# Patient Record
Sex: Female | Born: 1935 | Race: Black or African American | Hispanic: No | Marital: Married | Smoking: Never smoker
Health system: Southern US, Community
[De-identification: ages and names within clinical notes are randomized; demographics above are authoritative.]

## PROBLEM LIST (undated history)

## (undated) DIAGNOSIS — K219 Gastro-esophageal reflux disease without esophagitis: Secondary | ICD-10-CM

## (undated) DIAGNOSIS — N118 Other chronic tubulo-interstitial nephritis: Secondary | ICD-10-CM

## (undated) DIAGNOSIS — D801 Nonfamilial hypogammaglobulinemia: Secondary | ICD-10-CM

## (undated) DIAGNOSIS — Z7901 Long term (current) use of anticoagulants: Secondary | ICD-10-CM

## (undated) DIAGNOSIS — E7800 Pure hypercholesterolemia, unspecified: Secondary | ICD-10-CM

## (undated) DIAGNOSIS — J45909 Unspecified asthma, uncomplicated: Secondary | ICD-10-CM

## (undated) DIAGNOSIS — I10 Essential (primary) hypertension: Secondary | ICD-10-CM

## (undated) DIAGNOSIS — I4891 Unspecified atrial fibrillation: Secondary | ICD-10-CM

## (undated) DIAGNOSIS — Z86010 Personal history of colonic polyps: Secondary | ICD-10-CM

## (undated) HISTORY — PX: ABDOMINAL HYSTERECTOMY: SHX81

## (undated) HISTORY — PX: NEPHRECTOMY: SHX65

## (undated) HISTORY — DX: Other chronic tubulo-interstitial nephritis: N11.8

## (undated) HISTORY — DX: Nonfamilial hypogammaglobulinemia: D80.1

## (undated) HISTORY — DX: Pure hypercholesterolemia, unspecified: E78.00

## (undated) HISTORY — DX: Unspecified asthma, uncomplicated: J45.909

## (undated) HISTORY — DX: Essential (primary) hypertension: I10

## (undated) HISTORY — DX: Personal history of colonic polyps: Z86.010

## (undated) HISTORY — DX: Gastro-esophageal reflux disease without esophagitis: K21.9

## (undated) HISTORY — DX: Unspecified atrial fibrillation: I48.91

---

## 2004-08-29 ENCOUNTER — Ambulatory Visit: Payer: Self-pay | Admitting: Internal Medicine

## 2004-09-07 ENCOUNTER — Inpatient Hospital Stay: Payer: Self-pay | Admitting: Internal Medicine

## 2004-09-17 ENCOUNTER — Ambulatory Visit: Payer: Self-pay | Admitting: Internal Medicine

## 2004-09-19 ENCOUNTER — Ambulatory Visit: Payer: Self-pay | Admitting: Internal Medicine

## 2004-10-02 ENCOUNTER — Ambulatory Visit: Payer: Self-pay | Admitting: Urology

## 2004-10-10 ENCOUNTER — Ambulatory Visit: Payer: Self-pay | Admitting: Urology

## 2004-10-16 ENCOUNTER — Other Ambulatory Visit: Payer: Self-pay

## 2004-10-19 ENCOUNTER — Ambulatory Visit: Payer: Self-pay | Admitting: Internal Medicine

## 2004-10-20 ENCOUNTER — Inpatient Hospital Stay: Payer: Self-pay | Admitting: Urology

## 2004-11-19 ENCOUNTER — Ambulatory Visit: Payer: Self-pay | Admitting: Internal Medicine

## 2004-12-19 ENCOUNTER — Ambulatory Visit: Payer: Self-pay | Admitting: Internal Medicine

## 2005-01-19 ENCOUNTER — Ambulatory Visit: Payer: Self-pay | Admitting: Internal Medicine

## 2005-02-19 ENCOUNTER — Ambulatory Visit: Payer: Self-pay | Admitting: Internal Medicine

## 2005-04-06 ENCOUNTER — Ambulatory Visit: Payer: Self-pay | Admitting: Internal Medicine

## 2005-04-09 ENCOUNTER — Ambulatory Visit: Payer: Self-pay | Admitting: Internal Medicine

## 2005-06-05 ENCOUNTER — Ambulatory Visit: Payer: Self-pay | Admitting: Internal Medicine

## 2005-09-30 ENCOUNTER — Ambulatory Visit: Payer: Self-pay | Admitting: Internal Medicine

## 2005-10-19 ENCOUNTER — Ambulatory Visit: Payer: Self-pay | Admitting: Internal Medicine

## 2006-04-14 ENCOUNTER — Ambulatory Visit: Payer: Self-pay | Admitting: Internal Medicine

## 2007-04-21 ENCOUNTER — Ambulatory Visit: Payer: Self-pay | Admitting: Internal Medicine

## 2008-05-16 ENCOUNTER — Ambulatory Visit: Payer: Self-pay | Admitting: Internal Medicine

## 2009-06-06 ENCOUNTER — Inpatient Hospital Stay: Payer: Self-pay | Admitting: Internal Medicine

## 2009-06-19 ENCOUNTER — Ambulatory Visit: Payer: Self-pay | Admitting: Internal Medicine

## 2009-06-27 ENCOUNTER — Ambulatory Visit: Payer: Self-pay | Admitting: Internal Medicine

## 2009-07-08 ENCOUNTER — Ambulatory Visit: Payer: Self-pay | Admitting: Internal Medicine

## 2009-07-09 ENCOUNTER — Ambulatory Visit: Payer: Self-pay | Admitting: Internal Medicine

## 2009-07-19 ENCOUNTER — Ambulatory Visit: Payer: Self-pay | Admitting: Internal Medicine

## 2009-08-19 ENCOUNTER — Ambulatory Visit: Payer: Self-pay | Admitting: Internal Medicine

## 2009-09-19 ENCOUNTER — Ambulatory Visit: Payer: Self-pay | Admitting: Internal Medicine

## 2009-11-07 ENCOUNTER — Ambulatory Visit: Payer: Self-pay | Admitting: Internal Medicine

## 2009-11-19 ENCOUNTER — Ambulatory Visit: Payer: Self-pay | Admitting: Internal Medicine

## 2010-01-02 ENCOUNTER — Ambulatory Visit: Payer: Self-pay | Admitting: Internal Medicine

## 2010-01-19 ENCOUNTER — Ambulatory Visit: Payer: Self-pay | Admitting: Internal Medicine

## 2010-02-28 ENCOUNTER — Ambulatory Visit: Payer: Self-pay | Admitting: Internal Medicine

## 2010-03-20 ENCOUNTER — Ambulatory Visit: Payer: Self-pay | Admitting: Internal Medicine

## 2010-03-26 ENCOUNTER — Inpatient Hospital Stay: Payer: Self-pay | Admitting: Internal Medicine

## 2010-07-03 ENCOUNTER — Ambulatory Visit: Payer: Self-pay | Admitting: Internal Medicine

## 2010-08-19 ENCOUNTER — Ambulatory Visit: Payer: Self-pay | Admitting: Internal Medicine

## 2010-11-05 ENCOUNTER — Ambulatory Visit: Payer: Self-pay | Admitting: Internal Medicine

## 2011-08-26 ENCOUNTER — Ambulatory Visit: Payer: Self-pay | Admitting: Internal Medicine

## 2011-11-18 ENCOUNTER — Telehealth: Payer: Self-pay | Admitting: Internal Medicine

## 2011-11-18 NOTE — Telephone Encounter (Signed)
Refill request for proair hfa 90 mcg inhaler 8.5 gm eight and five tenths Sig: inhale 2 puffs every 6 hours as needed Patient has an appt. On 12/20

## 2011-11-20 NOTE — Telephone Encounter (Signed)
Dr. Lorin Picket called in to pharmacy

## 2011-11-24 ENCOUNTER — Other Ambulatory Visit: Payer: Self-pay | Admitting: *Deleted

## 2011-11-24 MED ORDER — ALBUTEROL SULFATE HFA 108 (90 BASE) MCG/ACT IN AERS: 2.0000 | INHALATION_SPRAY | Freq: Four times a day (QID) | RESPIRATORY_TRACT | Status: DC | PRN

## 2011-11-26 ENCOUNTER — Other Ambulatory Visit: Payer: Self-pay | Admitting: *Deleted

## 2011-11-26 NOTE — Telephone Encounter (Signed)
Called in Script for [employer] Inhaler/90 mcg per pharmacy

## 2011-12-02 NOTE — Telephone Encounter (Signed)
Encounter opened in error

## 2011-12-25 LAB — PROTIME-INR

## 2012-01-06 ENCOUNTER — Telehealth: Payer: Self-pay | Admitting: Internal Medicine

## 2012-01-06 NOTE — Telephone Encounter (Signed)
Montelukast SOD 10 mg tab Take 1 tablet by mouth every day #30

## 2012-01-08 ENCOUNTER — Ambulatory Visit (INDEPENDENT_AMBULATORY_CARE_PROVIDER_SITE_OTHER): Payer: PRIVATE HEALTH INSURANCE | Admitting: Internal Medicine

## 2012-01-08 ENCOUNTER — Encounter: Payer: Self-pay | Admitting: Internal Medicine

## 2012-01-08 VITALS — BP 148/78 | HR 77 | Temp 97.7°F | Ht 60.0 in | Wt 152.5 lb

## 2012-01-08 DIAGNOSIS — I4891 Unspecified atrial fibrillation: Secondary | ICD-10-CM

## 2012-01-08 DIAGNOSIS — J45909 Unspecified asthma, uncomplicated: Secondary | ICD-10-CM | POA: Insufficient documentation

## 2012-01-08 DIAGNOSIS — N289 Disorder of kidney and ureter, unspecified: Secondary | ICD-10-CM

## 2012-01-08 DIAGNOSIS — D751 Secondary polycythemia: Secondary | ICD-10-CM

## 2012-01-08 DIAGNOSIS — N184 Chronic kidney disease, stage 4 (severe): Secondary | ICD-10-CM | POA: Insufficient documentation

## 2012-01-08 DIAGNOSIS — E010 Iodine-deficiency related diffuse (endemic) goiter: Secondary | ICD-10-CM

## 2012-01-08 DIAGNOSIS — N058 Unspecified nephritic syndrome with other morphologic changes: Secondary | ICD-10-CM

## 2012-01-08 DIAGNOSIS — E1121 Type 2 diabetes mellitus with diabetic nephropathy: Secondary | ICD-10-CM

## 2012-01-08 DIAGNOSIS — E1129 Type 2 diabetes mellitus with other diabetic kidney complication: Secondary | ICD-10-CM

## 2012-01-08 DIAGNOSIS — E7800 Pure hypercholesterolemia, unspecified: Secondary | ICD-10-CM

## 2012-01-08 DIAGNOSIS — I10 Essential (primary) hypertension: Secondary | ICD-10-CM

## 2012-01-08 DIAGNOSIS — D45 Polycythemia vera: Secondary | ICD-10-CM

## 2012-01-08 DIAGNOSIS — E049 Nontoxic goiter, unspecified: Secondary | ICD-10-CM

## 2012-01-08 NOTE — Telephone Encounter (Signed)
Called in to pharmacy. 30 with 2/refills

## 2012-01-08 NOTE — Patient Instructions (Signed)
It was nice seeing you today.  I am glad you have been doing well.  Let me know if you need anything. 

## 2012-01-09 ENCOUNTER — Encounter: Payer: Self-pay | Admitting: Internal Medicine

## 2012-01-09 NOTE — Assessment & Plan Note (Signed)
Appears to be in sinus rhythm today.  Sees Dr Fath.  Currently asymptomatic.  Follow.      

## 2012-01-09 NOTE — Assessment & Plan Note (Signed)
Sugars (per her report) as outlined.  Same meds.  Follow met b and a1c.

## 2012-01-09 NOTE — Assessment & Plan Note (Signed)
Was followed by Dr Yabanez.  Had intermittent phlebotomy.  Follow.   

## 2012-01-09 NOTE — Progress Notes (Signed)
Subjective:    Patient ID: Joan Erickson, female    DOB: 1931/04/30, 78 y.o.   MRN: [id number]  HPI 78 year old female with past history of asthma, hypertension, hyperglycemia, atrial fibrillation and is s/p right nephrectomy for xanthogranulomatous pyelonephritis who comes in today for a scheduled follow up.  She states she has been walking and feels better.  Blood pressure has been doing well.  Just saw nephrology.  Renal function stable.  No recorded sugar readings.  Blood sugar in the am - 130s.  Blood pressures averaging 107-120s/60s.  No cardiac symptoms with increased activity or exertion.  Breathing stable.  Got her flu shot.  Past Medical History  Diagnosis Date  . Hypertension   . Asthma   . GERD (gastroesophageal reflux disease)   . Hypercholesterolemia   . History of colon polyps   . Xanthogranulomatous pyelonephritis     s/p right nephrectomy  . Hypogammaglobulinemia     IgA  . Atrial fibrillation     Current Outpatient Prescriptions on File Prior to Visit  Medication Sig Dispense Refill  . albuterol (PROVENTIL HFA;VENTOLIN HFA) 108 (90 BASE) MCG/ACT inhaler Inhale 2 puffs into the lungs every 6 (six) hours as needed.  8.5 g  1  . chlorthalidone (HYGROTON) 25 MG tablet Take 25 mg by mouth daily.      . digoxin (LANOXIN) 0.125 MG tablet Take 0.125 mg by mouth daily.      Marland Kitchen diltiazem (TIAZAC) 360 MG 24 hr capsule Take 360 mg by mouth daily.      . fluticasone (FLONASE) 50 MCG/ACT nasal spray Place 2 sprays into the nose daily.      Marland Kitchen glimepiride (AMARYL) 2 MG tablet Take 1/2 tablet q day      . losartan (COZAAR) 100 MG tablet Take 100 mg by mouth daily.      . montelukast (SINGULAIR) 10 MG tablet Take 10 mg by mouth at bedtime.      . pravastatin (PRAVACHOL) 20 MG tablet Take 20 mg by mouth daily.      Marland Kitchen warfarin (COUMADIN) 1 MG tablet Take 1 mg by mouth as directed.      Marland Kitchen estradiol (VIVELLE-DOT) 0.05 MG/24HR Place 1 patch onto the skin once a week.      . ferrous sulfate  325 (65 FE) MG tablet Take 325 mg by mouth 2 (two) times daily.      . Fluticasone-Salmeterol (ADVAIR) 250-50 MCG/DOSE AEPB Inhale 1 puff into the lungs every 12 (twelve) hours.      . hydrochlorothiazide (HYDRODIURIL) 25 MG tablet Take 1/2 tablet q day        Review of Systems Patient denies any headache, lightheadedness or dizziness.  No sinus or allergy symptoms.  No chest pain, tightness or palpitations.  No increased shortness of breath, cough or congestion.  No nausea or vomiting.  No abdominal pain or cramping.  No bowel change, such as diarrhea, constipation, BRBPR or melana.  No urine change.        Objective:   Physical Exam Filed Vitals:   01/08/12 0946  BP: 148/78  Pulse: 77  Temp: 97.7 F (41.8 C)   78 year old female in no acute distress.   HEENT:  Nares - clear.  OP- without lesions or erythema.  NECK:  Supple, nontender.  No audible  bruit.   HEART:  Appears to be regular. LUNGS:  Without crackles or wheezing audible.  Respirations even and unlabored.   RADIAL PULSE:  Equal  bilaterally.  ABDOMEN:  Soft, nontender.  No audible abdominal bruit.   EXTREMITIES:  No increased edema to be present.                     Assessment & Plan:  LEFT UPPER EYELID LESION.  Will make an appt with her dermatologist in [facility].    HEALTH MAINTENANCE.  Physical 03/23/11.  She is s/p hysterectomy.  Colonoscopy 07/16/06 revealed sessile polyp that was removed.  Recommended repeat in five years.  Had had mammogram this summer.  Obtain results.

## 2012-01-09 NOTE — Assessment & Plan Note (Signed)
Blood pressure has been doing better.  Same meds.  Follow.   

## 2012-01-09 NOTE — Assessment & Plan Note (Signed)
Breathing stable. Continue inhalers.  Follow.    

## 2012-01-09 NOTE — Assessment & Plan Note (Signed)
Saw Dr Renae Fickle.  Biopsy negative.

## 2012-01-09 NOTE — Assessment & Plan Note (Signed)
On pravastatin.  Low cholesterol diet and exercise.  Follow.  Check lipid panel and liver function.

## 2012-01-09 NOTE — Assessment & Plan Note (Signed)
Creatinine stable at 2.1.  Continues to follow up with nephrology.

## 2012-01-11 ENCOUNTER — Telehealth: Payer: Self-pay | Admitting: Internal Medicine

## 2012-01-11 MED ORDER — PRAVASTATIN SODIUM 20 MG PO TABS: 20.0000 mg | ORAL_TABLET | Freq: Every day | ORAL | Status: DC

## 2012-01-11 NOTE — Telephone Encounter (Signed)
Sent in to pharmacy.  

## 2012-01-11 NOTE — Telephone Encounter (Signed)
Montelukast SOD 10 mg Tablet  Take 1 tablet by mouth every day  # 30

## 2012-01-11 NOTE — Telephone Encounter (Signed)
Pravastatin Sodium 20 mg tab  Take 1 tablet by mouth at bedtime  # 30

## 2012-01-12 MED ORDER — MONTELUKAST SODIUM 10 MG PO TABS: 10.0000 mg | ORAL_TABLET | Freq: Every day | ORAL | Status: DC

## 2012-01-12 NOTE — Telephone Encounter (Signed)
Sent in to pharmacy.  

## 2012-02-16 LAB — PROTIME-INR

## 2012-02-29 ENCOUNTER — Other Ambulatory Visit: Payer: Self-pay | Admitting: *Deleted

## 2012-02-29 MED ORDER — DILTIAZEM HCL ER BEADS 360 MG PO CP24: 360.0000 mg | ORAL_CAPSULE | Freq: Every day | ORAL | Status: DC

## 2012-02-29 NOTE — Telephone Encounter (Signed)
Eprescribed.

## 2012-03-02 ENCOUNTER — Telehealth: Payer: Self-pay | Admitting: Internal Medicine

## 2012-03-02 NOTE — Telephone Encounter (Signed)
Pt would like a call back from Dr. Lorin Picket or nurse regarding going back to [employer] ??

## 2012-03-07 NOTE — Telephone Encounter (Signed)
Left message on answering machine to call back.

## 2012-03-08 NOTE — Telephone Encounter (Signed)
Yes.  I think this sounds like a good idea.

## 2012-03-08 NOTE — Telephone Encounter (Signed)
Patient would like to know if you think it will be all right for her to start going to [employer] to exercise again?

## 2012-03-08 NOTE — Telephone Encounter (Signed)
Pt is calling you back and wanted you to call her

## 2012-03-08 NOTE — Telephone Encounter (Signed)
Patient notified

## 2012-03-16 ENCOUNTER — Other Ambulatory Visit: Payer: Self-pay | Admitting: *Deleted

## 2012-03-16 MED ORDER — GLIMEPIRIDE 2 MG PO TABS: ORAL_TABLET | ORAL | Status: DC

## 2012-03-16 NOTE — Telephone Encounter (Signed)
Sent in to pharmacy.  

## 2012-03-17 ENCOUNTER — Other Ambulatory Visit: Payer: Self-pay | Admitting: *Deleted

## 2012-03-17 MED ORDER — ESTRADIOL 0.05 MG/24HR TD PTTW: 1.0000 | MEDICATED_PATCH | TRANSDERMAL | Status: DC

## 2012-03-17 NOTE — Telephone Encounter (Signed)
Sent in to pharmacy.  

## 2012-03-22 ENCOUNTER — Telehealth: Payer: Self-pay | Admitting: Internal Medicine

## 2012-03-22 MED ORDER — FLUTICASONE PROPIONATE 50 MCG/ACT NA SUSP: 2.0000 | Freq: Every day | NASAL | Status: DC

## 2012-03-22 MED ORDER — ALBUTEROL SULFATE HFA 108 (90 BASE) MCG/ACT IN AERS: 2.0000 | INHALATION_SPRAY | Freq: Four times a day (QID) | RESPIRATORY_TRACT | Status: DC | PRN

## 2012-03-22 NOTE — Telephone Encounter (Signed)
Refills sent in for flonase and proair

## 2012-03-23 ENCOUNTER — Other Ambulatory Visit: Payer: Self-pay | Admitting: *Deleted

## 2012-03-23 MED ORDER — CHLORTHALIDONE 25 MG PO TABS: 25.0000 mg | ORAL_TABLET | Freq: Every day | ORAL | Status: DC

## 2012-03-23 NOTE — Telephone Encounter (Signed)
Sent in to pharmacy.  

## 2012-03-31 ENCOUNTER — Other Ambulatory Visit: Payer: PRIVATE HEALTH INSURANCE

## 2012-04-04 ENCOUNTER — Other Ambulatory Visit (INDEPENDENT_AMBULATORY_CARE_PROVIDER_SITE_OTHER): Payer: PRIVATE HEALTH INSURANCE

## 2012-04-04 DIAGNOSIS — E1121 Type 2 diabetes mellitus with diabetic nephropathy: Secondary | ICD-10-CM

## 2012-04-04 DIAGNOSIS — I4891 Unspecified atrial fibrillation: Secondary | ICD-10-CM

## 2012-04-04 DIAGNOSIS — I10 Essential (primary) hypertension: Secondary | ICD-10-CM

## 2012-04-04 DIAGNOSIS — E010 Iodine-deficiency related diffuse (endemic) goiter: Secondary | ICD-10-CM

## 2012-04-04 DIAGNOSIS — E7800 Pure hypercholesterolemia, unspecified: Secondary | ICD-10-CM

## 2012-04-04 DIAGNOSIS — E1129 Type 2 diabetes mellitus with other diabetic kidney complication: Secondary | ICD-10-CM

## 2012-04-04 LAB — CBC WITH DIFFERENTIAL/PLATELET
Basophils Absolute: 0 10*3/uL (ref 0.0–0.1)
Eosinophils Absolute: 0.1 10*3/uL (ref 0.0–0.7)
HCT: 37 % (ref 36.0–46.0)
Hemoglobin: 12.7 g/dL (ref 12.0–15.0)
Lymphs Abs: 1 10*3/uL (ref 0.7–4.0)
MCHC: 34.2 g/dL (ref 30.0–36.0)
Neutro Abs: 5 10*3/uL (ref 1.4–7.7)
RDW: 13.8 % (ref 11.5–14.6)

## 2012-04-04 LAB — BASIC METABOLIC PANEL
BUN: 43 mg/dL — ABNORMAL HIGH (ref 6–23)
CO2: 26 mEq/L (ref 19–32)
Calcium: 9.5 mg/dL (ref 8.4–10.5)
Glucose, Bld: 238 mg/dL — ABNORMAL HIGH (ref 70–99)
Potassium: 4.4 mEq/L (ref 3.5–5.1)
Sodium: 134 mEq/L — ABNORMAL LOW (ref 135–145)

## 2012-04-04 LAB — HEPATIC FUNCTION PANEL
ALT: 24 U/L (ref 0–35)
Bilirubin, Direct: 0.1 mg/dL (ref 0.0–0.3)
Total Bilirubin: 0.5 mg/dL (ref 0.3–1.2)

## 2012-04-04 LAB — HEMOGLOBIN A1C: Hgb A1c MFr Bld: 9.4 % — ABNORMAL HIGH (ref 4.6–6.5)

## 2012-04-04 LAB — TSH: TSH: 2.68 u[IU]/mL (ref 0.35–5.50)

## 2012-04-06 ENCOUNTER — Encounter: Payer: Self-pay | Admitting: Internal Medicine

## 2012-04-07 ENCOUNTER — Ambulatory Visit (INDEPENDENT_AMBULATORY_CARE_PROVIDER_SITE_OTHER): Payer: Medicare Other | Admitting: Internal Medicine

## 2012-04-07 ENCOUNTER — Encounter: Payer: Self-pay | Admitting: Internal Medicine

## 2012-04-07 VITALS — BP 170/70 | HR 81 | Temp 98.0°F | Ht 60.0 in | Wt 155.8 lb

## 2012-04-07 DIAGNOSIS — Z1211 Encounter for screening for malignant neoplasm of colon: Secondary | ICD-10-CM

## 2012-04-07 DIAGNOSIS — E1121 Type 2 diabetes mellitus with diabetic nephropathy: Secondary | ICD-10-CM

## 2012-04-07 DIAGNOSIS — J45909 Unspecified asthma, uncomplicated: Secondary | ICD-10-CM

## 2012-04-07 DIAGNOSIS — D751 Secondary polycythemia: Secondary | ICD-10-CM

## 2012-04-07 DIAGNOSIS — E010 Iodine-deficiency related diffuse (endemic) goiter: Secondary | ICD-10-CM

## 2012-04-07 DIAGNOSIS — L989 Disorder of the skin and subcutaneous tissue, unspecified: Secondary | ICD-10-CM

## 2012-04-07 DIAGNOSIS — N289 Disorder of kidney and ureter, unspecified: Secondary | ICD-10-CM

## 2012-04-07 DIAGNOSIS — E7800 Pure hypercholesterolemia, unspecified: Secondary | ICD-10-CM

## 2012-04-07 DIAGNOSIS — I10 Essential (primary) hypertension: Secondary | ICD-10-CM

## 2012-04-07 DIAGNOSIS — I4891 Unspecified atrial fibrillation: Secondary | ICD-10-CM

## 2012-04-07 DIAGNOSIS — E871 Hypo-osmolality and hyponatremia: Secondary | ICD-10-CM

## 2012-04-07 LAB — BASIC METABOLIC PANEL
CO2: 27 mEq/L (ref 19–32)
Calcium: 9.7 mg/dL (ref 8.4–10.5)
Chloride: 103 mEq/L (ref 96–112)
Creatinine, Ser: 2.6 mg/dL — ABNORMAL HIGH (ref 0.4–1.2)
Sodium: 136 mEq/L (ref 135–145)

## 2012-04-07 MED ORDER — GLIMEPIRIDE 2 MG PO TABS: 2.0000 mg | ORAL_TABLET | Freq: Every day | ORAL | Status: DC

## 2012-04-07 NOTE — Progress Notes (Signed)
Subjective:    Patient ID: Joan Erickson, female    DOB: 1932/01/19, 79 y.o.   MRN: [id number]  HPI 79 year old female with past history of asthma, hypertension, hyperglycemia, atrial fibrillation and is s/p right nephrectomy for xanthogranulomatous pyelonephritis who comes in today to follow up on these issues as well as for a complete physical exam.  She states she is doing well and feels good.  She has not been exercising regularly and has not bee watching her diet.  Plans to start exercising more.  Plans to return to [employer].  Also plans to start watching her diet better.  Her husband is doing better.  Stress is better.  Still following with nephrology.  Has follow up planned 4/14.  Has not been checking her sugars regularly.  A1c just checked - 9.4.  On amaryl and this was previously controlling.  No problems with lows.  Good appetite.  Had follow up with Dr Lady Gary 03/28/12.  States everything checked out fine.  Due follow up in 6 months.     Past Medical History  Diagnosis Date  . Hypertension   . Asthma   . GERD (gastroesophageal reflux disease)   . Hypercholesterolemia   . History of colon polyps   . Xanthogranulomatous pyelonephritis     s/p right nephrectomy  . Hypogammaglobulinemia     IgA  . Atrial fibrillation     Current Outpatient Prescriptions on File Prior to Visit  Medication Sig Dispense Refill  . albuterol (PROVENTIL HFA;VENTOLIN HFA) 108 (90 BASE) MCG/ACT inhaler Inhale 2 puffs into the lungs every 6 (six) hours as needed for wheezing.  1 Inhaler  5  . chlorthalidone (HYGROTON) 25 MG tablet Take 1 tablet (25 mg total) by mouth daily.  30 tablet  5  . Cholecalciferol (VITAMIN D) 2000 UNITS tablet Take 2,000 Units by mouth daily.      . digoxin (LANOXIN) 0.125 MG tablet Take 0.125 mg by mouth daily.      Marland Kitchen diltiazem (TIAZAC) 360 MG 24 hr capsule Take 1 capsule (360 mg total) by mouth daily.  30 capsule  5  . estradiol (VIVELLE-DOT) 0.05 MG/24HR Place 1 patch (0.05 mg total)  onto the skin once a week.  8 patch  1  . fluticasone (FLONASE) 50 MCG/ACT nasal spray Place 2 sprays into the nose daily.  16 g  5  . Fluticasone-Salmeterol (ADVAIR) 250-50 MCG/DOSE AEPB Inhale 1 puff into the lungs every 12 (twelve) hours.      Marland Kitchen glimepiride (AMARYL) 2 MG tablet Take 1/2 tablet q day  15 tablet  5  . losartan (COZAAR) 100 MG tablet Take 100 mg by mouth daily.      . montelukast (SINGULAIR) 10 MG tablet Take 1 tablet (10 mg total) by mouth at bedtime.  30 tablet  5  . pravastatin (PRAVACHOL) 20 MG tablet Take 1 tablet (20 mg total) by mouth daily.  30 tablet  2  . warfarin (COUMADIN) 1 MG tablet Take 1 mg by mouth as directed.      . warfarin (COUMADIN) 5 MG tablet As directed      . ferrous sulfate 325 (65 FE) MG tablet Take 325 mg by mouth 2 (two) times daily.      . hydrochlorothiazide (HYDRODIURIL) 25 MG tablet Take 1/2 tablet q day       No current facility-administered medications on file prior to visit.    Review of Systems Patient denies any headache, lightheadedness or dizziness.  No  sinus or allergy symptoms.  No chest pain, tightness or palpitations.  No increased heart rate.  No increased shortness of breath, cough or congestion.  She feels her breathing is doing well.  No nausea or vomiting.  No abdominal pain or cramping.  No bowel change, such as diarrhea, constipation, BRBPR or melana.  No urine change.        Objective:   Physical Exam  Filed Vitals:   04/07/12 0857  BP: 170/70  Pulse: 81  Temp: 98 F (38.34 C)   79 year old female in no acute distress.   HEENT:  Nares- clear.  Oropharynx - without lesions. NECK:  Supple.  Nontender.  No audible bruit.  HEART:  Appears to be regular. LUNGS:  No crackles or wheezing audible.  Respirations even and unlabored.  RADIAL PULSE:  Equal bilaterally.    BREASTS:  No nipple discharge or nipple retraction present.  Could not appreciate any distinct nodules or axillary adenopathy.  ABDOMEN:  Soft, nontender.   Bowel sounds present and normal.  No audible abdominal bruit.  GU:  She declined.     EXTREMITIES:  No increased edema present.  DP pulses palpable and equal bilaterally.  SKIN:  No lesions - feet.  Persistent eye lesion and facial lesion.             Assessment & Plan:  DERM.  Persistent eye lesion and facial lesion.  Refer to dermatology for evaluation.    HEALTH MAINTENANCE.  Physical today.  She is s/p hysterectomy.  She declined pelvic. Colonoscopy 07/16/06 revealed sessile polyp that was removed.  Recommended repeat in five years.  Overdue.  Schedule an appt with GI.   Mammogram 08/26/11 - BiRADS I.   Schedule a follow up mammogram when due.

## 2012-04-10 ENCOUNTER — Encounter: Payer: Self-pay | Admitting: Internal Medicine

## 2012-04-10 NOTE — Assessment & Plan Note (Signed)
Breathing stable. Continue inhalers.  Follow.    

## 2012-04-10 NOTE — Assessment & Plan Note (Signed)
Not checking sugars.  Not watching her diet and exercising.  Plans to start exercising again and plans to get more serious about her diet.  Increase amaryl to 2mg  q day.  Check and record sugars at least bid.  Get her back in soon to reassess.  Follow.

## 2012-04-10 NOTE — Assessment & Plan Note (Signed)
Appears to be in sinus rhythm today.  Sees Dr Fath.  Currently asymptomatic.  Follow.      

## 2012-04-10 NOTE — Assessment & Plan Note (Signed)
Was followed by Dr Yabanez.  Had intermittent phlebotomy.  Follow.   

## 2012-04-10 NOTE — Assessment & Plan Note (Signed)
Creatinine had been stable at 2.1.  A little more elevated with the latest labs.  Recheck.  Continues to follow up with nephrology.

## 2012-04-10 NOTE — Assessment & Plan Note (Signed)
On pravastatin.  Low cholesterol diet and exercise.  Follow.  Follow lipid panel and liver function.    

## 2012-04-10 NOTE — Assessment & Plan Note (Signed)
Blood pressure has been doing better.  Initially elevated today.  On recheck improved, but still elevated.  (146/68 on recheck).  Same meds.  Follow.  Have her spot check her pressure and get her back in soon to reassess.

## 2012-04-10 NOTE — Assessment & Plan Note (Signed)
Saw Dr Paul.  Biopsy negative.  Was last evaluated by Dr Paul - 07/02/11.  Had follow up ultrasound.  Had recommended a one year follow up if everything stable.         

## 2012-04-15 ENCOUNTER — Other Ambulatory Visit: Payer: Self-pay | Admitting: *Deleted

## 2012-04-15 MED ORDER — PRAVASTATIN SODIUM 20 MG PO TABS: 20.0000 mg | ORAL_TABLET | Freq: Every day | ORAL | Status: DC

## 2012-04-15 NOTE — Telephone Encounter (Signed)
Rx sent in to pharmacy. 

## 2012-05-03 ENCOUNTER — Encounter: Payer: Self-pay | Admitting: Internal Medicine

## 2012-05-03 ENCOUNTER — Ambulatory Visit (INDEPENDENT_AMBULATORY_CARE_PROVIDER_SITE_OTHER): Payer: Medicare Other | Admitting: Internal Medicine

## 2012-05-03 VITALS — BP 150/90 | HR 83 | Temp 98.2°F | Wt 154.5 lb

## 2012-05-03 DIAGNOSIS — J45909 Unspecified asthma, uncomplicated: Secondary | ICD-10-CM

## 2012-05-03 DIAGNOSIS — E1129 Type 2 diabetes mellitus with other diabetic kidney complication: Secondary | ICD-10-CM

## 2012-05-03 DIAGNOSIS — E049 Nontoxic goiter, unspecified: Secondary | ICD-10-CM

## 2012-05-03 DIAGNOSIS — E7800 Pure hypercholesterolemia, unspecified: Secondary | ICD-10-CM

## 2012-05-03 DIAGNOSIS — D45 Polycythemia vera: Secondary | ICD-10-CM

## 2012-05-03 DIAGNOSIS — N289 Disorder of kidney and ureter, unspecified: Secondary | ICD-10-CM

## 2012-05-03 DIAGNOSIS — D751 Secondary polycythemia: Secondary | ICD-10-CM

## 2012-05-03 DIAGNOSIS — I4891 Unspecified atrial fibrillation: Secondary | ICD-10-CM

## 2012-05-03 DIAGNOSIS — E010 Iodine-deficiency related diffuse (endemic) goiter: Secondary | ICD-10-CM

## 2012-05-03 DIAGNOSIS — I10 Essential (primary) hypertension: Secondary | ICD-10-CM

## 2012-05-03 DIAGNOSIS — E1121 Type 2 diabetes mellitus with diabetic nephropathy: Secondary | ICD-10-CM

## 2012-05-03 DIAGNOSIS — N058 Unspecified nephritic syndrome with other morphologic changes: Secondary | ICD-10-CM

## 2012-05-08 ENCOUNTER — Encounter: Payer: Self-pay | Admitting: Internal Medicine

## 2012-05-08 NOTE — Assessment & Plan Note (Signed)
Blood pressure has been doing better.  Same meds.  Follow.  Have her spot check her pressure and get her back in soon to reassess.         

## 2012-05-08 NOTE — Assessment & Plan Note (Signed)
Appears to be in sinus rhythm today.  Sees Dr Fath.  Currently asymptomatic.  Follow.      

## 2012-05-08 NOTE — Assessment & Plan Note (Signed)
Breathing stable. Continue inhalers.  Follow.    

## 2012-05-08 NOTE — Assessment & Plan Note (Signed)
Saw Dr Paul.  Biopsy negative.  Was last evaluated by Dr Paul - 07/02/11.  Had follow up ultrasound.  Had recommended a one year follow up if everything stable.         

## 2012-05-08 NOTE — Assessment & Plan Note (Signed)
Was followed by Dr Yabanez.  Had intermittent phlebotomy.  Follow.   

## 2012-05-08 NOTE — Progress Notes (Signed)
Subjective:    Patient ID: Joan Erickson, female    DOB: 1931-11-04, 79 y.o.   MRN: [id number]  HPI 79 year old female with past history of asthma, hypertension, hyperglycemia, atrial fibrillation and is s/p right nephrectomy for xanthogranulomatous pyelonephritis who comes in today for a scheduled follow up.   She states she is doing well and feels good.  She is now back to exercising regularly.  Had not been watching what she eats.  Has started again monitoring.  Back at [employer].  Her husband is doing better.  Stress is better.  Still following with nephrology.  Has follow up planned later this month.  Has not been checking her sugars regularly.  Brought in no recorded readings.   A1c just checked - 9.4.  On amaryl and this was previously controlling.  No problems with lows.  Good appetite.  Had follow up with Dr Lady Gary 03/28/12.  States everything checked out fine.  Due follow up in 6 months.     Past Medical History  Diagnosis Date  . Hypertension   . Asthma   . GERD (gastroesophageal reflux disease)   . Hypercholesterolemia   . History of colon polyps   . Xanthogranulomatous pyelonephritis     s/p right nephrectomy  . Hypogammaglobulinemia     IgA  . Atrial fibrillation     Current Outpatient Prescriptions on File Prior to Visit  Medication Sig Dispense Refill  . albuterol (PROVENTIL HFA;VENTOLIN HFA) 108 (90 BASE) MCG/ACT inhaler Inhale 2 puffs into the lungs every 6 (six) hours as needed for wheezing.  1 Inhaler  5  . chlorthalidone (HYGROTON) 25 MG tablet Take 1 tablet (25 mg total) by mouth daily.  30 tablet  5  . Cholecalciferol (VITAMIN D) 2000 UNITS tablet Take 2,000 Units by mouth daily.      . digoxin (LANOXIN) 0.125 MG tablet Take 0.125 mg by mouth daily.      Marland Kitchen diltiazem (TIAZAC) 360 MG 24 hr capsule Take 1 capsule (360 mg total) by mouth daily.  30 capsule  5  . estradiol (VIVELLE-DOT) 0.05 MG/24HR Place 1 patch (0.05 mg total) onto the skin once a week.  8 patch  1  .  ferrous sulfate 325 (65 FE) MG tablet Take 325 mg by mouth 2 (two) times daily.      . fluticasone (FLONASE) 50 MCG/ACT nasal spray Place 2 sprays into the nose daily.  16 g  5  . Fluticasone-Salmeterol (ADVAIR) 250-50 MCG/DOSE AEPB Inhale 1 puff into the lungs every 12 (twelve) hours.      Marland Kitchen glimepiride (AMARYL) 2 MG tablet Take 1 tablet (2 mg total) by mouth daily before breakfast.  30 tablet  5  . hydrochlorothiazide (HYDRODIURIL) 25 MG tablet Take 1/2 tablet q day      . losartan (COZAAR) 100 MG tablet Take 100 mg by mouth daily.      . montelukast (SINGULAIR) 10 MG tablet Take 1 tablet (10 mg total) by mouth at bedtime.  30 tablet  5  . pravastatin (PRAVACHOL) 20 MG tablet Take 1 tablet (20 mg total) by mouth daily.  30 tablet  5  . warfarin (COUMADIN) 1 MG tablet Take 1 mg by mouth as directed.      . warfarin (COUMADIN) 5 MG tablet As directed       No current facility-administered medications on file prior to visit.    Review of Systems Patient denies any headache, lightheadedness or dizziness.  No sinus or allergy  symptoms.  No chest pain, tightness or palpitations.  No increased heart rate.  No increased shortness of breath, cough or congestion.  She feels her breathing is doing well.  No nausea or vomiting.  No abdominal pain or cramping.  No bowel change, such as diarrhea, constipation, BRBPR or melana.  No urine change.  Brought in no recorded sugar readings.  Has started back exercising.      Objective:   Physical Exam  Filed Vitals:   05/03/12 0813  BP: 150/90  Pulse: 83  Temp: 98.2 F (73.70 C)   79 year old female in no acute distress.   HEENT:  Nares- clear.  Oropharynx - without lesions. NECK:  Supple.  Nontender.  No audible bruit.  HEART:  Appears to be regular. LUNGS:  No crackles or wheezing audible.  Respirations even and unlabored.  RADIAL PULSE:  Equal bilaterally.  ABDOMEN:  Soft, nontender.  Bowel sounds present and normal.  No audible abdominal bruit.      EXTREMITIES:  No increased edema present.  DP pulses palpable and equal bilaterally.  SKIN:  No lesions - feet.  Persistent eye lesion and facial lesion.             Assessment & Plan:  HEALTH MAINTENANCE.  Physical last visit.  She is s/p hysterectomy.  Colonoscopy 07/16/06 revealed sessile polyp that was removed.  Recommended repeat in five years.  Overdue.  Scheduled an appt with GI.   Mammogram 08/26/11 - BiRADS I.   Schedule a follow up mammogram when due.

## 2012-05-08 NOTE — Assessment & Plan Note (Signed)
On pravastatin.  Low cholesterol diet and exercise.  Follow.  Follow lipid panel and liver function.    

## 2012-05-08 NOTE — Assessment & Plan Note (Signed)
Creatinine had been stable at 2.1.  A little more elevated with the latest labs.  Last Cr 2.6.  Labs forwarded to nephrology.  Has follow up 05/16/12.  Avoid nephrotoxic agents.

## 2012-05-08 NOTE — Assessment & Plan Note (Addendum)
Brought in no recorded sugar readings.   Not watching her diet.  Back to exercising now.  Increased amaryl to 2mg  q day last visit.  Check and record sugars at least bid.  Get her back in soon to reassess.  Follow.  Just saw her optometrist.  Apparently had diabetic changes.  Continue follow up with optometry.

## 2012-05-09 ENCOUNTER — Telehealth: Payer: Self-pay | Admitting: Internal Medicine

## 2012-05-09 MED ORDER — DILTIAZEM HCL ER BEADS 360 MG PO CP24: 360.0000 mg | ORAL_CAPSULE | Freq: Every day | ORAL | Status: DC

## 2012-05-09 NOTE — Telephone Encounter (Signed)
Order sent in for diltiazem refill x 6

## 2012-05-11 ENCOUNTER — Other Ambulatory Visit: Payer: Self-pay | Admitting: Internal Medicine

## 2012-05-11 NOTE — Telephone Encounter (Signed)
Rx sent to pharmacy by escript  

## 2012-05-17 ENCOUNTER — Telehealth: Payer: Self-pay | Admitting: Internal Medicine

## 2012-05-17 MED ORDER — GLUCOSE BLOOD VI STRP: ORAL_STRIP | Status: DC

## 2012-05-17 NOTE — Telephone Encounter (Signed)
Pt called she needs rx for contour test strips [facility] [phone] Pt out of test strips

## 2012-05-17 NOTE — Telephone Encounter (Signed)
Contour test strips sent electronically to pharmacy ([facility])

## 2012-05-19 ENCOUNTER — Telehealth: Payer: Self-pay | Admitting: *Deleted

## 2012-05-19 NOTE — Telephone Encounter (Signed)
Pt wanted to Dr.Scott an update. She went to [facility] about her left "wet eye"-and it was taking care of the same day with a minor procedure. Doing much better now. Her weight is down & her sugar is better. She also has a colonoscopy scheduled for 5/8, but would like to know if she could have it done at [facility] instead. That's where she usually has them done (cant remember the doctors name right now)

## 2012-05-20 NOTE — Telephone Encounter (Signed)
Reviewed message.  Can keep [place] posted about her eye.  Regarding the colonoscopy, I am ok for her to have the colonoscopy wherever she prefers.  If she decides to go to [facility], she needs to get [place] the name or number to contact.

## 2012-05-20 NOTE — Telephone Encounter (Signed)
Spoke with pt & she will call [facility] and she will contact [facility] to find out the name of the doctor & contact number and give [place] a call back.

## 2012-06-01 ENCOUNTER — Ambulatory Visit: Payer: Medicare Other | Admitting: Internal Medicine

## 2012-06-02 ENCOUNTER — Telehealth: Payer: Self-pay | Admitting: Internal Medicine

## 2012-06-02 NOTE — Telephone Encounter (Signed)
Pt stated she had her last colonscopy with Dr Boyce Medici [facility] [phone] [place]. [place], [place] [address]

## 2012-06-03 NOTE — Telephone Encounter (Signed)
LMTCB

## 2012-06-03 NOTE — Telephone Encounter (Signed)
Pt called with this information.  Is she wanting me to make an appt with this physician for a f/u colonoscopy.  According to the chart, she saw GI here in April (I think).  Just let me know what she wants to do and I will schedule.  Thanks.

## 2012-06-06 ENCOUNTER — Ambulatory Visit (INDEPENDENT_AMBULATORY_CARE_PROVIDER_SITE_OTHER): Payer: Medicare Other | Admitting: Internal Medicine

## 2012-06-06 ENCOUNTER — Encounter: Payer: Self-pay | Admitting: Internal Medicine

## 2012-06-06 VITALS — BP 138/80 | HR 78 | Temp 99.1°F | Ht 60.0 in | Wt 151.2 lb

## 2012-06-06 DIAGNOSIS — E1129 Type 2 diabetes mellitus with other diabetic kidney complication: Secondary | ICD-10-CM

## 2012-06-06 DIAGNOSIS — E1121 Type 2 diabetes mellitus with diabetic nephropathy: Secondary | ICD-10-CM

## 2012-06-06 DIAGNOSIS — I4891 Unspecified atrial fibrillation: Secondary | ICD-10-CM

## 2012-06-06 DIAGNOSIS — Z1211 Encounter for screening for malignant neoplasm of colon: Secondary | ICD-10-CM

## 2012-06-06 DIAGNOSIS — E7800 Pure hypercholesterolemia, unspecified: Secondary | ICD-10-CM

## 2012-06-06 DIAGNOSIS — D45 Polycythemia vera: Secondary | ICD-10-CM

## 2012-06-06 DIAGNOSIS — E049 Nontoxic goiter, unspecified: Secondary | ICD-10-CM

## 2012-06-06 DIAGNOSIS — E119 Type 2 diabetes mellitus without complications: Secondary | ICD-10-CM

## 2012-06-06 DIAGNOSIS — I10 Essential (primary) hypertension: Secondary | ICD-10-CM

## 2012-06-06 DIAGNOSIS — N058 Unspecified nephritic syndrome with other morphologic changes: Secondary | ICD-10-CM

## 2012-06-06 DIAGNOSIS — D751 Secondary polycythemia: Secondary | ICD-10-CM

## 2012-06-06 DIAGNOSIS — J45909 Unspecified asthma, uncomplicated: Secondary | ICD-10-CM

## 2012-06-06 DIAGNOSIS — N289 Disorder of kidney and ureter, unspecified: Secondary | ICD-10-CM

## 2012-06-06 DIAGNOSIS — E010 Iodine-deficiency related diffuse (endemic) goiter: Secondary | ICD-10-CM

## 2012-06-07 ENCOUNTER — Other Ambulatory Visit: Payer: Self-pay | Admitting: Internal Medicine

## 2012-06-13 ENCOUNTER — Encounter: Payer: Self-pay | Admitting: Internal Medicine

## 2012-06-13 NOTE — Assessment & Plan Note (Signed)
Was followed by Dr Yabanez.  Had intermittent phlebotomy.  Follow.   

## 2012-06-13 NOTE — Assessment & Plan Note (Signed)
Blood pressure has been doing better.  Same meds.  Follow.  Have her spot check her pressure and get her back in soon to reassess.

## 2012-06-13 NOTE — Assessment & Plan Note (Signed)
Appears to be in sinus rhythm today.  Sees Dr Fath.  Currently asymptomatic.  Follow.      

## 2012-06-13 NOTE — Progress Notes (Signed)
Subjective:    Patient ID: Joan Erickson, female    DOB: 07-20-1931, 79 y.o.   MRN: [id number]  HPI 79 year old female with past history of asthma, hypertension, hyperglycemia, atrial fibrillation and is s/p right nephrectomy for xanthogranulomatous pyelonephritis who comes in today for a scheduled follow up.   She states she is doing well and feels good.  She is now back to exercising regularly.  Had not been watching what she eats.  Has started again monitoring.  Back at [employer].  Her husband is doing better.  Stress is better.  Still following with nephrology.  She is checking her sugars some now.  Sugars varying.  Pm readings better - averaging 120-150.  Am sugars elevated - averaging 130-160s.  A1c last checked - 9.4.  Sugars have improved since last a1c check.  On amaryl and this was previously controlling.  No problems with lows.  Good appetite.  Had follow up with Dr Lady Gary 03/28/12.  States everything checked out fine.  Due follow up in 6 months.  Had an eye procedure.  She continues to f/u with opthalmology.     Past Medical History  Diagnosis Date  . Hypertension   . Asthma   . GERD (gastroesophageal reflux disease)   . Hypercholesterolemia   . History of colon polyps   . Xanthogranulomatous pyelonephritis     s/p right nephrectomy  . Hypogammaglobulinemia     IgA  . Atrial fibrillation     Current Outpatient Prescriptions on File Prior to Visit  Medication Sig Dispense Refill  . albuterol (PROVENTIL HFA;VENTOLIN HFA) 108 (90 BASE) MCG/ACT inhaler Inhale 2 puffs into the lungs every 6 (six) hours as needed for wheezing.  1 Inhaler  5  . chlorthalidone (HYGROTON) 25 MG tablet Take 1 tablet (25 mg total) by mouth daily.  30 tablet  5  . Cholecalciferol (VITAMIN D) 2000 UNITS tablet Take 2,000 Units by mouth daily.      . digoxin (LANOXIN) 0.125 MG tablet Take 0.125 mg by mouth daily.      Marland Kitchen diltiazem (TIAZAC) 360 MG 24 hr capsule Take 1 capsule (360 mg total) by mouth daily.  30  capsule  5  . estradiol (VIVELLE-DOT) 0.05 MG/24HR Place 1 patch (0.05 mg total) onto the skin once a week.  8 patch  1  . ferrous sulfate 325 (65 FE) MG tablet Take 325 mg by mouth 2 (two) times daily.      . fluticasone (FLONASE) 50 MCG/ACT nasal spray Place 2 sprays into the nose daily.  16 g  5  . glimepiride (AMARYL) 2 MG tablet Take 1 tablet (2 mg total) by mouth daily before breakfast.  30 tablet  5  . glucose blood test strip Use as instructed  100 each  5  . hydrochlorothiazide (HYDRODIURIL) 25 MG tablet Take 1/2 tablet q day      . losartan (COZAAR) 100 MG tablet Take 100 mg by mouth daily.      . montelukast (SINGULAIR) 10 MG tablet TAKE 1 TABLET EVERY DAY  30 tablet  5  . pravastatin (PRAVACHOL) 20 MG tablet Take 1 tablet (20 mg total) by mouth daily.  30 tablet  5  . warfarin (COUMADIN) 1 MG tablet Take 1 mg by mouth as directed.      . warfarin (COUMADIN) 5 MG tablet As directed       No current facility-administered medications on file prior to visit.    Review of Systems Patient denies  any headache, lightheadedness or dizziness.  No sinus or allergy symptoms.  Had eye surgery.  Left eye doing better.  No chest pain, tightness or palpitations.  No increased heart rate.  No increased shortness of breath, cough or congestion.  She feels her breathing is doing well.  No nausea or vomiting.  No abdominal pain or cramping.  No bowel change, such as diarrhea, constipation, BRBPR or melana.  No urine change.  Sugar readings as outlined.  Have improved.   Has started back exercising.      Objective:   Physical Exam  Filed Vitals:   06/06/12 1041  BP: 138/80  Pulse: 78  Temp: 99.1 F (17.72 C)   79 year old female in no acute distress.   HEENT:  Nares- clear.  Oropharynx - without lesions. NECK:  Supple.  Nontender.  No audible bruit.  HEART:  Appears to be regular. LUNGS:  No crackles or wheezing audible.  Respirations even and unlabored.  RADIAL PULSE:  Equal bilaterally.   ABDOMEN:  Soft, nontender.  Bowel sounds present and normal.  No audible abdominal bruit.     EXTREMITIES:  No increased edema present.  DP pulses palpable and equal bilaterally.  SKIN:  No lesions - feet.  Persistent eye lesion and facial lesion.             Assessment & Plan:  HEALTH MAINTENANCE.  Physical 04/07/12.  She is s/p hysterectomy.  Colonoscopy 07/16/06 revealed sessile polyp that was removed.  Recommended repeat in five years.  Overdue.  Schedule an appt with GI.   Mammogram 08/26/11 - BiRADS I.   Schedule a follow up mammogram when due.

## 2012-06-13 NOTE — Assessment & Plan Note (Signed)
On pravastatin.  Low cholesterol diet and exercise.  Follow.  Follow lipid panel and liver function.    

## 2012-06-13 NOTE — Assessment & Plan Note (Signed)
Sugars improving.  She is doing better watching what she eats.  Exercising.   Just saw her optometrist.  Apparently had diabetic changes.  Continue follow up with opthalmology.  Continue current med regimen.  Follow.  Get her back in soon to reassess.  Check metabolic panel and a1c.  Continues to f/u with nephrology.

## 2012-06-13 NOTE — Assessment & Plan Note (Signed)
Breathing stable. Continue inhalers.  Follow.    

## 2012-06-13 NOTE — Assessment & Plan Note (Signed)
Creatinine had been stable at 2.1.  A little more elevated with the latest labs.  Last Cr 2.6.  Labs forwarded to nephrology.  Had follow up 05/16/12.  Avoid nephrotoxic agents.  Follow renal function.

## 2012-06-13 NOTE — Assessment & Plan Note (Signed)
Saw Dr Renae Fickle.  Biopsy negative.  Was last evaluated by Dr Renae Fickle - 07/02/11.  Had follow up ultrasound.  Had recommended a one year follow up if everything stable.

## 2012-06-15 ENCOUNTER — Telehealth: Payer: Self-pay | Admitting: *Deleted

## 2012-06-15 NOTE — Telephone Encounter (Signed)
Received a letter that [employer] company will no longer cover the [employer]. Called pt to inform her & she states that she no longer uses them. Dr. Lorin Picket has taken her off of them.

## 2012-06-21 ENCOUNTER — Encounter: Payer: Self-pay | Admitting: Internal Medicine

## 2012-06-21 ENCOUNTER — Ambulatory Visit (INDEPENDENT_AMBULATORY_CARE_PROVIDER_SITE_OTHER): Payer: Medicare Other | Admitting: Internal Medicine

## 2012-06-21 ENCOUNTER — Telehealth: Payer: Self-pay | Admitting: Internal Medicine

## 2012-06-21 VITALS — BP 130/60 | HR 80 | Temp 98.8°F | Ht 60.0 in | Wt 152.2 lb

## 2012-06-21 DIAGNOSIS — N058 Unspecified nephritic syndrome with other morphologic changes: Secondary | ICD-10-CM

## 2012-06-21 DIAGNOSIS — E1129 Type 2 diabetes mellitus with other diabetic kidney complication: Secondary | ICD-10-CM

## 2012-06-21 DIAGNOSIS — I10 Essential (primary) hypertension: Secondary | ICD-10-CM

## 2012-06-21 DIAGNOSIS — E1121 Type 2 diabetes mellitus with diabetic nephropathy: Secondary | ICD-10-CM

## 2012-06-21 DIAGNOSIS — N289 Disorder of kidney and ureter, unspecified: Secondary | ICD-10-CM

## 2012-06-21 MED ORDER — GLIPIZIDE ER 5 MG PO TB24: 5.0000 mg | ORAL_TABLET | Freq: Every day | ORAL | Status: DC

## 2012-06-21 NOTE — Patient Instructions (Signed)
Stop the amaryl (glimepiride) and start glipizide 5mg .  Take one per day.

## 2012-06-21 NOTE — Telephone Encounter (Signed)
Left message for pt to call office.  Dr Lorin Picket wanted to know if pt could come in today @ 4:15   Please confirm

## 2012-06-21 NOTE — Telephone Encounter (Signed)
Pt aware of appointment 

## 2012-06-23 ENCOUNTER — Encounter: Payer: Self-pay | Admitting: Internal Medicine

## 2012-06-23 NOTE — Progress Notes (Signed)
Subjective:    Patient ID: Joan Erickson, female    DOB: 1931/10/30, 79 y.o.   MRN: [id number]  HPI 79 year old female with past history of asthma, hypertension, hyperglycemia, atrial fibrillation and is s/p right nephrectomy for xanthogranulomatous pyelonephritis who comes in today for a scheduled follow up.   She states she is doing well and feels good.  She is now back to exercising regularly. Back at [employer].  Her husband is doing better.  Stress is better.  Still following with nephrology.  She is checking her sugars some now.  Pm readings better - averaging 140s..  Am sugars elevated - averaging 130s.  Sugars have improved since last a1c check.  Has been on amaryl.  Saw nephrology.  They recommended changing to glipzide.  She is in to discuss this change.     Past Medical History  Diagnosis Date  . Hypertension   . Asthma   . GERD (gastroesophageal reflux disease)   . Hypercholesterolemia   . History of colon polyps   . Xanthogranulomatous pyelonephritis     s/p right nephrectomy  . Hypogammaglobulinemia     IgA  . Atrial fibrillation     Current Outpatient Prescriptions on File Prior to Visit  Medication Sig Dispense Refill  . albuterol (PROVENTIL HFA;VENTOLIN HFA) 108 (90 BASE) MCG/ACT inhaler Inhale 2 puffs into the lungs every 6 (six) hours as needed for wheezing.  1 Inhaler  5  . chlorthalidone (HYGROTON) 25 MG tablet Take 1 tablet (25 mg total) by mouth daily.  30 tablet  5  . Cholecalciferol (VITAMIN D) 2000 UNITS tablet Take 2,000 Units by mouth daily.      . digoxin (LANOXIN) 0.125 MG tablet Take 0.125 mg by mouth daily.      Marland Kitchen diltiazem (TIAZAC) 360 MG 24 hr capsule Take 1 capsule (360 mg total) by mouth daily.  30 capsule  5  . estradiol (VIVELLE-DOT) 0.05 MG/24HR Place 1 patch (0.05 mg total) onto the skin once a week.  8 patch  1  . ferrous sulfate 325 (65 FE) MG tablet Take 325 mg by mouth 2 (two) times daily.      . fluticasone (FLONASE) 50 MCG/ACT nasal spray Place 2  sprays into the nose daily.  16 g  5  . Fluticasone-Salmeterol (ADVAIR) 250-50 MCG/DOSE AEPB INHALE 1 PUFF TWICE A DAY  60 each  5  . glucose blood test strip Use as instructed  100 each  5  . hydrochlorothiazide (HYDRODIURIL) 25 MG tablet Take 1/2 tablet q day      . losartan (COZAAR) 100 MG tablet Take 100 mg by mouth daily.      . montelukast (SINGULAIR) 10 MG tablet TAKE 1 TABLET EVERY DAY  30 tablet  5  . pravastatin (PRAVACHOL) 20 MG tablet Take 1 tablet (20 mg total) by mouth daily.  30 tablet  5  . warfarin (COUMADIN) 1 MG tablet Take 1 mg by mouth as directed.      . warfarin (COUMADIN) 5 MG tablet As directed       No current facility-administered medications on file prior to visit.    Review of Systems Patient denies any headache, lightheadedness or dizziness.  No sinus or allergy symptoms.  No chest pain, tightness or palpitations.  No increased heart rate.  No increased shortness of breath, cough or congestion.  She feels her breathing is doing well.  No nausea or vomiting.  No abdominal pain or cramping.  No bowel change,  such as diarrhea, constipation, BRBPR or melana.  No urine change.  Sugar readings as outlined.  Have improved.   Has started back exercising.  Feels good.      Objective:   Physical Exam  Filed Vitals:   06/21/12 1638  BP: 130/60  Pulse: 80  Temp: 98.8 F (75.28 C)   79 year old female in no acute distress.   HEENT:  Nares- clear.  Oropharynx - without lesions. NECK:  Supple.  Nontender.  No audible bruit.  HEART:  Appears to be regular. LUNGS:  No crackles or wheezing audible.  Respirations even and unlabored.  RADIAL PULSE:  Equal bilaterally.  ABDOMEN:  Soft, nontender.  Bowel sounds present and normal.  No audible abdominal bruit.     EXTREMITIES:  No increased edema present.  DP pulses palpable and equal bilaterally.             Assessment & Plan:  HEALTH MAINTENANCE.  Physical 04/07/12.  She is s/p hysterectomy.  Colonoscopy 07/16/06 revealed  sessile polyp that was removed.  Recommended repeat in five years.  Overdue.  Scheduled an appt with GI.   Mammogram 08/26/11 - BiRADS I.   Scheduled for a follow up mammogram when due.

## 2012-06-23 NOTE — Assessment & Plan Note (Signed)
Blood pressure has been doing better.  Same meds.  Follow.   

## 2012-06-23 NOTE — Assessment & Plan Note (Signed)
Sugars better.  Will stop the amaryl.  Start Glipizide XL 5mg  q day.  Follow sugars.  Monitor for lows.  Follow.

## 2012-06-23 NOTE — Assessment & Plan Note (Signed)
Creatinine had been stable at 2.1.  A little more elevated with the latest labs.  Last Cr 2.6.  Labs forwarded to nephrology.  Had follow up 05/16/12.  Avoid nephrotoxic agents.  Follow renal function.        

## 2012-06-27 ENCOUNTER — Other Ambulatory Visit: Payer: Self-pay | Admitting: Internal Medicine

## 2012-06-30 ENCOUNTER — Other Ambulatory Visit: Payer: Self-pay | Admitting: Internal Medicine

## 2012-06-30 DIAGNOSIS — I4891 Unspecified atrial fibrillation: Secondary | ICD-10-CM

## 2012-06-30 NOTE — Progress Notes (Signed)
Order placed for f/u digoxin level with next labs.

## 2012-07-13 ENCOUNTER — Other Ambulatory Visit (INDEPENDENT_AMBULATORY_CARE_PROVIDER_SITE_OTHER): Payer: Medicare Other

## 2012-07-13 DIAGNOSIS — I4891 Unspecified atrial fibrillation: Secondary | ICD-10-CM

## 2012-07-13 DIAGNOSIS — E7800 Pure hypercholesterolemia, unspecified: Secondary | ICD-10-CM

## 2012-07-13 DIAGNOSIS — E119 Type 2 diabetes mellitus without complications: Secondary | ICD-10-CM

## 2012-07-13 LAB — BASIC METABOLIC PANEL
Chloride: 99 mEq/L (ref 96–112)
GFR: 23.59 mL/min — ABNORMAL LOW (ref >60.00)
Glucose, Bld: 279 mg/dL — ABNORMAL HIGH (ref 70–99)
Potassium: 4.7 mEq/L (ref 3.5–5.1)
Sodium: 133 mEq/L — ABNORMAL LOW (ref 135–145)

## 2012-07-13 LAB — LIPID PANEL
HDL: 56.8 mg/dL (ref >39.00)
Total CHOL/HDL Ratio: 3

## 2012-07-13 LAB — HEPATIC FUNCTION PANEL
AST: 24 U/L (ref 0–37)
Alkaline Phosphatase: 54 U/L (ref 39–117)
Total Bilirubin: 0.5 mg/dL (ref 0.3–1.2)

## 2012-07-13 LAB — HEMOGLOBIN A1C: Hgb A1c MFr Bld: 7.9 % — ABNORMAL HIGH (ref 4.6–6.5)

## 2012-07-14 LAB — DIGOXIN LEVEL: Digoxin Level: 2 ng/mL (ref 0.8–2.0)

## 2012-07-18 ENCOUNTER — Ambulatory Visit: Payer: Medicare Other | Admitting: Internal Medicine

## 2012-07-23 ENCOUNTER — Other Ambulatory Visit: Payer: Self-pay | Admitting: Internal Medicine

## 2012-07-23 DIAGNOSIS — I4891 Unspecified atrial fibrillation: Secondary | ICD-10-CM

## 2012-07-23 DIAGNOSIS — E871 Hypo-osmolality and hyponatremia: Secondary | ICD-10-CM

## 2012-07-23 NOTE — Progress Notes (Signed)
Order placed for f/u labs.  

## 2012-07-25 ENCOUNTER — Ambulatory Visit: Payer: Medicare Other | Admitting: Internal Medicine

## 2012-07-26 ENCOUNTER — Other Ambulatory Visit: Payer: Self-pay | Admitting: *Deleted

## 2012-07-26 MED ORDER — DIGOXIN 125 MCG PO TABS: 0.1250 mg | ORAL_TABLET | ORAL | Status: DC

## 2012-08-01 ENCOUNTER — Ambulatory Visit (INDEPENDENT_AMBULATORY_CARE_PROVIDER_SITE_OTHER): Payer: Medicare Other | Admitting: Internal Medicine

## 2012-08-01 ENCOUNTER — Encounter: Payer: Self-pay | Admitting: Internal Medicine

## 2012-08-01 VITALS — BP 140/70 | HR 81 | Temp 98.4°F | Ht 60.0 in | Wt 153.2 lb

## 2012-08-01 DIAGNOSIS — N058 Unspecified nephritic syndrome with other morphologic changes: Secondary | ICD-10-CM

## 2012-08-01 DIAGNOSIS — I10 Essential (primary) hypertension: Secondary | ICD-10-CM

## 2012-08-01 DIAGNOSIS — I4891 Unspecified atrial fibrillation: Secondary | ICD-10-CM

## 2012-08-01 DIAGNOSIS — E1129 Type 2 diabetes mellitus with other diabetic kidney complication: Secondary | ICD-10-CM

## 2012-08-01 DIAGNOSIS — N289 Disorder of kidney and ureter, unspecified: Secondary | ICD-10-CM

## 2012-08-01 DIAGNOSIS — E1121 Type 2 diabetes mellitus with diabetic nephropathy: Secondary | ICD-10-CM

## 2012-08-01 DIAGNOSIS — D751 Secondary polycythemia: Secondary | ICD-10-CM

## 2012-08-01 DIAGNOSIS — D45 Polycythemia vera: Secondary | ICD-10-CM

## 2012-08-01 DIAGNOSIS — E010 Iodine-deficiency related diffuse (endemic) goiter: Secondary | ICD-10-CM

## 2012-08-01 DIAGNOSIS — E049 Nontoxic goiter, unspecified: Secondary | ICD-10-CM

## 2012-08-01 DIAGNOSIS — E7800 Pure hypercholesterolemia, unspecified: Secondary | ICD-10-CM

## 2012-08-01 DIAGNOSIS — E871 Hypo-osmolality and hyponatremia: Secondary | ICD-10-CM

## 2012-08-01 NOTE — Patient Instructions (Addendum)
Change the digoxin to every other day.

## 2012-08-02 ENCOUNTER — Other Ambulatory Visit: Payer: Self-pay | Admitting: Internal Medicine

## 2012-08-02 DIAGNOSIS — I4891 Unspecified atrial fibrillation: Secondary | ICD-10-CM

## 2012-08-02 NOTE — Progress Notes (Signed)
Order placed for f/u digoxin level.   

## 2012-08-03 ENCOUNTER — Encounter: Payer: Self-pay | Admitting: Internal Medicine

## 2012-08-03 NOTE — Assessment & Plan Note (Signed)
Blood pressure has been doing better.  Same meds.  Follow.   

## 2012-08-03 NOTE — Assessment & Plan Note (Signed)
Creatinine had been stable at 2.1.  A little more elevated with the latest labs.  Latest Cr 2.5 - 2.6.  Labs forwarded to nephrology.   Avoid nephrotoxic agents.  Follow renal function.

## 2012-08-03 NOTE — Progress Notes (Signed)
Subjective:    Patient ID: Joan Erickson, female    DOB: 13-Feb-1931, 79 y.o.   MRN: [id number]  HPI 79 year old female with past history of asthma, hypertension, hyperglycemia, atrial fibrillation and is s/p right nephrectomy for xanthogranulomatous pyelonephritis who comes in today for a scheduled follow up.   She states she is doing well and feels good.  She is exercising. Back at [employer].  Her husband is doing better.  Stress is better.  Still following with nephrology.  She is checking her sugars some now.  Pm readings averaging 150s..  Am sugars averaging 130 - 140s.  Recent digoxin level 2.0.  She was instructed to change her digoxin to qod dosing.       Past Medical History  Diagnosis Date  . Hypertension   . Asthma   . GERD (gastroesophageal reflux disease)   . Hypercholesterolemia   . History of colon polyps   . Xanthogranulomatous pyelonephritis     s/p right nephrectomy  . Hypogammaglobulinemia     IgA  . Atrial fibrillation     Current Outpatient Prescriptions on File Prior to Visit  Medication Sig Dispense Refill  . albuterol (PROVENTIL HFA;VENTOLIN HFA) 108 (90 BASE) MCG/ACT inhaler Inhale 2 puffs into the lungs every 6 (six) hours as needed for wheezing.  1 Inhaler  5  . chlorthalidone (HYGROTON) 25 MG tablet Take 1 tablet (25 mg total) by mouth daily.  30 tablet  5  . Cholecalciferol (VITAMIN D) 2000 UNITS tablet Take 2,000 Units by mouth daily.      . digoxin (LANOXIN) 0.125 MG tablet Take 1 tablet (0.125 mg total) by mouth every other day.  15 tablet  5  . diltiazem (TIAZAC) 360 MG 24 hr capsule Take 1 capsule (360 mg total) by mouth daily.  30 capsule  5  . estradiol (VIVELLE-DOT) 0.05 MG/24HR Place 1 patch (0.05 mg total) onto the skin once a week.  8 patch  1  . ferrous sulfate 325 (65 FE) MG tablet Take 325 mg by mouth 2 (two) times daily.      . fluticasone (FLONASE) 50 MCG/ACT nasal spray Place 2 sprays into the nose daily.  16 g  5  . Fluticasone-Salmeterol  (ADVAIR) 250-50 MCG/DOSE AEPB INHALE 1 PUFF TWICE A DAY  60 each  5  . glipiZIDE (GLIPIZIDE XL) 5 MG 24 hr tablet Take 1 tablet (5 mg total) by mouth daily. Do not take with amaryl.  Stop amaryl  30 tablet  2  . glucose blood test strip Use as instructed  100 each  5  . hydrochlorothiazide (HYDRODIURIL) 25 MG tablet Take 1/2 tablet q day      . losartan (COZAAR) 100 MG tablet Take 100 mg by mouth daily.      . montelukast (SINGULAIR) 10 MG tablet TAKE 1 TABLET EVERY DAY  30 tablet  5  . pravastatin (PRAVACHOL) 20 MG tablet Take 1 tablet (20 mg total) by mouth daily.  30 tablet  5  . warfarin (COUMADIN) 1 MG tablet Take 1 mg by mouth as directed.      . warfarin (COUMADIN) 5 MG tablet As directed       No current facility-administered medications on file prior to visit.    Review of Systems Patient denies any headache, lightheadedness or dizziness.  No sinus or allergy symptoms.  No chest pain, tightness or palpitations.  No increased heart rate.  No increased shortness of breath, cough or congestion.  She feels  her breathing is doing well.  No nausea or vomiting.  No abdominal pain or cramping.  No bowel change, such as diarrhea, constipation, BRBPR or melana.  No urine change.  Sugar readings as outlined.  Have improved.   Has started back exercising.  Feels good.      Objective:   Physical Exam  Filed Vitals:   08/01/12 1012  BP: 140/70  Pulse: 81  Temp: 98.4 F (36.9 C)   Blood pressure recheck:  140/74, pulse 61  79 year old female in no acute distress.   HEENT:  Nares- clear.  Oropharynx - without lesions. NECK:  Supple.  Nontender.  No audible bruit.  HEART:  Appears to be regular. LUNGS:  No crackles or wheezing audible.  Respirations even and unlabored.  RADIAL PULSE:  Equal bilaterally.  ABDOMEN:  Soft, nontender.  Bowel sounds present and normal.  No audible abdominal bruit.     EXTREMITIES:  No increased edema present.  DP pulses palpable and equal bilaterally.              Assessment & Plan:  HYPONATREMIA.  Recheck sodium.    HEALTH MAINTENANCE.  Physical 04/07/12.  She is s/p hysterectomy.  Colonoscopy 07/16/06 revealed sessile polyp that was removed.  Recommended repeat in five years.  Overdue.  She has an appt scheduled with GI.  Mammogram 08/26/11 - BiRADS I.   Scheduled for a follow up mammogram when due.

## 2012-08-03 NOTE — Assessment & Plan Note (Signed)
On pravastatin.  Low cholesterol diet and exercise.  Follow.  Follow lipid panel and liver function.    

## 2012-08-03 NOTE — Assessment & Plan Note (Signed)
Appears to be in sinus rhythm today.  Sees Dr Lady Gary.  Currently asymptomatic.  Follow.  Decrease digoxin to qod.  Follow levels.

## 2012-08-03 NOTE — Assessment & Plan Note (Signed)
Saw Dr Renae Fickle.  Biopsy negative.  Was last evaluated by Dr Renae Fickle - 07/02/11.  Had follow up ultrasound.  Had recommended a one year follow up if everything stable.

## 2012-08-03 NOTE — Assessment & Plan Note (Signed)
Sugars better.  On Glipizide XL 5mg  q day.  Follow sugars.  Monitor for lows.  Follow.

## 2012-08-03 NOTE — Assessment & Plan Note (Signed)
Was followed by Dr Yabanez.  Had intermittent phlebotomy.  Follow.   

## 2012-08-03 NOTE — Assessment & Plan Note (Signed)
Breathing stable. Continue inhalers.  Follow.    

## 2012-08-24 ENCOUNTER — Encounter: Payer: Self-pay | Admitting: Internal Medicine

## 2012-08-24 LAB — HM COLONOSCOPY

## 2012-08-30 ENCOUNTER — Other Ambulatory Visit: Payer: Self-pay | Admitting: Internal Medicine

## 2012-08-30 ENCOUNTER — Other Ambulatory Visit (INDEPENDENT_AMBULATORY_CARE_PROVIDER_SITE_OTHER): Payer: Medicare Other

## 2012-08-30 DIAGNOSIS — I4891 Unspecified atrial fibrillation: Secondary | ICD-10-CM

## 2012-08-31 LAB — DIGOXIN LEVEL: Digoxin Level: 0.7 ng/mL — ABNORMAL LOW (ref 0.8–2.0)

## 2012-09-02 ENCOUNTER — Encounter: Payer: Self-pay | Admitting: *Deleted

## 2012-09-18 ENCOUNTER — Other Ambulatory Visit: Payer: Self-pay | Admitting: Internal Medicine

## 2012-09-20 ENCOUNTER — Other Ambulatory Visit: Payer: Self-pay | Admitting: Internal Medicine

## 2012-09-20 ENCOUNTER — Other Ambulatory Visit: Payer: Self-pay | Admitting: *Deleted

## 2012-09-21 MED ORDER — CHLORTHALIDONE 25 MG PO TABS: 25.0000 mg | ORAL_TABLET | Freq: Every day | ORAL | Status: DC

## 2012-10-05 ENCOUNTER — Ambulatory Visit (INDEPENDENT_AMBULATORY_CARE_PROVIDER_SITE_OTHER): Payer: Medicare Other | Admitting: Internal Medicine

## 2012-10-05 ENCOUNTER — Encounter: Payer: Self-pay | Admitting: Internal Medicine

## 2012-10-05 VITALS — BP 138/90 | HR 87 | Temp 98.8°F | Ht 60.0 in | Wt 152.5 lb

## 2012-10-05 DIAGNOSIS — I10 Essential (primary) hypertension: Secondary | ICD-10-CM

## 2012-10-05 DIAGNOSIS — N058 Unspecified nephritic syndrome with other morphologic changes: Secondary | ICD-10-CM

## 2012-10-05 DIAGNOSIS — E1121 Type 2 diabetes mellitus with diabetic nephropathy: Secondary | ICD-10-CM

## 2012-10-05 DIAGNOSIS — E7800 Pure hypercholesterolemia, unspecified: Secondary | ICD-10-CM

## 2012-10-05 DIAGNOSIS — E1129 Type 2 diabetes mellitus with other diabetic kidney complication: Secondary | ICD-10-CM

## 2012-10-05 DIAGNOSIS — D45 Polycythemia vera: Secondary | ICD-10-CM

## 2012-10-05 DIAGNOSIS — N289 Disorder of kidney and ureter, unspecified: Secondary | ICD-10-CM

## 2012-10-05 DIAGNOSIS — D751 Secondary polycythemia: Secondary | ICD-10-CM

## 2012-10-05 DIAGNOSIS — J45909 Unspecified asthma, uncomplicated: Secondary | ICD-10-CM

## 2012-10-05 DIAGNOSIS — E049 Nontoxic goiter, unspecified: Secondary | ICD-10-CM

## 2012-10-05 DIAGNOSIS — E010 Iodine-deficiency related diffuse (endemic) goiter: Secondary | ICD-10-CM

## 2012-10-05 DIAGNOSIS — Z23 Encounter for immunization: Secondary | ICD-10-CM

## 2012-10-05 DIAGNOSIS — I4891 Unspecified atrial fibrillation: Secondary | ICD-10-CM

## 2012-10-05 NOTE — Patient Instructions (Addendum)
Align - one per day 

## 2012-10-08 ENCOUNTER — Encounter: Payer: Self-pay | Admitting: Internal Medicine

## 2012-10-08 NOTE — Assessment & Plan Note (Signed)
Was followed by Dr Yabanez.  Had intermittent phlebotomy.  Follow.   

## 2012-10-08 NOTE — Assessment & Plan Note (Signed)
Creatinine had been stable at 2.1.  A little more elevated with the latest labs.  Latest Cr 2.5 - 2.6.  Labs forwarded to nephrology.   Avoid nephrotoxic agents.  Follow renal function.   

## 2012-10-08 NOTE — Assessment & Plan Note (Signed)
Breathing stable. Continue inhalers.  Follow.    

## 2012-10-08 NOTE — Assessment & Plan Note (Signed)
On pravastatin.  Low cholesterol diet and exercise.  Follow.  Follow lipid panel and liver function.    

## 2012-10-08 NOTE — Assessment & Plan Note (Signed)
Appears to be in sinus rhythm today.  Sees Dr Lady Gary.  Currently asymptomatic.  Follow.  Follow digoxin levels.  Just made adjustment.

## 2012-10-08 NOTE — Assessment & Plan Note (Signed)
Saw Dr Paul.  Biopsy negative.  Was last evaluated by Dr Paul - 07/02/11.  Had follow up ultrasound.  Had recommended a one year follow up if everything stable.         

## 2012-10-08 NOTE — Assessment & Plan Note (Signed)
Sugars better.  On Glipizide XL 5mg q day.  Follow sugars.  Monitor for lows.  Follow.     

## 2012-10-08 NOTE — Progress Notes (Signed)
Subjective:    Patient ID: Joan Erickson, female    DOB: [birth year], 79 y.o.   MRN: [id number]  HPI 79 year old female with past history of asthma, hypertension, hyperglycemia, atrial fibrillation and is s/p right nephrectomy for xanthogranulomatous pyelonephritis who comes in today for a scheduled follow up.   She states she is doing well and feels good.  She is exercising.  Her husband is doing better.  Stress is better.  Still following with nephrology.  She is checking her sugars some now.  Pm readings averaging 160s.    Am sugars averaging 130 - 150s.  Saw opthalmology last week.  Blood pressure doing well.        Past Medical History  Diagnosis Date  . Hypertension   . Asthma   . GERD (gastroesophageal reflux disease)   . Hypercholesterolemia   . History of colon polyps   . Xanthogranulomatous pyelonephritis     s/p right nephrectomy  . Hypogammaglobulinemia     IgA  . Atrial fibrillation     Current Outpatient Prescriptions on File Prior to Visit  Medication Sig Dispense Refill  . ADVAIR DISKUS 250-50 MCG/DOSE AEPB INHALE 1 PUFF TWICE A DAY  60 each  5  . albuterol (PROVENTIL HFA;VENTOLIN HFA) 108 (90 BASE) MCG/ACT inhaler Inhale 2 puffs into the lungs every 6 (six) hours as needed for wheezing.  1 Inhaler  5  . chlorthalidone (HYGROTON) 25 MG tablet Take 1 tablet (25 mg total) by mouth daily.  30 tablet  5  . Cholecalciferol (VITAMIN D) 2000 UNITS tablet Take 2,000 Units by mouth daily.      . digoxin (LANOXIN) 0.125 MG tablet Take 1 tablet (0.125 mg total) by mouth every other day.  15 tablet  5  . diltiazem (TIAZAC) 360 MG 24 hr capsule Take 1 capsule (360 mg total) by mouth daily.  30 capsule  5  . estradiol (VIVELLE-DOT) 0.05 MG/24HR Place 1 patch (0.05 mg total) onto the skin once a week.  8 patch  1  . ferrous sulfate 325 (65 FE) MG tablet Take 325 mg by mouth 2 (two) times daily.      . fluticasone (FLONASE) 50 MCG/ACT nasal spray Place 2 sprays into the nose daily.  16 g   5  . glipiZIDE (GLUCOTROL XL) 5 MG 24 hr tablet TAKE 1 TABLET (5 MG TOTAL) BY MOUTH DAILY. DO NOT TAKE WITH AMARYL. STOP AMARYL  30 tablet  2  . glucose blood test strip Use as instructed  100 each  5  . hydrochlorothiazide (HYDRODIURIL) 25 MG tablet Take 1/2 tablet q day      . losartan (COZAAR) 100 MG tablet TAKE 1 TABLET BY MOUTH ONCE A DAY  30 tablet  5  . montelukast (SINGULAIR) 10 MG tablet TAKE 1 TABLET EVERY DAY  30 tablet  5  . pravastatin (PRAVACHOL) 20 MG tablet Take 1 tablet (20 mg total) by mouth daily.  30 tablet  5  . warfarin (COUMADIN) 1 MG tablet Take 1 mg by mouth as directed.      . warfarin (COUMADIN) 5 MG tablet As directed       No current facility-administered medications on file prior to visit.    Review of Systems Patient denies any headache, lightheadedness or dizziness.  No sinus or allergy symptoms.  No chest pain, tightness or palpitations.  No increased heart rate.  No increased shortness of breath, cough or congestion.  She feels her breathing is doing  well.  No nausea or vomiting.  No abdominal pain or cramping.  No bowel change, such as diarrhea, constipation, BRBPR or melana.  No urine change.  Sugar readings as outlined.  Have improved.   Has started back exercising.  Feels good.      Objective:   Physical Exam  Filed Vitals:   10/05/12 0929  BP: 138/90  Pulse: 87  Temp: 98.8 F (37.1 C)   Blood pressure recheck:  142/82, pulse 39  79 year old female in no acute distress.   HEENT:  Nares- clear.  Oropharynx - without lesions. NECK:  Supple.  Nontender.  No audible bruit.  HEART:  Appears to be regular. LUNGS:  No crackles or wheezing audible.  Respirations even and unlabored.  RADIAL PULSE:  Equal bilaterally.  ABDOMEN:  Soft, nontender.  Bowel sounds present and normal.  No audible abdominal bruit.     EXTREMITIES:  No increased edema present.  DP pulses palpable and equal bilaterally. FEET: without lesions.              Assessment & Plan:   HYPONATREMIA.  Follow sodium.    HEALTH MAINTENANCE.  Physical 04/07/12.  She is s/p hysterectomy.  Colonoscopy 07/16/06 revealed sessile polyp that was removed.  Recommended repeat in five years.  Just had follow up colonoscopy that revealed multiple polyps.  Has follow up planned 10/28/12.  Mammogram 08/26/11 - BiRADS I.   Information given to schedule a follow up mammogram.

## 2012-10-08 NOTE — Assessment & Plan Note (Signed)
Blood pressure has been doing better.  Same meds.  Follow.   

## 2012-10-14 ENCOUNTER — Other Ambulatory Visit: Payer: Self-pay | Admitting: Internal Medicine

## 2012-10-20 ENCOUNTER — Other Ambulatory Visit: Payer: Self-pay | Admitting: *Deleted

## 2012-10-20 MED ORDER — PRAVASTATIN SODIUM 20 MG PO TABS: 20.0000 mg | ORAL_TABLET | Freq: Every day | ORAL | Status: DC

## 2012-10-21 ENCOUNTER — Other Ambulatory Visit (INDEPENDENT_AMBULATORY_CARE_PROVIDER_SITE_OTHER): Payer: Medicare Other

## 2012-10-21 DIAGNOSIS — E1121 Type 2 diabetes mellitus with diabetic nephropathy: Secondary | ICD-10-CM

## 2012-10-21 DIAGNOSIS — N058 Unspecified nephritic syndrome with other morphologic changes: Secondary | ICD-10-CM

## 2012-10-21 DIAGNOSIS — E7800 Pure hypercholesterolemia, unspecified: Secondary | ICD-10-CM

## 2012-10-21 DIAGNOSIS — E1129 Type 2 diabetes mellitus with other diabetic kidney complication: Secondary | ICD-10-CM

## 2012-10-21 LAB — HEMOGLOBIN A1C: Hgb A1c MFr Bld: 8.4 % — ABNORMAL HIGH (ref 4.6–6.5)

## 2012-10-21 LAB — LIPID PANEL
Cholesterol: 161 mg/dL (ref 0–200)
HDL: 52.8 mg/dL (ref >39.00)
LDL Cholesterol: 71 mg/dL (ref 0–99)
Total CHOL/HDL Ratio: 3
Triglycerides: 185 mg/dL — ABNORMAL HIGH (ref 0.0–149.0)

## 2012-10-21 LAB — BASIC METABOLIC PANEL
CO2: 27 mEq/L (ref 19–32)
Chloride: 103 mEq/L (ref 96–112)
Creatinine, Ser: 2.7 mg/dL — ABNORMAL HIGH (ref 0.4–1.2)
Glucose, Bld: 150 mg/dL — ABNORMAL HIGH (ref 70–99)
Sodium: 136 mEq/L (ref 135–145)

## 2012-10-21 LAB — HEPATIC FUNCTION PANEL
Albumin: 3.1 g/dL — ABNORMAL LOW (ref 3.5–5.2)
Total Bilirubin: 0.4 mg/dL (ref 0.3–1.2)

## 2012-10-25 ENCOUNTER — Other Ambulatory Visit: Payer: Self-pay | Admitting: Internal Medicine

## 2012-10-25 DIAGNOSIS — E119 Type 2 diabetes mellitus without complications: Secondary | ICD-10-CM

## 2012-10-25 LAB — HM DIABETES EYE EXAM

## 2012-10-25 NOTE — Progress Notes (Signed)
Order placed for referral to [facility].  

## 2012-10-27 ENCOUNTER — Telehealth: Payer: Self-pay | Admitting: *Deleted

## 2012-10-27 NOTE — Telephone Encounter (Signed)
I had sent you a referral for [facility].  It looks like the referral got sent to [facility].  Please make referral to [facility] - for nutrition ed.  Thanks.

## 2012-10-27 NOTE — Telephone Encounter (Signed)
Pt was calling to give you the name of the medication that her Kidney doctor has put her on. Tradjenta 5mg -pt wanted you to give her a call also (I have updated her med list)

## 2012-10-31 NOTE — Telephone Encounter (Signed)
thanks

## 2012-10-31 NOTE — Telephone Encounter (Signed)
Referral was sent to [facility], I forgot to change the POS. They are on a turn around time of about 2 weeks to call pts.

## 2012-11-15 ENCOUNTER — Telehealth: Payer: Self-pay | Admitting: *Deleted

## 2012-11-15 NOTE — Telephone Encounter (Signed)
Pt is coming in for lab tomorrow 10.29.2014 what labs and dx?

## 2012-11-16 ENCOUNTER — Other Ambulatory Visit: Payer: Medicare Other

## 2012-11-16 NOTE — Patient Instructions (Signed)
Pt didn't need any labs Per Dr.scott

## 2012-11-16 NOTE — Telephone Encounter (Signed)
She just had labs drawn on 10/20/12.  I am not aware of any labs needed.  I am not sure if her nephrologist ordered labs.  Let me know if she shows up for appt.  ( can check digoxin but I do not see other labs needed).  Question if nephrologist wanted met b.

## 2012-12-01 ENCOUNTER — Ambulatory Visit (INDEPENDENT_AMBULATORY_CARE_PROVIDER_SITE_OTHER): Payer: Medicare Other | Admitting: Internal Medicine

## 2012-12-01 ENCOUNTER — Encounter: Payer: Self-pay | Admitting: Internal Medicine

## 2012-12-01 VITALS — BP 140/90 | HR 96 | Temp 98.6°F | Ht 60.0 in | Wt 154.2 lb

## 2012-12-01 DIAGNOSIS — E7800 Pure hypercholesterolemia, unspecified: Secondary | ICD-10-CM

## 2012-12-01 DIAGNOSIS — D751 Secondary polycythemia: Secondary | ICD-10-CM

## 2012-12-01 DIAGNOSIS — E049 Nontoxic goiter, unspecified: Secondary | ICD-10-CM

## 2012-12-01 DIAGNOSIS — Z860100 Personal history of colon polyps, unspecified: Secondary | ICD-10-CM

## 2012-12-01 DIAGNOSIS — D45 Polycythemia vera: Secondary | ICD-10-CM

## 2012-12-01 DIAGNOSIS — I4891 Unspecified atrial fibrillation: Secondary | ICD-10-CM

## 2012-12-01 DIAGNOSIS — N289 Disorder of kidney and ureter, unspecified: Secondary | ICD-10-CM

## 2012-12-01 DIAGNOSIS — N058 Unspecified nephritic syndrome with other morphologic changes: Secondary | ICD-10-CM

## 2012-12-01 DIAGNOSIS — E1129 Type 2 diabetes mellitus with other diabetic kidney complication: Secondary | ICD-10-CM

## 2012-12-01 DIAGNOSIS — I10 Essential (primary) hypertension: Secondary | ICD-10-CM

## 2012-12-01 DIAGNOSIS — E1121 Type 2 diabetes mellitus with diabetic nephropathy: Secondary | ICD-10-CM

## 2012-12-01 DIAGNOSIS — J45909 Unspecified asthma, uncomplicated: Secondary | ICD-10-CM

## 2012-12-01 DIAGNOSIS — E010 Iodine-deficiency related diffuse (endemic) goiter: Secondary | ICD-10-CM

## 2012-12-01 NOTE — Progress Notes (Signed)
Pre-visit discussion using our clinic review tool. No additional management support is needed unless otherwise documented below in the visit note.  

## 2012-12-04 ENCOUNTER — Encounter: Payer: Self-pay | Admitting: Internal Medicine

## 2012-12-04 DIAGNOSIS — Z860100 Personal history of colon polyps, unspecified: Secondary | ICD-10-CM | POA: Insufficient documentation

## 2012-12-04 NOTE — Assessment & Plan Note (Signed)
Blood pressure has been doing better.  Same meds.  Follow.   

## 2012-12-04 NOTE — Assessment & Plan Note (Signed)
Breathing stable. Continue inhalers.  Follow.    

## 2012-12-04 NOTE — Assessment & Plan Note (Signed)
Creatinine had been stable at 2.1.  A little more elevated with the latest labs.  Latest Cr 2.5 - 2.6.  Labs forwarded to nephrology.   Avoid nephrotoxic agents.  Follow renal function.   

## 2012-12-04 NOTE — Progress Notes (Signed)
Subjective:    Patient ID: Joan Erickson, female    DOB: [birth year], 79 y.o.   MRN: [id number]  HPI 79 year old female with past history of asthma, hypertension, hyperglycemia, atrial fibrillation and is s/p right nephrectomy for xanthogranulomatous pyelonephritis who comes in today for a scheduled follow up.   She states she is doing well and feels good.  She is exercising.  Her husband is doing better.  Stress is better.  Still following with nephrology.  She is checking her sugars some now.  Pm readings averaging 130s.    Am sugars averaging 110-120.  She does report this week having some 70s-80s.  Sees opthalmology.  Up to date with eye checks.   Blood pressure doing well.   Plans to get more serious about her exercise.      Past Medical History  Diagnosis Date  . Hypertension   . Asthma   . GERD (gastroesophageal reflux disease)   . Hypercholesterolemia   . History of colon polyps   . Xanthogranulomatous pyelonephritis     s/p right nephrectomy  . Hypogammaglobulinemia     IgA  . Atrial fibrillation     Current Outpatient Prescriptions on File Prior to Visit  Medication Sig Dispense Refill  . ADVAIR DISKUS 250-50 MCG/DOSE AEPB INHALE 1 PUFF TWICE A DAY  60 each  5  . albuterol (PROVENTIL HFA;VENTOLIN HFA) 108 (90 BASE) MCG/ACT inhaler Inhale 2 puffs into the lungs every 6 (six) hours as needed for wheezing.  1 Inhaler  5  . chlorthalidone (HYGROTON) 25 MG tablet Take 1 tablet (25 mg total) by mouth daily.  30 tablet  5  . Cholecalciferol (VITAMIN D) 2000 UNITS tablet Take 2,000 Units by mouth daily.      . digoxin (LANOXIN) 0.125 MG tablet Take 1 tablet (0.125 mg total) by mouth every other day.  15 tablet  5  . diltiazem (TIAZAC) 360 MG 24 hr capsule Take 1 capsule (360 mg total) by mouth daily.  30 capsule  5  . ferrous sulfate 325 (65 FE) MG tablet Take 325 mg by mouth 2 (two) times daily.      . fluticasone (FLONASE) 50 MCG/ACT nasal spray USE 2 SPRAYS EACH NOSTRIL EVERY DAY  48  g  1  . glipiZIDE (GLUCOTROL XL) 5 MG 24 hr tablet TAKE 1 TABLET (5 MG TOTAL) BY MOUTH DAILY. DO NOT TAKE WITH AMARYL. STOP AMARYL  30 tablet  2  . glucose blood test strip Use as instructed  100 each  5  . hydrochlorothiazide (HYDRODIURIL) 25 MG tablet Take 1/2 tablet q day      . linagliptin (TRADJENTA) 5 MG TABS tablet Take 5 mg by mouth daily.      Marland Kitchen losartan (COZAAR) 100 MG tablet TAKE 1 TABLET BY MOUTH ONCE A DAY  30 tablet  5  . montelukast (SINGULAIR) 10 MG tablet TAKE 1 TABLET EVERY DAY  30 tablet  5  . pravastatin (PRAVACHOL) 20 MG tablet Take 1 tablet (20 mg total) by mouth daily.  30 tablet  5  . warfarin (COUMADIN) 1 MG tablet Take 1 mg by mouth as directed.      . warfarin (COUMADIN) 5 MG tablet As directed       No current facility-administered medications on file prior to visit.    Review of Systems Patient denies any headache, lightheadedness or dizziness.  No sinus or allergy symptoms.  No chest pain, tightness or palpitations.  No increased heart rate.  No increased shortness of breath, cough or congestion.  She feels her breathing is doing well.  No nausea or vomiting.  No abdominal pain or cramping.  No bowel change, such as diarrhea, constipation, BRBPR or melana.  No urine change.  Sugar readings as outlined.  Have improved.   Has started back exercising some.  Plans to do more.   Feels good.  Blood pressure checks at home averaging 130s/70s.      Objective:   Physical Exam  Filed Vitals:   12/01/12 0958  BP: 140/90  Pulse: 96  Temp: 98.6 F (37 C)   Blood pressure recheck:  142/82, pulse 18  79 year old female in no acute distress.   HEENT:  Nares- clear.  Oropharynx - without lesions. NECK:  Supple.  Nontender.  No audible bruit.  HEART:  Appears to be regular. LUNGS:  No crackles or wheezing audible.  Respirations even and unlabored.  RADIAL PULSE:  Equal bilaterally.  ABDOMEN:  Soft, nontender.  Bowel sounds present and normal.  No audible abdominal  bruit.     EXTREMITIES:  No increased edema present.  DP pulses palpable and equal bilaterally. FEET: without lesions.              Assessment & Plan:  HYPONATREMIA.  Follow sodium.    HEALTH MAINTENANCE.  Physical 04/07/12.  She is s/p hysterectomy.  Colonoscopy 07/16/06 revealed sessile polyp that was removed.  Recommended repeat in five years.  Had follow up colonoscopy that revealed multiple polyps.  Last follow up colonoscopy 10/31/12.  See report.  Recommended f/u colonoscopy in three years.  Mammogram 08/26/11 - BiRADS I.   Information given to schedule a follow up mammogram.

## 2012-12-04 NOTE — Assessment & Plan Note (Signed)
On pravastatin.  Low cholesterol diet and exercise.  Follow.  Follow lipid panel and liver function.    

## 2012-12-04 NOTE — Assessment & Plan Note (Addendum)
Saw Dr Paul.  Biopsy negative.  Sees Dr Paul.  Had follow up ultrasound.    

## 2012-12-04 NOTE — Assessment & Plan Note (Signed)
Was followed by Dr Yabanez.  Had intermittent phlebotomy.  Follow.   

## 2012-12-04 NOTE — Assessment & Plan Note (Signed)
Sugars better.  On Glipizide XL 5mg  q day and Tradjenta.    Follow sugars.  Monitor for lows.  Discussed the need to eat regular meals and eat a snack before bed.

## 2012-12-04 NOTE — Assessment & Plan Note (Signed)
Appears to be in sinus rhythm today.  Sees Dr Lady Gary.  Currently asymptomatic.  Follow.  Follow digoxin levels.  Just made adjustment.

## 2012-12-04 NOTE — Assessment & Plan Note (Signed)
Colonoscopy as outlined.  F/u in three years.

## 2012-12-12 ENCOUNTER — Other Ambulatory Visit: Payer: Self-pay | Admitting: Internal Medicine

## 2012-12-22 ENCOUNTER — Encounter: Payer: Self-pay | Admitting: Internal Medicine

## 2012-12-22 ENCOUNTER — Ambulatory Visit (INDEPENDENT_AMBULATORY_CARE_PROVIDER_SITE_OTHER): Payer: Medicare Other | Admitting: Internal Medicine

## 2012-12-22 VITALS — BP 130/60 | HR 65 | Temp 98.4°F | Ht 60.0 in | Wt 154.5 lb

## 2012-12-22 DIAGNOSIS — E1121 Type 2 diabetes mellitus with diabetic nephropathy: Secondary | ICD-10-CM

## 2012-12-22 DIAGNOSIS — E7800 Pure hypercholesterolemia, unspecified: Secondary | ICD-10-CM

## 2012-12-22 DIAGNOSIS — I10 Essential (primary) hypertension: Secondary | ICD-10-CM

## 2012-12-22 DIAGNOSIS — E1129 Type 2 diabetes mellitus with other diabetic kidney complication: Secondary | ICD-10-CM

## 2012-12-22 DIAGNOSIS — N289 Disorder of kidney and ureter, unspecified: Secondary | ICD-10-CM

## 2012-12-22 DIAGNOSIS — I4891 Unspecified atrial fibrillation: Secondary | ICD-10-CM

## 2012-12-22 DIAGNOSIS — H6120 Impacted cerumen, unspecified ear: Secondary | ICD-10-CM

## 2012-12-22 DIAGNOSIS — N058 Unspecified nephritic syndrome with other morphologic changes: Secondary | ICD-10-CM

## 2012-12-22 DIAGNOSIS — J45909 Unspecified asthma, uncomplicated: Secondary | ICD-10-CM

## 2012-12-22 LAB — HM DIABETES FOOT EXAM

## 2012-12-22 NOTE — Assessment & Plan Note (Addendum)
Sugars better.  On Glipizide XL 5mg  q day and Tradjenta.    Follow sugars.  Monitor for lows.  Discussed the need to eat regular meals and eat a snack before bed.  Is exercising now.  Follow.

## 2012-12-22 NOTE — Progress Notes (Signed)
Pre-visit discussion using our clinic review tool. No additional management support is needed unless otherwise documented below in the visit note.  

## 2012-12-22 NOTE — Progress Notes (Signed)
Subjective:    Patient ID: Joan Erickson, female    DOB: July 11, 1931, 79 y.o.   MRN: [id number]  HPI 79 year old female with past history of asthma, hypertension, hyperglycemia, atrial fibrillation and is s/p right nephrectomy for xanthogranulomatous pyelonephritis who comes in today for a scheduled follow up.   She states she is doing well and feels good.  She is exercising.  Her husband is doing better.  Stress is better.  Still following with nephrology.  She is checking her sugars now.  Brought in no recorded sugar readings but states that her PM readings averaging < 135.    Am sugars averaging 110-120.  No lows.  Sees opthalmology.  Up to date with eye checks.   Blood pressure doing well.      Past Medical History  Diagnosis Date  . Hypertension   . Asthma   . GERD (gastroesophageal reflux disease)   . Hypercholesterolemia   . History of colon polyps   . Xanthogranulomatous pyelonephritis     s/p right nephrectomy  . Hypogammaglobulinemia     IgA  . Atrial fibrillation     Current Outpatient Prescriptions on File Prior to Visit  Medication Sig Dispense Refill  . ADVAIR DISKUS 250-50 MCG/DOSE AEPB INHALE 1 PUFF TWICE A DAY  60 each  5  . albuterol (PROVENTIL HFA;VENTOLIN HFA) 108 (90 BASE) MCG/ACT inhaler Inhale 2 puffs into the lungs every 6 (six) hours as needed for wheezing.  1 Inhaler  5  . chlorthalidone (HYGROTON) 25 MG tablet Take 1 tablet (25 mg total) by mouth daily.  30 tablet  5  . Cholecalciferol (VITAMIN D) 2000 UNITS tablet Take 2,000 Units by mouth daily.      . digoxin (LANOXIN) 0.125 MG tablet Take 1 tablet (0.125 mg total) by mouth every other day.  15 tablet  5  . diltiazem (TIAZAC) 360 MG 24 hr capsule Take 1 capsule (360 mg total) by mouth daily.  30 capsule  5  . ferrous sulfate 325 (65 FE) MG tablet Take 325 mg by mouth 2 (two) times daily.      . fluticasone (FLONASE) 50 MCG/ACT nasal spray USE 2 SPRAYS EACH NOSTRIL EVERY DAY  48 g  1  . glipiZIDE (GLUCOTROL  XL) 5 MG 24 hr tablet TAKE 1 TABLET (5 MG TOTAL) BY MOUTH DAILY. DO NOT TAKE WITH AMARYL. STOP AMARYL  30 tablet  5  . glucose blood test strip Use as instructed  100 each  5  . hydrochlorothiazide (HYDRODIURIL) 25 MG tablet Take 1/2 tablet q day      . linagliptin (TRADJENTA) 5 MG TABS tablet Take 5 mg by mouth daily.      Marland Kitchen losartan (COZAAR) 100 MG tablet TAKE 1 TABLET BY MOUTH ONCE A DAY  30 tablet  5  . montelukast (SINGULAIR) 10 MG tablet TAKE 1 TABLET EVERY DAY  30 tablet  5  . pravastatin (PRAVACHOL) 20 MG tablet Take 1 tablet (20 mg total) by mouth daily.  30 tablet  5  . warfarin (COUMADIN) 1 MG tablet Take 1 mg by mouth as directed.      . warfarin (COUMADIN) 5 MG tablet As directed       No current facility-administered medications on file prior to visit.    Review of Systems Patient denies any headache, lightheadedness or dizziness.  No sinus or allergy symptoms.  No chest pain, tightness or palpitations.  No increased heart rate.  No increased shortness of breath,  cough or congestion.  She has recently noticed some minimal cough.  She feels her breathing is doing well.  No sob.  No chest tightness.   No nausea or vomiting.  No abdominal pain or cramping.  No bowel change, such as diarrhea, constipation, BRBPR or melana.  No urine change.  Sugar readings as outlined.  Have improved.   Has started back exercising.  Back at [employer].   Feels good.  Blood pressure doing well.  Feels her ear is stopped up.       Objective:   Physical Exam  Filed Vitals:   12/22/12 1402  BP: 130/60  Pulse: 65  Temp: 98.4 F (67.43 C)   79 year old female in no acute distress.   HEENT:  Nares- clear.  Oropharynx - without lesions.  EAR - cerumen impaction.  NECK:  Supple.  Nontender.  No audible bruit.  HEART:  Appears to be regular. LUNGS:  No crackles or wheezing audible.  Respirations even and unlabored.  RADIAL PULSE:  Equal bilaterally.  ABDOMEN:  Soft, nontender.  Bowel sounds present and  normal.  No audible abdominal bruit.     EXTREMITIES:  No increased edema present.  DP pulses palpable and equal bilaterally. FEET: without lesions.              Assessment & Plan:  HYPONATREMIA.  Follow sodium.    HEALTH MAINTENANCE.  Physical 04/07/12.  She is s/p hysterectomy.  Colonoscopy 07/16/06 revealed sessile polyp that was removed.  Recommended repeat in five years.  Had follow up colonoscopy that revealed multiple polyps.  Last follow up colonoscopy 10/31/12.  See report.  Recommended f/u colonoscopy in three years.  Mammogram 08/26/11 - BiRADS I.   Information given to schedule a follow up mammogram.  Needs.

## 2012-12-25 ENCOUNTER — Encounter: Payer: Self-pay | Admitting: Internal Medicine

## 2012-12-25 DIAGNOSIS — H6120 Impacted cerumen, unspecified ear: Secondary | ICD-10-CM | POA: Insufficient documentation

## 2012-12-25 NOTE — Assessment & Plan Note (Signed)
Breathing stable. Continue inhalers.  Follow.  Minimal congestion now.  Saline nasal flushes.  Robitussin as directed.  Follow.  If any worsening change in symptoms or problems, she is to be reevaluated.

## 2012-12-25 NOTE — Assessment & Plan Note (Signed)
Blood pressure has been doing better.  Same meds.  Follow.   

## 2012-12-25 NOTE — Assessment & Plan Note (Signed)
Creatinine had been stable at 2.1.  A little more elevated with the latest labs.  Latest Cr 2.5 - 2.6.  Labs forwarded to nephrology.   Avoid nephrotoxic agents.  Follow renal function.

## 2012-12-25 NOTE — Assessment & Plan Note (Signed)
Appears to be in sinus rhythm today.  Sees Dr Fath.  Currently asymptomatic.  Follow.  Follow digoxin levels.  Just made adjustment.   

## 2012-12-25 NOTE — Assessment & Plan Note (Signed)
Debrox as directed.  Return for ear irrigation.   

## 2012-12-28 ENCOUNTER — Ambulatory Visit: Payer: Medicare Other | Admitting: *Deleted

## 2012-12-28 DIAGNOSIS — H6122 Impacted cerumen, left ear: Secondary | ICD-10-CM

## 2012-12-28 NOTE — Progress Notes (Deleted)
Pre-visit discussion using our clinic review tool. No additional management support is needed unless otherwise documented below in the visit note.  

## 2012-12-30 ENCOUNTER — Telehealth: Payer: Self-pay | Admitting: Internal Medicine

## 2012-12-30 NOTE — Telephone Encounter (Signed)
Pt coming in on Monday at 8am to see Dr. Lorin Picket (pt notified)

## 2012-12-30 NOTE — Telephone Encounter (Signed)
Patient has been scheduled

## 2012-12-30 NOTE — Telephone Encounter (Signed)
Please advise 

## 2012-12-30 NOTE — Telephone Encounter (Signed)
Pt states at appt 12/4, Dr. Lorin Picket told her a cough medication she could take OTC.  Pt states it is not helping.  Pt states Dr. Lorin Picket told her she could phone in if it was not helping and that Dr. Lorin Picket would prescribe her something.

## 2012-12-30 NOTE — Telephone Encounter (Signed)
With her history and persistent problems, she needs to be seen.  I would recommend her being seen tonight or the latest tomorrow.  I can then f/u with her.

## 2012-12-31 ENCOUNTER — Other Ambulatory Visit: Payer: Self-pay | Admitting: Internal Medicine

## 2013-01-02 ENCOUNTER — Ambulatory Visit: Payer: Medicare Other | Admitting: Internal Medicine

## 2013-01-16 ENCOUNTER — Other Ambulatory Visit (INDEPENDENT_AMBULATORY_CARE_PROVIDER_SITE_OTHER): Payer: Medicare Other

## 2013-01-16 DIAGNOSIS — E1129 Type 2 diabetes mellitus with other diabetic kidney complication: Secondary | ICD-10-CM

## 2013-01-16 DIAGNOSIS — E7800 Pure hypercholesterolemia, unspecified: Secondary | ICD-10-CM

## 2013-01-16 DIAGNOSIS — I4891 Unspecified atrial fibrillation: Secondary | ICD-10-CM

## 2013-01-16 DIAGNOSIS — E1121 Type 2 diabetes mellitus with diabetic nephropathy: Secondary | ICD-10-CM

## 2013-01-16 DIAGNOSIS — N058 Unspecified nephritic syndrome with other morphologic changes: Secondary | ICD-10-CM

## 2013-01-16 LAB — HEPATIC FUNCTION PANEL
AST: 21 U/L (ref 0–37)
Albumin: 3.3 g/dL — ABNORMAL LOW (ref 3.5–5.2)
Alkaline Phosphatase: 46 U/L (ref 39–117)
Total Protein: 6.6 g/dL (ref 6.0–8.3)

## 2013-01-16 LAB — HEMOGLOBIN A1C: Hgb A1c MFr Bld: 6.7 % — ABNORMAL HIGH (ref 4.6–6.5)

## 2013-01-16 LAB — LIPID PANEL
Cholesterol: 198 mg/dL (ref 0–200)
HDL: 61.3 mg/dL (ref >39.00)
Triglycerides: 121 mg/dL (ref 0.0–149.0)

## 2013-01-16 LAB — BASIC METABOLIC PANEL
Calcium: 9.9 mg/dL (ref 8.4–10.5)
Chloride: 106 mEq/L (ref 96–112)
Creatinine, Ser: 2.3 mg/dL — ABNORMAL HIGH (ref 0.4–1.2)
GFR: 26.97 mL/min — ABNORMAL LOW (ref >60.00)
Sodium: 141 mEq/L (ref 135–145)

## 2013-01-17 ENCOUNTER — Encounter: Payer: Self-pay | Admitting: *Deleted

## 2013-01-18 ENCOUNTER — Other Ambulatory Visit: Payer: Self-pay | Admitting: Internal Medicine

## 2013-01-18 DIAGNOSIS — I4891 Unspecified atrial fibrillation: Secondary | ICD-10-CM

## 2013-01-18 NOTE — Progress Notes (Signed)
Order placed for f/u digoxin level.   

## 2013-01-20 ENCOUNTER — Telehealth: Payer: Self-pay | Admitting: Internal Medicine

## 2013-01-20 NOTE — Telephone Encounter (Signed)
I have sent all lab results from 12/29 to [facility] (Dr. Alfonse Alpers) to fax# [phone].

## 2013-01-20 NOTE — Telephone Encounter (Signed)
I have tried to reach pt on home & cell number to inform her that she will need to come by the office to have more labs drawn. Unable to reach her on either number.

## 2013-01-20 NOTE — Telephone Encounter (Signed)
States she received a fax for the same results that they had already received.  States they are missing results that they specifically sent orders for:  Hemoglobin, PTH, phosphorus.  Asking for a call [phone], Joan Erickson

## 2013-01-20 NOTE — Telephone Encounter (Signed)
It looks like the requested labs were not drawn. Pt has an appointment with [facility] on 01/24/13. Please advise

## 2013-01-20 NOTE — Telephone Encounter (Signed)
States left vm 12/30 regarding pt lab work.  States they ordered labs for pt to have here on 12/16, received results 12/29.  States they did not receive results for all blood work that was to be drawn.    Missing results for:  Hemoglobin PTH Phosphorus  Pt has appt on 1/6 with [facility] and she needs to speak with you regarding lab work.

## 2013-02-08 ENCOUNTER — Other Ambulatory Visit (INDEPENDENT_AMBULATORY_CARE_PROVIDER_SITE_OTHER): Payer: Medicare Other

## 2013-02-08 ENCOUNTER — Other Ambulatory Visit: Payer: Self-pay | Admitting: Internal Medicine

## 2013-02-08 DIAGNOSIS — N184 Chronic kidney disease, stage 4 (severe): Secondary | ICD-10-CM

## 2013-02-08 DIAGNOSIS — N058 Unspecified nephritic syndrome with other morphologic changes: Secondary | ICD-10-CM

## 2013-02-08 DIAGNOSIS — N289 Disorder of kidney and ureter, unspecified: Secondary | ICD-10-CM

## 2013-02-08 DIAGNOSIS — E1129 Type 2 diabetes mellitus with other diabetic kidney complication: Secondary | ICD-10-CM

## 2013-02-08 LAB — PHOSPHORUS: PHOSPHORUS: 3.2 mg/dL (ref 2.3–4.6)

## 2013-02-08 LAB — HEMOGLOBIN A1C: HEMOGLOBIN A1C: 6.6 % — AB (ref 4.6–6.5)

## 2013-02-08 NOTE — Progress Notes (Signed)
Order placed for hgb.

## 2013-02-09 ENCOUNTER — Ambulatory Visit: Payer: Medicare Other

## 2013-02-09 DIAGNOSIS — N289 Disorder of kidney and ureter, unspecified: Secondary | ICD-10-CM

## 2013-02-09 LAB — PARATHYROID HORMONE, INTACT (NO CA): PTH: 134.4 pg/mL — ABNORMAL HIGH (ref 14.0–72.0)

## 2013-02-09 LAB — HEMOGLOBIN: HEMOGLOBIN: 12.8 g/dL (ref 12.0–15.0)

## 2013-03-20 ENCOUNTER — Telehealth: Payer: Self-pay | Admitting: Internal Medicine

## 2013-03-20 ENCOUNTER — Telehealth: Payer: Self-pay | Admitting: *Deleted

## 2013-03-20 NOTE — Telephone Encounter (Signed)
Contour is not covered please send new Rx for machine, test strip and lancets

## 2013-03-20 NOTE — Telephone Encounter (Signed)
Pharmacy Note:  Contour is not covered please send new Rx for machine, test strip and lancets

## 2013-03-20 NOTE — Telephone Encounter (Signed)
I will need to know what machine and test strips her insurance will cover.  I do not mind writing her a prescription.  If she does not know, I can write a generic rx for a glucometer and test strips and see if they will cover.  Thanks.

## 2013-03-20 NOTE — Telephone Encounter (Signed)
Duplicate, request sent to Dr. Nicki Reaper. See other phone note

## 2013-03-20 NOTE — Telephone Encounter (Signed)
Pt states there are some new meters out for taking blood sugar and she wants to know if Dr. Nicki Reaper can phone in a prescription to [facility].  The last one she had to pay 127$ for strips.  They told her she has to have the new meter so that her insurance will cover.  States she does not know the name of the machine, she thinks it speaks back to you when you take your blood sugar.  She is hoping that we know what the machine is and can phone it in with strips

## 2013-03-21 ENCOUNTER — Other Ambulatory Visit: Payer: Self-pay | Admitting: Internal Medicine

## 2013-03-21 MED ORDER — GLUCOSE BLOOD VI STRP: ORAL_STRIP | Status: DC

## 2013-03-21 MED ORDER — ONETOUCH ULTRA SYSTEM W/DEVICE KIT: 1.0000 | PACK | Freq: Once | Status: AC

## 2013-03-21 NOTE — Telephone Encounter (Signed)
Spoke with pt, advised to call insurance and find out name of glucometer they will cover. Pt verbalized understanding and will call back later with name.

## 2013-03-21 NOTE — Telephone Encounter (Signed)
Rx sent to pharmacy by escript. Pt notified  

## 2013-03-21 NOTE — Telephone Encounter (Signed)
The patient called back to inform the physician to call into the pharmacy a prescription for a One Touch Ultra glucose meter and test strips.

## 2013-03-27 ENCOUNTER — Telehealth: Payer: Self-pay | Admitting: Internal Medicine

## 2013-03-27 NOTE — Telephone Encounter (Signed)
Pt called wanting dr Nicki Reaper to  Call her  Pt would not tell me what she wanted to talk to be. She stated she has a pull muscle  She wanted to check with dr Nicki Reaper  About this And has several other things.   Results on last labs

## 2013-03-27 NOTE — Telephone Encounter (Signed)
Tried calling pts cell number.  Not in service.  Tried calling her home phone multiple times.  No answer initially, then stated no mailbox set up.   If able to reach pt, need more info.   Thanks.

## 2013-03-28 NOTE — Telephone Encounter (Signed)
LMTCB on voicemail-needing more information

## 2013-04-11 ENCOUNTER — Other Ambulatory Visit (INDEPENDENT_AMBULATORY_CARE_PROVIDER_SITE_OTHER): Payer: Medicare Other

## 2013-04-11 DIAGNOSIS — N289 Disorder of kidney and ureter, unspecified: Secondary | ICD-10-CM

## 2013-04-11 DIAGNOSIS — I4891 Unspecified atrial fibrillation: Secondary | ICD-10-CM

## 2013-04-11 LAB — HEMOGLOBIN: HEMOGLOBIN: 11.6 g/dL — AB (ref 12.0–15.0)

## 2013-04-12 LAB — DIGOXIN LEVEL: Digoxin Level: <0.1 ng/mL — ABNORMAL LOW (ref 0.8–2.0)

## 2013-04-13 ENCOUNTER — Other Ambulatory Visit: Payer: Self-pay | Admitting: Internal Medicine

## 2013-04-17 ENCOUNTER — Encounter: Payer: Self-pay | Admitting: Internal Medicine

## 2013-04-17 ENCOUNTER — Ambulatory Visit (INDEPENDENT_AMBULATORY_CARE_PROVIDER_SITE_OTHER): Payer: Medicare Other | Admitting: Internal Medicine

## 2013-04-17 VITALS — BP 142/70 | HR 83 | Temp 98.1°F | Resp 16 | Ht 58.5 in | Wt 142.8 lb

## 2013-04-17 DIAGNOSIS — D649 Anemia, unspecified: Secondary | ICD-10-CM | POA: Insufficient documentation

## 2013-04-17 DIAGNOSIS — E7800 Pure hypercholesterolemia, unspecified: Secondary | ICD-10-CM

## 2013-04-17 DIAGNOSIS — Z1239 Encounter for other screening for malignant neoplasm of breast: Secondary | ICD-10-CM

## 2013-04-17 DIAGNOSIS — D751 Secondary polycythemia: Secondary | ICD-10-CM

## 2013-04-17 DIAGNOSIS — I10 Essential (primary) hypertension: Secondary | ICD-10-CM

## 2013-04-17 DIAGNOSIS — E010 Iodine-deficiency related diffuse (endemic) goiter: Secondary | ICD-10-CM

## 2013-04-17 DIAGNOSIS — Z139 Encounter for screening, unspecified: Secondary | ICD-10-CM

## 2013-04-17 DIAGNOSIS — E1121 Type 2 diabetes mellitus with diabetic nephropathy: Secondary | ICD-10-CM

## 2013-04-17 DIAGNOSIS — I4891 Unspecified atrial fibrillation: Secondary | ICD-10-CM

## 2013-04-17 DIAGNOSIS — Z23 Encounter for immunization: Secondary | ICD-10-CM

## 2013-04-17 DIAGNOSIS — D45 Polycythemia vera: Secondary | ICD-10-CM

## 2013-04-17 DIAGNOSIS — N058 Unspecified nephritic syndrome with other morphologic changes: Secondary | ICD-10-CM

## 2013-04-17 DIAGNOSIS — J45909 Unspecified asthma, uncomplicated: Secondary | ICD-10-CM

## 2013-04-17 DIAGNOSIS — E1129 Type 2 diabetes mellitus with other diabetic kidney complication: Secondary | ICD-10-CM

## 2013-04-17 DIAGNOSIS — N289 Disorder of kidney and ureter, unspecified: Secondary | ICD-10-CM

## 2013-04-17 DIAGNOSIS — E049 Nontoxic goiter, unspecified: Secondary | ICD-10-CM

## 2013-04-17 DIAGNOSIS — Z86010 Personal history of colonic polyps: Secondary | ICD-10-CM

## 2013-04-17 LAB — BASIC METABOLIC PANEL
BUN: 38 mg/dL — AB (ref 6–23)
CO2: 25 meq/L (ref 19–32)
Calcium: 9.9 mg/dL (ref 8.4–10.5)
Chloride: 102 mEq/L (ref 96–112)
Creatinine, Ser: 2.8 mg/dL — ABNORMAL HIGH (ref 0.4–1.2)
GFR: 21.2 mL/min — ABNORMAL LOW (ref >60.00)
Glucose, Bld: 104 mg/dL — ABNORMAL HIGH (ref 70–99)
Potassium: 4.3 mEq/L (ref 3.5–5.1)
Sodium: 135 mEq/L (ref 135–145)

## 2013-04-17 LAB — IBC PANEL
IRON: 60 ug/dL (ref 42–145)
Saturation Ratios: 17.3 % — ABNORMAL LOW (ref 20.0–50.0)
Transferrin: 247.9 mg/dL (ref 212.0–360.0)

## 2013-04-17 LAB — FERRITIN: FERRITIN: 25.1 ng/mL (ref 10.0–291.0)

## 2013-04-17 LAB — VITAMIN B12: Vitamin B-12: 385 pg/mL (ref 211–911)

## 2013-04-17 NOTE — Patient Instructions (Signed)
Stop glipizide.  Monitor sugars.

## 2013-04-17 NOTE — Progress Notes (Signed)
Pre visit review using our clinic review tool, if applicable. No additional management support is needed unless otherwise documented below in the visit note. 

## 2013-04-18 LAB — CBC WITH DIFFERENTIAL/PLATELET
BASOS ABS: 0 10*3/uL (ref 0.0–0.1)
Basophils Relative: 0.1 % (ref 0.0–3.0)
Eosinophils Absolute: 0 10*3/uL (ref 0.0–0.7)
Eosinophils Relative: 0.8 % (ref 0.0–5.0)
HCT: 38.3 % (ref 36.0–46.0)
Hemoglobin: 12.8 g/dL (ref 12.0–15.0)
LYMPHS ABS: 0.8 10*3/uL (ref 0.7–4.0)
Lymphocytes Relative: 13.7 % (ref 12.0–46.0)
MCHC: 33.4 g/dL (ref 30.0–36.0)
MCV: 88.3 fl (ref 78.0–100.0)
MONO ABS: 0.7 10*3/uL (ref 0.1–1.0)
Monocytes Relative: 11.6 % (ref 3.0–12.0)
Neutro Abs: 4.5 10*3/uL (ref 1.4–7.7)
Neutrophils Relative %: 73.8 % (ref 43.0–77.0)
PLATELETS: 248 10*3/uL (ref 150.0–400.0)
RBC: 4.34 Mil/uL (ref 3.87–5.11)
RDW: 14.5 % (ref 11.5–14.6)
WBC: 6 10*3/uL (ref 4.5–10.5)

## 2013-04-18 LAB — VARICELLA ZOSTER ANTIBODY, IGG: Varicella IgG: 128.3 Index (ref <135.00)

## 2013-04-19 ENCOUNTER — Encounter: Payer: Self-pay | Admitting: *Deleted

## 2013-04-20 ENCOUNTER — Other Ambulatory Visit: Payer: Self-pay | Admitting: Internal Medicine

## 2013-04-21 ENCOUNTER — Encounter: Payer: Self-pay | Admitting: Internal Medicine

## 2013-04-21 NOTE — Assessment & Plan Note (Signed)
Was followed by Dr Vladimir Creeks.  Had intermittent phlebotomy.  Follow.

## 2013-04-21 NOTE — Progress Notes (Signed)
Subjective:    Patient ID: Joan Erickson, female    DOB: 09-25-1931, 80 y.o.   MRN: [id number]  HPI 80 year old female with past history of asthma, hypertension, hyperglycemia, atrial fibrillation and is s/p right nephrectomy for xanthogranulomatous pyelonephritis who comes in today for a scheduled follow up.   She states she is doing well and feels good.  She is exercising. She is going to [employer].   Her husband is doing better.  Stress is better.  Still following with nephrology.  She is checking her sugars now.  Recorded sugars averaging 90-130.  Sees opthalmology.  Up to date with eye checks.   Blood pressure doing well.  Has noticed some pain in her right leg - to her groin.  Started two weeks ago.  Flares occasionally.  No back pain.  No pain today.  Able to exercise and walk without any problems.      Past Medical History  Diagnosis Date  . Hypertension   . Asthma   . GERD (gastroesophageal reflux disease)   . Hypercholesterolemia   . History of colon polyps   . Xanthogranulomatous pyelonephritis     s/p right nephrectomy  . Hypogammaglobulinemia     IgA  . Atrial fibrillation     Current Outpatient Prescriptions on File Prior to Visit  Medication Sig Dispense Refill  . ADVAIR DISKUS 250-50 MCG/DOSE AEPB INHALE 1 PUFF TWICE A DAY  60 each  5  . Blood Glucose Monitoring Suppl (ONE TOUCH ULTRA SYSTEM KIT) W/DEVICE KIT 1 kit by Does not apply route once.  1 each  0  . chlorthalidone (HYGROTON) 25 MG tablet Take 1 tablet (25 mg total) by mouth daily.  30 tablet  5  . Cholecalciferol (VITAMIN D) 2000 UNITS tablet Take 2,000 Units by mouth daily.      Marland Kitchen diltiazem (TIAZAC) 360 MG 24 hr capsule TAKE 1 CAPSULE (360 MG TOTAL) BY MOUTH DAILY.  30 capsule  5  . fluticasone (FLONASE) 50 MCG/ACT nasal spray USE 2 SPRAYS EACH NOSTRIL EVERY DAY  48 g  1  . glipiZIDE (GLUCOTROL XL) 5 MG 24 hr tablet TAKE 1 TABLET (5 MG TOTAL) BY MOUTH DAILY. DO NOT TAKE WITH AMARYL. STOP AMARYL  30 tablet  5  .  glucose blood test strip One Touch Ultra test strip. Check sugar bid. Dx 250.40  100 each  5  . linagliptin (TRADJENTA) 5 MG TABS tablet Take 5 mg by mouth daily.      Marland Kitchen losartan (COZAAR) 100 MG tablet TAKE 1 TABLET BY MOUTH ONCE A DAY  30 tablet  5  . montelukast (SINGULAIR) 10 MG tablet TAKE 1 TABLET EVERY DAY  30 tablet  5  . PROAIR HFA 108 (90 BASE) MCG/ACT inhaler INHALE 2 PUFF EVERY SIX HOURS AS NEEDED  8.5 each  2  . warfarin (COUMADIN) 1 MG tablet Take 1 mg by mouth as directed.      . warfarin (COUMADIN) 5 MG tablet As directed       No current facility-administered medications on file prior to visit.    Review of Systems Patient denies any headache, lightheadedness or dizziness.  No sinus or allergy symptoms.  No chest pain, tightness or palpitations.  No increased heart rate.  No increased shortness of breath, cough or congestion.   No sob.  No chest tightness.   No nausea or vomiting.  No abdominal pain or cramping.  No bowel change, such as diarrhea, constipation, BRBPR or melana.  No urine change.  Sugars have improved.  See above.  Has started back exercising.  Back at [employer].   Feels good.  Blood pressure doing well.  Right groin pain as outlined.        Objective:   Physical Exam  Filed Vitals:   04/17/13 1322  BP: 142/70  Pulse: 83  Temp: 98.1 F (36.7 C)  Resp: 16   Blood pressure recheck;  140/62, pulse 54  80 year old female in no acute distress.   HEENT:  Nares- clear.  Oropharynx - without lesions. NECK:  Supple.  Nontender.  No audible bruit.  HEART:  Appears to be regular. LUNGS:  No crackles or wheezing audible.  Respirations even and unlabored.  RADIAL PULSE:  Equal bilaterally.    BREASTS:  No nipple discharge or nipple retraction present.  Could not appreciate any distinct nodules or axillary adenopathy.  ABDOMEN:  Soft, nontender.  Bowel sounds present and normal.  No audible abdominal bruit.  GU:  Not performed.     EXTREMITIES:  No increased edema  present.  DP pulses palpable and equal bilaterally.      FEET:  No lesions.        Assessment & Plan:  HYPONATREMIA.  Follow sodium.    HEALTH MAINTENANCE.  Physical today.  She is s/p hysterectomy.  Colonoscopy 07/16/06 revealed sessile polyp that was removed.  Recommended repeat in five years.  Had follow up colonoscopy that revealed multiple polyps.  Last follow up colonoscopy 10/31/12.  See report.  Recommended f/u colonoscopy in three years.  Mammogram 08/26/11 - BiRADS I.   Schedule f/u mammogram.

## 2013-04-21 NOTE — Assessment & Plan Note (Signed)
Breathing stable. Continue inhalers.  Follow.    

## 2013-04-21 NOTE — Assessment & Plan Note (Signed)
Blood pressure has been doing better.  Same meds.  Follow.   

## 2013-04-21 NOTE — Assessment & Plan Note (Signed)
Saw Dr Paul.  Biopsy negative.  Sees Dr Paul.  Had follow up ultrasound.    

## 2013-04-21 NOTE — Assessment & Plan Note (Signed)
Follow cbc.  

## 2013-04-21 NOTE — Assessment & Plan Note (Signed)
Appears to be in sinus rhythm today.  Sees Dr Fath.  Currently asymptomatic.  Follow.  Off digoxin.    

## 2013-04-21 NOTE — Assessment & Plan Note (Signed)
Colonoscopy as outlined.  F/u in three years. (due 10/17).  

## 2013-04-21 NOTE — Assessment & Plan Note (Signed)
On pravastatin.  Low cholesterol diet and exercise.  Follow.  Follow lipid panel and liver function.    

## 2013-04-21 NOTE — Assessment & Plan Note (Signed)
Sugars better.  On Glipizide XL 5mg  q day and Tradjenta.    Since significant improvement, will stop glipizide and follow sugars.  Monitor for lows.  Discussed the need to eat regular meals and eat a snack before bed.  Is exercising now.  Follow.

## 2013-04-21 NOTE — Assessment & Plan Note (Signed)
Creatinine has been stable.  Latest Cr 2.5 - 2.6.  Avoid nephrotoxic agents.  Follow renal function.

## 2013-04-25 ENCOUNTER — Encounter: Payer: Self-pay | Admitting: Emergency Medicine

## 2013-05-08 ENCOUNTER — Encounter: Payer: Self-pay | Admitting: Internal Medicine

## 2013-05-08 ENCOUNTER — Ambulatory Visit: Payer: Self-pay | Admitting: Internal Medicine

## 2013-05-08 LAB — HM MAMMOGRAPHY

## 2013-05-10 ENCOUNTER — Other Ambulatory Visit: Payer: Self-pay | Admitting: Internal Medicine

## 2013-05-17 ENCOUNTER — Ambulatory Visit: Payer: Self-pay | Admitting: Internal Medicine

## 2013-05-17 LAB — HM MAMMOGRAPHY: HM Mammogram: NEGATIVE

## 2013-05-18 ENCOUNTER — Encounter: Payer: Self-pay | Admitting: Internal Medicine

## 2013-05-22 ENCOUNTER — Ambulatory Visit (INDEPENDENT_AMBULATORY_CARE_PROVIDER_SITE_OTHER): Payer: Medicare Other | Admitting: Internal Medicine

## 2013-05-22 ENCOUNTER — Encounter: Payer: Self-pay | Admitting: Internal Medicine

## 2013-05-22 VITALS — BP 130/70 | HR 83 | Temp 98.3°F | Ht 58.5 in | Wt 140.2 lb

## 2013-05-22 DIAGNOSIS — D751 Secondary polycythemia: Secondary | ICD-10-CM

## 2013-05-22 DIAGNOSIS — D45 Polycythemia vera: Secondary | ICD-10-CM

## 2013-05-22 DIAGNOSIS — E1129 Type 2 diabetes mellitus with other diabetic kidney complication: Secondary | ICD-10-CM

## 2013-05-22 DIAGNOSIS — E049 Nontoxic goiter, unspecified: Secondary | ICD-10-CM

## 2013-05-22 DIAGNOSIS — I4891 Unspecified atrial fibrillation: Secondary | ICD-10-CM

## 2013-05-22 DIAGNOSIS — J45909 Unspecified asthma, uncomplicated: Secondary | ICD-10-CM

## 2013-05-22 DIAGNOSIS — E1121 Type 2 diabetes mellitus with diabetic nephropathy: Secondary | ICD-10-CM

## 2013-05-22 DIAGNOSIS — N289 Disorder of kidney and ureter, unspecified: Secondary | ICD-10-CM

## 2013-05-22 DIAGNOSIS — D649 Anemia, unspecified: Secondary | ICD-10-CM

## 2013-05-22 DIAGNOSIS — Z86010 Personal history of colonic polyps: Secondary | ICD-10-CM

## 2013-05-22 DIAGNOSIS — N058 Unspecified nephritic syndrome with other morphologic changes: Secondary | ICD-10-CM

## 2013-05-22 DIAGNOSIS — E010 Iodine-deficiency related diffuse (endemic) goiter: Secondary | ICD-10-CM

## 2013-05-22 DIAGNOSIS — I10 Essential (primary) hypertension: Secondary | ICD-10-CM

## 2013-05-22 DIAGNOSIS — E7800 Pure hypercholesterolemia, unspecified: Secondary | ICD-10-CM

## 2013-05-22 NOTE — Progress Notes (Signed)
Pre visit review using our clinic review tool, if applicable. No additional management support is needed unless otherwise documented below in the visit note. 

## 2013-05-23 ENCOUNTER — Encounter: Payer: Self-pay | Admitting: Internal Medicine

## 2013-05-23 NOTE — Progress Notes (Signed)
Subjective:    Patient ID: Joan Erickson, female    DOB: 02-Sep-1931, 80 y.o.   MRN: [id number]  HPI 80 year old female with past history of asthma, hypertension, hyperglycemia, atrial fibrillation and is s/p right nephrectomy for xanthogranulomatous pyelonephritis who comes in today for a scheduled follow up.   She states she is doing well and feels good.  She is exercising.  She is going to [employer].   Still following with nephrology.  She is checking her sugars now.  Sugars averaging 120-140s.[facility].  Up to date with eye checks.   Blood pressure doing well.  The previous pain in her right leg has improved.  Is continuing to get better.  Desires to continue to monitor.   Able to exercise and walk without any problems.      Past Medical History  Diagnosis Date  . Hypertension   . Asthma   . GERD (gastroesophageal reflux disease)   . Hypercholesterolemia   . History of colon polyps   . Xanthogranulomatous pyelonephritis     s/p right nephrectomy  . Hypogammaglobulinemia     IgA  . Atrial fibrillation     Current Outpatient Prescriptions on File Prior to Visit  Medication Sig Dispense Refill  . ADVAIR DISKUS 250-50 MCG/DOSE AEPB INHALE 1 PUFF TWICE A DAY  60 each  5  . Blood Glucose Monitoring Suppl (ONE TOUCH ULTRA SYSTEM KIT) W/DEVICE KIT 1 kit by Does not apply route once.  1 each  0  . chlorthalidone (HYGROTON) 25 MG tablet Take 1 tablet (25 mg total) by mouth daily.  30 tablet  5  . Cholecalciferol (VITAMIN D) 2000 UNITS tablet Take 2,000 Units by mouth daily.      Marland Kitchen diltiazem (TIAZAC) 360 MG 24 hr capsule TAKE 1 CAPSULE (360 MG TOTAL) BY MOUTH DAILY.  30 capsule  5  . fluticasone (FLONASE) 50 MCG/ACT nasal spray USE 2 SPRAYS EACH NOSTRIL EVERY DAY  48 g  1  . glucose blood test strip One Touch Ultra test strip. Check sugar bid. Dx 250.40  100 each  5  . linagliptin (TRADJENTA) 5 MG TABS tablet Take 5 mg by mouth daily.      Marland Kitchen losartan (COZAAR) 100 MG tablet TAKE 1 TABLET  BY MOUTH ONCE A DAY  30 tablet  5  . montelukast (SINGULAIR) 10 MG tablet TAKE 1 TABLET EVERY DAY  30 tablet  5  . pravastatin (PRAVACHOL) 20 MG tablet TAKE 1 TABLET (20 MG TOTAL) BY MOUTH DAILY.  30 tablet  5  . PROAIR HFA 108 (90 BASE) MCG/ACT inhaler INHALE 2 PUFF EVERY SIX HOURS AS NEEDED  8.5 each  2  . warfarin (COUMADIN) 1 MG tablet Take 0.5 mg by mouth as directed.       . warfarin (COUMADIN) 5 MG tablet As directed       No current facility-administered medications on file prior to visit.    Review of Systems Patient denies any headache, lightheadedness or dizziness.  No sinus or allergy symptoms.  No chest pain, tightness or palpitations.  No increased heart rate.  No increased shortness of breath, cough or congestion.   No sob.  No chest tightness.   No nausea or vomiting.  No abdominal pain or cramping.  No bowel change, such as diarrhea, constipation, BRBPR or melana.  No urine change.  Sugars as outlined.  Is exercising.   Feels good.  Blood pressure doing well.  Objective:   Physical Exam  Filed Vitals:   05/22/13 1138  BP: 130/70  Pulse: 83  Temp: 98.3 F (36.8 C)   Blood pressure recheck;  101-60/48  80 year old female in no acute distress.   HEENT:  Nares- clear.  Oropharynx - without lesions. NECK:  Supple.  Nontender.  No audible bruit.  HEART:  Appears to be regular. LUNGS:  No crackles or wheezing audible.  Respirations even and unlabored.  RADIAL PULSE:  Equal bilaterally.  ABDOMEN:  Soft, nontender.  Bowel sounds present and normal.  No audible abdominal bruit.     EXTREMITIES:  No increased edema present.  DP pulses palpable and equal bilaterally.      FEET:  No lesions.        Assessment & Plan:  HYPONATREMIA.  Follow sodium.    HEALTH MAINTENANCE.  Physical 04/17/13.  She is s/p hysterectomy.  Colonoscopy 07/16/06 revealed sessile polyp that was removed.  Recommended repeat in five years.  Had follow up colonoscopy that revealed multiple polyps.   Last follow up colonoscopy 10/31/12.  See report.  Recommended f/u colonoscopy in three years.  Mammogram recently recommended f/u views.  F/u right breast mammogram - BiRADS I.

## 2013-05-23 NOTE — Assessment & Plan Note (Signed)
Follow cbc.  

## 2013-05-23 NOTE — Assessment & Plan Note (Signed)
Creatinine has been stable.  Latest Cr 2.5 - 2.8.  Avoid nephrotoxic agents.  Follow renal function.  Continue to f/u with nephrology.   

## 2013-05-23 NOTE — Assessment & Plan Note (Signed)
Was followed by hematology.  Had intermittent phlebotomy.  Follow.

## 2013-05-23 NOTE — Assessment & Plan Note (Signed)
Appears to be in sinus rhythm today.  Sees Dr Ubaldo Glassing.  Currently asymptomatic.  Follow.  Off digoxin.

## 2013-05-23 NOTE — Assessment & Plan Note (Signed)
On pravastatin.  Low cholesterol diet and exercise.  Follow.  Follow lipid panel and liver function.    

## 2013-05-23 NOTE — Assessment & Plan Note (Signed)
Breathing stable. Continue inhalers.  Follow.    

## 2013-05-23 NOTE — Assessment & Plan Note (Signed)
Colonoscopy as outlined.  F/u in three years. (due 10/17).  

## 2013-05-23 NOTE — Assessment & Plan Note (Signed)
Saw Dr Paul.  Biopsy negative.  Sees Dr Paul.  Had follow up ultrasound.    

## 2013-05-23 NOTE — Assessment & Plan Note (Signed)
Sugars better.  On Tradjenta.   Discussed the need to eat regular meals and eat a snack before bed.  Is exercising now.  Follow.  Check met b and a1c.       

## 2013-05-23 NOTE — Assessment & Plan Note (Signed)
Blood pressure has been doing better.  Same meds.  Follow.   

## 2013-05-30 ENCOUNTER — Other Ambulatory Visit (INDEPENDENT_AMBULATORY_CARE_PROVIDER_SITE_OTHER): Payer: Medicare Other

## 2013-05-30 DIAGNOSIS — D751 Secondary polycythemia: Secondary | ICD-10-CM

## 2013-05-30 DIAGNOSIS — D649 Anemia, unspecified: Secondary | ICD-10-CM

## 2013-05-30 DIAGNOSIS — I4891 Unspecified atrial fibrillation: Secondary | ICD-10-CM

## 2013-05-30 DIAGNOSIS — D45 Polycythemia vera: Secondary | ICD-10-CM

## 2013-05-30 DIAGNOSIS — E1121 Type 2 diabetes mellitus with diabetic nephropathy: Secondary | ICD-10-CM

## 2013-05-30 DIAGNOSIS — E1129 Type 2 diabetes mellitus with other diabetic kidney complication: Secondary | ICD-10-CM

## 2013-05-30 DIAGNOSIS — N058 Unspecified nephritic syndrome with other morphologic changes: Secondary | ICD-10-CM

## 2013-05-30 LAB — CBC WITH DIFFERENTIAL/PLATELET
Basophils Absolute: 0 10*3/uL (ref 0.0–0.1)
Basophils Relative: 0.3 % (ref 0.0–3.0)
EOS ABS: 0.1 10*3/uL (ref 0.0–0.7)
Eosinophils Relative: 1.3 % (ref 0.0–5.0)
HCT: 35.9 % — ABNORMAL LOW (ref 36.0–46.0)
Hemoglobin: 12.1 g/dL (ref 12.0–15.0)
LYMPHS PCT: 14.1 % (ref 12.0–46.0)
Lymphs Abs: 0.9 10*3/uL (ref 0.7–4.0)
MCHC: 33.8 g/dL (ref 30.0–36.0)
MCV: 88.2 fl (ref 78.0–100.0)
Monocytes Absolute: 0.8 10*3/uL (ref 0.1–1.0)
Monocytes Relative: 11.9 % (ref 3.0–12.0)
NEUTROS PCT: 72.4 % (ref 43.0–77.0)
Neutro Abs: 4.8 10*3/uL (ref 1.4–7.7)
Platelets: 223 10*3/uL (ref 150.0–400.0)
RBC: 4.07 Mil/uL (ref 3.87–5.11)
RDW: 14.5 % (ref 11.5–15.5)
WBC: 6.6 10*3/uL (ref 4.0–10.5)

## 2013-05-30 LAB — TSH: TSH: 2.59 u[IU]/mL (ref 0.35–4.50)

## 2013-05-30 LAB — BASIC METABOLIC PANEL
BUN: 46 mg/dL — AB (ref 6–23)
CALCIUM: 10.2 mg/dL (ref 8.4–10.5)
CHLORIDE: 103 meq/L (ref 96–112)
CO2: 25 meq/L (ref 19–32)
CREATININE: 2.4 mg/dL — AB (ref 0.4–1.2)
GFR: 25.13 mL/min — ABNORMAL LOW (ref >60.00)
GLUCOSE: 107 mg/dL — AB (ref 70–99)
Potassium: 4.4 mEq/L (ref 3.5–5.1)
Sodium: 136 mEq/L (ref 135–145)

## 2013-05-30 LAB — HEMOGLOBIN A1C: Hgb A1c MFr Bld: 6.6 % — ABNORMAL HIGH (ref 4.6–6.5)

## 2013-05-30 LAB — LIPID PANEL
Cholesterol: 153 mg/dL (ref 0–200)
HDL: 61 mg/dL (ref >39.00)
LDL Cholesterol: 80 mg/dL (ref 0–99)
TRIGLYCERIDES: 59 mg/dL (ref 0.0–149.0)
Total CHOL/HDL Ratio: 3
VLDL: 11.8 mg/dL (ref 0.0–40.0)

## 2013-05-30 LAB — HEPATIC FUNCTION PANEL
ALK PHOS: 44 U/L (ref 39–117)
ALT: 19 U/L (ref 0–35)
AST: 22 U/L (ref 0–37)
Albumin: 3.5 g/dL (ref 3.5–5.2)
BILIRUBIN DIRECT: 0 mg/dL (ref 0.0–0.3)
Total Bilirubin: 0.6 mg/dL (ref 0.2–1.2)
Total Protein: 6.8 g/dL (ref 6.0–8.3)

## 2013-06-01 ENCOUNTER — Other Ambulatory Visit: Payer: Self-pay | Admitting: Internal Medicine

## 2013-06-05 ENCOUNTER — Other Ambulatory Visit: Payer: Self-pay | Admitting: Internal Medicine

## 2013-07-01 ENCOUNTER — Other Ambulatory Visit: Payer: Self-pay | Admitting: Internal Medicine

## 2013-07-05 ENCOUNTER — Telehealth: Payer: Self-pay | Admitting: *Deleted

## 2013-07-05 DIAGNOSIS — N184 Chronic kidney disease, stage 4 (severe): Secondary | ICD-10-CM

## 2013-07-05 NOTE — Telephone Encounter (Signed)
Received a fax requesting that Mrs. Bedwell have the following labs drawn at our office & results faxed to them: BMP,Hgb, Phosphorus, & PTH (Dx-585.4 for all labs). Will schedule pt an appt once labs are ordered.

## 2013-07-06 NOTE — Telephone Encounter (Signed)
I have placed orders for the labs.   Please schedule a lab appt.  Thanks.

## 2013-07-06 NOTE — Telephone Encounter (Signed)
Left message for pt to return my call.

## 2013-07-07 ENCOUNTER — Other Ambulatory Visit: Payer: Self-pay | Admitting: Internal Medicine

## 2013-07-10 NOTE — Telephone Encounter (Signed)
LMTCB & schedule a lab appt

## 2013-07-11 NOTE — Telephone Encounter (Signed)
Lab appt scheduled for 07/12/13

## 2013-07-12 ENCOUNTER — Other Ambulatory Visit (INDEPENDENT_AMBULATORY_CARE_PROVIDER_SITE_OTHER): Payer: Medicare Other

## 2013-07-12 DIAGNOSIS — N184 Chronic kidney disease, stage 4 (severe): Secondary | ICD-10-CM

## 2013-07-12 LAB — BASIC METABOLIC PANEL
BUN: 55 mg/dL — AB (ref 6–23)
CO2: 29 mEq/L (ref 19–32)
CREATININE: 2.3 mg/dL — AB (ref 0.4–1.2)
Calcium: 10.5 mg/dL (ref 8.4–10.5)
Chloride: 102 mEq/L (ref 96–112)
GFR: 26.8 mL/min — AB (ref >60.00)
GLUCOSE: 112 mg/dL — AB (ref 70–99)
Potassium: 4.5 mEq/L (ref 3.5–5.1)
Sodium: 137 mEq/L (ref 135–145)

## 2013-07-12 LAB — HEMOGLOBIN: Hemoglobin: 12.3 g/dL (ref 12.0–15.0)

## 2013-07-12 LAB — PHOSPHORUS: PHOSPHORUS: 3.2 mg/dL (ref 2.3–4.6)

## 2013-07-13 LAB — PARATHYROID HORMONE, INTACT (NO CA): PTH: 112.6 pg/mL — ABNORMAL HIGH (ref 14.0–72.0)

## 2013-07-22 ENCOUNTER — Other Ambulatory Visit: Payer: Self-pay | Admitting: Internal Medicine

## 2013-07-23 ENCOUNTER — Other Ambulatory Visit: Payer: Self-pay | Admitting: Internal Medicine

## 2013-08-14 ENCOUNTER — Other Ambulatory Visit: Payer: Self-pay | Admitting: Internal Medicine

## 2013-08-18 ENCOUNTER — Other Ambulatory Visit: Payer: Self-pay | Admitting: Internal Medicine

## 2013-08-25 ENCOUNTER — Telehealth: Payer: Self-pay | Admitting: *Deleted

## 2013-08-25 NOTE — Telephone Encounter (Signed)
Pt called requesting whether it is ok to take Infinity Vitamins, states the vitamins are for hair growth and strength.  Pt wanted to make sure vitamins are safe due to her having one kidney.  Please advise

## 2013-08-25 NOTE — Telephone Encounter (Signed)
I would have her avoid these supplements.  Thanks.

## 2013-08-25 NOTE — Telephone Encounter (Signed)
Spoke with pt advised of MDs message 

## 2013-08-26 ENCOUNTER — Other Ambulatory Visit: Payer: Self-pay | Admitting: Internal Medicine

## 2013-09-22 ENCOUNTER — Ambulatory Visit (INDEPENDENT_AMBULATORY_CARE_PROVIDER_SITE_OTHER): Payer: Medicare Other | Admitting: Internal Medicine

## 2013-09-22 ENCOUNTER — Encounter: Payer: Self-pay | Admitting: Internal Medicine

## 2013-09-22 VITALS — BP 130/60 | HR 78 | Temp 98.1°F | Ht 58.5 in | Wt 141.5 lb

## 2013-09-22 DIAGNOSIS — E010 Iodine-deficiency related diffuse (endemic) goiter: Secondary | ICD-10-CM

## 2013-09-22 DIAGNOSIS — Z860100 Personal history of colon polyps, unspecified: Secondary | ICD-10-CM

## 2013-09-22 DIAGNOSIS — D45 Polycythemia vera: Secondary | ICD-10-CM

## 2013-09-22 DIAGNOSIS — N289 Disorder of kidney and ureter, unspecified: Secondary | ICD-10-CM

## 2013-09-22 DIAGNOSIS — J4520 Mild intermittent asthma, uncomplicated: Secondary | ICD-10-CM

## 2013-09-22 DIAGNOSIS — N058 Unspecified nephritic syndrome with other morphologic changes: Secondary | ICD-10-CM

## 2013-09-22 DIAGNOSIS — I4891 Unspecified atrial fibrillation: Secondary | ICD-10-CM

## 2013-09-22 DIAGNOSIS — E1121 Type 2 diabetes mellitus with diabetic nephropathy: Secondary | ICD-10-CM

## 2013-09-22 DIAGNOSIS — E7800 Pure hypercholesterolemia, unspecified: Secondary | ICD-10-CM

## 2013-09-22 DIAGNOSIS — I10 Essential (primary) hypertension: Secondary | ICD-10-CM

## 2013-09-22 DIAGNOSIS — Z23 Encounter for immunization: Secondary | ICD-10-CM

## 2013-09-22 DIAGNOSIS — J45909 Unspecified asthma, uncomplicated: Secondary | ICD-10-CM

## 2013-09-22 DIAGNOSIS — D751 Secondary polycythemia: Secondary | ICD-10-CM

## 2013-09-22 DIAGNOSIS — E1129 Type 2 diabetes mellitus with other diabetic kidney complication: Secondary | ICD-10-CM

## 2013-09-22 DIAGNOSIS — E049 Nontoxic goiter, unspecified: Secondary | ICD-10-CM

## 2013-09-22 DIAGNOSIS — D649 Anemia, unspecified: Secondary | ICD-10-CM

## 2013-09-22 NOTE — Progress Notes (Signed)
Pre visit review using our clinic review tool, if applicable. No additional management support is needed unless otherwise documented below in the visit note. 

## 2013-09-25 ENCOUNTER — Encounter: Payer: Self-pay | Admitting: Internal Medicine

## 2013-09-25 NOTE — Assessment & Plan Note (Signed)
Saw Dr Eddie Dibbles.  Biopsy negative.  Sees Dr Eddie Dibbles.  Had follow up ultrasound.

## 2013-09-25 NOTE — Assessment & Plan Note (Signed)
Blood pressure has been doing better.  Same meds.  Follow.

## 2013-09-25 NOTE — Assessment & Plan Note (Signed)
Follow cbc.  

## 2013-09-25 NOTE — Assessment & Plan Note (Signed)
Appears to be in sinus rhythm today.  Sees Dr Ubaldo Glassing.  Currently asymptomatic.  Follow.

## 2013-09-25 NOTE — Assessment & Plan Note (Signed)
Breathing stable. Continue inhalers.  Follow.

## 2013-09-25 NOTE — Assessment & Plan Note (Signed)
On pravastatin.  Low cholesterol diet and exercise.  Follow.  Follow lipid panel and liver function.

## 2013-09-25 NOTE — Assessment & Plan Note (Signed)
Was followed by hematology.  Had intermittent phlebotomy.  Follow cbc.

## 2013-09-25 NOTE — Assessment & Plan Note (Signed)
Creatinine has been stable.  Latest Cr 2.5 - 2.8.  Avoid nephrotoxic agents.  Follow renal function.  Continue to f/u with nephrology.

## 2013-09-25 NOTE — Assessment & Plan Note (Signed)
Colonoscopy as outlined.  F/u in three years. (due 10/17).

## 2013-09-25 NOTE — Assessment & Plan Note (Signed)
Sugars better.  On Tradjenta.  Is exercising now.  Has adjusted her diet.  Follow.  Check met b and a1c.

## 2013-09-25 NOTE — Progress Notes (Signed)
Subjective:    Patient ID: Joan Erickson, female    DOB: [birth year], 80 y.o.   MRN: [id number]  HPI 80 year old female with past history of asthma, hypertension, hyperglycemia, atrial fibrillation and is s/p right nephrectomy for xanthogranulomatous pyelonephritis who comes in today for a scheduled follow up.   She states she is doing well and feels good.  She is exercising.  [employer] closed.  Going to [place] [employer] now.  Doing aerobics.   Still following with nephrology.  She is checking her sugars now.  Sugars averaging 117-130s.[facility].  Up to date with eye checks.   Blood pressure doing well.  Her husband is doing better.  Handling stress well.      Past Medical History  Diagnosis Date  . Hypertension   . Asthma   . GERD (gastroesophageal reflux disease)   . Hypercholesterolemia   . History of colon polyps   . Xanthogranulomatous pyelonephritis     s/p right nephrectomy  . Hypogammaglobulinemia     IgA  . Atrial fibrillation     Current Outpatient Prescriptions on File Prior to Visit  Medication Sig Dispense Refill  . ADVAIR DISKUS 250-50 MCG/DOSE AEPB INHALE 1 PUFF TWICE A DAY  60 each  5  . Blood Glucose Monitoring Suppl (ONE TOUCH ULTRA SYSTEM KIT) W/DEVICE KIT 1 kit by Does not apply route once.  1 each  0  . chlorthalidone (HYGROTON) 25 MG tablet TAKE 1 TABLET (25 MG TOTAL) BY MOUTH DAILY.  30 tablet  5  . Cholecalciferol (VITAMIN D) 2000 UNITS tablet Take 2,000 Units by mouth daily.      Marland Kitchen diltiazem (TIAZAC) 360 MG 24 hr capsule TAKE 1 CAPSULE (360 MG TOTAL) BY MOUTH DAILY.  30 capsule  5  . diltiazem (TIAZAC) 360 MG 24 hr capsule TAKE 1 CAPSULE (360 MG TOTAL) BY MOUTH DAILY.  30 capsule  5  . fluticasone (FLONASE) 50 MCG/ACT nasal spray USE 2 SPRAYS IN EACH NOSTRIL ONCE A DAY  16 g  3  . glipiZIDE (GLUCOTROL XL) 5 MG 24 hr tablet TAKE 1 TABLET (5 MG TOTAL) BY MOUTH DAILY. DO NOT TAKE WITH AMARYL. STOP AMARYL  30 tablet  2  . glucose blood test strip One Touch Ultra  test strip. Check sugar bid. Dx 250.40  100 each  5  . linagliptin (TRADJENTA) 5 MG TABS tablet Take 5 mg by mouth daily.      Marland Kitchen losartan (COZAAR) 100 MG tablet TAKE 1 TABLET BY MOUTH ONCE A DAY  30 tablet  5  . losartan (COZAAR) 100 MG tablet TAKE 1 TABLET BY MOUTH ONCE A DAY  30 tablet  5  . montelukast (SINGULAIR) 10 MG tablet TAKE 1 TABLET EVERY DAY  30 tablet  5  . pravastatin (PRAVACHOL) 20 MG tablet TAKE 1 TABLET (20 MG TOTAL) BY MOUTH DAILY.  30 tablet  5  . PROAIR HFA 108 (90 BASE) MCG/ACT inhaler INHALE 2 PUFF EVERY SIX HOURS AS NEEDED  8.5 each  2  . warfarin (COUMADIN) 1 MG tablet Take 0.5 mg by mouth as directed.       . warfarin (COUMADIN) 5 MG tablet As directed       No current facility-administered medications on file prior to visit.    Review of Systems Patient denies any headache, lightheadedness or dizziness.  No sinus or allergy symptoms.  No chest pain, tightness or palpitations.  No increased heart rate.  No increased shortness of  breath, cough or congestion.   No sob.  No chest tightness.   No nausea or vomiting.  No abdominal pain or cramping.  No bowel change, such as diarrhea, constipation, BRBPR or melana.  No urine change.  Sugars as outlined.  Is exercising.   Feels good.  Blood pressure doing well.  Sugars look good.  Handling stress well.         Objective:   Physical Exam  Filed Vitals:   09/22/13 0919  BP: 130/60  Pulse: 78  Temp: 98.1 F (36.7 C)   Blood pressure recheck;  138/70, pulse 48  80 year old female in no acute distress.   HEENT:  Nares- clear.  Oropharynx - without lesions. NECK:  Supple.  Nontender.  No audible bruit.  HEART:  Appears to be regular. LUNGS:  No crackles or wheezing audible.  Respirations even and unlabored.  RADIAL PULSE:  Equal bilaterally.  ABDOMEN:  Soft, nontender.  Bowel sounds present and normal.  No audible abdominal bruit.     EXTREMITIES:  No increased edema present.  DP pulses palpable and equal bilaterally.       FEET:  No lesions.        Assessment & Plan:  HYPONATREMIA.  Follow sodium.    HEALTH MAINTENANCE.  Physical 04/17/13.  She is s/p hysterectomy.  Colonoscopy 07/16/06 revealed sessile polyp that was removed.  Recommended repeat in five years.  Had follow up colonoscopy that revealed multiple polyps.  Last follow up colonoscopy 10/31/12.  See report.  Recommended f/u colonoscopy in three years.  Mammogram 05/08/13 recently recommended f/u views.  F/u right breast mammogram (05/17/13) - BiRADS I.

## 2013-10-04 ENCOUNTER — Other Ambulatory Visit (INDEPENDENT_AMBULATORY_CARE_PROVIDER_SITE_OTHER): Payer: Medicare Other

## 2013-10-04 DIAGNOSIS — E1121 Type 2 diabetes mellitus with diabetic nephropathy: Secondary | ICD-10-CM

## 2013-10-04 DIAGNOSIS — I10 Essential (primary) hypertension: Secondary | ICD-10-CM

## 2013-10-04 DIAGNOSIS — E1129 Type 2 diabetes mellitus with other diabetic kidney complication: Secondary | ICD-10-CM

## 2013-10-04 DIAGNOSIS — N058 Unspecified nephritic syndrome with other morphologic changes: Secondary | ICD-10-CM

## 2013-10-04 DIAGNOSIS — E7800 Pure hypercholesterolemia, unspecified: Secondary | ICD-10-CM

## 2013-10-04 LAB — HEMOGLOBIN A1C: Hgb A1c MFr Bld: 6.5 % (ref 4.6–6.5)

## 2013-10-05 LAB — BASIC METABOLIC PANEL
BUN: 60 mg/dL — ABNORMAL HIGH (ref 6–23)
CHLORIDE: 103 meq/L (ref 96–112)
CO2: 27 meq/L (ref 19–32)
CREATININE: 2.8 mg/dL — AB (ref 0.4–1.2)
Calcium: 10.1 mg/dL (ref 8.4–10.5)
GFR: 21.09 mL/min — ABNORMAL LOW (ref >60.00)
Glucose, Bld: 146 mg/dL — ABNORMAL HIGH (ref 70–99)
Potassium: 4.8 mEq/L (ref 3.5–5.1)
Sodium: 134 mEq/L — ABNORMAL LOW (ref 135–145)

## 2013-10-05 LAB — LIPID PANEL
CHOL/HDL RATIO: 3
Cholesterol: 166 mg/dL (ref 0–200)
HDL: 58.6 mg/dL (ref >39.00)
LDL Cholesterol: 89 mg/dL (ref 0–99)
NONHDL: 107.4
Triglycerides: 91 mg/dL (ref 0.0–149.0)
VLDL: 18.2 mg/dL (ref 0.0–40.0)

## 2013-10-05 LAB — HEPATIC FUNCTION PANEL
ALK PHOS: 55 U/L (ref 39–117)
ALT: 18 U/L (ref 0–35)
AST: 27 U/L (ref 0–37)
Albumin: 3.7 g/dL (ref 3.5–5.2)
BILIRUBIN TOTAL: 0.5 mg/dL (ref 0.2–1.2)
Bilirubin, Direct: 0 mg/dL (ref 0.0–0.3)
Total Protein: 8 g/dL (ref 6.0–8.3)

## 2013-10-06 ENCOUNTER — Encounter: Payer: Self-pay | Admitting: *Deleted

## 2013-10-06 ENCOUNTER — Other Ambulatory Visit: Payer: Self-pay | Admitting: Internal Medicine

## 2013-10-06 DIAGNOSIS — E871 Hypo-osmolality and hyponatremia: Secondary | ICD-10-CM

## 2013-10-06 NOTE — Progress Notes (Signed)
Order placed for f/u sodium.  ?

## 2013-10-16 ENCOUNTER — Other Ambulatory Visit (INDEPENDENT_AMBULATORY_CARE_PROVIDER_SITE_OTHER): Payer: Medicare Other

## 2013-10-16 DIAGNOSIS — E871 Hypo-osmolality and hyponatremia: Secondary | ICD-10-CM

## 2013-10-16 LAB — SODIUM: Sodium: 134 meq/L — ABNORMAL LOW (ref 135–145)

## 2013-10-17 ENCOUNTER — Other Ambulatory Visit: Payer: Self-pay | Admitting: Internal Medicine

## 2013-10-17 ENCOUNTER — Encounter: Payer: Self-pay | Admitting: *Deleted

## 2013-10-17 DIAGNOSIS — E871 Hypo-osmolality and hyponatremia: Secondary | ICD-10-CM

## 2013-10-17 NOTE — Progress Notes (Signed)
Order placed for f/u sodium.  ?

## 2013-10-29 ENCOUNTER — Other Ambulatory Visit: Payer: Self-pay | Admitting: Internal Medicine

## 2013-11-02 ENCOUNTER — Other Ambulatory Visit: Payer: Self-pay | Admitting: Internal Medicine

## 2013-11-07 ENCOUNTER — Encounter (INDEPENDENT_AMBULATORY_CARE_PROVIDER_SITE_OTHER): Payer: Self-pay

## 2013-11-07 ENCOUNTER — Other Ambulatory Visit (INDEPENDENT_AMBULATORY_CARE_PROVIDER_SITE_OTHER): Payer: Medicare Other

## 2013-11-07 DIAGNOSIS — E871 Hypo-osmolality and hyponatremia: Secondary | ICD-10-CM

## 2013-11-07 LAB — SODIUM: Sodium: 136 mEq/L (ref 135–145)

## 2013-12-17 ENCOUNTER — Other Ambulatory Visit: Payer: Self-pay | Admitting: Internal Medicine

## 2013-12-17 DIAGNOSIS — N184 Chronic kidney disease, stage 4 (severe): Secondary | ICD-10-CM

## 2013-12-17 NOTE — Progress Notes (Signed)
Received notice from [facility] nephrology that pt needed labs drawn.  Labs ordered.

## 2013-12-19 ENCOUNTER — Other Ambulatory Visit (INDEPENDENT_AMBULATORY_CARE_PROVIDER_SITE_OTHER): Payer: Medicare Other

## 2013-12-19 DIAGNOSIS — N184 Chronic kidney disease, stage 4 (severe): Secondary | ICD-10-CM

## 2013-12-19 LAB — PHOSPHORUS: PHOSPHORUS: 3.3 mg/dL (ref 2.3–4.6)

## 2013-12-19 LAB — VITAMIN D 25 HYDROXY (VIT D DEFICIENCY, FRACTURES): VITD: 30.37 ng/mL (ref 30.00–100.00)

## 2013-12-19 LAB — BASIC METABOLIC PANEL
BUN: 56 mg/dL — ABNORMAL HIGH (ref 6–23)
CALCIUM: 10.4 mg/dL (ref 8.4–10.5)
CO2: 21 meq/L (ref 19–32)
Chloride: 107 mEq/L (ref 96–112)
Creatinine, Ser: 2.4 mg/dL — ABNORMAL HIGH (ref 0.4–1.2)
GFR: 24.62 mL/min — ABNORMAL LOW (ref >60.00)
Glucose, Bld: 128 mg/dL — ABNORMAL HIGH (ref 70–99)
Potassium: 5.2 mEq/L — ABNORMAL HIGH (ref 3.5–5.1)
SODIUM: 138 meq/L (ref 135–145)

## 2013-12-19 LAB — MAGNESIUM: Magnesium: 2.3 mg/dL (ref 1.5–2.5)

## 2013-12-19 LAB — HEMOGLOBIN: Hemoglobin: 12.5 g/dL (ref 12.0–15.0)

## 2013-12-19 LAB — ALBUMIN: Albumin: 3.6 g/dL (ref 3.5–5.2)

## 2013-12-19 NOTE — Addendum Note (Signed)
Addended by: Johnsie Cancel on: 12/19/2013 10:44 AM   Modules accepted: Orders

## 2013-12-20 LAB — PROTEIN / CREATININE RATIO, URINE
Creatinine, Urine: 73.6 mg/dL
PROTEIN CREATININE RATIO: 2.8 — AB (ref <0.15)
Total Protein, Urine: 206 mg/dL — ABNORMAL HIGH (ref 5–24)

## 2013-12-20 LAB — PARATHYROID HORMONE, INTACT (NO CA): PTH: 121 pg/mL — ABNORMAL HIGH (ref 14–64)

## 2013-12-27 ENCOUNTER — Other Ambulatory Visit: Payer: Self-pay | Admitting: Internal Medicine

## 2014-01-23 ENCOUNTER — Other Ambulatory Visit: Payer: Self-pay | Admitting: Internal Medicine

## 2014-01-24 ENCOUNTER — Encounter: Payer: Self-pay | Admitting: Internal Medicine

## 2014-01-24 ENCOUNTER — Ambulatory Visit (INDEPENDENT_AMBULATORY_CARE_PROVIDER_SITE_OTHER): Payer: Medicare Other | Admitting: Internal Medicine

## 2014-01-24 VITALS — BP 177/70 | HR 93 | Temp 98.3°F | Ht 58.5 in | Wt 140.2 lb

## 2014-01-24 DIAGNOSIS — D649 Anemia, unspecified: Secondary | ICD-10-CM

## 2014-01-24 DIAGNOSIS — E1121 Type 2 diabetes mellitus with diabetic nephropathy: Secondary | ICD-10-CM

## 2014-01-24 DIAGNOSIS — I10 Essential (primary) hypertension: Secondary | ICD-10-CM | POA: Diagnosis not present

## 2014-01-24 DIAGNOSIS — E7800 Pure hypercholesterolemia, unspecified: Secondary | ICD-10-CM

## 2014-01-24 DIAGNOSIS — Z86010 Personal history of colonic polyps: Secondary | ICD-10-CM

## 2014-01-24 DIAGNOSIS — N184 Chronic kidney disease, stage 4 (severe): Secondary | ICD-10-CM

## 2014-01-24 DIAGNOSIS — I4891 Unspecified atrial fibrillation: Secondary | ICD-10-CM | POA: Diagnosis not present

## 2014-01-24 NOTE — Progress Notes (Signed)
Subjective:    Patient ID: Joan Erickson, female    DOB: Jun 23, 1931, 81 y.o.   MRN: [id number]  HPI 81 year old female with past history of asthma, hypertension, hyperglycemia, atrial fibrillation and is s/p right nephrectomy for xanthogranulomatous pyelonephritis who comes in today for a scheduled follow up.   She states she is doing well and feels good.  She is exercising.  [place] closed.  Going to [employer].  Doing aerobics.   Still following with nephrology.  She is checking her sugars now.   Brought in no recorded sugar readings.  States doing well.  Sees opthalmology.  Up to date with eye checks.   Blood pressure doing well.   Handling stress well.      Past Medical History  Diagnosis Date  . Hypertension   . Asthma   . GERD (gastroesophageal reflux disease)   . Hypercholesterolemia   . History of colon polyps   . Xanthogranulomatous pyelonephritis     s/p right nephrectomy  . Hypogammaglobulinemia     IgA  . Atrial fibrillation     Current Outpatient Prescriptions on File Prior to Visit  Medication Sig Dispense Refill  . ADVAIR DISKUS 250-50 MCG/DOSE AEPB INHALE 1 PUFF TWICE A DAY 60 each 5  . Blood Glucose Monitoring Suppl (ONE TOUCH ULTRA SYSTEM KIT) W/DEVICE KIT 1 kit by Does not apply route once. 1 each 0  . chlorthalidone (HYGROTON) 25 MG tablet TAKE 1 TABLET (25 MG TOTAL) BY MOUTH DAILY. 30 tablet 5  . Cholecalciferol (VITAMIN D) 2000 UNITS tablet Take 2,000 Units by mouth daily.    Marland Kitchen diltiazem (TIAZAC) 360 MG 24 hr capsule TAKE 1 CAPSULE (360 MG TOTAL) BY MOUTH DAILY. 30 capsule 5  . diltiazem (TIAZAC) 360 MG 24 hr capsule TAKE 1 CAPSULE (360 MG TOTAL) BY MOUTH DAILY. 30 capsule 5  . fluticasone (FLONASE) 50 MCG/ACT nasal spray USE 2 SPRAYS IN EACH NOSTRIL ONCE A DAY 16 g 3  . glucose blood test strip One Touch Ultra test strip. Check sugar bid. Dx 250.40 100 each 5  . losartan (COZAAR) 100 MG tablet TAKE 1 TABLET BY MOUTH ONCE A DAY 30 tablet 5  . losartan (COZAAR)  100 MG tablet TAKE 1 TABLET BY MOUTH ONCE A DAY 30 tablet 5  . montelukast (SINGULAIR) 10 MG tablet TAKE 1 TABLET EVERY DAY 30 tablet 5  . pravastatin (PRAVACHOL) 20 MG tablet TAKE 1 TABLET (20 MG TOTAL) BY MOUTH DAILY. 30 tablet 5  . PROAIR HFA 108 (90 BASE) MCG/ACT inhaler INHALE 2 PUFF EVERY SIX HOURS AS NEEDED 8.5 each 2  . TRADJENTA 5 MG TABS tablet TAKE 1 TABLET (5 MG TOTAL) BY MOUTH DAILY. 30 tablet 11  . warfarin (COUMADIN) 1 MG tablet Take 0.5 mg by mouth as directed.     . warfarin (COUMADIN) 5 MG tablet As directed     No current facility-administered medications on file prior to visit.    Review of Systems Patient denies any headache, lightheadedness or dizziness.  No sinus or allergy symptoms.  No chest pain, tightness or palpitations. No increased heart rate.  No increased shortness of breath, cough or congestion.   No chest tightness.   No nausea or vomiting.  No abdominal pain or cramping.  No bowel change, such as diarrhea, constipation, BRBPR or melana.  No urine change.  Sugars have been doing better.  Is exercising.   Feels good.  Blood pressure doing well.  Handling stress well.  Objective:   Physical Exam  Filed Vitals:   01/24/14 0830  BP: 177/70  Pulse: 93  Temp: 98.3 F (36.8 C)   pulse 33  81 year old female in no acute distress.   HEENT:  Nares- clear.  Oropharynx - without lesions. NECK:  Supple.  Nontender.  No audible bruit.  HEART:  Appears to be regular. LUNGS:  No crackles or wheezing audible.  Respirations even and unlabored.  RADIAL PULSE:  Equal bilaterally.  ABDOMEN:  Soft, nontender.  Bowel sounds present and normal.  No audible abdominal bruit.     EXTREMITIES:  No increased edema present.  DP pulses palpable and equal bilaterally.      FEET:  No lesions.        Assessment & Plan:  1. Atrial fibrillation, unspecified Doing well.  Appears to be in SR.  Sees Dr Ubaldo Glassing.    2. Essential hypertension Blood pressure doing well.  Same  medication regimen.   - Basic metabolic panel; Future  3. Type II diabetes mellitus with nephropathy Sugars have been doing better.  She is monitoring her diet and exercising.  Up to date with eye checks.   - Hemoglobin A1c; Future Lab Results  Component Value Date   HGBA1C 6.5 10/04/2013   4. CKD (chronic kidney disease) stage 4, GFR 15-29 ml/min Cr has been stable around 2.4-2.5.  Seeing nephrology.   - Basic metabolic panel; Future  5. Hypercholesterolemia Low cholesterol diet and exercise.  On pravastatin.  - Lipid panel; Future - Hepatic function panel; Future Lab Results  Component Value Date   CHOL 166 10/04/2013   HDL 58.60 10/04/2013   LDLCALC 89 10/04/2013   TRIG 91.0 10/04/2013   CHOLHDL 3 10/04/2013   6. History of colonic polyps Colonoscopy as outlined - last 10/31/12.    7. Anemia, unspecified anemia type Last hgb wnl.  Follow.   - CBC with Differential; Future  8. HYPONATREMIA.  Follow sodium.    HEALTH MAINTENANCE.  Physical 04/17/13.  She is s/p hysterectomy.  Colonoscopy 07/16/06 revealed sessile polyp that was removed.  Recommended repeat in five years.  Had follow up colonoscopy that revealed multiple polyps.  Last follow up colonoscopy 10/31/12.  See report.  Recommended f/u colonoscopy in three years.  Mammogram 05/08/13 recently recommended f/u views.  F/u right breast mammogram (05/17/13) - BiRADS I.

## 2014-01-24 NOTE — Progress Notes (Signed)
Pre visit review using our clinic review tool, if applicable. No additional management support is needed unless otherwise documented below in the visit note. 

## 2014-01-28 ENCOUNTER — Encounter: Payer: Self-pay | Admitting: Internal Medicine

## 2014-02-01 ENCOUNTER — Other Ambulatory Visit: Payer: Self-pay | Admitting: Internal Medicine

## 2014-02-28 ENCOUNTER — Other Ambulatory Visit: Payer: Self-pay | Admitting: *Deleted

## 2014-02-28 DIAGNOSIS — I10 Essential (primary) hypertension: Secondary | ICD-10-CM | POA: Diagnosis not present

## 2014-02-28 DIAGNOSIS — E119 Type 2 diabetes mellitus without complications: Secondary | ICD-10-CM | POA: Diagnosis not present

## 2014-02-28 DIAGNOSIS — I4891 Unspecified atrial fibrillation: Secondary | ICD-10-CM | POA: Diagnosis not present

## 2014-02-28 DIAGNOSIS — I480 Paroxysmal atrial fibrillation: Secondary | ICD-10-CM | POA: Diagnosis not present

## 2014-02-28 MED ORDER — DILTIAZEM HCL ER BEADS 360 MG PO CP24: ORAL_CAPSULE | ORAL | Status: DC

## 2014-03-07 DIAGNOSIS — H3531 Nonexudative age-related macular degeneration: Secondary | ICD-10-CM | POA: Diagnosis not present

## 2014-03-07 DIAGNOSIS — H3532 Exudative age-related macular degeneration: Secondary | ICD-10-CM | POA: Diagnosis not present

## 2014-03-09 ENCOUNTER — Other Ambulatory Visit: Payer: Self-pay | Admitting: Internal Medicine

## 2014-03-24 ENCOUNTER — Other Ambulatory Visit: Payer: Self-pay | Admitting: Internal Medicine

## 2014-03-28 DIAGNOSIS — I4891 Unspecified atrial fibrillation: Secondary | ICD-10-CM | POA: Diagnosis not present

## 2014-04-02 ENCOUNTER — Other Ambulatory Visit (INDEPENDENT_AMBULATORY_CARE_PROVIDER_SITE_OTHER): Payer: Medicare Other

## 2014-04-02 DIAGNOSIS — I10 Essential (primary) hypertension: Secondary | ICD-10-CM

## 2014-04-02 DIAGNOSIS — E7800 Pure hypercholesterolemia, unspecified: Secondary | ICD-10-CM

## 2014-04-02 DIAGNOSIS — E1121 Type 2 diabetes mellitus with diabetic nephropathy: Secondary | ICD-10-CM | POA: Diagnosis not present

## 2014-04-02 DIAGNOSIS — D649 Anemia, unspecified: Secondary | ICD-10-CM

## 2014-04-02 DIAGNOSIS — N184 Chronic kidney disease, stage 4 (severe): Secondary | ICD-10-CM | POA: Diagnosis not present

## 2014-04-02 LAB — CBC WITH DIFFERENTIAL/PLATELET
BASOS ABS: 0 10*3/uL (ref 0.0–0.1)
Basophils Relative: 0.3 % (ref 0.0–3.0)
Eosinophils Absolute: 0.1 10*3/uL (ref 0.0–0.7)
Eosinophils Relative: 1.1 % (ref 0.0–5.0)
HCT: 35.5 % — ABNORMAL LOW (ref 36.0–46.0)
HEMOGLOBIN: 12.1 g/dL (ref 12.0–15.0)
LYMPHS PCT: 14.1 % (ref 12.0–46.0)
Lymphs Abs: 0.9 10*3/uL (ref 0.7–4.0)
MCHC: 34 g/dL (ref 30.0–36.0)
MCV: 86.4 fl (ref 78.0–100.0)
MONOS PCT: 12.6 % — AB (ref 3.0–12.0)
Monocytes Absolute: 0.8 10*3/uL (ref 0.1–1.0)
NEUTROS ABS: 4.4 10*3/uL (ref 1.4–7.7)
NEUTROS PCT: 71.9 % (ref 43.0–77.0)
Platelets: 229 10*3/uL (ref 150.0–400.0)
RBC: 4.11 Mil/uL (ref 3.87–5.11)
RDW: 14.5 % (ref 11.5–15.5)
WBC: 6.2 10*3/uL (ref 4.0–10.5)

## 2014-04-02 LAB — LIPID PANEL
Cholesterol: 149 mg/dL (ref 0–200)
HDL: 61.7 mg/dL (ref >39.00)
LDL Cholesterol: 75 mg/dL (ref 0–99)
NonHDL: 87.3
TRIGLYCERIDES: 60 mg/dL (ref 0.0–149.0)
Total CHOL/HDL Ratio: 2
VLDL: 12 mg/dL (ref 0.0–40.0)

## 2014-04-02 LAB — HEPATIC FUNCTION PANEL
ALT: 19 U/L (ref 0–35)
AST: 26 U/L (ref 0–37)
Albumin: 3.7 g/dL (ref 3.5–5.2)
Alkaline Phosphatase: 55 U/L (ref 39–117)
BILIRUBIN TOTAL: 0.4 mg/dL (ref 0.2–1.2)
Bilirubin, Direct: 0.1 mg/dL (ref 0.0–0.3)
Total Protein: 7.5 g/dL (ref 6.0–8.3)

## 2014-04-02 LAB — BASIC METABOLIC PANEL
BUN: 60 mg/dL — ABNORMAL HIGH (ref 6–23)
CALCIUM: 10.2 mg/dL (ref 8.4–10.5)
CO2: 28 mEq/L (ref 19–32)
Chloride: 105 mEq/L (ref 96–112)
Creatinine, Ser: 2.9 mg/dL — ABNORMAL HIGH (ref 0.40–1.20)
GFR: 20.06 mL/min — ABNORMAL LOW (ref >60.00)
Glucose, Bld: 126 mg/dL — ABNORMAL HIGH (ref 70–99)
Potassium: 4.5 mEq/L (ref 3.5–5.1)
SODIUM: 136 meq/L (ref 135–145)

## 2014-04-02 LAB — HEMOGLOBIN A1C: HEMOGLOBIN A1C: 6.6 % — AB (ref 4.6–6.5)

## 2014-04-04 ENCOUNTER — Ambulatory Visit (INDEPENDENT_AMBULATORY_CARE_PROVIDER_SITE_OTHER): Payer: Medicare Other | Admitting: Internal Medicine

## 2014-04-04 ENCOUNTER — Encounter: Payer: Self-pay | Admitting: Internal Medicine

## 2014-04-04 VITALS — BP 160/80 | HR 91 | Temp 98.4°F | Ht 60.0 in | Wt 142.2 lb

## 2014-04-04 DIAGNOSIS — J4520 Mild intermittent asthma, uncomplicated: Secondary | ICD-10-CM

## 2014-04-04 DIAGNOSIS — E1121 Type 2 diabetes mellitus with diabetic nephropathy: Secondary | ICD-10-CM

## 2014-04-04 DIAGNOSIS — D751 Secondary polycythemia: Secondary | ICD-10-CM

## 2014-04-04 DIAGNOSIS — E010 Iodine-deficiency related diffuse (endemic) goiter: Secondary | ICD-10-CM

## 2014-04-04 DIAGNOSIS — E7800 Pure hypercholesterolemia, unspecified: Secondary | ICD-10-CM

## 2014-04-04 DIAGNOSIS — N184 Chronic kidney disease, stage 4 (severe): Secondary | ICD-10-CM | POA: Diagnosis not present

## 2014-04-04 DIAGNOSIS — Z86010 Personal history of colonic polyps: Secondary | ICD-10-CM

## 2014-04-04 DIAGNOSIS — E049 Nontoxic goiter, unspecified: Secondary | ICD-10-CM

## 2014-04-04 DIAGNOSIS — Z0000 Encounter for general adult medical examination without abnormal findings: Secondary | ICD-10-CM

## 2014-04-04 DIAGNOSIS — I10 Essential (primary) hypertension: Secondary | ICD-10-CM | POA: Diagnosis not present

## 2014-04-04 DIAGNOSIS — I4891 Unspecified atrial fibrillation: Secondary | ICD-10-CM

## 2014-04-04 DIAGNOSIS — D649 Anemia, unspecified: Secondary | ICD-10-CM

## 2014-04-04 NOTE — Progress Notes (Signed)
Patient ID: Joan Erickson, female   DOB: 1932-01-16, 81 y.o.   MRN: [id number]   Subjective:    Patient ID: Joan Erickson, female    DOB: Mar 25, 1931, 81 y.o.   MRN: [id number]  HPI  Patient here for her physical exam.  States she is doing well.  Feels good.  Exercising.  No cardiac symptoms with increased activity or exertion.  Breathing stable.  Blood sugar averaging 130-150 in the am.  Is followed regularly at [facility] for [facility] exams.  Bowels stable.  Feels good.     Past Medical History  Diagnosis Date  . Hypertension   . Asthma   . GERD (gastroesophageal reflux disease)   . Hypercholesterolemia   . History of colon polyps   . Xanthogranulomatous pyelonephritis     s/p right nephrectomy  . Hypogammaglobulinemia     IgA  . Atrial fibrillation     Current Outpatient Prescriptions on File Prior to Visit  Medication Sig Dispense Refill  . ADVAIR DISKUS 250-50 MCG/DOSE AEPB INHALE 1 PUFF TWICE A DAY 60 each 5  . Blood Glucose Monitoring Suppl (ONE TOUCH ULTRA SYSTEM KIT) W/DEVICE KIT 1 kit by Does not apply route once. 1 each 0  . chlorthalidone (HYGROTON) 25 MG tablet TAKE 1 TABLET (25 MG TOTAL) BY MOUTH DAILY. 30 tablet 5  . Cholecalciferol (VITAMIN D) 2000 UNITS tablet Take 2,000 Units by mouth daily.    Marland Kitchen diltiazem (TIAZAC) 360 MG 24 hr capsule TAKE 1 CAPSULE (360 MG TOTAL) BY MOUTH DAILY. 30 capsule 5  . fluticasone (FLONASE) 50 MCG/ACT nasal spray USE 2 SPRAYS IN EACH NOSTRIL ONCE A DAY 16 g 5  . glucose blood test strip One Touch Ultra test strip. Check sugar bid. Dx 250.40 100 each 5  . losartan (COZAAR) 100 MG tablet TAKE 1 TABLET BY MOUTH ONCE A DAY 30 tablet 5  . montelukast (SINGULAIR) 10 MG tablet TAKE 1 TABLET EVERY DAY 30 tablet 5  . pravastatin (PRAVACHOL) 20 MG tablet TAKE 1 TABLET (20 MG TOTAL) BY MOUTH DAILY. 30 tablet 5  . PROAIR HFA 108 (90 BASE) MCG/ACT inhaler INHALE 2 PUFF EVERY SIX HOURS AS NEEDED 8.5 Inhaler 2  . TRADJENTA 5 MG TABS tablet TAKE 1 TABLET (5 MG  TOTAL) BY MOUTH DAILY. 30 tablet 11  . warfarin (COUMADIN) 5 MG tablet As directed     No current facility-administered medications on file prior to visit.    Review of Systems  Constitutional: Negative for appetite change and unexpected weight change.  HENT: Negative for congestion and sinus pressure.   Eyes: Negative for pain and visual disturbance.  Respiratory: Negative for cough, chest tightness and shortness of breath.   Cardiovascular: Negative for chest pain, palpitations and leg swelling.  Gastrointestinal: Negative for nausea, vomiting, abdominal pain and diarrhea.  Genitourinary: Negative for dysuria and difficulty urinating.  Musculoskeletal: Negative for back pain and joint swelling.  Skin: Negative for color change and rash.  Neurological: Negative for dizziness and headaches.  Hematological: Negative for adenopathy. Does not bruise/bleed easily.  Psychiatric/Behavioral: Negative for dysphoric mood and agitation.       Objective:    Physical Exam  Constitutional: She is oriented to person, place, and time. She appears well-developed and well-nourished.  HENT:  Nose: Nose normal.  Mouth/Throat: Oropharynx is clear and moist.  Eyes: Right eye exhibits no discharge. Left eye exhibits no discharge. No scleral icterus.  Neck: Neck supple. No thyromegaly present.  Cardiovascular: Normal rate and  regular rhythm.   Pulmonary/Chest: Breath sounds normal. No accessory muscle usage. No tachypnea. No respiratory distress. She has no decreased breath sounds. She has no wheezes. She has no rhonchi. Right breast exhibits no inverted nipple, no mass, no nipple discharge and no tenderness (no axillary adenopathy). Left breast exhibits no inverted nipple, no mass, no nipple discharge and no tenderness (no axilarry adenopathy).  Abdominal: Soft. Bowel sounds are normal. There is no tenderness.  Musculoskeletal: She exhibits no edema or tenderness.  Lymphadenopathy:    She has no  cervical adenopathy.  Neurological: She is alert and oriented to person, place, and time.  Skin: Skin is warm. No rash noted.  Psychiatric: She has a normal mood and affect. Her behavior is normal.    BP 160/80 mmHg  Pulse 91  Temp(Src) 98.4 F (36.9 C) (Oral)  Ht 5' (1.524 m)  Wt 142 lb 4 oz (64.524 kg)  BMI 27.78 kg/m2  SpO2 94% Wt Readings from Last 3 Encounters:  04/04/14 142 lb 4 oz (64.524 kg)  01/24/14 140 lb 4 oz (63.617 kg)  09/22/13 141 lb 8 oz (64.184 kg)     Lab Results  Component Value Date   WBC 6.2 04/02/2014   HGB 12.1 04/02/2014   HCT 35.5* 04/02/2014   PLT 229.0 04/02/2014   GLUCOSE 126* 04/02/2014   CHOL 149 04/02/2014   TRIG 60.0 04/02/2014   HDL 61.70 04/02/2014   LDLCALC 75 04/02/2014   ALT 19 04/02/2014   AST 26 04/02/2014   NA 136 04/02/2014   K 4.5 04/02/2014   CL 105 04/02/2014   CREATININE 2.90* 04/02/2014   BUN 60* 04/02/2014   CO2 28 04/02/2014   TSH 2.59 05/30/2013   HGBA1C 6.6* 04/02/2014       Assessment & Plan:   Problem List Items Addressed This Visit    Anemia    Follow cbc.  hgb just checked 12.1.        Asthma    Breathing doing well.  Follow.        Atrial fibrillation    Appear to be in SR.  Sees Dr Ubaldo Glassing.  Exercising.  Asymptomatic.        CKD (chronic kidney disease) stage 4, GFR 15-29 ml/min - Primary    Cr on last check 2.9.  Labs forwarded to Dr Suezanne Cheshire.  Recheck met b in a few weeks to confirm stable.       Relevant Orders   Basic metabolic panel   Health care maintenance    Physical 04/04/14.  Colonoscopy 10/31/2012 - recommended f/u in three years.  Mammogram 05/08/13 recommended f/u views.  F/u views 05/17/13  - Birads I.        History of colonic polyps    Last colonoscopy 10/31/12.  Recommended f/u colonoscopy in three years.        Hypercholesterolemia    Low cholesterol diet and exercise.  On pravastatin.  Follow lipid panel and liver function tests.       Hypertension    Blood pressure on  recheck improved - 148/78.  Same medication regimen.  Follow pressures.  Follow metabolic panel.       Polycythemia    Was followed by hematology.  Follow cbc.       Thyromegaly    Sees Dr Eddie Dibbles.  Biopsy negative.  Follow.       Type II diabetes mellitus with nephropathy    Sugars as outlined.  Follow met b and a1c.  a1c just checked - 6.6.  Up to date with eye checks.          I spent 25 minutes with the patient and more than 50% of the time was spent in consultation regarding the above.     Einar Pheasant, MD

## 2014-04-04 NOTE — Progress Notes (Signed)
Pre visit review using our clinic review tool, if applicable. No additional management support is needed unless otherwise documented below in the visit note. 

## 2014-04-08 ENCOUNTER — Encounter: Payer: Self-pay | Admitting: Internal Medicine

## 2014-04-08 DIAGNOSIS — Z0000 Encounter for general adult medical examination without abnormal findings: Secondary | ICD-10-CM | POA: Insufficient documentation

## 2014-04-08 NOTE — Assessment & Plan Note (Signed)
Sees Dr Eddie Dibbles.  Biopsy negative.  Follow.

## 2014-04-08 NOTE — Assessment & Plan Note (Signed)
Was followed by hematology.  Follow cbc.

## 2014-04-08 NOTE — Assessment & Plan Note (Signed)
Cr on last check 2.9.  Labs forwarded to Dr Motl.  Recheck met b in a few weeks to confirm stable.  

## 2014-04-08 NOTE — Assessment & Plan Note (Signed)
Physical 04/04/14.  Colonoscopy 10/31/2012 - recommended f/u in three years.  Mammogram 05/08/13 recommended f/u views.  F/u views 05/17/13  - [facility] I.

## 2014-04-08 NOTE — Assessment & Plan Note (Signed)
Blood pressure on recheck improved - 148/78.  Same medication regimen.  Follow pressures.  Follow metabolic panel.

## 2014-04-08 NOTE — Assessment & Plan Note (Signed)
Last colonoscopy 10/31/12.  Recommended f/u colonoscopy in three years.

## 2014-04-08 NOTE — Assessment & Plan Note (Signed)
Sugars as outlined.  Follow met b and a1c.  a1c just checked - 6.6.  Up to date with eye checks.

## 2014-04-08 NOTE — Assessment & Plan Note (Signed)
Low cholesterol diet and exercise.  On pravastatin.  Follow lipid panel and liver function tests.   

## 2014-04-08 NOTE — Assessment & Plan Note (Signed)
Breathing doing well.  Follow.

## 2014-04-08 NOTE — Assessment & Plan Note (Signed)
Appear to be in [place].  Sees Dr Ubaldo Glassing.  Exercising.  Asymptomatic.

## 2014-04-08 NOTE — Assessment & Plan Note (Signed)
Follow cbc.  hgb just checked 12.1.

## 2014-04-11 DIAGNOSIS — I4891 Unspecified atrial fibrillation: Secondary | ICD-10-CM | POA: Diagnosis not present

## 2014-04-18 ENCOUNTER — Other Ambulatory Visit (INDEPENDENT_AMBULATORY_CARE_PROVIDER_SITE_OTHER): Payer: Medicare Other

## 2014-04-18 DIAGNOSIS — N184 Chronic kidney disease, stage 4 (severe): Secondary | ICD-10-CM | POA: Diagnosis not present

## 2014-04-18 LAB — BASIC METABOLIC PANEL
BUN: 60 mg/dL — ABNORMAL HIGH (ref 6–23)
CHLORIDE: 104 meq/L (ref 96–112)
CO2: 27 mEq/L (ref 19–32)
CREATININE: 2.55 mg/dL — AB (ref 0.40–1.20)
Calcium: 10.4 mg/dL (ref 8.4–10.5)
GFR: 23.27 mL/min — ABNORMAL LOW (ref >60.00)
Glucose, Bld: 106 mg/dL — ABNORMAL HIGH (ref 70–99)
Potassium: 4.8 mEq/L (ref 3.5–5.1)
Sodium: 134 mEq/L — ABNORMAL LOW (ref 135–145)

## 2014-04-19 ENCOUNTER — Encounter: Payer: Self-pay | Admitting: *Deleted

## 2014-04-27 ENCOUNTER — Other Ambulatory Visit: Payer: Self-pay | Admitting: Internal Medicine

## 2014-04-29 ENCOUNTER — Other Ambulatory Visit: Payer: Self-pay | Admitting: Internal Medicine

## 2014-05-02 DIAGNOSIS — H2513 Age-related nuclear cataract, bilateral: Secondary | ICD-10-CM | POA: Diagnosis not present

## 2014-05-02 DIAGNOSIS — H547 Unspecified visual loss: Secondary | ICD-10-CM | POA: Diagnosis not present

## 2014-05-02 DIAGNOSIS — H43813 Vitreous degeneration, bilateral: Secondary | ICD-10-CM | POA: Diagnosis not present

## 2014-05-02 DIAGNOSIS — H3532 Exudative age-related macular degeneration: Secondary | ICD-10-CM | POA: Diagnosis not present

## 2014-05-02 DIAGNOSIS — H3531 Nonexudative age-related macular degeneration: Secondary | ICD-10-CM | POA: Diagnosis not present

## 2014-05-09 DIAGNOSIS — I4891 Unspecified atrial fibrillation: Secondary | ICD-10-CM | POA: Diagnosis not present

## 2014-05-16 ENCOUNTER — Other Ambulatory Visit: Payer: Self-pay | Admitting: Internal Medicine

## 2014-06-07 DIAGNOSIS — I480 Paroxysmal atrial fibrillation: Secondary | ICD-10-CM | POA: Diagnosis not present

## 2014-06-20 DIAGNOSIS — H3532 Exudative age-related macular degeneration: Secondary | ICD-10-CM | POA: Diagnosis not present

## 2014-06-20 DIAGNOSIS — H547 Unspecified visual loss: Secondary | ICD-10-CM | POA: Diagnosis not present

## 2014-06-22 ENCOUNTER — Telehealth: Payer: Self-pay

## 2014-06-22 NOTE — Telephone Encounter (Signed)
Ok if only one month delay if she is doing ok.  If needs to be seen earlier, I can work her in.

## 2014-06-22 NOTE — Telephone Encounter (Signed)
Called pt and relayed msg

## 2014-06-22 NOTE — Telephone Encounter (Signed)
The patient called and had to move her apt to the end of August.  She stated she did not need any medications refilled, but wanted to double check and make sure it was okay to move her 4 month follow up to this date.  This would move her 4 month follow up to a 5 month follow up.

## 2014-06-25 DIAGNOSIS — I10 Essential (primary) hypertension: Secondary | ICD-10-CM | POA: Diagnosis not present

## 2014-06-25 DIAGNOSIS — N184 Chronic kidney disease, stage 4 (severe): Secondary | ICD-10-CM | POA: Diagnosis not present

## 2014-06-25 DIAGNOSIS — I480 Paroxysmal atrial fibrillation: Secondary | ICD-10-CM | POA: Diagnosis not present

## 2014-06-25 DIAGNOSIS — E1122 Type 2 diabetes mellitus with diabetic chronic kidney disease: Secondary | ICD-10-CM | POA: Diagnosis not present

## 2014-06-25 DIAGNOSIS — N186 End stage renal disease: Secondary | ICD-10-CM | POA: Diagnosis not present

## 2014-06-25 DIAGNOSIS — R809 Proteinuria, unspecified: Secondary | ICD-10-CM | POA: Diagnosis not present

## 2014-06-27 DIAGNOSIS — E559 Vitamin D deficiency, unspecified: Secondary | ICD-10-CM | POA: Diagnosis not present

## 2014-06-27 DIAGNOSIS — N184 Chronic kidney disease, stage 4 (severe): Secondary | ICD-10-CM | POA: Diagnosis not present

## 2014-07-04 ENCOUNTER — Other Ambulatory Visit: Payer: Self-pay | Admitting: Internal Medicine

## 2014-07-06 ENCOUNTER — Other Ambulatory Visit: Payer: Self-pay | Admitting: Internal Medicine

## 2014-07-06 ENCOUNTER — Telehealth: Payer: Self-pay

## 2014-07-06 DIAGNOSIS — Z1231 Encounter for screening mammogram for malignant neoplasm of breast: Secondary | ICD-10-CM

## 2014-07-06 DIAGNOSIS — Z1239 Encounter for other screening for malignant neoplasm of breast: Secondary | ICD-10-CM

## 2014-07-06 NOTE — Telephone Encounter (Signed)
The pt called and has her mammogram scheduled for Monday.  She stated waited, and did not get a call from the referral coordinator, so she made her own appointment.  She wanted to make sure this was okay with Dr.Scott and there is nothing more she needs to do to have this appointment on Monday.

## 2014-07-06 NOTE — Telephone Encounter (Signed)
Orders are in. Called and left VM, notifying nothing further needed at this time.

## 2014-07-09 ENCOUNTER — Other Ambulatory Visit: Payer: Self-pay | Admitting: Internal Medicine

## 2014-07-09 ENCOUNTER — Ambulatory Visit
Admission: RE | Admit: 2014-07-09 | Discharge: 2014-07-09 | Disposition: A | Discharge status: Home / Self Care | Payer: Medicare Other | Source: Ambulatory Visit | Attending: Internal Medicine | Admitting: Internal Medicine

## 2014-07-09 DIAGNOSIS — Z1231 Encounter for screening mammogram for malignant neoplasm of breast: Secondary | ICD-10-CM | POA: Insufficient documentation

## 2014-07-15 ENCOUNTER — Other Ambulatory Visit: Payer: Self-pay | Admitting: Internal Medicine

## 2014-07-24 DIAGNOSIS — I480 Paroxysmal atrial fibrillation: Secondary | ICD-10-CM | POA: Diagnosis not present

## 2014-08-03 ENCOUNTER — Other Ambulatory Visit: Payer: Self-pay | Admitting: Internal Medicine

## 2014-08-08 ENCOUNTER — Ambulatory Visit: Payer: Medicare Other | Admitting: Internal Medicine

## 2014-08-14 ENCOUNTER — Other Ambulatory Visit: Payer: Self-pay | Admitting: Internal Medicine

## 2014-08-16 ENCOUNTER — Other Ambulatory Visit: Payer: Self-pay | Admitting: Internal Medicine

## 2014-08-21 DIAGNOSIS — I480 Paroxysmal atrial fibrillation: Secondary | ICD-10-CM | POA: Diagnosis not present

## 2014-08-22 DIAGNOSIS — H3532 Exudative age-related macular degeneration: Secondary | ICD-10-CM | POA: Diagnosis not present

## 2014-08-22 DIAGNOSIS — H3531 Nonexudative age-related macular degeneration: Secondary | ICD-10-CM | POA: Diagnosis not present

## 2014-09-09 ENCOUNTER — Other Ambulatory Visit: Payer: Self-pay | Admitting: Internal Medicine

## 2014-09-18 DIAGNOSIS — I480 Paroxysmal atrial fibrillation: Secondary | ICD-10-CM | POA: Diagnosis not present

## 2014-09-19 ENCOUNTER — Encounter: Payer: Self-pay | Admitting: Internal Medicine

## 2014-09-19 ENCOUNTER — Ambulatory Visit (INDEPENDENT_AMBULATORY_CARE_PROVIDER_SITE_OTHER): Payer: Medicare Other | Admitting: Internal Medicine

## 2014-09-19 ENCOUNTER — Encounter (INDEPENDENT_AMBULATORY_CARE_PROVIDER_SITE_OTHER): Payer: Self-pay

## 2014-09-19 VITALS — BP 130/80 | HR 103 | Temp 98.8°F | Ht 60.0 in | Wt 142.4 lb

## 2014-09-19 DIAGNOSIS — D649 Anemia, unspecified: Secondary | ICD-10-CM

## 2014-09-19 DIAGNOSIS — E1121 Type 2 diabetes mellitus with diabetic nephropathy: Secondary | ICD-10-CM

## 2014-09-19 DIAGNOSIS — J4520 Mild intermittent asthma, uncomplicated: Secondary | ICD-10-CM | POA: Diagnosis not present

## 2014-09-19 DIAGNOSIS — I10 Essential (primary) hypertension: Secondary | ICD-10-CM

## 2014-09-19 DIAGNOSIS — E7800 Pure hypercholesterolemia, unspecified: Secondary | ICD-10-CM

## 2014-09-19 DIAGNOSIS — Z23 Encounter for immunization: Secondary | ICD-10-CM | POA: Diagnosis not present

## 2014-09-19 DIAGNOSIS — N184 Chronic kidney disease, stage 4 (severe): Secondary | ICD-10-CM

## 2014-09-19 DIAGNOSIS — I4891 Unspecified atrial fibrillation: Secondary | ICD-10-CM | POA: Diagnosis not present

## 2014-09-19 DIAGNOSIS — I480 Paroxysmal atrial fibrillation: Secondary | ICD-10-CM | POA: Diagnosis not present

## 2014-09-19 NOTE — Progress Notes (Signed)
Pre-visit discussion using our clinic review tool. No additional management support is needed unless otherwise documented below in the visit note.  

## 2014-09-19 NOTE — Progress Notes (Signed)
Patient ID: AVABELLA WAILES, female   DOB: December 04, 1931, 81 y.o.   MRN: [id number]   Subjective:    Patient ID: [name], female    DOB: [birth year], 81 y.o.   MRN: [id number]  HPI  Patient here for a scheduled follow up.  She is exercising.  No chest pain or tightness.  No increased heart rate or palpitations.  No sob.  Breathing stable.  No acid reflux.  No abdominal pain or cramping.  Sugars doing well.  See attached list.  Watching her diet.  Bowels stable.  Handling stress well.     Past Medical History  Diagnosis Date  . Hypertension   . Asthma   . GERD (gastroesophageal reflux disease)   . Hypercholesterolemia   . History of colon polyps   . Xanthogranulomatous pyelonephritis     s/p right nephrectomy  . Hypogammaglobulinemia     IgA  . Atrial fibrillation    Past Surgical History  Procedure Laterality Date  . Abdominal hysterectomy      ovaries not removed  . Cesarean section    . Nephrectomy      right - for xanthogranulomatous pyelonephritis   Family History  Problem Relation Age of Onset  . Brain cancer Mother   . Liver cancer Father     questionabl colon cancer  . Breast cancer Neg Hx    Social History   Social History  . Marital Status: Married    Spouse Name: N/A  . Number of Children: 1  . Years of Education: N/A   Social History Main Topics  . Smoking status: Never Smoker   . Smokeless tobacco: Never Used  . Alcohol Use: No  . Drug Use: No  . Sexual Activity: Not Asked   Other Topics Concern  . None   Social History Narrative    Outpatient Encounter Prescriptions as of 09/19/2014  Medication Sig  . ADVAIR DISKUS 250-50 MCG/DOSE AEPB INHALE 1 PUFF BY MOUTH TWICE A DAY  . Blood Glucose Monitoring Suppl (ONE TOUCH ULTRA SYSTEM KIT) W/DEVICE KIT 1 kit by Does not apply route once.  . chlorthalidone (HYGROTON) 25 MG tablet TAKE 1 TABLET (25 MG TOTAL) BY MOUTH DAILY.  Marland Kitchen Cholecalciferol (VITAMIN D) 2000 UNITS tablet Take 2,000 Units by mouth  daily.  Marland Kitchen diltiazem (TIAZAC) 360 MG 24 hr capsule TAKE ONE CAPSULE BY MOUTH DAILY  . fluticasone (FLONASE) 50 MCG/ACT nasal spray USE 2 SPRAYS IN EACH NOSTRIL ONCE A DAY  . glucose blood (ONE TOUCH ULTRA TEST) test strip Check Blood sugar 2 times a day DX:  E11.9  . losartan (COZAAR) 100 MG tablet TAKE 1 TABLET BY MOUTH ONCE A DAY  . montelukast (SINGULAIR) 10 MG tablet TAKE 1 TABLET EVERY DAY  . pravastatin (PRAVACHOL) 20 MG tablet TAKE 1 TABLET (20 MG TOTAL) BY MOUTH DAILY.  Marland Kitchen PROAIR HFA 108 (90 BASE) MCG/ACT inhaler INHALE 2 PUFF EVERY SIX HOURS AS NEEDED  . TRADJENTA 5 MG TABS tablet TAKE 1 TABLET (5 MG TOTAL) BY MOUTH DAILY.  Marland Kitchen warfarin (COUMADIN) 5 MG tablet As directed  . [DISCONTINUED] chlorthalidone (HYGROTON) 25 MG tablet TAKE 1 TABLET (25 MG TOTAL) BY MOUTH DAILY.   No facility-administered encounter medications on file as of 09/19/2014.    Review of Systems  Constitutional: Negative for appetite change.       Has adjusted her diet.  Is exercising.    HENT: Negative for congestion and sinus pressure.   Respiratory: Negative for cough,  chest tightness and shortness of breath.   Cardiovascular: Negative for chest pain, palpitations and leg swelling.  Gastrointestinal: Negative for nausea, vomiting, abdominal pain and diarrhea.  Genitourinary: Negative for dysuria and difficulty urinating.  Musculoskeletal: Negative for back pain and joint swelling.  Skin: Negative for color change and rash.  Neurological: Negative for dizziness, light-headedness and headaches.  Psychiatric/Behavioral: Negative for dysphoric mood and agitation.       Objective:    Physical Exam  Constitutional: She appears well-developed and well-nourished. No distress.  HENT:  Nose: Nose normal.  Mouth/Throat: Oropharynx is clear and moist.  Eyes: Conjunctivae are normal. Right eye exhibits no discharge. Left eye exhibits no discharge.  Neck: Neck supple. No thyromegaly present.  Cardiovascular: Normal  rate and regular rhythm.   Pulmonary/Chest: Breath sounds normal. No respiratory distress. She has no wheezes.  Abdominal: Soft. Bowel sounds are normal. There is no tenderness.  Musculoskeletal: She exhibits no edema or tenderness.  Lymphadenopathy:    She has no cervical adenopathy.  Skin: No rash noted. No erythema.  Psychiatric: She has a normal mood and affect. Her behavior is normal.    BP 130/80 mmHg  Pulse 103  Temp(Src) 98.8 F (37.1 C) (Oral)  Ht 5' (1.524 m)  Wt 142 lb 6 oz (64.581 kg)  BMI 27.81 kg/m2  SpO2 94% Wt Readings from Last 3 Encounters:  09/19/14 142 lb 6 oz (64.581 kg)  04/04/14 142 lb 4 oz (64.524 kg)  01/24/14 140 lb 4 oz (63.617 kg)     Lab Results  Component Value Date   WBC 6.2 04/02/2014   HGB 12.1 04/02/2014   HCT 35.5* 04/02/2014   PLT 229.0 04/02/2014   GLUCOSE 106* 04/18/2014   CHOL 149 04/02/2014   TRIG 60.0 04/02/2014   HDL 61.70 04/02/2014   LDLCALC 75 04/02/2014   ALT 19 04/02/2014   AST 26 04/02/2014   NA 134* 04/18/2014   K 4.8 04/18/2014   CL 104 04/18/2014   CREATININE 2.55* 04/18/2014   BUN 60* 04/18/2014   CO2 27 04/18/2014   TSH 2.59 05/30/2013   HGBA1C 6.6* 04/02/2014    Mm Screening Breast Tomo Bilateral  07/09/2014   CLINICAL DATA:  Screening.  EXAM: DIGITAL SCREENING BILATERAL MAMMOGRAM WITH 3D TOMO WITH CAD  COMPARISON:  Previous exam(s).  ACR Breast Density Category c: The breast tissue is heterogeneously dense, which may obscure small masses.  FINDINGS: There are no findings suspicious for malignancy. Images were processed with CAD.  IMPRESSION: No mammographic evidence of malignancy. A result letter of this screening mammogram will be mailed directly to the patient.  RECOMMENDATION: Screening mammogram in one year. (Code:SM-B-01Y)  BI-RADS CATEGORY  1: Negative.   Electronically Signed   By: Margarette Canada M.D.   On: 07/09/2014 13:23       Assessment & Plan:   Problem List Items Addressed This Visit    Anemia     hgb 04/02/14 - 12.1.  Follow.       Relevant Orders   CBC with Differential/Platelet   Asthma    Breathing doing well.        Atrial fibrillation    Followed by Dr Ubaldo Glassing.  They follow coumadin.        CKD (chronic kidney disease) stage 4, GFR 15-29 ml/min    Followed by nephrology.        Hypercholesterolemia    On pravastatin.  Low cholesterol diet and exercise.  Follow lipid panel and liver function  tests.        Relevant Orders   Lipid panel   Hepatic function panel   Hypertension    Blood pressure under good control.  Continue same medication regimen.  Follow pressures.  Follow metabolic panel.        Relevant Orders   Basic metabolic panel   Type II diabetes mellitus with nephropathy    Sugars have been doing well.  Same medication regimen.  Follow sugars.  Follow metabolic panel.   Lab Results  Component Value Date   HGBA1C 6.6* 04/02/2014        Relevant Orders   Hemoglobin A1c    Other Visit Diagnoses    Encounter for immunization    -  Primary        Einar Pheasant, MD

## 2014-09-25 ENCOUNTER — Encounter: Payer: Self-pay | Admitting: Internal Medicine

## 2014-09-25 NOTE — Assessment & Plan Note (Signed)
On pravastatin.  Low cholesterol diet and exercise.  Follow lipid panel and liver function tests.   

## 2014-09-25 NOTE — Assessment & Plan Note (Signed)
Followed by nephrology. 

## 2014-09-25 NOTE — Assessment & Plan Note (Signed)
Blood pressure under good control.  Continue same medication regimen.  Follow pressures.  Follow metabolic panel.   

## 2014-09-25 NOTE — Assessment & Plan Note (Signed)
hgb 04/02/14 - 12.1.  Follow.

## 2014-09-25 NOTE — Assessment & Plan Note (Signed)
Followed by Dr Ubaldo Glassing.  They follow coumadin.

## 2014-09-25 NOTE — Assessment & Plan Note (Signed)
Breathing doing well.   

## 2014-09-25 NOTE — Assessment & Plan Note (Signed)
Sugars have been doing well.  Same medication regimen.  Follow sugars.  Follow metabolic panel.   Lab Results  Component Value Date   HGBA1C 6.6* 04/02/2014

## 2014-10-02 ENCOUNTER — Other Ambulatory Visit: Payer: Medicare Other

## 2014-10-16 DIAGNOSIS — I480 Paroxysmal atrial fibrillation: Secondary | ICD-10-CM | POA: Diagnosis not present

## 2014-10-23 ENCOUNTER — Other Ambulatory Visit: Payer: Self-pay | Admitting: Internal Medicine

## 2014-10-30 ENCOUNTER — Other Ambulatory Visit: Payer: Self-pay | Admitting: Internal Medicine

## 2014-10-31 ENCOUNTER — Other Ambulatory Visit: Payer: Self-pay | Admitting: Internal Medicine

## 2014-11-02 DIAGNOSIS — E559 Vitamin D deficiency, unspecified: Secondary | ICD-10-CM | POA: Diagnosis not present

## 2014-11-02 DIAGNOSIS — N184 Chronic kidney disease, stage 4 (severe): Secondary | ICD-10-CM | POA: Diagnosis not present

## 2014-11-05 DIAGNOSIS — E871 Hypo-osmolality and hyponatremia: Secondary | ICD-10-CM | POA: Diagnosis not present

## 2014-11-06 DIAGNOSIS — E103393 Type 1 diabetes mellitus with moderate nonproliferative diabetic retinopathy without macular edema, bilateral: Secondary | ICD-10-CM | POA: Diagnosis not present

## 2014-11-13 ENCOUNTER — Telehealth: Payer: Self-pay | Admitting: Internal Medicine

## 2014-11-13 DIAGNOSIS — E871 Hypo-osmolality and hyponatremia: Secondary | ICD-10-CM | POA: Diagnosis not present

## 2014-11-13 NOTE — Telephone Encounter (Signed)
See if she can come in tomorrow at 10:00 am - block 30 minutes.  Thanks

## 2014-11-13 NOTE — Telephone Encounter (Signed)
Pt daughter Thom Chimes called about her mom regarding Extreme weight loss/no appetite for 1-2 months. Pt also has fallen. No appt avail to sch. Please let me know where to sch pt once spoken to Dr. Nicki Reaper. Darlene cell [phone]. Thank You!

## 2014-11-13 NOTE — Telephone Encounter (Signed)
Spoke with Daughter, scheduled for 10am tomorrow

## 2014-11-13 NOTE — Telephone Encounter (Signed)
Please advise 

## 2014-11-14 ENCOUNTER — Encounter: Payer: Self-pay | Admitting: Internal Medicine

## 2014-11-14 ENCOUNTER — Ambulatory Visit (INDEPENDENT_AMBULATORY_CARE_PROVIDER_SITE_OTHER): Payer: Medicare Other | Admitting: Internal Medicine

## 2014-11-14 VITALS — BP 142/70 | HR 85 | Temp 97.8°F | Resp 17 | Ht 60.0 in | Wt 116.0 lb

## 2014-11-14 DIAGNOSIS — D751 Secondary polycythemia: Secondary | ICD-10-CM

## 2014-11-14 DIAGNOSIS — I4891 Unspecified atrial fibrillation: Secondary | ICD-10-CM

## 2014-11-14 DIAGNOSIS — I10 Essential (primary) hypertension: Secondary | ICD-10-CM

## 2014-11-14 DIAGNOSIS — D649 Anemia, unspecified: Secondary | ICD-10-CM

## 2014-11-14 DIAGNOSIS — Z860100 Personal history of colon polyps, unspecified: Secondary | ICD-10-CM

## 2014-11-14 DIAGNOSIS — R634 Abnormal weight loss: Secondary | ICD-10-CM

## 2014-11-14 DIAGNOSIS — Z79899 Other long term (current) drug therapy: Secondary | ICD-10-CM | POA: Diagnosis not present

## 2014-11-14 DIAGNOSIS — E1121 Type 2 diabetes mellitus with diabetic nephropathy: Secondary | ICD-10-CM

## 2014-11-14 DIAGNOSIS — N184 Chronic kidney disease, stage 4 (severe): Secondary | ICD-10-CM

## 2014-11-14 LAB — HEPATIC FUNCTION PANEL
ALK PHOS: 50 U/L (ref 39–117)
ALT: 21 U/L (ref 0–35)
AST: 25 U/L (ref 0–37)
Albumin: 3.5 g/dL (ref 3.5–5.2)
Bilirubin, Direct: 0.1 mg/dL (ref 0.0–0.3)
Total Bilirubin: 0.4 mg/dL (ref 0.2–1.2)
Total Protein: 6.9 g/dL (ref 6.0–8.3)

## 2014-11-14 LAB — CBC WITH DIFFERENTIAL/PLATELET
BASOS ABS: 0 10*3/uL (ref 0.0–0.1)
Basophils Relative: 0.1 % (ref 0.0–3.0)
EOS PCT: 0.4 % (ref 0.0–5.0)
Eosinophils Absolute: 0 10*3/uL (ref 0.0–0.7)
HCT: 34.9 % — ABNORMAL LOW (ref 36.0–46.0)
HEMOGLOBIN: 11.5 g/dL — AB (ref 12.0–15.0)
LYMPHS ABS: 0.5 10*3/uL — AB (ref 0.7–4.0)
Lymphocytes Relative: 10.3 % — ABNORMAL LOW (ref 12.0–46.0)
MCHC: 32.9 g/dL (ref 30.0–36.0)
MCV: 85.9 fl (ref 78.0–100.0)
MONOS PCT: 12.6 % — AB (ref 3.0–12.0)
Monocytes Absolute: 0.7 10*3/uL (ref 0.1–1.0)
NEUTROS PCT: 76.6 % (ref 43.0–77.0)
Neutro Abs: 4 10*3/uL (ref 1.4–7.7)
Platelets: 266 10*3/uL (ref 150.0–400.0)
RBC: 4.06 Mil/uL (ref 3.87–5.11)
RDW: 13.7 % (ref 11.5–15.5)
WBC: 5.3 10*3/uL (ref 4.0–10.5)

## 2014-11-14 LAB — TSH: TSH: 2.03 u[IU]/mL (ref 0.35–4.50)

## 2014-11-14 LAB — VITAMIN B12: Vitamin B-12: 307 pg/mL (ref 211–911)

## 2014-11-14 NOTE — Progress Notes (Signed)
Pre-visit discussion using our clinic review tool. No additional management support is needed unless otherwise documented below in the visit note.  

## 2014-11-14 NOTE — Progress Notes (Signed)
Patient ID: Joan Erickson, female   DOB: 1931-12-04, 81 y.o.   MRN: [id number]   Subjective:    Patient ID: Joan Erickson, female    DOB: Aug 19, 1931, 81 y.o.   MRN: [id number]  HPI  Patient with past history of CKD followed by nephrology, hypertension, GERD, atrial fib and hypercholesterolemia.  She comes in today as a work in with concerns regarding weight loss and decreased appetite.  She is accompanied by her daughter.  History obtained from both of them.  She reports some decreased appetite.  Still eating.  No vomiting.  She was drinking an excessive amount of water daily.  Recently evaluated by nephrology.  Found to have low sodium.  Was informed to restrict her fluid intake to 1-1.5 liters per day and to decrease chlorthalidone.  Had f/u sodium yesterday that improved.  Has lost a lot of weight.  Unintentional weight loss.  No abdominal pain or cramping.  No bowel change.  Some persistent coughing - intermittent. Occasionally will get choked.   Daughter reports the possibility of some depressiion.  Increased stress.     Past Medical History  Diagnosis Date  . Hypertension   . Asthma   . GERD (gastroesophageal reflux disease)   . Hypercholesterolemia   . History of colon polyps   . Xanthogranulomatous pyelonephritis     s/p right nephrectomy  . Hypogammaglobulinemia (HCC)     IgA  . Atrial fibrillation [facility])    Past Surgical History  Procedure Laterality Date  . Abdominal hysterectomy      ovaries not removed  . Cesarean section    . Nephrectomy      right - for xanthogranulomatous pyelonephritis   Family History  Problem Relation Age of Onset  . Brain cancer Mother   . Liver cancer Father     questionabl colon cancer  . Breast cancer Neg Hx    Social History   Social History  . Marital Status: Married    Spouse Name: N/A  . Number of Children: 1  . Years of Education: N/A   Social History Main Topics  . Smoking status: Never Smoker   . Smokeless tobacco: Never Used   . Alcohol Use: No  . Drug Use: No  . Sexual Activity: Not Asked   Other Topics Concern  . None   Social History Narrative    Outpatient Encounter Prescriptions as of 11/14/2014  Medication Sig  . ADVAIR DISKUS 250-50 MCG/DOSE AEPB INHALE 1 PUFF BY MOUTH TWICE A DAY  . Blood Glucose Monitoring Suppl (ONE TOUCH ULTRA SYSTEM KIT) W/DEVICE KIT 1 kit by Does not apply route once.  . chlorthalidone (HYGROTON) 25 MG tablet TAKE 1 TABLET (25 MG TOTAL) BY MOUTH DAILY. (Patient taking differently: TAKE 1/2  TABLET (12.5 MG TOTAL) BY MOUTH DAILY.)  . Cholecalciferol (VITAMIN D) 2000 UNITS tablet Take 2,000 Units by mouth daily.  Marland Kitchen diltiazem (TIAZAC) 360 MG 24 hr capsule TAKE ONE CAPSULE BY MOUTH DAILY  . fluticasone (FLONASE) 50 MCG/ACT nasal spray USE 2 SPRAYS IN EACH NOSTRIL ONCE A DAY  . glucose blood (ONE TOUCH ULTRA TEST) test strip Check Blood sugar 2 times a day DX:  E11.9  . losartan (COZAAR) 100 MG tablet TAKE 1 TABLET BY MOUTH ONCE A DAY  . montelukast (SINGULAIR) 10 MG tablet TAKE 1 TABLET EVERY DAY  . pravastatin (PRAVACHOL) 20 MG tablet TAKE 1 TABLET (20 MG TOTAL) BY MOUTH DAILY.  Marland Kitchen PROAIR HFA 108 (90 BASE) MCG/ACT inhaler  INHALE 2 PUFF EVERY SIX HOURS AS NEEDED  . TRADJENTA 5 MG TABS tablet TAKE 1 TABLET (5 MG TOTAL) BY MOUTH DAILY.  Marland Kitchen warfarin (COUMADIN) 5 MG tablet As directed   No facility-administered encounter medications on file as of 11/14/2014.    Review of Systems  Constitutional:       Weight loss as outlined.  Decreased appetite.  Some fatigue.   HENT: Negative for congestion and sinus pressure.   Eyes: Negative for discharge and visual disturbance.  Respiratory: Positive for cough. Negative for chest tightness and shortness of breath.   Cardiovascular: Negative for chest pain, palpitations and leg swelling.  Gastrointestinal: Negative for nausea, vomiting, abdominal pain and diarrhea.  Genitourinary: Negative for dysuria and difficulty urinating.    Musculoskeletal: Negative for back pain and joint swelling.  Skin: Negative for color change and rash.  Neurological: Negative for dizziness, light-headedness and headaches.  Psychiatric/Behavioral:       Increased stress.  Some depression.         Objective:    Physical Exam  Constitutional: She appears well-developed and well-nourished. No distress.  HENT:  Nose: Nose normal.  Mouth/Throat: Oropharynx is clear and moist.  Eyes: Conjunctivae are normal. Right eye exhibits no discharge. Left eye exhibits no discharge.  Neck: Neck supple. No thyromegaly present.  Cardiovascular: Normal rate and regular rhythm.   Pulmonary/Chest: Breath sounds normal. No respiratory distress. She has no wheezes.  Abdominal: Soft. Bowel sounds are normal. There is no tenderness.  Musculoskeletal: She exhibits no edema or tenderness.  Lymphadenopathy:    She has no cervical adenopathy.  Skin: No rash noted. No erythema.  Psychiatric: She has a normal mood and affect. Her behavior is normal.    BP 142/70 mmHg  Pulse 85  Temp(Src) 97.8 F (36.6 C) (Oral)  Resp 17  Ht 5' (1.524 m)  Wt 116 lb (52.617 kg)  BMI 22.65 kg/m2  SpO2 97% Wt Readings from Last 3 Encounters:  11/14/14 116 lb (52.617 kg)  09/19/14 142 lb 6 oz (64.581 kg)  04/04/14 142 lb 4 oz (64.524 kg)     Lab Results  Component Value Date   WBC 5.3 11/14/2014   HGB 11.5* 11/14/2014   HCT 34.9* 11/14/2014   PLT 266.0 11/14/2014   GLUCOSE 106* 04/18/2014   CHOL 149 04/02/2014   TRIG 60.0 04/02/2014   HDL 61.70 04/02/2014   LDLCALC 75 04/02/2014   ALT 21 11/14/2014   AST 25 11/14/2014   NA 134* 04/18/2014   K 4.8 04/18/2014   CL 104 04/18/2014   CREATININE 2.55* 04/18/2014   BUN 60* 04/18/2014   CO2 27 04/18/2014   TSH 2.03 11/14/2014   HGBA1C 6.1 11/15/2014    Mm Screening Breast Tomo Bilateral  07/09/2014  CLINICAL DATA:  Screening. EXAM: DIGITAL SCREENING BILATERAL MAMMOGRAM WITH 3D TOMO WITH CAD COMPARISON:   Previous exam(s). ACR Breast Density Category c: The breast tissue is heterogeneously dense, which may obscure small masses. FINDINGS: There are no findings suspicious for malignancy. Images were processed with CAD. IMPRESSION: No mammographic evidence of malignancy. A result letter of this screening mammogram will be mailed directly to the patient. RECOMMENDATION: Screening mammogram in one year. (Code:SM-B-01Y) BI-RADS CATEGORY  1: Negative. Electronically Signed   By: Margarette Canada M.D.   On: 07/09/2014 13:23       Assessment & Plan:   Problem List Items Addressed This Visit    Anemia    Recheck cbc - given weight loss.  Atrial fibrillation ([place])    Followed by Dr Ubaldo Glassing.  On coumadin.  Stable.        CKD (chronic kidney disease) stage 4, GFR 15-29 ml/min (HCC)    Followed by nephrology.  Cr has been stable 2.3-2.8 per nephrology.        History of colonic polyps    Colonoscopy 10/31/12 - as outlined in overview.  Recommended f/u colonoscopy in three years.        Hypertension    Blood pressure has been under good control.  Continue same medication regimen.  Now on 1/2 chlorthalidone.  Follow pressures.  Follow metabolic panel.        Loss of weight - Primary    Significant amount of weight loss since her 08/2014 visit.  Unclear etiology.  Some decreased appetite.  She is eating.  Will check cbc, metabolic panel and thyroid function.  If unrevealing, check CT chest, abdomen and pelvis - given the weight loss and cough.  Further w/up pending.  May need referral to GI.  Follow depression symptoms.  Consider restarting SSRI.        Relevant Orders   CBC with Differential/Platelet (Completed)   Hepatic function panel (Completed)   TSH (Completed)   Vitamin B12 (Completed)   Polycythemia    Was followed by hematology.  Check cbc.        Type II diabetes mellitus with nephropathy ([place])    Sugars have been doing well.  She has lost weight.  No low blood sugars.  Follow.              Einar Pheasant, MD

## 2014-11-15 ENCOUNTER — Other Ambulatory Visit (INDEPENDENT_AMBULATORY_CARE_PROVIDER_SITE_OTHER): Payer: Medicare Other

## 2014-11-15 ENCOUNTER — Other Ambulatory Visit: Payer: Self-pay | Admitting: Internal Medicine

## 2014-11-15 DIAGNOSIS — D649 Anemia, unspecified: Secondary | ICD-10-CM | POA: Diagnosis not present

## 2014-11-15 DIAGNOSIS — E1121 Type 2 diabetes mellitus with diabetic nephropathy: Secondary | ICD-10-CM | POA: Diagnosis not present

## 2014-11-15 DIAGNOSIS — R05 Cough: Secondary | ICD-10-CM

## 2014-11-15 DIAGNOSIS — R630 Anorexia: Secondary | ICD-10-CM

## 2014-11-15 DIAGNOSIS — R634 Abnormal weight loss: Secondary | ICD-10-CM

## 2014-11-15 DIAGNOSIS — R059 Cough, unspecified: Secondary | ICD-10-CM

## 2014-11-15 LAB — IBC PANEL
Iron: 91 ug/dL (ref 42–145)
Saturation Ratios: 30.7 % (ref 20.0–50.0)
Transferrin: 212 mg/dL (ref 212.0–360.0)

## 2014-11-15 LAB — HEMOGLOBIN A1C: Hgb A1c MFr Bld: 6.1 % (ref 4.6–6.5)

## 2014-11-15 LAB — FERRITIN: FERRITIN: 101.1 ng/mL (ref 10.0–291.0)

## 2014-11-15 NOTE — Progress Notes (Signed)
Order placed for add on iron studies.  

## 2014-11-15 NOTE — Progress Notes (Signed)
Order placed for CT scans (chest, abdomen and pelvis)

## 2014-11-19 ENCOUNTER — Encounter: Payer: Self-pay | Admitting: Internal Medicine

## 2014-11-19 NOTE — Assessment & Plan Note (Signed)
Recheck cbc - given weight loss.

## 2014-11-19 NOTE — Assessment & Plan Note (Signed)
Followed by Dr Ubaldo Glassing.  On coumadin.  Stable.

## 2014-11-19 NOTE — Assessment & Plan Note (Signed)
Colonoscopy 10/31/12 - as outlined in overview.  Recommended f/u colonoscopy in three years.

## 2014-11-19 NOTE — Assessment & Plan Note (Signed)
Followed by nephrology.  Cr has been stable 2.3-2.8 per nephrology.

## 2014-11-19 NOTE — Assessment & Plan Note (Signed)
Significant amount of weight loss since her 08/2014 visit.  Unclear etiology.  Some decreased appetite.  She is eating.  Will check cbc, metabolic panel and thyroid function.  If unrevealing, check CT chest, abdomen and pelvis - given the weight loss and cough.  Further w/up pending.  May need referral to GI.  Follow depression symptoms.  Consider restarting SSRI.

## 2014-11-19 NOTE — Assessment & Plan Note (Signed)
Blood pressure has been under good control.  Continue same medication regimen.  Now on 1/2 chlorthalidone.  Follow pressures.  Follow metabolic panel.

## 2014-11-19 NOTE — Assessment & Plan Note (Signed)
Sugars have been doing well.  She has lost weight.  No low blood sugars.  Follow.

## 2014-11-19 NOTE — Assessment & Plan Note (Signed)
Was followed by hematology.  Check cbc.

## 2014-11-21 ENCOUNTER — Ambulatory Visit
Admission: RE | Admit: 2014-11-21 | Discharge: 2014-11-21 | Disposition: A | Discharge status: Home / Self Care | Payer: Medicare Other | Source: Ambulatory Visit | Attending: Internal Medicine | Admitting: Internal Medicine

## 2014-11-21 ENCOUNTER — Ambulatory Visit: Payer: Medicare Other

## 2014-11-21 DIAGNOSIS — R059 Cough, unspecified: Secondary | ICD-10-CM

## 2014-11-21 DIAGNOSIS — R634 Abnormal weight loss: Secondary | ICD-10-CM | POA: Insufficient documentation

## 2014-11-21 DIAGNOSIS — E041 Nontoxic single thyroid nodule: Secondary | ICD-10-CM | POA: Diagnosis not present

## 2014-11-21 DIAGNOSIS — I2510 Atherosclerotic heart disease of native coronary artery without angina pectoris: Secondary | ICD-10-CM | POA: Diagnosis not present

## 2014-11-21 DIAGNOSIS — R05 Cough: Secondary | ICD-10-CM

## 2014-11-21 DIAGNOSIS — N189 Chronic kidney disease, unspecified: Secondary | ICD-10-CM | POA: Insufficient documentation

## 2014-11-21 DIAGNOSIS — Z905 Acquired absence of kidney: Secondary | ICD-10-CM | POA: Insufficient documentation

## 2014-11-21 DIAGNOSIS — R630 Anorexia: Secondary | ICD-10-CM | POA: Diagnosis present

## 2014-11-21 DIAGNOSIS — I700 Atherosclerosis of aorta: Secondary | ICD-10-CM | POA: Insufficient documentation

## 2014-11-21 DIAGNOSIS — R911 Solitary pulmonary nodule: Secondary | ICD-10-CM | POA: Diagnosis not present

## 2014-11-21 DIAGNOSIS — I517 Cardiomegaly: Secondary | ICD-10-CM | POA: Diagnosis not present

## 2014-11-22 ENCOUNTER — Telehealth: Payer: Self-pay | Admitting: Internal Medicine

## 2014-11-22 NOTE — Telephone Encounter (Signed)
I did explain that Dr Nicki Reaper went home sick today, pt does have an appt on 11/29/2014. She just wants Dr Nicki Reaper to know ahead of time to see if she can get some medication before the appt. Daughter called about her mom (pt) being very difficult and out of it. She's using bathroom on herself at the Cat scan appt with no knowledge of doing that. Daughter wants some kind of medication to stablize her mind. Sleeping a lot not eating. Thank You!

## 2014-11-22 NOTE — Telephone Encounter (Signed)
If she is out of it and using the bathroom on herself and not eating, she needs to go to ER for evaluation.  Need to confirm nothing more acute going on.  This is a big change for her.  Even if  the daughter feels this is psychological, she needs evaluation there for this as well - this is a big change for her.  May need head scan.  Need to make sure no infection and may need psych evaluation given this significant change.  Thanks

## 2014-11-23 DIAGNOSIS — R5383 Other fatigue: Secondary | ICD-10-CM | POA: Diagnosis not present

## 2014-11-23 DIAGNOSIS — E119 Type 2 diabetes mellitus without complications: Secondary | ICD-10-CM | POA: Diagnosis not present

## 2014-11-23 DIAGNOSIS — R9431 Abnormal electrocardiogram [ECG] [EKG]: Secondary | ICD-10-CM | POA: Diagnosis not present

## 2014-11-23 DIAGNOSIS — N390 Urinary tract infection, site not specified: Secondary | ICD-10-CM | POA: Diagnosis not present

## 2014-11-23 DIAGNOSIS — J9811 Atelectasis: Secondary | ICD-10-CM | POA: Diagnosis not present

## 2014-11-23 DIAGNOSIS — Z79899 Other long term (current) drug therapy: Secondary | ICD-10-CM | POA: Diagnosis not present

## 2014-11-23 DIAGNOSIS — Z5181 Encounter for therapeutic drug level monitoring: Secondary | ICD-10-CM | POA: Diagnosis not present

## 2014-11-23 DIAGNOSIS — Z7901 Long term (current) use of anticoagulants: Secondary | ICD-10-CM | POA: Diagnosis not present

## 2014-11-23 DIAGNOSIS — I10 Essential (primary) hypertension: Secondary | ICD-10-CM | POA: Diagnosis not present

## 2014-11-23 DIAGNOSIS — R1084 Generalized abdominal pain: Secondary | ICD-10-CM | POA: Diagnosis not present

## 2014-11-23 DIAGNOSIS — R410 Disorientation, unspecified: Secondary | ICD-10-CM | POA: Diagnosis not present

## 2014-11-23 DIAGNOSIS — R531 Weakness: Secondary | ICD-10-CM | POA: Diagnosis not present

## 2014-11-23 DIAGNOSIS — Z9181 History of falling: Secondary | ICD-10-CM | POA: Diagnosis not present

## 2014-11-23 NOTE — Telephone Encounter (Signed)
I left another voicemail on daughters contact number urging her to take Joan Erickson to ER & to call me back. I have been unable to reach anyone all day on any phone numbers listed.

## 2014-11-23 NOTE — Telephone Encounter (Signed)
Left message on home number & daughters contact number.

## 2014-11-23 NOTE — Telephone Encounter (Signed)
Called pt's number and spoke to daughter.  Pt with increased confusion - asking her husband "who are you".  Also using the bathroom on herself.  She is not eating well.  Described as confused and more out of it.  Not bathing herself.  Has gone from independent to becoming dependent on her daughter.  D/w her at length.  Needs ER evaluation.  Needs head scan given this acute change.  Also, concern over underlying infection.  With recent hyponatremia, need to confirm electrolytes are wnl - especially with not eating, etc.  Needs w/up now.  pts daughter to take her to ER now.

## 2014-11-24 ENCOUNTER — Encounter: Payer: Self-pay | Admitting: Internal Medicine

## 2014-11-24 DIAGNOSIS — R410 Disorientation, unspecified: Secondary | ICD-10-CM | POA: Diagnosis not present

## 2014-11-24 DIAGNOSIS — R531 Weakness: Secondary | ICD-10-CM | POA: Diagnosis not present

## 2014-11-28 DIAGNOSIS — H353220 Exudative age-related macular degeneration, left eye, stage unspecified: Secondary | ICD-10-CM | POA: Diagnosis not present

## 2014-11-28 DIAGNOSIS — H353110 Nonexudative age-related macular degeneration, right eye, stage unspecified: Secondary | ICD-10-CM | POA: Diagnosis not present

## 2014-11-28 NOTE — Telephone Encounter (Signed)
Spoke with husband & he states that she is home & doing better since starting on the Abx. I also notified him to let us know is they have any concerns or if any changes.

## 2014-11-29 ENCOUNTER — Ambulatory Visit (INDEPENDENT_AMBULATORY_CARE_PROVIDER_SITE_OTHER): Payer: Medicare Other | Admitting: Internal Medicine

## 2014-11-29 ENCOUNTER — Encounter: Payer: Self-pay | Admitting: Internal Medicine

## 2014-11-29 VITALS — BP 118/60 | HR 82 | Temp 98.4°F | Resp 18 | Ht 60.0 in | Wt 118.0 lb

## 2014-11-29 DIAGNOSIS — F320 Major depressive disorder, single episode, mild: Secondary | ICD-10-CM

## 2014-11-29 DIAGNOSIS — R634 Abnormal weight loss: Secondary | ICD-10-CM

## 2014-11-29 DIAGNOSIS — I10 Essential (primary) hypertension: Secondary | ICD-10-CM | POA: Diagnosis not present

## 2014-11-29 DIAGNOSIS — E7800 Pure hypercholesterolemia, unspecified: Secondary | ICD-10-CM

## 2014-11-29 DIAGNOSIS — E049 Nontoxic goiter, unspecified: Secondary | ICD-10-CM | POA: Diagnosis not present

## 2014-11-29 DIAGNOSIS — F329 Major depressive disorder, single episode, unspecified: Secondary | ICD-10-CM

## 2014-11-29 DIAGNOSIS — F32A Depression, unspecified: Secondary | ICD-10-CM

## 2014-11-29 DIAGNOSIS — D751 Secondary polycythemia: Secondary | ICD-10-CM

## 2014-11-29 DIAGNOSIS — N184 Chronic kidney disease, stage 4 (severe): Secondary | ICD-10-CM

## 2014-11-29 DIAGNOSIS — E1121 Type 2 diabetes mellitus with diabetic nephropathy: Secondary | ICD-10-CM

## 2014-11-29 DIAGNOSIS — I4891 Unspecified atrial fibrillation: Secondary | ICD-10-CM

## 2014-11-29 DIAGNOSIS — Z860100 Personal history of colon polyps, unspecified: Secondary | ICD-10-CM

## 2014-11-29 DIAGNOSIS — E010 Iodine-deficiency related diffuse (endemic) goiter: Secondary | ICD-10-CM

## 2014-11-29 MED ORDER — CITALOPRAM HYDROBROMIDE 10 MG PO TABS: 10.0000 mg | ORAL_TABLET | Freq: Every day | ORAL | Status: DC

## 2014-11-29 NOTE — Telephone Encounter (Signed)
Has appt with me 11/29/14.  Keep appt.

## 2014-11-29 NOTE — Progress Notes (Signed)
Patient ID: Joan Erickson, female   DOB: 1931/11/28, 81 y.o.   MRN: [id number]   Subjective:    Patient ID: Joan Erickson, female    DOB: [birth year], 81 y.o.   MRN: [id number]  HPI  Patient with past history of hypercholesterolemia, GERD, hypertension, atrial fib and CKD.  She comes in today for ER follow up.  She was recently seen for weight loss.  See last note for details.  CT chest, abdomen and pelvis - unrevealing for an abnormality to account for the weight loss.  She did have thyroid nodule.  See report.  She was also recently felt to be more confused.  Diagnosed with uti.  Treated with abx.  Still taking the abx.  Feels better.  Is eating some better.  She is accompanied by her daughter.  History obtained from both of them.  Daughter feels she is having issues with anxiety and some depression.  Pt agrees.  Feels need something to help level things out.  Her bowels are doing well.  No abdominal pain.  Breathing stable.  No chest pain.  No increased heart rate or palpitations.     Past Medical History  Diagnosis Date  . Hypertension   . Asthma   . GERD (gastroesophageal reflux disease)   . Hypercholesterolemia   . History of colon polyps   . Xanthogranulomatous pyelonephritis     s/p right nephrectomy  . Hypogammaglobulinemia (HCC)     IgA  . Atrial fibrillation [facility])    Past Surgical History  Procedure Laterality Date  . Abdominal hysterectomy      ovaries not removed  . Cesarean section    . Nephrectomy      right - for xanthogranulomatous pyelonephritis   Family History  Problem Relation Age of Onset  . Brain cancer Mother   . Liver cancer Father     questionabl colon cancer  . Breast cancer Neg Hx    Social History   Social History  . Marital Status: Married    Spouse Name: N/A  . Number of Children: 1  . Years of Education: N/A   Social History Main Topics  . Smoking status: Never Smoker   . Smokeless tobacco: Never Used  . Alcohol Use: No  . Drug Use: No    . Sexual Activity: Not Asked   Other Topics Concern  . None   Social History Narrative    Outpatient Encounter Prescriptions as of 11/29/2014  Medication Sig  . ADVAIR DISKUS 250-50 MCG/DOSE AEPB INHALE 1 PUFF BY MOUTH TWICE A DAY  . Blood Glucose Monitoring Suppl (ONE TOUCH ULTRA SYSTEM KIT) W/DEVICE KIT 1 kit by Does not apply route once.  . [EXPIRED] cefUROXime (CEFTIN) 250 MG tablet Take by mouth.  . chlorthalidone (HYGROTON) 25 MG tablet TAKE 1 TABLET (25 MG TOTAL) BY MOUTH DAILY. (Patient taking differently: TAKE 1/2  TABLET (12.5 MG TOTAL) BY MOUTH DAILY.)  . Cholecalciferol (VITAMIN D) 2000 UNITS tablet Take 2,000 Units by mouth daily.  Marland Kitchen diltiazem (TIAZAC) 360 MG 24 hr capsule TAKE ONE CAPSULE BY MOUTH DAILY  . fluticasone (FLONASE) 50 MCG/ACT nasal spray USE 2 SPRAYS IN EACH NOSTRIL ONCE A DAY  . glucose blood (ONE TOUCH ULTRA TEST) test strip Check Blood sugar 2 times a day DX:  E11.9  . losartan (COZAAR) 100 MG tablet TAKE 1 TABLET BY MOUTH ONCE A DAY  . montelukast (SINGULAIR) 10 MG tablet TAKE 1 TABLET EVERY DAY  . pravastatin (PRAVACHOL) 20  MG tablet TAKE 1 TABLET (20 MG TOTAL) BY MOUTH DAILY.  Marland Kitchen PROAIR HFA 108 (90 BASE) MCG/ACT inhaler INHALE 2 PUFF EVERY SIX HOURS AS NEEDED  . TRADJENTA 5 MG TABS tablet TAKE 1 TABLET (5 MG TOTAL) BY MOUTH DAILY.  Marland Kitchen warfarin (COUMADIN) 5 MG tablet As directed  . citalopram (CELEXA) 10 MG tablet Take 1 tablet (10 mg total) by mouth daily.   No facility-administered encounter medications on file as of 11/29/2014.    Review of Systems  Constitutional:       Significant weight loss.  Appetite has improved some since her last visit.    HENT: Negative for congestion and sinus pressure.   Eyes: Negative for pain and discharge.  Respiratory: Negative for cough, chest tightness and shortness of breath.   Cardiovascular: Negative for chest pain, palpitations and leg swelling.  Gastrointestinal: Negative for nausea, vomiting, abdominal pain  and diarrhea.  Genitourinary: Negative for dysuria and difficulty urinating.  Musculoskeletal: Negative for back pain and joint swelling.  Skin: Negative for color change and rash.  Neurological: Negative for dizziness, light-headedness and headaches.  Psychiatric/Behavioral: Negative for dysphoric mood and agitation.       Does report some anxiety and some mild depression.         Objective:    Physical Exam  Constitutional: She appears well-developed and well-nourished. No distress.  HENT:  Nose: Nose normal.  Mouth/Throat: Oropharynx is clear and moist.  Eyes: Conjunctivae are normal. Right eye exhibits no discharge. Left eye exhibits no discharge.  Neck: Neck supple. No thyromegaly present.  Cardiovascular: Normal rate and regular rhythm.   Pulmonary/Chest: Breath sounds normal. No respiratory distress. She has no wheezes.  Abdominal: Soft. Bowel sounds are normal. There is no tenderness.  Musculoskeletal: She exhibits no edema or tenderness.  Lymphadenopathy:    She has no cervical adenopathy.  Skin: No rash noted. No erythema.  Psychiatric: She has a normal mood and affect. Her behavior is normal.    BP 118/60 mmHg  Pulse 82  Temp(Src) 98.4 F (36.9 C) (Oral)  Resp 18  Ht 5' (1.524 m)  Wt 118 lb (53.524 kg)  BMI 23.05 kg/m2  SpO2 96% Wt Readings from Last 3 Encounters:  11/29/14 118 lb (53.524 kg)  11/14/14 116 lb (52.617 kg)  09/19/14 142 lb 6 oz (64.581 kg)     Lab Results  Component Value Date   WBC 5.3 11/14/2014   HGB 11.5* 11/14/2014   HCT 34.9* 11/14/2014   PLT 266.0 11/14/2014   GLUCOSE 106* 04/18/2014   CHOL 149 04/02/2014   TRIG 60.0 04/02/2014   HDL 61.70 04/02/2014   LDLCALC 75 04/02/2014   ALT 21 11/14/2014   AST 25 11/14/2014   NA 134* 04/18/2014   K 4.8 04/18/2014   CL 104 04/18/2014   CREATININE 2.55* 04/18/2014   BUN 60* 04/18/2014   CO2 27 04/18/2014   TSH 2.03 11/14/2014   HGBA1C 6.1 11/15/2014    Ct Abdomen Pelvis Wo  Contrast  11/21/2014  CLINICAL DATA:  Unintentional weight loss. Cough. Prior right nephrectomy. EXAM: CT CHEST, ABDOMEN AND PELVIS WITHOUT CONTRAST TECHNIQUE: Multidetector CT imaging of the chest, abdomen and pelvis was performed following the standard protocol without IV contrast. COMPARISON:  Chest CT 03/26/2010 FINDINGS: CT CHEST FINDINGS Mediastinum/Lymph Nodes: There is cardiomegaly. Aorta is tortuous with scattered calcifications, non aneurysmal. Scattered coronary artery calcifications. No mediastinal, hilar, or axillary adenopathy. 3.4 cm mixed density lesion noted within the inferior left thyroid lobe.  Lungs/Pleura: No pulmonary mass, infiltrate, or effusion. Musculoskeletal: No chest wall mass or suspicious bone lesions identified. CT ABDOMEN PELVIS FINDINGS Hepatobiliary: No mass visualized on this un-enhanced exam. Pancreas: No mass or inflammatory process identified on this un-enhanced exam. Spleen: Within normal limits in size. Adrenals/Urinary Tract: Prior right nephrectomy. Compensatory hypertrophy of the left kidney without hydronephrosis or visible mass on this unenhanced study. Adrenals are unremarkable. Gas noted within the urinary bladder, presumably from recent catheterization. There are diverticula filled with gas along the anterior wall of the urinary bladder. No significant or focal bladder wall thickening. Stomach/Bowel: No evidence of obstruction, inflammatory process, or abnormal fluid collections. Vascular/Lymphatic: No pathologically enlarged lymph nodes. No evidence of abdominal aortic aneurysm. Calcifications throughout the aorta. Reproductive: Prior hysterectomy.  No adnexal masses. Other: No free fluid or free air. Musculoskeletal: Severe rightward scoliosis in the upper lumbar spine and associated degenerative changes. No acute or focal bony abnormality. IMPRESSION: No acute or significant abnormality noted in the chest, abdomen or pelvis. No explanation for the patient's weight  loss. Left thyroid nodule measures 3.3 cm, likely related to nodular goiter. Cardiomegaly, coronary artery and aortic atherosclerosis. Prior right nephrectomy. Gas within the urinary bladder, presumably related to recent catheterization. There are gas-filled diverticula along the anterior bladder wall. Electronically Signed   By: Rolm Baptise M.D.   On: 11/21/2014 09:10   Ct Chest Wo Contrast  11/21/2014  CLINICAL DATA:  Unintentional weight loss. Cough. Prior right nephrectomy. EXAM: CT CHEST, ABDOMEN AND PELVIS WITHOUT CONTRAST TECHNIQUE: Multidetector CT imaging of the chest, abdomen and pelvis was performed following the standard protocol without IV contrast. COMPARISON:  Chest CT 03/26/2010 FINDINGS: CT CHEST FINDINGS Mediastinum/Lymph Nodes: There is cardiomegaly. Aorta is tortuous with scattered calcifications, non aneurysmal. Scattered coronary artery calcifications. No mediastinal, hilar, or axillary adenopathy. 3.4 cm mixed density lesion noted within the inferior left thyroid lobe. Lungs/Pleura: No pulmonary mass, infiltrate, or effusion. Musculoskeletal: No chest wall mass or suspicious bone lesions identified. CT ABDOMEN PELVIS FINDINGS Hepatobiliary: No mass visualized on this un-enhanced exam. Pancreas: No mass or inflammatory process identified on this un-enhanced exam. Spleen: Within normal limits in size. Adrenals/Urinary Tract: Prior right nephrectomy. Compensatory hypertrophy of the left kidney without hydronephrosis or visible mass on this unenhanced study. Adrenals are unremarkable. Gas noted within the urinary bladder, presumably from recent catheterization. There are diverticula filled with gas along the anterior wall of the urinary bladder. No significant or focal bladder wall thickening. Stomach/Bowel: No evidence of obstruction, inflammatory process, or abnormal fluid collections. Vascular/Lymphatic: No pathologically enlarged lymph nodes. No evidence of abdominal aortic aneurysm.  Calcifications throughout the aorta. Reproductive: Prior hysterectomy.  No adnexal masses. Other: No free fluid or free air. Musculoskeletal: Severe rightward scoliosis in the upper lumbar spine and associated degenerative changes. No acute or focal bony abnormality. IMPRESSION: No acute or significant abnormality noted in the chest, abdomen or pelvis. No explanation for the patient's weight loss. Left thyroid nodule measures 3.3 cm, likely related to nodular goiter. Cardiomegaly, coronary artery and aortic atherosclerosis. Prior right nephrectomy. Gas within the urinary bladder, presumably related to recent catheterization. There are gas-filled diverticula along the anterior bladder wall. Electronically Signed   By: Rolm Baptise M.D.   On: 11/21/2014 09:10       Assessment & Plan:   Problem List Items Addressed This Visit    Atrial fibrillation ([place])    Appears to be in SR.  On coumadin.  Followed by cardiology.  CKD (chronic kidney disease) stage 4, GFR 15-29 ml/min (HCC)    Followed by nephrology.        History of colonic polyps    Colonoscopy 10/31/12 - two 6-72m polyps in the proximal ascending colon, otherwise normal.  Recommended f/u colonoscopy in three years.       Hypercholesterolemia    Low cholesterol diet and exercise.  Follow lipid panel and liver function tests.  On pravastatin.        Hypertension    Blood pressure under good control.  Continue same medication regimen.  Follow pressures.  Follow metabolic panel.        Loss of weight - Primary    Has had a significant amount of weight loss.  See last note.  CT chest, abdomen and pelvis as outlined.  No cause found.  Encourage increased po intake.  Treat anxiety and depression.  Refer to GI for evaluation.        Relevant Orders   Ambulatory referral to Gastroenterology   Mild depression    With mild depression and some anxiety.  Start citalopram 182mq day.  Follow.  Get her back in soon to reassess.         Relevant Medications   citalopram (CELEXA) 10 MG tablet   Polycythemia    Was followed by hematology.  Follow cbc.        Thyromegaly    Has seen Dr PaEddie Dibbles Biopsy negative.  Follow.        [place]   Sugars have been doing better.  Reports no low blood sugars.  Follow met b and a1c.           SCEinar PheasantMD

## 2014-11-29 NOTE — Progress Notes (Signed)
Pre-visit discussion using our clinic review tool. No additional management support is needed unless otherwise documented below in the visit note.  

## 2014-12-02 ENCOUNTER — Encounter: Payer: Self-pay | Admitting: Internal Medicine

## 2014-12-02 DIAGNOSIS — F32A Depression, unspecified: Secondary | ICD-10-CM | POA: Insufficient documentation

## 2014-12-02 DIAGNOSIS — F320 Major depressive disorder, single episode, mild: Secondary | ICD-10-CM | POA: Insufficient documentation

## 2014-12-02 NOTE — Assessment & Plan Note (Signed)
Blood pressure under good control.  Continue same medication regimen.  Follow pressures.  Follow metabolic panel.   

## 2014-12-02 NOTE — Assessment & Plan Note (Signed)
Sugars have been doing better.  Reports no low blood sugars.  Follow met b and a1c.

## 2014-12-02 NOTE — Assessment & Plan Note (Signed)
Low cholesterol diet and exercise.  Follow lipid panel and liver function tests.  On pravastatin.   

## 2014-12-02 NOTE — Assessment & Plan Note (Signed)
With mild depression and some anxiety.  Start citalopram 10mg  q day.  Follow.  Get her back in soon to reassess.

## 2014-12-02 NOTE — Assessment & Plan Note (Signed)
Followed by nephrology. 

## 2014-12-02 NOTE — Assessment & Plan Note (Signed)
Colonoscopy 10/31/12 - two 6-50mm polyps in the proximal ascending colon, otherwise normal.  Recommended f/u colonoscopy in three years.

## 2014-12-02 NOTE — Assessment & Plan Note (Signed)
Has had a significant amount of weight loss.  See last note.  CT chest, abdomen and pelvis as outlined.  No cause found.  Encourage increased po intake.  Treat anxiety and depression.  Refer to GI for evaluation.

## 2014-12-02 NOTE — Assessment & Plan Note (Signed)
Appears to be in SR.  On coumadin.  Followed by cardiology.  ?

## 2014-12-02 NOTE — Assessment & Plan Note (Signed)
Has seen Dr Paul.  Biopsy negative.  Follow.  

## 2014-12-02 NOTE — Assessment & Plan Note (Signed)
Was followed by hematology.  Follow cbc.  

## 2014-12-04 DIAGNOSIS — N184 Chronic kidney disease, stage 4 (severe): Secondary | ICD-10-CM | POA: Diagnosis not present

## 2014-12-04 DIAGNOSIS — E871 Hypo-osmolality and hyponatremia: Secondary | ICD-10-CM | POA: Diagnosis not present

## 2014-12-20 ENCOUNTER — Other Ambulatory Visit: Payer: Self-pay | Admitting: Internal Medicine

## 2015-01-01 LAB — HM DIABETES EYE EXAM

## 2015-01-07 ENCOUNTER — Ambulatory Visit: Payer: Medicare Other | Admitting: Internal Medicine

## 2015-01-21 ENCOUNTER — Encounter: Payer: Self-pay | Admitting: Internal Medicine

## 2015-01-22 ENCOUNTER — Ambulatory Visit: Payer: Medicare Other | Admitting: Internal Medicine

## 2015-01-22 NOTE — Telephone Encounter (Signed)
Reviewed her message and your message.  I do not believe the coumadin is causing her leg swelling.  She needs to have it evaluated before her appt with me.  I will then f/u with her at my appt, but this needs to be ruled out before her appt with me.  Thanks   Make sure she does bring all of her medication with her to her appt.

## 2015-01-22 NOTE — Telephone Encounter (Signed)
Given that she is having unilateral leg swelling and pain, she needs evaluation today to confirm no blood clot.  I recommend evaluation today.  ([facility], [facility] acute care or ER if other acute issues).  I do not want her waiting until 01/24/15 appt.

## 2015-01-24 ENCOUNTER — Ambulatory Visit (INDEPENDENT_AMBULATORY_CARE_PROVIDER_SITE_OTHER): Payer: Medicare Other | Admitting: Internal Medicine

## 2015-01-24 ENCOUNTER — Ambulatory Visit
Admission: RE | Admit: 2015-01-24 | Discharge: 2015-01-24 | Disposition: A | Discharge status: Home / Self Care | Payer: Medicare Other | Source: Ambulatory Visit | Attending: Internal Medicine | Admitting: Internal Medicine

## 2015-01-24 ENCOUNTER — Other Ambulatory Visit: Payer: Self-pay | Admitting: Internal Medicine

## 2015-01-24 ENCOUNTER — Encounter: Payer: Self-pay | Admitting: Internal Medicine

## 2015-01-24 VITALS — BP 130/70 | HR 91 | Temp 98.1°F | Resp 18 | Ht 60.0 in | Wt 127.0 lb

## 2015-01-24 DIAGNOSIS — M7989 Other specified soft tissue disorders: Secondary | ICD-10-CM | POA: Insufficient documentation

## 2015-01-24 DIAGNOSIS — R600 Localized edema: Secondary | ICD-10-CM | POA: Insufficient documentation

## 2015-01-24 DIAGNOSIS — N184 Chronic kidney disease, stage 4 (severe): Secondary | ICD-10-CM

## 2015-01-24 DIAGNOSIS — Z5181 Encounter for therapeutic drug level monitoring: Secondary | ICD-10-CM | POA: Diagnosis not present

## 2015-01-24 DIAGNOSIS — I10 Essential (primary) hypertension: Secondary | ICD-10-CM | POA: Diagnosis not present

## 2015-01-24 DIAGNOSIS — J4520 Mild intermittent asthma, uncomplicated: Secondary | ICD-10-CM

## 2015-01-24 DIAGNOSIS — I4891 Unspecified atrial fibrillation: Secondary | ICD-10-CM | POA: Diagnosis not present

## 2015-01-24 DIAGNOSIS — D649 Anemia, unspecified: Secondary | ICD-10-CM

## 2015-01-24 DIAGNOSIS — E7800 Pure hypercholesterolemia, unspecified: Secondary | ICD-10-CM

## 2015-01-24 DIAGNOSIS — F32A Depression, unspecified: Secondary | ICD-10-CM

## 2015-01-24 DIAGNOSIS — Z7901 Long term (current) use of anticoagulants: Secondary | ICD-10-CM

## 2015-01-24 DIAGNOSIS — E010 Iodine-deficiency related diffuse (endemic) goiter: Secondary | ICD-10-CM

## 2015-01-24 DIAGNOSIS — F320 Major depressive disorder, single episode, mild: Secondary | ICD-10-CM

## 2015-01-24 DIAGNOSIS — F329 Major depressive disorder, single episode, unspecified: Secondary | ICD-10-CM

## 2015-01-24 DIAGNOSIS — E049 Nontoxic goiter, unspecified: Secondary | ICD-10-CM

## 2015-01-24 DIAGNOSIS — R634 Abnormal weight loss: Secondary | ICD-10-CM

## 2015-01-24 DIAGNOSIS — E1121 Type 2 diabetes mellitus with diabetic nephropathy: Secondary | ICD-10-CM

## 2015-01-24 DIAGNOSIS — D751 Secondary polycythemia: Secondary | ICD-10-CM

## 2015-01-24 LAB — CBC WITH DIFFERENTIAL/PLATELET
BASOS ABS: 0 10*3/uL (ref 0.0–0.1)
Basophils Relative: 0.1 % (ref 0.0–3.0)
EOS ABS: 0 10*3/uL (ref 0.0–0.7)
Eosinophils Relative: 1 % (ref 0.0–5.0)
HEMATOCRIT: 30.9 % — AB (ref 36.0–46.0)
Hemoglobin: 10.3 g/dL — ABNORMAL LOW (ref 12.0–15.0)
LYMPHS PCT: 8.1 % — AB (ref 12.0–46.0)
Lymphs Abs: 0.4 10*3/uL — ABNORMAL LOW (ref 0.7–4.0)
MCHC: 33.2 g/dL (ref 30.0–36.0)
MCV: 87.8 fl (ref 78.0–100.0)
Monocytes Absolute: 0.9 10*3/uL (ref 0.1–1.0)
Monocytes Relative: 17.9 % — ABNORMAL HIGH (ref 3.0–12.0)
NEUTROS ABS: 3.6 10*3/uL (ref 1.4–7.7)
Neutrophils Relative %: 72.9 % (ref 43.0–77.0)
PLATELETS: 216 10*3/uL (ref 150.0–400.0)
RBC: 3.52 Mil/uL — ABNORMAL LOW (ref 3.87–5.11)
RDW: 16.7 % — ABNORMAL HIGH (ref 11.5–15.5)
WBC: 5 10*3/uL (ref 4.0–10.5)

## 2015-01-24 LAB — HEPATIC FUNCTION PANEL
ALK PHOS: 62 U/L (ref 39–117)
ALT: 11 U/L (ref 0–35)
AST: 19 U/L (ref 0–37)
Albumin: 3.2 g/dL — ABNORMAL LOW (ref 3.5–5.2)
BILIRUBIN DIRECT: 0.1 mg/dL (ref 0.0–0.3)
TOTAL PROTEIN: 6.6 g/dL (ref 6.0–8.3)
Total Bilirubin: 0.4 mg/dL (ref 0.2–1.2)

## 2015-01-24 LAB — PROTIME-INR
INR: 2.1 ratio — ABNORMAL HIGH (ref 0.8–1.0)
Prothrombin Time: 22.7 s — ABNORMAL HIGH (ref 9.6–13.1)

## 2015-01-24 LAB — BASIC METABOLIC PANEL
BUN: 58 mg/dL — AB (ref 6–23)
CHLORIDE: 104 meq/L (ref 96–112)
CO2: 25 meq/L (ref 19–32)
CREATININE: 2.88 mg/dL — AB (ref 0.40–1.20)
Calcium: 9.1 mg/dL (ref 8.4–10.5)
GFR: 20.18 mL/min — ABNORMAL LOW (ref >60.00)
Glucose, Bld: 98 mg/dL (ref 70–99)
Potassium: 4.9 mEq/L (ref 3.5–5.1)
Sodium: 135 mEq/L (ref 135–145)

## 2015-01-24 NOTE — Progress Notes (Signed)
Patient ID: [name], female   DOB: 1931-07-01, 82 y.o.   MRN: [id number]   Subjective:    Patient ID: Joan Erickson, female    DOB: 05/26/1931, 82 y.o.   MRN: [id number]  HPI  Patient with past history of hypertension, CKD, atrial fib, diabetes and hypercholesterolemia.  She comes in today to follow up on these issues.  She had been having problems with decreased po intake and weight loss.  See last note for details.  Concern over depression and anxiety.  She was started on citalopram.  Feels better.  Eating better.   Weight is up.  No chest pain or tightness.  Some minimal cough, but feels overall her breathing is stable.  No sob.  No acid reflux.  No abdominal pain or cramping.  Overall feels better.  Has had leg swelling.  Right leg.  Worsened over this past week.  Is better today per her report.  No increased erythema.     Past Medical History  Diagnosis Date  . Hypertension   . Asthma   . GERD (gastroesophageal reflux disease)   . Hypercholesterolemia   . History of colon polyps   . Xanthogranulomatous pyelonephritis     s/p right nephrectomy  . Hypogammaglobulinemia (HCC)     IgA  . Atrial fibrillation [facility])    Past Surgical History  Procedure Laterality Date  . Abdominal hysterectomy      ovaries not removed  . Cesarean section    . Nephrectomy      right - for xanthogranulomatous pyelonephritis   Family History  Problem Relation Age of Onset  . Brain cancer Mother   . Liver cancer Father     questionabl colon cancer  . Breast cancer Neg Hx    Social History   Social History  . Marital Status: Married    Spouse Name: N/A  . Number of Children: 1  . Years of Education: N/A   Social History Main Topics  . Smoking status: Never Smoker   . Smokeless tobacco: Never Used  . Alcohol Use: No  . Drug Use: No  . Sexual Activity: Not Asked   Other Topics Concern  . None   Social History Narrative    Outpatient Encounter Prescriptions as of 01/24/2015    Medication Sig  . ADVAIR DISKUS 250-50 MCG/DOSE AEPB INHALE 1 PUFF BY MOUTH TWICE A DAY  . Blood Glucose Monitoring Suppl (ONE TOUCH ULTRA SYSTEM KIT) W/DEVICE KIT 1 kit by Does not apply route once.  . chlorthalidone (HYGROTON) 25 MG tablet TAKE 1 TABLET (25 MG TOTAL) BY MOUTH DAILY. (Patient taking differently: TAKE 1/2  TABLET (12.5 MG TOTAL) BY MOUTH DAILY.)  . Cholecalciferol (VITAMIN D) 2000 UNITS tablet Take 2,000 Units by mouth daily.  . citalopram (CELEXA) 10 MG tablet Take 1 tablet (10 mg total) by mouth daily.  Marland Kitchen diltiazem (TIAZAC) 360 MG 24 hr capsule TAKE ONE CAPSULE BY MOUTH DAILY  . fluticasone (FLONASE) 50 MCG/ACT nasal spray USE 2 SPRAYS IN EACH NOSTRIL ONCE A DAY  . glucose blood (ONE TOUCH ULTRA TEST) test strip Check Blood sugar 2 times a day DX:  E11.9  . losartan (COZAAR) 100 MG tablet TAKE 1 TABLET BY MOUTH ONCE A DAY  . montelukast (SINGULAIR) 10 MG tablet TAKE 1 TABLET EVERY DAY  . pravastatin (PRAVACHOL) 20 MG tablet TAKE 1 TABLET (20 MG TOTAL) BY MOUTH DAILY.  Marland Kitchen PROAIR HFA 108 (90 BASE) MCG/ACT inhaler INHALE 2 PUFF EVERY SIX  HOURS AS NEEDED  . TRADJENTA 5 MG TABS tablet TAKE 1 TABLET (5 MG TOTAL) BY MOUTH DAILY.  Marland Kitchen warfarin (COUMADIN) 1 MG tablet Take 1 mg by mouth daily. Take 1/2 tablet as directed  . warfarin (COUMADIN) 5 MG tablet As directed   No facility-administered encounter medications on file as of 01/24/2015.    Review of Systems  Constitutional:       Appetite has improved.  Weight is coming up.    HENT: Negative for congestion and sinus pressure.   Eyes: Negative for discharge and redness.  Respiratory: Positive for cough (minimal cough.  ). Negative for chest tightness and shortness of breath.   Cardiovascular: Positive for leg swelling. Negative for chest pain and palpitations.  Gastrointestinal: Negative for nausea, vomiting, abdominal pain and diarrhea.  Genitourinary: Negative for dysuria and difficulty urinating.  Musculoskeletal: Negative  for back pain and joint swelling.  Skin: Negative for color change and rash.  Neurological: Negative for dizziness, light-headedness and headaches.  Psychiatric/Behavioral: Negative for dysphoric mood and agitation.       Objective:    Physical Exam  Constitutional: She appears well-developed and well-nourished. No distress.  HENT:  Nose: Nose normal.  Mouth/Throat: Oropharynx is clear and moist.  Eyes: Conjunctivae are normal. Right eye exhibits no discharge. Left eye exhibits no discharge.  Neck: Neck supple. No thyromegaly present.  Cardiovascular: Normal rate and regular rhythm.   Pulmonary/Chest: Breath sounds normal. No respiratory distress. She has no wheezes.  Abdominal: Soft. Bowel sounds are normal. There is no tenderness.  Musculoskeletal: She exhibits no tenderness.  Right lower extremity edema - pedal and lower extremity swelling.  No increased erythema.  Pitting edema.    Lymphadenopathy:    She has no cervical adenopathy.  Skin: No rash noted. No erythema.  Psychiatric: She has a normal mood and affect. Her behavior is normal.    BP 130/70 mmHg  Pulse 91  Temp(Src) 98.1 F (36.7 C) (Oral)  Resp 18  Ht 5' (1.524 m)  Wt 127 lb (57.607 kg)  BMI 24.80 kg/m2  SpO2 97% Wt Readings from Last 3 Encounters:  01/24/15 127 lb (57.607 kg)  11/29/14 118 lb (53.524 kg)  11/14/14 116 lb (52.617 kg)     Lab Results  Component Value Date   WBC 5.0 01/24/2015   HGB 10.3* 01/24/2015   HCT 30.9* 01/24/2015   PLT 216.0 01/24/2015   GLUCOSE 98 01/24/2015   CHOL 149 04/02/2014   TRIG 60.0 04/02/2014   HDL 61.70 04/02/2014   LDLCALC 75 04/02/2014   ALT 11 01/24/2015   AST 19 01/24/2015   NA 135 01/24/2015   K 4.9 01/24/2015   CL 104 01/24/2015   CREATININE 2.88* 01/24/2015   BUN 58* 01/24/2015   CO2 25 01/24/2015   TSH 2.03 11/14/2014   INR 2.1* 01/24/2015   HGBA1C 6.1 11/15/2014    Ct Abdomen Pelvis Wo Contrast  11/21/2014  CLINICAL DATA:  Unintentional  weight loss. Cough. Prior right nephrectomy. EXAM: CT CHEST, ABDOMEN AND PELVIS WITHOUT CONTRAST TECHNIQUE: Multidetector CT imaging of the chest, abdomen and pelvis was performed following the standard protocol without IV contrast. COMPARISON:  Chest CT 03/26/2010 FINDINGS: CT CHEST FINDINGS Mediastinum/Lymph Nodes: There is cardiomegaly. Aorta is tortuous with scattered calcifications, non aneurysmal. Scattered coronary artery calcifications. No mediastinal, hilar, or axillary adenopathy. 3.4 cm mixed density lesion noted within the inferior left thyroid lobe. Lungs/Pleura: No pulmonary mass, infiltrate, or effusion. Musculoskeletal: No chest wall mass or suspicious  bone lesions identified. CT ABDOMEN PELVIS FINDINGS Hepatobiliary: No mass visualized on this un-enhanced exam. Pancreas: No mass or inflammatory process identified on this un-enhanced exam. Spleen: Within normal limits in size. Adrenals/Urinary Tract: Prior right nephrectomy. Compensatory hypertrophy of the left kidney without hydronephrosis or visible mass on this unenhanced study. Adrenals are unremarkable. Gas noted within the urinary bladder, presumably from recent catheterization. There are diverticula filled with gas along the anterior wall of the urinary bladder. No significant or focal bladder wall thickening. Stomach/Bowel: No evidence of obstruction, inflammatory process, or abnormal fluid collections. Vascular/Lymphatic: No pathologically enlarged lymph nodes. No evidence of abdominal aortic aneurysm. Calcifications throughout the aorta. Reproductive: Prior hysterectomy.  No adnexal masses. Other: No free fluid or free air. Musculoskeletal: Severe rightward scoliosis in the upper lumbar spine and associated degenerative changes. No acute or focal bony abnormality. IMPRESSION: No acute or significant abnormality noted in the chest, abdomen or pelvis. No explanation for the patient's weight loss. Left thyroid nodule measures 3.3 cm, likely  related to nodular goiter. Cardiomegaly, coronary artery and aortic atherosclerosis. Prior right nephrectomy. Gas within the urinary bladder, presumably related to recent catheterization. There are gas-filled diverticula along the anterior bladder wall. Electronically Signed   By: Rolm Baptise M.D.   On: 11/21/2014 09:10   Ct Chest Wo Contrast  11/21/2014  CLINICAL DATA:  Unintentional weight loss. Cough. Prior right nephrectomy. EXAM: CT CHEST, ABDOMEN AND PELVIS WITHOUT CONTRAST TECHNIQUE: Multidetector CT imaging of the chest, abdomen and pelvis was performed following the standard protocol without IV contrast. COMPARISON:  Chest CT 03/26/2010 FINDINGS: CT CHEST FINDINGS Mediastinum/Lymph Nodes: There is cardiomegaly. Aorta is tortuous with scattered calcifications, non aneurysmal. Scattered coronary artery calcifications. No mediastinal, hilar, or axillary adenopathy. 3.4 cm mixed density lesion noted within the inferior left thyroid lobe. Lungs/Pleura: No pulmonary mass, infiltrate, or effusion. Musculoskeletal: No chest wall mass or suspicious bone lesions identified. CT ABDOMEN PELVIS FINDINGS Hepatobiliary: No mass visualized on this un-enhanced exam. Pancreas: No mass or inflammatory process identified on this un-enhanced exam. Spleen: Within normal limits in size. Adrenals/Urinary Tract: Prior right nephrectomy. Compensatory hypertrophy of the left kidney without hydronephrosis or visible mass on this unenhanced study. Adrenals are unremarkable. Gas noted within the urinary bladder, presumably from recent catheterization. There are diverticula filled with gas along the anterior wall of the urinary bladder. No significant or focal bladder wall thickening. Stomach/Bowel: No evidence of obstruction, inflammatory process, or abnormal fluid collections. Vascular/Lymphatic: No pathologically enlarged lymph nodes. No evidence of abdominal aortic aneurysm. Calcifications throughout the aorta. Reproductive:  Prior hysterectomy.  No adnexal masses. Other: No free fluid or free air. Musculoskeletal: Severe rightward scoliosis in the upper lumbar spine and associated degenerative changes. No acute or focal bony abnormality. IMPRESSION: No acute or significant abnormality noted in the chest, abdomen or pelvis. No explanation for the patient's weight loss. Left thyroid nodule measures 3.3 cm, likely related to nodular goiter. Cardiomegaly, coronary artery and aortic atherosclerosis. Prior right nephrectomy. Gas within the urinary bladder, presumably related to recent catheterization. There are gas-filled diverticula along the anterior bladder wall. Electronically Signed   By: Rolm Baptise M.D.   On: 11/21/2014 09:10       Assessment & Plan:   Problem List Items Addressed This Visit    Anemia    Follow cbc.        Asthma    Breathing stable.        Atrial fibrillation ([employer])    On coumadin.  PT/INR  followed by cardiology.  Rate controlled.  No increased heart rate.  Follow.        Relevant Medications   warfarin (COUMADIN) 1 MG tablet   CKD (chronic kidney disease) stage 4, GFR 15-29 ml/min (HCC)    Followed by nephrology.  Avoid nephrotoxins.        Hypercholesterolemia    Low cholesterol diet and exercise.  On pravastatin.  Follow lipid panel and liver function tests.        Relevant Medications   warfarin (COUMADIN) 1 MG tablet   Hypertension    Blood pressure doing well on current regimen.  Follow pressures.  Follow metabolic panel.        Relevant Medications   warfarin (COUMADIN) 1 MG tablet   Loss of weight    Appetite is better.  Weight is better.  Follow.       Mild depression    On citalopram.  Doing better.  Follow.        Polycythemia    Follow cbc.  Was evaluated by hematology.        Swelling of right lower extremity - Primary    Increased swelling - unilateral.  Unclear etiology.  Breathing stable.  Check lower extremity ultrasound.  May need vascular surgery  evaluation.        Relevant Orders   US Venous Img Lower Unilateral Right (Completed)   CBC with Differential/Platelet (Completed)   Hepatic function panel (Completed)   Basic metabolic panel (Completed)   Thyromegaly    Has seen Dr Eddie Dibbles.  Biopsy negative.  CT reviewed.  Follow.  Desired to hold on reevaluation.        Type II diabetes mellitus with nephropathy (HCC)    Low carb diet and exercise.  Follow met b and a1c.         Other Visit Diagnoses    Anticoagulated on Coumadin        Relevant Orders    Protime-INR (Completed)        Einar Pheasant, MD

## 2015-01-24 NOTE — Progress Notes (Signed)
Pre-visit discussion using our clinic review tool. No additional management support is needed unless otherwise documented below in the visit note.  

## 2015-01-25 ENCOUNTER — Other Ambulatory Visit: Payer: Self-pay | Admitting: Internal Medicine

## 2015-01-25 DIAGNOSIS — D649 Anemia, unspecified: Secondary | ICD-10-CM

## 2015-01-25 NOTE — Progress Notes (Signed)
Order placed for f/u labs.  

## 2015-01-26 ENCOUNTER — Other Ambulatory Visit: Payer: Self-pay | Admitting: Internal Medicine

## 2015-01-27 ENCOUNTER — Encounter: Payer: Self-pay | Admitting: Internal Medicine

## 2015-01-27 NOTE — Assessment & Plan Note (Signed)
Follow cbc.  

## 2015-01-27 NOTE — Assessment & Plan Note (Signed)
Follow cbc.  Was evaluated by hematology.

## 2015-01-27 NOTE — Assessment & Plan Note (Signed)
On citalopram.  Doing better.  Follow.   

## 2015-01-27 NOTE — Assessment & Plan Note (Signed)
Breathing stable.

## 2015-01-27 NOTE — Assessment & Plan Note (Signed)
Low carb diet and exercise.  Follow met b and a1c.   

## 2015-01-27 NOTE — Assessment & Plan Note (Signed)
Appetite is better.  Weight is better.  Follow.

## 2015-01-27 NOTE — Assessment & Plan Note (Signed)
Low cholesterol diet and exercise.  On pravastatin.  Follow lipid panel and liver function tests.   

## 2015-01-27 NOTE — Assessment & Plan Note (Signed)
Has seen Dr Eddie Dibbles.  Biopsy negative.  CT reviewed.  Follow.  Desired to hold on reevaluation.

## 2015-01-27 NOTE — Assessment & Plan Note (Signed)
On coumadin.  PT/INR followed by cardiology.  Rate controlled.  No increased heart rate.  Follow.

## 2015-01-27 NOTE — Assessment & Plan Note (Signed)
Blood pressure doing well on current regimen.  Follow pressures.  Follow metabolic panel.   

## 2015-01-27 NOTE — Assessment & Plan Note (Signed)
Followed by nephrology.  Avoid nephrotoxins.

## 2015-01-27 NOTE — Assessment & Plan Note (Signed)
Increased swelling - unilateral.  Unclear etiology.  Breathing stable.  Check lower extremity ultrasound.  May need vascular surgery evaluation.

## 2015-01-30 ENCOUNTER — Other Ambulatory Visit: Payer: Self-pay | Admitting: Internal Medicine

## 2015-02-01 ENCOUNTER — Other Ambulatory Visit (INDEPENDENT_AMBULATORY_CARE_PROVIDER_SITE_OTHER): Payer: Medicare Other

## 2015-02-01 DIAGNOSIS — E1121 Type 2 diabetes mellitus with diabetic nephropathy: Secondary | ICD-10-CM | POA: Diagnosis not present

## 2015-02-01 DIAGNOSIS — D649 Anemia, unspecified: Secondary | ICD-10-CM

## 2015-02-01 DIAGNOSIS — I10 Essential (primary) hypertension: Secondary | ICD-10-CM | POA: Diagnosis not present

## 2015-02-01 DIAGNOSIS — E7800 Pure hypercholesterolemia, unspecified: Secondary | ICD-10-CM | POA: Diagnosis not present

## 2015-02-01 LAB — HEPATIC FUNCTION PANEL
ALT: 15 U/L (ref 0–35)
AST: 23 U/L (ref 0–37)
Albumin: 3.1 g/dL — ABNORMAL LOW (ref 3.5–5.2)
Alkaline Phosphatase: 59 U/L (ref 39–117)
BILIRUBIN DIRECT: 0.1 mg/dL (ref 0.0–0.3)
TOTAL PROTEIN: 6.7 g/dL (ref 6.0–8.3)
Total Bilirubin: 0.3 mg/dL (ref 0.2–1.2)

## 2015-02-01 LAB — CBC WITH DIFFERENTIAL/PLATELET
BASOS ABS: 0 10*3/uL (ref 0.0–0.1)
Basophils Relative: 0.1 % (ref 0.0–3.0)
Eosinophils Absolute: 0.1 10*3/uL (ref 0.0–0.7)
Eosinophils Relative: 0.8 % (ref 0.0–5.0)
HCT: 31.1 % — ABNORMAL LOW (ref 36.0–46.0)
Hemoglobin: 10.1 g/dL — ABNORMAL LOW (ref 12.0–15.0)
LYMPHS ABS: 0.8 10*3/uL (ref 0.7–4.0)
Lymphocytes Relative: 11 % — ABNORMAL LOW (ref 12.0–46.0)
MCHC: 32.6 g/dL (ref 30.0–36.0)
MCV: 88.8 fl (ref 78.0–100.0)
MONO ABS: 0.8 10*3/uL (ref 0.1–1.0)
MONOS PCT: 11.1 % (ref 3.0–12.0)
NEUTROS PCT: 77 % (ref 43.0–77.0)
Neutro Abs: 5.6 10*3/uL (ref 1.4–7.7)
Platelets: 297 10*3/uL (ref 150.0–400.0)
RBC: 3.5 Mil/uL — AB (ref 3.87–5.11)
RDW: 16.3 % — ABNORMAL HIGH (ref 11.5–15.5)
WBC: 7.3 10*3/uL (ref 4.0–10.5)

## 2015-02-01 LAB — LIPID PANEL
CHOLESTEROL: 168 mg/dL (ref 0–200)
HDL: 65 mg/dL (ref >39.00)
LDL CALC: 92 mg/dL (ref 0–99)
NonHDL: 102.85
TRIGLYCERIDES: 56 mg/dL (ref 0.0–149.0)
Total CHOL/HDL Ratio: 3
VLDL: 11.2 mg/dL (ref 0.0–40.0)

## 2015-02-01 LAB — FERRITIN: Ferritin: 71.7 ng/mL (ref 10.0–291.0)

## 2015-02-01 LAB — VITAMIN B12: VITAMIN B 12: 485 pg/mL (ref 211–911)

## 2015-02-01 LAB — IBC PANEL
IRON: 55 ug/dL (ref 42–145)
SATURATION RATIOS: 18.7 % — AB (ref 20.0–50.0)
TRANSFERRIN: 210 mg/dL — AB (ref 212.0–360.0)

## 2015-02-01 LAB — BASIC METABOLIC PANEL
BUN: 59 mg/dL — ABNORMAL HIGH (ref 6–23)
CALCIUM: 9 mg/dL (ref 8.4–10.5)
CO2: 25 mEq/L (ref 19–32)
Chloride: 103 mEq/L (ref 96–112)
Creatinine, Ser: 2.75 mg/dL — ABNORMAL HIGH (ref 0.40–1.20)
GFR: 21.29 mL/min — AB (ref >60.00)
GLUCOSE: 107 mg/dL — AB (ref 70–99)
Potassium: 4.8 mEq/L (ref 3.5–5.1)
SODIUM: 133 meq/L — AB (ref 135–145)

## 2015-02-01 LAB — HEMOGLOBIN A1C: Hgb A1c MFr Bld: 5.5 % (ref 4.6–6.5)

## 2015-02-04 ENCOUNTER — Other Ambulatory Visit: Payer: Self-pay | Admitting: Internal Medicine

## 2015-02-04 DIAGNOSIS — D649 Anemia, unspecified: Secondary | ICD-10-CM

## 2015-02-04 NOTE — Progress Notes (Signed)
Order placed for GI referral.   

## 2015-02-14 DIAGNOSIS — D649 Anemia, unspecified: Secondary | ICD-10-CM | POA: Diagnosis not present

## 2015-02-19 DIAGNOSIS — I480 Paroxysmal atrial fibrillation: Secondary | ICD-10-CM | POA: Diagnosis not present

## 2015-02-20 ENCOUNTER — Telehealth: Payer: Self-pay | Admitting: *Deleted

## 2015-02-20 DIAGNOSIS — H43813 Vitreous degeneration, bilateral: Secondary | ICD-10-CM | POA: Diagnosis not present

## 2015-02-20 DIAGNOSIS — H353221 Exudative age-related macular degeneration, left eye, with active choroidal neovascularization: Secondary | ICD-10-CM | POA: Diagnosis not present

## 2015-02-20 DIAGNOSIS — H353110 Nonexudative age-related macular degeneration, right eye, stage unspecified: Secondary | ICD-10-CM | POA: Diagnosis not present

## 2015-02-20 NOTE — Telephone Encounter (Signed)
FYI: A letter was mailed to patient from [facility]  Nephrology. She missed her appt with Tawni Levy today. So, letter was mailed to patient to try & reschedule.

## 2015-03-01 DIAGNOSIS — N184 Chronic kidney disease, stage 4 (severe): Secondary | ICD-10-CM | POA: Diagnosis not present

## 2015-03-01 DIAGNOSIS — E871 Hypo-osmolality and hyponatremia: Secondary | ICD-10-CM | POA: Diagnosis not present

## 2015-03-01 DIAGNOSIS — D45 Polycythemia vera: Secondary | ICD-10-CM | POA: Diagnosis not present

## 2015-03-04 ENCOUNTER — Telehealth: Payer: Self-pay | Admitting: *Deleted

## 2015-03-04 ENCOUNTER — Other Ambulatory Visit: Payer: Self-pay | Admitting: Internal Medicine

## 2015-03-04 ENCOUNTER — Other Ambulatory Visit
Admission: RE | Admit: 2015-03-04 | Discharge: 2015-03-04 | Disposition: A | Discharge status: Home / Self Care | Payer: Medicare Other | Source: Ambulatory Visit | Attending: Internal Medicine | Admitting: Internal Medicine

## 2015-03-04 DIAGNOSIS — Z7901 Long term (current) use of anticoagulants: Secondary | ICD-10-CM

## 2015-03-04 DIAGNOSIS — Z5181 Encounter for therapeutic drug level monitoring: Secondary | ICD-10-CM | POA: Diagnosis not present

## 2015-03-04 LAB — PROTIME-INR
INR: 2.44
Prothrombin Time: 26.2 seconds — ABNORMAL HIGH (ref 11.4–15.0)

## 2015-03-04 NOTE — Telephone Encounter (Signed)
FYI Patient was seen at [facility] on Friday 03/01/15,at her requested she had a PT/INR drawn in which, was greater than 10.  Nurse practitioner [phone]

## 2015-03-04 NOTE — Progress Notes (Signed)
Notified Joan Erickson of pts inr result.  He will go get Joan Erickson now and take her to the hospital now for lab draw.  Stat pt/inr ordered.  Notified nephrology.

## 2015-03-04 NOTE — Telephone Encounter (Signed)
Spoke with the NP at [facility].  Patient was there getting labs drawn and requested a INR be drawn also.  NP stated that INR was Greater then 10.  Please advise.  They have attempted to reach the patient she is not answering her phone and they are not able to leave a message.  Thanks.

## 2015-03-04 NOTE — Telephone Encounter (Signed)
I am unable to get in touch with Joan Erickson.  I did find another number for the daughter - [phone].  Left message.  I also spoke to the NP at the kidney center and informed unable to reach.  I gave her the number above and also explained the cardiology follows her PT/INR.  Gave her cardiology's number as well to see if they had another number if unable to reach at above.  Also informed her to call if unable to reach or needed anything more.

## 2015-03-06 DIAGNOSIS — N184 Chronic kidney disease, stage 4 (severe): Secondary | ICD-10-CM | POA: Diagnosis not present

## 2015-03-06 DIAGNOSIS — E559 Vitamin D deficiency, unspecified: Secondary | ICD-10-CM | POA: Diagnosis not present

## 2015-03-06 NOTE — Telephone Encounter (Signed)
See lab note here.  Please make sure she does have scheduled f/u pt/inr at [facility].  Thanks

## 2015-03-06 NOTE — Telephone Encounter (Signed)
I was able to get  A hold of Mrs. Joan Erickson this am.  She did get a call at some point on Tuesday from the doctor and had her labs redrawn at the [facility].  She follows up with the Kidney center today at 1130.  She will follow up with [place] as needed.  thanks

## 2015-03-06 NOTE — Telephone Encounter (Signed)
I spoke with her again.  She has a followup lab appt at [facility] on February 28th per cardiology.  Thanks, see result note.

## 2015-03-08 ENCOUNTER — Encounter: Payer: Self-pay | Admitting: *Deleted

## 2015-03-08 ENCOUNTER — Telehealth: Payer: Self-pay | Admitting: Internal Medicine

## 2015-03-08 NOTE — Telephone Encounter (Signed)
Pt wanted to know if she was able to start going back to her exercise class and the gym.Marland Kitchen Please advise pt

## 2015-03-08 NOTE — Telephone Encounter (Signed)
I am ok with her starting back exercise - start slowly and increase gradually.  She needs to reschedule her appt she recently missed if not already scheduled.

## 2015-03-08 NOTE — Telephone Encounter (Signed)
Sent mychart message

## 2015-03-18 DIAGNOSIS — I480 Paroxysmal atrial fibrillation: Secondary | ICD-10-CM | POA: Diagnosis not present

## 2015-03-23 ENCOUNTER — Other Ambulatory Visit: Payer: Self-pay | Admitting: Internal Medicine

## 2015-03-25 NOTE — Telephone Encounter (Signed)
Unread mychart message mailed to patient 

## 2015-03-29 DIAGNOSIS — D649 Anemia, unspecified: Secondary | ICD-10-CM | POA: Diagnosis not present

## 2015-04-02 ENCOUNTER — Other Ambulatory Visit: Payer: Self-pay | Admitting: Internal Medicine

## 2015-04-10 DIAGNOSIS — H353221 Exudative age-related macular degeneration, left eye, with active choroidal neovascularization: Secondary | ICD-10-CM | POA: Diagnosis not present

## 2015-04-10 DIAGNOSIS — H43813 Vitreous degeneration, bilateral: Secondary | ICD-10-CM | POA: Diagnosis not present

## 2015-04-10 DIAGNOSIS — H2513 Age-related nuclear cataract, bilateral: Secondary | ICD-10-CM | POA: Diagnosis not present

## 2015-04-10 DIAGNOSIS — H353110 Nonexudative age-related macular degeneration, right eye, stage unspecified: Secondary | ICD-10-CM | POA: Diagnosis not present

## 2015-04-18 DIAGNOSIS — I480 Paroxysmal atrial fibrillation: Secondary | ICD-10-CM | POA: Diagnosis not present

## 2015-04-29 DIAGNOSIS — I480 Paroxysmal atrial fibrillation: Secondary | ICD-10-CM | POA: Diagnosis not present

## 2015-05-07 ENCOUNTER — Other Ambulatory Visit: Payer: Self-pay | Admitting: Internal Medicine

## 2015-05-17 ENCOUNTER — Other Ambulatory Visit: Payer: Self-pay | Admitting: Internal Medicine

## 2015-05-17 NOTE — Telephone Encounter (Signed)
Rx sent to the pharmacy by e-script.//AB/CMA 

## 2015-05-27 ENCOUNTER — Other Ambulatory Visit: Payer: Self-pay | Admitting: Internal Medicine

## 2015-05-27 NOTE — Telephone Encounter (Signed)
Refilled 07/2014. Patient seen 01/2015. Please advise?

## 2015-05-27 NOTE — Telephone Encounter (Signed)
Ok to refill until her next appt, but notify pt that she needs to keep her next appt.

## 2015-05-28 ENCOUNTER — Other Ambulatory Visit: Payer: Self-pay | Admitting: Internal Medicine

## 2015-06-05 DIAGNOSIS — H43813 Vitreous degeneration, bilateral: Secondary | ICD-10-CM | POA: Diagnosis not present

## 2015-06-05 DIAGNOSIS — H353110 Nonexudative age-related macular degeneration, right eye, stage unspecified: Secondary | ICD-10-CM | POA: Diagnosis not present

## 2015-06-05 DIAGNOSIS — Z79899 Other long term (current) drug therapy: Secondary | ICD-10-CM | POA: Diagnosis not present

## 2015-06-05 DIAGNOSIS — H353221 Exudative age-related macular degeneration, left eye, with active choroidal neovascularization: Secondary | ICD-10-CM | POA: Diagnosis not present

## 2015-06-05 DIAGNOSIS — Z7901 Long term (current) use of anticoagulants: Secondary | ICD-10-CM | POA: Diagnosis not present

## 2015-06-05 DIAGNOSIS — H2513 Age-related nuclear cataract, bilateral: Secondary | ICD-10-CM | POA: Diagnosis not present

## 2015-06-10 DIAGNOSIS — N184 Chronic kidney disease, stage 4 (severe): Secondary | ICD-10-CM | POA: Diagnosis not present

## 2015-06-10 DIAGNOSIS — Z7689 Persons encountering health services in other specified circumstances: Secondary | ICD-10-CM | POA: Diagnosis not present

## 2015-06-12 DIAGNOSIS — I129 Hypertensive chronic kidney disease with stage 1 through stage 4 chronic kidney disease, or unspecified chronic kidney disease: Secondary | ICD-10-CM | POA: Diagnosis not present

## 2015-06-12 DIAGNOSIS — E1122 Type 2 diabetes mellitus with diabetic chronic kidney disease: Secondary | ICD-10-CM | POA: Diagnosis not present

## 2015-06-12 DIAGNOSIS — R809 Proteinuria, unspecified: Secondary | ICD-10-CM | POA: Diagnosis not present

## 2015-06-12 DIAGNOSIS — E559 Vitamin D deficiency, unspecified: Secondary | ICD-10-CM | POA: Diagnosis not present

## 2015-06-12 DIAGNOSIS — Z905 Acquired absence of kidney: Secondary | ICD-10-CM | POA: Diagnosis not present

## 2015-06-14 DIAGNOSIS — I480 Paroxysmal atrial fibrillation: Secondary | ICD-10-CM | POA: Diagnosis not present

## 2015-06-18 ENCOUNTER — Encounter: Payer: Self-pay | Admitting: Internal Medicine

## 2015-06-20 NOTE — Telephone Encounter (Signed)
Patient called and stated that she is doing good. She has been excerising at the [facility] and wanted to take the yoga class with a friend. Patient is looking forward to seeing you at her visit on the 13th.

## 2015-06-26 DIAGNOSIS — Z905 Acquired absence of kidney: Secondary | ICD-10-CM | POA: Diagnosis not present

## 2015-06-26 DIAGNOSIS — E1122 Type 2 diabetes mellitus with diabetic chronic kidney disease: Secondary | ICD-10-CM | POA: Diagnosis not present

## 2015-06-26 DIAGNOSIS — E559 Vitamin D deficiency, unspecified: Secondary | ICD-10-CM | POA: Diagnosis not present

## 2015-06-26 DIAGNOSIS — I10 Essential (primary) hypertension: Secondary | ICD-10-CM | POA: Diagnosis not present

## 2015-06-26 DIAGNOSIS — N184 Chronic kidney disease, stage 4 (severe): Secondary | ICD-10-CM | POA: Diagnosis not present

## 2015-06-27 ENCOUNTER — Other Ambulatory Visit: Payer: Self-pay | Admitting: Internal Medicine

## 2015-07-02 ENCOUNTER — Encounter: Payer: Self-pay | Admitting: Internal Medicine

## 2015-07-02 ENCOUNTER — Ambulatory Visit (INDEPENDENT_AMBULATORY_CARE_PROVIDER_SITE_OTHER): Payer: Medicare Other | Admitting: Internal Medicine

## 2015-07-02 VITALS — BP 136/78 | HR 83 | Temp 98.7°F | Resp 18 | Ht 60.0 in | Wt 128.2 lb

## 2015-07-02 DIAGNOSIS — J4520 Mild intermittent asthma, uncomplicated: Secondary | ICD-10-CM | POA: Diagnosis not present

## 2015-07-02 DIAGNOSIS — Z1239 Encounter for other screening for malignant neoplasm of breast: Secondary | ICD-10-CM | POA: Diagnosis not present

## 2015-07-02 DIAGNOSIS — I10 Essential (primary) hypertension: Secondary | ICD-10-CM

## 2015-07-02 DIAGNOSIS — N184 Chronic kidney disease, stage 4 (severe): Secondary | ICD-10-CM

## 2015-07-02 DIAGNOSIS — I4891 Unspecified atrial fibrillation: Secondary | ICD-10-CM

## 2015-07-02 DIAGNOSIS — D751 Secondary polycythemia: Secondary | ICD-10-CM

## 2015-07-02 DIAGNOSIS — E1121 Type 2 diabetes mellitus with diabetic nephropathy: Secondary | ICD-10-CM

## 2015-07-02 DIAGNOSIS — D649 Anemia, unspecified: Secondary | ICD-10-CM

## 2015-07-02 DIAGNOSIS — E7800 Pure hypercholesterolemia, unspecified: Secondary | ICD-10-CM

## 2015-07-02 NOTE — Progress Notes (Signed)
Patient ID: Joan Erickson, female   DOB: December 30, 1931, 82 y.o.   MRN: [id number]   Subjective:    Patient ID: Joan Erickson, female    DOB: 1931-09-21, 82 y.o.   MRN: [id number]  HPI  Patient here for a scheduled follow up.  She has a history of CKD stage IV with solitary kidney.  Followed by nephrology. Due to f/u tomorrow.  She feels good.  Eating and drinking well.  Good appetite.  Not skipping meals.  Stress is better.  Feels better.  Blood sugars averaging 130s in the am.  blood pressure averaging 130s/60-70.  No chest pain.  No increased heart rate or palpitations.  No acid reflux.  No abdominal pain or cramping.  Bowels stable.  Is exercising.      Past Medical History  Diagnosis Date  . Hypertension   . Asthma   . GERD (gastroesophageal reflux disease)   . Hypercholesterolemia   . History of colon polyps   . Xanthogranulomatous pyelonephritis     s/p right nephrectomy  . Hypogammaglobulinemia (HCC)     IgA  . Atrial fibrillation [facility])    Past Surgical History  Procedure Laterality Date  . Abdominal hysterectomy      ovaries not removed  . Cesarean section    . Nephrectomy      right - for xanthogranulomatous pyelonephritis   Family History  Problem Relation Age of Onset  . Brain cancer Mother   . Liver cancer Father     questionabl colon cancer  . Breast cancer Neg Hx    Social History   Social History  . Marital Status: Married    Spouse Name: N/A  . Number of Children: 1  . Years of Education: N/A   Social History Main Topics  . Smoking status: Never Smoker   . Smokeless tobacco: Never Used  . Alcohol Use: No  . Drug Use: No  . Sexual Activity: Not Asked   Other Topics Concern  . None   Social History Narrative    Outpatient Encounter Prescriptions as of 07/02/2015  Medication Sig  . ADVAIR DISKUS 250-50 MCG/DOSE AEPB INHALE 1 PUFF BY MOUTH TWICE A DAY  . Blood Glucose Monitoring Suppl (ONE TOUCH ULTRA SYSTEM KIT) W/DEVICE KIT 1 kit by Does not apply  route once.  . chlorthalidone (HYGROTON) 25 MG tablet TAKE 1 TABLET (25 MG TOTAL) BY MOUTH DAILY. (Patient taking differently: TAKE 1/2  TABLET (12.5 MG TOTAL) BY MOUTH DAILY.)  . Cholecalciferol (VITAMIN D) 2000 UNITS tablet Take 2,000 Units by mouth daily.  . citalopram (CELEXA) 10 MG tablet TAKE 1 TABLET (10 MG TOTAL) BY MOUTH DAILY.  Marland Kitchen diltiazem (TIAZAC) 360 MG 24 hr capsule TAKE ONE CAPSULE BY MOUTH DAILY  . fluticasone (FLONASE) 50 MCG/ACT nasal spray USE 2 SPRAYS IN EACH NOSTRIL ONCE A DAY  . fluticasone (FLONASE) 50 MCG/ACT nasal spray USE 2 SPRAYS IN EACH NOSTRIL ONCE A DAY  . losartan (COZAAR) 100 MG tablet TAKE 1 TABLET BY MOUTH ONCE A DAY  . montelukast (SINGULAIR) 10 MG tablet TAKE 1 TABLET EVERY DAY  . ONE TOUCH ULTRA TEST test strip CHECK BLOOD SUGAR 2 TIMES A DAY DX: E11.9  . pravastatin (PRAVACHOL) 20 MG tablet TAKE 1 TABLET (20 MG TOTAL) BY MOUTH DAILY.  Marland Kitchen PROAIR HFA 108 (90 Base) MCG/ACT inhaler INHALE 2 PUFF EVERY SIX HOURS AS NEEDED  . TRADJENTA 5 MG TABS tablet TAKE 1 TABLET (5 MG TOTAL) BY MOUTH DAILY.  Marland Kitchen  warfarin (COUMADIN) 1 MG tablet Take 1 mg by mouth daily. Take 1/2 tablet as directed  . warfarin (COUMADIN) 5 MG tablet As directed  . [DISCONTINUED] chlorthalidone (HYGROTON) 25 MG tablet TAKE 1 TABLET (25 MG TOTAL) BY MOUTH DAILY.   No facility-administered encounter medications on file as of 07/02/2015.    Review of Systems  Constitutional: Negative for appetite change and unexpected weight change.  HENT: Negative for congestion and sinus pressure.   Respiratory: Negative for cough, chest tightness and shortness of breath.   Cardiovascular: Negative for chest pain, palpitations and leg swelling.  Gastrointestinal: Negative for nausea, vomiting, abdominal pain and diarrhea.  Genitourinary: Negative for dysuria and difficulty urinating.  Musculoskeletal: Negative for back pain and joint swelling.  Skin: Negative for color change and rash.  Neurological: Negative  for dizziness, light-headedness and headaches.  Psychiatric/Behavioral: Negative for dysphoric mood and agitation.       Objective:     Blood pressure rechecked by me:  138-142/72  Physical Exam  Constitutional: She appears well-developed and well-nourished. No distress.  HENT:  Nose: Nose normal.  Mouth/Throat: Oropharynx is clear and moist.  Neck: Neck supple. No thyromegaly present.  Cardiovascular: Normal rate and regular rhythm.   DP pulses palpable and equal bilaterally.   Pulmonary/Chest: Breath sounds normal. No respiratory distress. She has no wheezes.  Abdominal: Soft. Bowel sounds are normal. There is no tenderness.  Musculoskeletal: She exhibits no edema or tenderness.  Lymphadenopathy:    She has no cervical adenopathy.  Skin: No rash noted. No erythema.  Psychiatric: She has a normal mood and affect. Her behavior is normal.    BP 136/78 mmHg  Pulse 83  Temp(Src) 98.7 F (37.1 C) (Oral)  Resp 18  Ht 5' (1.524 m)  Wt 128 lb 4 oz (58.174 kg)  BMI 25.05 kg/m2  SpO2 96% Wt Readings from Last 3 Encounters:  07/02/15 128 lb 4 oz (58.174 kg)  01/24/15 127 lb (57.607 kg)  11/29/14 118 lb (53.524 kg)     Lab Results  Component Value Date   WBC 7.3 02/01/2015   HGB 10.1* 02/01/2015   HCT 31.1* 02/01/2015   PLT 297.0 02/01/2015   GLUCOSE 107* 02/01/2015   CHOL 168 02/01/2015   TRIG 56.0 02/01/2015   HDL 65.00 02/01/2015   LDLCALC 92 02/01/2015   ALT 15 02/01/2015   AST 23 02/01/2015   NA 133* 02/01/2015   K 4.8 02/01/2015   CL 103 02/01/2015   CREATININE 2.75* 02/01/2015   BUN 59* 02/01/2015   CO2 25 02/01/2015   TSH 2.03 11/14/2014   INR 2.44 03/04/2015   HGBA1C 5.5 02/01/2015       Assessment & Plan:   Problem List Items Addressed This Visit    Anemia    Follow cbc.       Relevant Orders   CBC with Differential/Platelet   Ferritin   Asthma    Breathing stable on current medication regimen.  Follow.        Atrial fibrillation ([place])      On coumadin.  PT/INR followed by cardiology.  Appears to be in SR.  Continue f/u with cardiology.       CKD (chronic kidney disease) stage 4, GFR 15-29 ml/min (HCC)    Followed by nephrology.  Has f/u tomorrow.        Hypercholesterolemia    On pravastatin.  Low cholesterol diet and exercise.  Follow lipid panel and liver function tests.  Relevant Orders   Lipid panel   Hepatic function panel   Hypertension    Blood pressure as outlined.  Same medication regimen.  Follow pressures.  Follow metabolic panel.       Relevant Orders   TSH   Polycythemia    Follow cbc.       Type II diabetes mellitus with nephropathy (HCC)    Continue low carb diet and exercise.  Follow met b and a1c.       Relevant Orders   Hemoglobin A1c    Other Visit Diagnoses    Screening breast examination    -  Primary    Relevant Orders    MM DIGITAL SCREENING BILATERAL        Einar Pheasant, MD

## 2015-07-02 NOTE — Progress Notes (Signed)
Pre-visit discussion using our clinic review tool. No additional management support is needed unless otherwise documented below in the visit note.  

## 2015-07-03 ENCOUNTER — Encounter: Payer: Self-pay | Admitting: Internal Medicine

## 2015-07-03 DIAGNOSIS — E559 Vitamin D deficiency, unspecified: Secondary | ICD-10-CM | POA: Diagnosis not present

## 2015-07-03 DIAGNOSIS — D45 Polycythemia vera: Secondary | ICD-10-CM | POA: Diagnosis not present

## 2015-07-03 DIAGNOSIS — E1122 Type 2 diabetes mellitus with diabetic chronic kidney disease: Secondary | ICD-10-CM | POA: Diagnosis not present

## 2015-07-03 DIAGNOSIS — E782 Mixed hyperlipidemia: Secondary | ICD-10-CM | POA: Diagnosis not present

## 2015-07-03 DIAGNOSIS — Z905 Acquired absence of kidney: Secondary | ICD-10-CM | POA: Diagnosis not present

## 2015-07-03 NOTE — Assessment & Plan Note (Signed)
Follow cbc.  

## 2015-07-03 NOTE — Assessment & Plan Note (Signed)
Followed by nephrology.  Has f/u tomorrow.

## 2015-07-03 NOTE — Assessment & Plan Note (Signed)
On pravastatin.  Low cholesterol diet and exercise.  Follow lipid panel and liver function tests.   

## 2015-07-03 NOTE — Assessment & Plan Note (Signed)
Blood pressure as outlined.  Same medication regimen.  Follow pressures.  Follow metabolic panel.  

## 2015-07-03 NOTE — Assessment & Plan Note (Signed)
Breathing stable on current medication regimen.  Follow.

## 2015-07-03 NOTE — Assessment & Plan Note (Signed)
Continue low carb diet and exercise.  Follow met b and a1c.  

## 2015-07-03 NOTE — Assessment & Plan Note (Signed)
On coumadin.  PT/INR followed by cardiology.  Appears to be in SR.  Continue f/u with cardiology.

## 2015-07-04 ENCOUNTER — Telehealth: Payer: Self-pay | Admitting: Internal Medicine

## 2015-07-04 NOTE — Telephone Encounter (Signed)
Spoke with the patient.  She went and saw her kidney doctor and was told to call you to discuss results with you.  She said you can call anytime that works for you.  Thanks

## 2015-07-04 NOTE — Telephone Encounter (Signed)
Pt lvm stating she wants Dr. Nicki Reaper to please call her. She did not state what it was regarding.

## 2015-07-06 NOTE — Telephone Encounter (Signed)
Called and spoke to pt.  She just wanted to update me with her appt.  Kidney function has decreased more.  They are following.

## 2015-07-10 DIAGNOSIS — I480 Paroxysmal atrial fibrillation: Secondary | ICD-10-CM | POA: Diagnosis not present

## 2015-07-17 ENCOUNTER — Ambulatory Visit: Payer: Medicare Other

## 2015-07-17 ENCOUNTER — Other Ambulatory Visit: Payer: Self-pay | Admitting: Internal Medicine

## 2015-07-18 ENCOUNTER — Other Ambulatory Visit: Payer: Self-pay | Admitting: Internal Medicine

## 2015-07-18 ENCOUNTER — Ambulatory Visit
Admission: RE | Admit: 2015-07-18 | Discharge: 2015-07-18 | Disposition: A | Discharge status: Home / Self Care | Payer: Medicare Other | Source: Ambulatory Visit | Attending: Internal Medicine | Admitting: Internal Medicine

## 2015-07-18 DIAGNOSIS — Z1239 Encounter for other screening for malignant neoplasm of breast: Secondary | ICD-10-CM

## 2015-07-18 DIAGNOSIS — Z1231 Encounter for screening mammogram for malignant neoplasm of breast: Secondary | ICD-10-CM | POA: Insufficient documentation

## 2015-07-19 ENCOUNTER — Other Ambulatory Visit: Payer: Self-pay | Admitting: Internal Medicine

## 2015-07-19 ENCOUNTER — Encounter: Payer: Self-pay | Admitting: Internal Medicine

## 2015-07-19 DIAGNOSIS — R928 Other abnormal and inconclusive findings on diagnostic imaging of breast: Secondary | ICD-10-CM

## 2015-07-19 NOTE — Progress Notes (Signed)
Order placed for f/u right breast mammogram and ultrasound  

## 2015-07-24 ENCOUNTER — Other Ambulatory Visit: Payer: Self-pay | Admitting: Internal Medicine

## 2015-07-24 NOTE — Telephone Encounter (Signed)
Unread mychart message mailed to patient 

## 2015-08-01 ENCOUNTER — Other Ambulatory Visit: Payer: Self-pay

## 2015-08-01 MED ORDER — CITALOPRAM HYDROBROMIDE 10 MG PO TABS: ORAL_TABLET | ORAL | Status: DC

## 2015-08-06 ENCOUNTER — Other Ambulatory Visit: Payer: Medicare Other

## 2015-08-06 ENCOUNTER — Ambulatory Visit: Payer: Medicare Other | Attending: Internal Medicine

## 2015-08-12 ENCOUNTER — Other Ambulatory Visit: Payer: Self-pay | Admitting: Internal Medicine

## 2015-08-19 ENCOUNTER — Ambulatory Visit: Payer: Medicare Other

## 2015-08-19 ENCOUNTER — Other Ambulatory Visit: Payer: Medicare Other

## 2015-08-28 ENCOUNTER — Other Ambulatory Visit: Payer: Self-pay | Admitting: Internal Medicine

## 2015-08-29 DIAGNOSIS — I480 Paroxysmal atrial fibrillation: Secondary | ICD-10-CM | POA: Diagnosis not present

## 2015-08-29 DIAGNOSIS — N184 Chronic kidney disease, stage 4 (severe): Secondary | ICD-10-CM | POA: Diagnosis not present

## 2015-08-29 NOTE — Telephone Encounter (Signed)
This was sent in July 2016 with #30 and 1 refill.

## 2015-08-30 ENCOUNTER — Ambulatory Visit
Admission: RE | Admit: 2015-08-30 | Discharge: 2015-08-30 | Disposition: A | Discharge status: Home / Self Care | Payer: Medicare Other | Source: Ambulatory Visit | Attending: Internal Medicine | Admitting: Internal Medicine

## 2015-08-30 ENCOUNTER — Other Ambulatory Visit: Payer: Self-pay | Admitting: Internal Medicine

## 2015-08-30 DIAGNOSIS — R928 Other abnormal and inconclusive findings on diagnostic imaging of breast: Secondary | ICD-10-CM | POA: Diagnosis not present

## 2015-08-30 NOTE — Telephone Encounter (Signed)
Per record review, what I could find was that nephrology had previously decreased this to 1/2 tablet q day.  Need to clarify with her if she is still taking.  Also, need to clarify with pharmacy if nephrology has been refilling.    Dr Nicki Reaper

## 2015-09-03 NOTE — Telephone Encounter (Signed)
Per patient & pharmacy, you have been the one to refill this medication. Pt states that nephrology did decrease to 1/2 tablet daily, but you were to continue to refill the medication for her.

## 2015-09-04 NOTE — Telephone Encounter (Signed)
Pt states that she has been taking 1/2 daily as directed.

## 2015-09-04 NOTE — Telephone Encounter (Signed)
If I have been the one refilling, then it appears she has not been taking.  In my review of chart the last time we refilled the medication was 07/2014 for #30 with one refill.  This is over one year ago.  Just needed to clarify if pt has been taking regularly.  If so, then ok to refill with directions of 1/2 tablet q day.  Pt needs to make sure she has been taking this regularly

## 2015-09-05 DIAGNOSIS — N185 Chronic kidney disease, stage 5: Secondary | ICD-10-CM | POA: Diagnosis not present

## 2015-09-11 DIAGNOSIS — H353110 Nonexudative age-related macular degeneration, right eye, stage unspecified: Secondary | ICD-10-CM | POA: Diagnosis not present

## 2015-09-11 DIAGNOSIS — H2513 Age-related nuclear cataract, bilateral: Secondary | ICD-10-CM | POA: Diagnosis not present

## 2015-09-11 DIAGNOSIS — H353221 Exudative age-related macular degeneration, left eye, with active choroidal neovascularization: Secondary | ICD-10-CM | POA: Diagnosis not present

## 2015-09-11 DIAGNOSIS — H43813 Vitreous degeneration, bilateral: Secondary | ICD-10-CM | POA: Diagnosis not present

## 2015-09-19 DIAGNOSIS — E7800 Pure hypercholesterolemia, unspecified: Secondary | ICD-10-CM | POA: Diagnosis not present

## 2015-09-19 DIAGNOSIS — I10 Essential (primary) hypertension: Secondary | ICD-10-CM | POA: Diagnosis not present

## 2015-09-19 DIAGNOSIS — I480 Paroxysmal atrial fibrillation: Secondary | ICD-10-CM | POA: Diagnosis not present

## 2015-09-25 ENCOUNTER — Other Ambulatory Visit: Payer: Self-pay | Admitting: Internal Medicine

## 2015-09-27 ENCOUNTER — Other Ambulatory Visit: Payer: Self-pay | Admitting: Internal Medicine

## 2015-09-28 ENCOUNTER — Other Ambulatory Visit: Payer: Self-pay | Admitting: Internal Medicine

## 2015-10-08 ENCOUNTER — Encounter: Payer: Self-pay | Admitting: Internal Medicine

## 2015-10-08 ENCOUNTER — Ambulatory Visit (INDEPENDENT_AMBULATORY_CARE_PROVIDER_SITE_OTHER): Payer: Medicare Other | Admitting: Internal Medicine

## 2015-10-08 VITALS — BP 148/60 | HR 91 | Temp 98.3°F | Ht 59.0 in | Wt 127.8 lb

## 2015-10-08 DIAGNOSIS — I4891 Unspecified atrial fibrillation: Secondary | ICD-10-CM

## 2015-10-08 DIAGNOSIS — Z0000 Encounter for general adult medical examination without abnormal findings: Secondary | ICD-10-CM

## 2015-10-08 DIAGNOSIS — Z860100 Personal history of colon polyps, unspecified: Secondary | ICD-10-CM

## 2015-10-08 DIAGNOSIS — J4520 Mild intermittent asthma, uncomplicated: Secondary | ICD-10-CM

## 2015-10-08 DIAGNOSIS — Z23 Encounter for immunization: Secondary | ICD-10-CM | POA: Diagnosis not present

## 2015-10-08 DIAGNOSIS — E010 Iodine-deficiency related diffuse (endemic) goiter: Secondary | ICD-10-CM

## 2015-10-08 DIAGNOSIS — N184 Chronic kidney disease, stage 4 (severe): Secondary | ICD-10-CM

## 2015-10-08 DIAGNOSIS — D751 Secondary polycythemia: Secondary | ICD-10-CM | POA: Diagnosis not present

## 2015-10-08 DIAGNOSIS — E7800 Pure hypercholesterolemia, unspecified: Secondary | ICD-10-CM

## 2015-10-08 DIAGNOSIS — E049 Nontoxic goiter, unspecified: Secondary | ICD-10-CM | POA: Diagnosis not present

## 2015-10-08 DIAGNOSIS — I10 Essential (primary) hypertension: Secondary | ICD-10-CM

## 2015-10-08 DIAGNOSIS — D649 Anemia, unspecified: Secondary | ICD-10-CM

## 2015-10-08 DIAGNOSIS — E1121 Type 2 diabetes mellitus with diabetic nephropathy: Secondary | ICD-10-CM

## 2015-10-08 NOTE — Assessment & Plan Note (Signed)
Physical today 10/08/15.  Mammogram 07/18/15 as outlined.  F/u right breast mammogram 08/30/15 - Birads I.  Recommended yearly follow up.  Colonoscopy 10/31/12 - recommended f/u colonoscopy in 3 years.

## 2015-10-08 NOTE — Progress Notes (Signed)
Patient ID: Joan Erickson, female   DOB: 01-Aug-1931, 82 y.o.   MRN: [id number]   Subjective:    Patient ID: Joan Erickson, female    DOB: 30-Aug-1931, 82 y.o.   MRN: [id number]  HPI  Patient here for her physical exam.  Just evaluated by cardiology 09/19/15.  Has a history of paroxysmal afib.  Rate controlled with diltiazem and digoxin.  Currently in SR.  She also has CKD stage IV/V.  Seeing nephrology.  They have discussed dialysis.  Also have discussed transplant.  She is doing well.  Feels good.  No chest pain.  Is exercising at the [facility] daily.  No acid reflux.  No abdominal pain or cramping.  Bowels stable.  Sugars averaging 134-136 in the am.  Blood pressures averaging <160 systolic readings.     Past Medical History:  Diagnosis Date  . Asthma   . Atrial fibrillation ([place])   . GERD (gastroesophageal reflux disease)   . History of colon polyps   . Hypercholesterolemia   . Hypertension   . Hypogammaglobulinemia (HCC)    IgA  . Xanthogranulomatous pyelonephritis    s/p right nephrectomy   Past Surgical History:  Procedure Laterality Date  . ABDOMINAL HYSTERECTOMY     ovaries not removed  . CESAREAN SECTION    . NEPHRECTOMY     right - for xanthogranulomatous pyelonephritis   Family History  Problem Relation Age of Onset  . Brain cancer Mother   . Liver cancer Father     questionabl colon cancer  . Breast cancer Neg Hx    Social History   Social History  . Marital status: Married    Spouse name: N/A  . Number of children: 1  . Years of education: N/A   Social History Main Topics  . Smoking status: Never Smoker  . Smokeless tobacco: Never Used  . Alcohol use No  . Drug use: No  . Sexual activity: Not Asked   Other Topics Concern  . None   Social History Narrative  . None    Outpatient Encounter Prescriptions as of 10/08/2015  Medication Sig  . ADVAIR DISKUS 250-50 MCG/DOSE AEPB INHALE 1 PUFF BY MOUTH TWICE A DAY  . Blood Glucose Monitoring Suppl (ONE TOUCH  ULTRA SYSTEM KIT) W/DEVICE KIT 1 kit by Does not apply route once.  . chlorthalidone (HYGROTON) 25 MG tablet Take 0.5 tablets (12.5 mg total) by mouth daily.  . Cholecalciferol (VITAMIN D) 2000 UNITS tablet Take 2,000 Units by mouth daily.  . citalopram (CELEXA) 10 MG tablet TAKE 1 TABLET (10 MG TOTAL) BY MOUTH DAILY.  Marland Kitchen diltiazem (TIAZAC) 360 MG 24 hr capsule TAKE ONE CAPSULE BY MOUTH DAILY  . fluticasone (FLONASE) 50 MCG/ACT nasal spray USE 2 SPRAYS IN EACH NOSTRIL ONCE A DAY  . fluticasone (FLONASE) 50 MCG/ACT nasal spray USE 2 SPRAYS IN EACH NOSTRIL ONCE A DAY  . fluticasone (FLONASE) 50 MCG/ACT nasal spray USE 2 SPRAYS IN EACH NOSTRIL ONCE A DAY  . losartan (COZAAR) 100 MG tablet TAKE 1 TABLET BY MOUTH ONCE A DAY  . montelukast (SINGULAIR) 10 MG tablet TAKE 1 TABLET EVERY DAY  . ONE TOUCH ULTRA TEST test strip CHECK BLOOD SUGAR 2 TIMES A DAY DX: E11.9  . pravastatin (PRAVACHOL) 20 MG tablet TAKE 1 TABLET (20 MG TOTAL) BY MOUTH DAILY.  Marland Kitchen PROAIR HFA 108 (90 Base) MCG/ACT inhaler INHALE 2 PUFF EVERY SIX HOURS AS NEEDED  . TRADJENTA 5 MG TABS tablet TAKE 1 TABLET (  5 MG TOTAL) BY MOUTH DAILY.  Marland Kitchen warfarin (COUMADIN) 1 MG tablet Take 1 mg by mouth daily. Take 1/2 tablet as directed  . warfarin (COUMADIN) 5 MG tablet As directed  . eplerenone (INSPRA) 25 MG tablet TAKE 1 TABLET (25 MG TOTAL) BY MOUTH ONCE DAILY FOR 14 DAYS.   No facility-administered encounter medications on file as of 10/08/2015.     Review of Systems  Constitutional: Negative for appetite change and unexpected weight change.  HENT: Negative for congestion, sinus pressure and sore throat.   Eyes: Negative for pain and visual disturbance.  Respiratory: Negative for cough, chest tightness and shortness of breath.   Cardiovascular: Negative for chest pain, palpitations and leg swelling.  Gastrointestinal: Negative for abdominal pain, diarrhea, nausea and vomiting.  Genitourinary: Negative for difficulty urinating and frequency.   Musculoskeletal: Negative for back pain and joint swelling.  Skin: Negative for color change and rash.  Neurological: Negative for dizziness and headaches.  Hematological: Negative for adenopathy. Does not bruise/bleed easily.  Psychiatric/Behavioral: Negative for agitation and dysphoric mood.       Objective:    Physical Exam  Constitutional: She appears well-developed and well-nourished. No distress.  HENT:  Nose: Nose normal.  Mouth/Throat: Oropharynx is clear and moist.  Eyes: Conjunctivae are normal. Right eye exhibits no discharge. Left eye exhibits no discharge.  Neck: Neck supple. No thyromegaly present.  Cardiovascular: Normal rate and regular rhythm.   Pulmonary/Chest: Breath sounds normal. No respiratory distress. She has no wheezes.  Breast exam - no nipple discharge.  Nipple inverted - left.  Unchanged.  Could not appreciate any distinct nodules or axillary adenopathy.    Abdominal: Soft. Bowel sounds are normal. There is no tenderness.  Musculoskeletal: She exhibits no edema or tenderness.  Lymphadenopathy:    She has no cervical adenopathy.  Skin: No rash noted. No erythema.  Psychiatric: She has a normal mood and affect. Her behavior is normal.    BP (!) 148/60   Pulse 91   Temp 98.3 F (36.8 C) (Oral)   Ht _0  (1.499 m)   Wt 127 lb 12.8 oz (58 kg)   SpO2 96%   BMI 25.81 kg/m  Wt Readings from Last 3 Encounters:  10/08/15 127 lb 12.8 oz (58 kg)  07/02/15 128 lb 4 oz (58.2 kg)  01/24/15 127 lb (57.6 kg)     Lab Results  Component Value Date   WBC 7.3 02/01/2015   HGB 10.1 (L) 02/01/2015   HCT 31.1 (L) 02/01/2015   PLT 297.0 02/01/2015   GLUCOSE 107 (H) 02/01/2015   CHOL 168 02/01/2015   TRIG 56.0 02/01/2015   HDL 65.00 02/01/2015   LDLCALC 92 02/01/2015   ALT 15 02/01/2015   AST 23 02/01/2015   NA 133 (L) 02/01/2015   K 4.8 02/01/2015   CL 103 02/01/2015   CREATININE 2.75 (H) 02/01/2015   BUN 59 (H) 02/01/2015   CO2 25 02/01/2015    TSH 2.03 11/14/2014   INR 2.44 03/04/2015   HGBA1C 5.5 02/01/2015    Mm Diag Breast Tomo Uni Right  Result Date: 08/30/2015 CLINICAL DATA:  Screening recall for possible right breast mass. EXAM: 2D DIGITAL DIAGNOSTIC UNILATERAL RIGHT MAMMOGRAM WITH CAD AND ADJUNCT TOMO COMPARISON:  Previous exam(s). ACR Breast Density Category c: The breast tissue is heterogeneously dense, which may obscure small masses. FINDINGS: Spot compression CC and MLO tomograms were performed of the right breast. The initially questioned possible right breast mass resolves on the  additional imaging with findings compatible with overlapping fibroglandular tissue. There is no mammographic evidence of malignancy in the right breast. Mammographic images were processed with CAD. IMPRESSION: No mammographic evidence of malignancy in the right breast. RECOMMENDATION: Screening mammogram in one year.(Code:[id number]-01Y) I have discussed the findings and recommendations with the patient. Results were also provided in writing at the conclusion of the visit. If applicable, a reminder letter will be sent to the patient regarding the next appointment. BI-RADS CATEGORY  1: Negative. Electronically Signed   By: Everlean Alstrom M.D.   On: 08/30/2015 15:17       Assessment & Plan:   Problem List Items Addressed This Visit    Anemia (Chronic)    Follow cbc.       Asthma    Breathing stable.        Atrial fibrillation ([employer])    On coumadin.  Appears to be in SR.        Relevant Medications   eplerenone (INSPRA) 25 MG tablet   CKD (chronic kidney disease) stage 4, GFR 15-29 ml/min (HCC)    Followed by nephrology.        Health care maintenance    Physical today 10/08/15.  Mammogram 07/18/15 as outlined.  F/u right breast mammogram 08/30/15 - Birads I.  Recommended yearly follow up.  Colonoscopy 10/31/12 - recommended f/u colonoscopy in 3 years.        History of colonic polyps    Colonoscopy 10/31/12 - as outlined.  Recommended f/u  colonoscopy in three years.        Hypercholesterolemia    On pravastatin.  Low cholesterol diet and exercise. Follow lipid panel and liver function tests.        Relevant Medications   eplerenone (INSPRA) 25 MG tablet   Hypertension    Blood pressure overall has been doing better.  Same medication regimen.  Follow pressures.  Follow metabolic panel.       Relevant Medications   eplerenone (INSPRA) 25 MG tablet   Polycythemia    Follow cbc.       Thyromegaly    Has seen Dr Eddie Dibbles.  Biopsy negative.  Had CT previously.  Has desired to continue to monitor.        Type II diabetes mellitus with nephropathy (HCC)    Low carb diet and exercise.  Sugars have been doing better.  Follow met b and a1c.  Up to date with eye exams.         Other Visit Diagnoses    Encounter for immunization       Relevant Orders   Flu vaccine HIGH DOSE PF (Completed)       Einar Pheasant, MD

## 2015-10-08 NOTE — Progress Notes (Signed)
Pre visit review using our clinic review tool, if applicable. No additional management support is needed unless otherwise documented below in the visit note. 

## 2015-10-13 ENCOUNTER — Encounter: Payer: Self-pay | Admitting: Internal Medicine

## 2015-10-13 NOTE — Assessment & Plan Note (Signed)
On coumadin.  Appears to be in SR.   

## 2015-10-13 NOTE — Assessment & Plan Note (Signed)
Breathing stable.

## 2015-10-13 NOTE — Assessment & Plan Note (Signed)
On pravastatin.  Low cholesterol diet and exercise.  Follow lipid panel and liver function tests.   

## 2015-10-13 NOTE — Assessment & Plan Note (Signed)
Low carb diet and exercise.  Sugars have been doing better.  Follow met b and a1c.  Up to date with eye exams.

## 2015-10-13 NOTE — Assessment & Plan Note (Signed)
Blood pressure overall has been doing better.  Same medication regimen.  Follow pressures.  Follow metabolic panel.

## 2015-10-13 NOTE — Assessment & Plan Note (Signed)
Follow cbc.  

## 2015-10-13 NOTE — Assessment & Plan Note (Signed)
Has seen Dr Eddie Dibbles.  Biopsy negative.  Had CT previously.  Has desired to continue to monitor.

## 2015-10-13 NOTE — Assessment & Plan Note (Signed)
Colonoscopy 10/31/12 - as outlined.  Recommended f/u colonoscopy in three years.

## 2015-10-13 NOTE — Assessment & Plan Note (Signed)
Followed by nephrology. 

## 2015-10-14 DIAGNOSIS — N184 Chronic kidney disease, stage 4 (severe): Secondary | ICD-10-CM | POA: Diagnosis not present

## 2015-10-16 DIAGNOSIS — H43813 Vitreous degeneration, bilateral: Secondary | ICD-10-CM | POA: Diagnosis not present

## 2015-10-16 DIAGNOSIS — H353221 Exudative age-related macular degeneration, left eye, with active choroidal neovascularization: Secondary | ICD-10-CM | POA: Diagnosis not present

## 2015-10-16 DIAGNOSIS — H2513 Age-related nuclear cataract, bilateral: Secondary | ICD-10-CM | POA: Diagnosis not present

## 2015-10-16 DIAGNOSIS — H353220 Exudative age-related macular degeneration, left eye, stage unspecified: Secondary | ICD-10-CM | POA: Diagnosis not present

## 2015-10-16 DIAGNOSIS — H353110 Nonexudative age-related macular degeneration, right eye, stage unspecified: Secondary | ICD-10-CM | POA: Diagnosis not present

## 2015-10-23 ENCOUNTER — Other Ambulatory Visit: Payer: Self-pay | Admitting: Internal Medicine

## 2015-10-31 DIAGNOSIS — I129 Hypertensive chronic kidney disease with stage 1 through stage 4 chronic kidney disease, or unspecified chronic kidney disease: Secondary | ICD-10-CM | POA: Diagnosis not present

## 2015-10-31 DIAGNOSIS — E782 Mixed hyperlipidemia: Secondary | ICD-10-CM | POA: Diagnosis not present

## 2015-10-31 DIAGNOSIS — E1122 Type 2 diabetes mellitus with diabetic chronic kidney disease: Secondary | ICD-10-CM | POA: Diagnosis not present

## 2015-10-31 DIAGNOSIS — N184 Chronic kidney disease, stage 4 (severe): Secondary | ICD-10-CM | POA: Diagnosis not present

## 2015-10-31 DIAGNOSIS — E559 Vitamin D deficiency, unspecified: Secondary | ICD-10-CM | POA: Diagnosis not present

## 2015-10-31 DIAGNOSIS — D45 Polycythemia vera: Secondary | ICD-10-CM | POA: Diagnosis not present

## 2015-11-05 DIAGNOSIS — I480 Paroxysmal atrial fibrillation: Secondary | ICD-10-CM | POA: Diagnosis not present

## 2015-11-06 DIAGNOSIS — H353232 Exudative age-related macular degeneration, bilateral, with inactive choroidal neovascularization: Secondary | ICD-10-CM | POA: Diagnosis not present

## 2015-11-13 DIAGNOSIS — H353110 Nonexudative age-related macular degeneration, right eye, stage unspecified: Secondary | ICD-10-CM | POA: Diagnosis not present

## 2015-11-13 DIAGNOSIS — H2513 Age-related nuclear cataract, bilateral: Secondary | ICD-10-CM | POA: Diagnosis not present

## 2015-11-13 DIAGNOSIS — H43813 Vitreous degeneration, bilateral: Secondary | ICD-10-CM | POA: Diagnosis not present

## 2015-11-13 DIAGNOSIS — H353221 Exudative age-related macular degeneration, left eye, with active choroidal neovascularization: Secondary | ICD-10-CM | POA: Diagnosis not present

## 2015-11-15 ENCOUNTER — Telehealth: Payer: Self-pay | Admitting: Internal Medicine

## 2015-11-15 NOTE — Telephone Encounter (Signed)
Please advise 

## 2015-11-15 NOTE — Telephone Encounter (Signed)
Please call pt and get more information.  Make sure not emergent.

## 2015-11-15 NOTE — Telephone Encounter (Signed)
Patient states she received a letter from GI doctor saying that since she is over 4 you do not need anymore colonoscopys is this ok?  Kidney doctor turned name in for kidney transplant and she is going to [facility] for a class on this next Tuesday.Patient has not had labs done.

## 2015-11-15 NOTE — Telephone Encounter (Signed)
Pt lvm stating that Dr. Nicki Reaper wanted her to call. She did not elaborate on why. This is all she said on the VM.

## 2015-11-17 NOTE — Telephone Encounter (Signed)
Called pt and discussed issues.  She is planning to go to [facility] on Tuesday.  Will hold on colonoscopy until see plan about kidney.

## 2015-11-19 DIAGNOSIS — N186 End stage renal disease: Secondary | ICD-10-CM | POA: Diagnosis not present

## 2015-11-19 DIAGNOSIS — Z87448 Personal history of other diseases of urinary system: Secondary | ICD-10-CM | POA: Diagnosis not present

## 2015-11-19 DIAGNOSIS — J45909 Unspecified asthma, uncomplicated: Secondary | ICD-10-CM | POA: Diagnosis not present

## 2015-11-19 DIAGNOSIS — E1122 Type 2 diabetes mellitus with diabetic chronic kidney disease: Secondary | ICD-10-CM | POA: Diagnosis not present

## 2015-11-19 DIAGNOSIS — Z905 Acquired absence of kidney: Secondary | ICD-10-CM | POA: Diagnosis not present

## 2015-11-19 DIAGNOSIS — E785 Hyperlipidemia, unspecified: Secondary | ICD-10-CM | POA: Diagnosis not present

## 2015-11-19 DIAGNOSIS — Z01818 Encounter for other preprocedural examination: Secondary | ICD-10-CM | POA: Diagnosis not present

## 2015-11-19 DIAGNOSIS — I4891 Unspecified atrial fibrillation: Secondary | ICD-10-CM | POA: Diagnosis not present

## 2015-11-19 DIAGNOSIS — I120 Hypertensive chronic kidney disease with stage 5 chronic kidney disease or end stage renal disease: Secondary | ICD-10-CM | POA: Diagnosis not present

## 2015-11-22 ENCOUNTER — Other Ambulatory Visit: Payer: Self-pay

## 2015-11-22 MED ORDER — MONTELUKAST SODIUM 10 MG PO TABS: 10.0000 mg | ORAL_TABLET | Freq: Every day | ORAL | 1 refills | Status: AC

## 2015-11-22 MED ORDER — DILTIAZEM HCL ER BEADS 360 MG PO CP24: 360.0000 mg | ORAL_CAPSULE | Freq: Every day | ORAL | 1 refills | Status: DC

## 2015-11-22 MED ORDER — CITALOPRAM HYDROBROMIDE 10 MG PO TABS: 10.0000 mg | ORAL_TABLET | Freq: Every day | ORAL | 1 refills | Status: DC

## 2015-12-03 DIAGNOSIS — I480 Paroxysmal atrial fibrillation: Secondary | ICD-10-CM | POA: Diagnosis not present

## 2015-12-17 DIAGNOSIS — N184 Chronic kidney disease, stage 4 (severe): Secondary | ICD-10-CM | POA: Diagnosis not present

## 2015-12-18 DIAGNOSIS — Z905 Acquired absence of kidney: Secondary | ICD-10-CM | POA: Diagnosis not present

## 2015-12-18 DIAGNOSIS — E559 Vitamin D deficiency, unspecified: Secondary | ICD-10-CM | POA: Diagnosis not present

## 2015-12-18 DIAGNOSIS — E1122 Type 2 diabetes mellitus with diabetic chronic kidney disease: Secondary | ICD-10-CM | POA: Diagnosis not present

## 2015-12-18 DIAGNOSIS — N184 Chronic kidney disease, stage 4 (severe): Secondary | ICD-10-CM | POA: Diagnosis not present

## 2015-12-18 DIAGNOSIS — I10 Essential (primary) hypertension: Secondary | ICD-10-CM | POA: Diagnosis not present

## 2015-12-23 ENCOUNTER — Other Ambulatory Visit: Payer: Self-pay | Admitting: Internal Medicine

## 2015-12-25 ENCOUNTER — Telehealth: Payer: Self-pay | Admitting: *Deleted

## 2015-12-25 ENCOUNTER — Encounter: Payer: Self-pay | Admitting: Internal Medicine

## 2015-12-25 NOTE — Telephone Encounter (Signed)
Pt requested a call from Dr. Nicki Reaper in reference to medication, she stated that she sent a my chart message  Pt contact [phone]

## 2015-12-25 NOTE — Telephone Encounter (Signed)
Spoke to patient states she has been having cough for two weeks on going. Not been using OTC meds.   She stated to me she has history atrial fibrillation and asthma Doesn't feel like her heart is fluttering.  She also stated that she was out of albuterol for a couple of days and just recently got it refilled , now shortness of breath has improved some.  Appointment scheduled for tomorrow with Joycelyn Schmid , FNP to further evaluate symptoms.  Advised patient against exercising until symptoms are evaluated since having shortness of breathe.  Patient will precede to ER if symptoms worsen.

## 2015-12-25 NOTE — Telephone Encounter (Signed)
Patients call returned see documentation via patient email.

## 2015-12-25 NOTE — Telephone Encounter (Signed)
Returning your call. °

## 2015-12-25 NOTE — Telephone Encounter (Signed)
Called patient back and advised to be evaluated at urgent care.  Patient verbalized understanding.

## 2015-12-25 NOTE — Telephone Encounter (Signed)
Please call and inform Joan Erickson that I am not in the office today, but given these symptoms and her history - she needs to be seen today.  Since no appts here - acute care or [facility].

## 2015-12-26 ENCOUNTER — Telehealth: Payer: Self-pay | Admitting: Family

## 2015-12-26 ENCOUNTER — Ambulatory Visit (INDEPENDENT_AMBULATORY_CARE_PROVIDER_SITE_OTHER): Payer: Medicare Other

## 2015-12-26 ENCOUNTER — Encounter: Payer: Self-pay | Admitting: Family

## 2015-12-26 ENCOUNTER — Ambulatory Visit (INDEPENDENT_AMBULATORY_CARE_PROVIDER_SITE_OTHER): Payer: Medicare Other | Admitting: Family

## 2015-12-26 VITALS — BP 150/64 | HR 94 | Temp 98.2°F | Ht 59.0 in | Wt 128.4 lb

## 2015-12-26 DIAGNOSIS — J209 Acute bronchitis, unspecified: Secondary | ICD-10-CM | POA: Diagnosis not present

## 2015-12-26 DIAGNOSIS — R062 Wheezing: Secondary | ICD-10-CM | POA: Diagnosis not present

## 2015-12-26 DIAGNOSIS — I10 Essential (primary) hypertension: Secondary | ICD-10-CM

## 2015-12-26 DIAGNOSIS — J849 Interstitial pulmonary disease, unspecified: Secondary | ICD-10-CM

## 2015-12-26 MED ORDER — PREDNISONE 10 MG PO TABS: ORAL_TABLET | ORAL | 0 refills | Status: DC

## 2015-12-26 MED ORDER — DOXYCYCLINE HYCLATE 100 MG PO TABS: 100.0000 mg | ORAL_TABLET | Freq: Two times a day (BID) | ORAL | 0 refills | Status: DC

## 2015-12-26 MED ORDER — ALBUTEROL SULFATE (2.5 MG/3ML) 0.083% IN NEBU
2.5000 mg | INHALATION_SOLUTION | Freq: Once | RESPIRATORY_TRACT | Status: AC
  Administered 2015-12-26: 2.5 mg via RESPIRATORY_TRACT

## 2015-12-26 NOTE — Telephone Encounter (Signed)
Joan Erickson, please call pt Friday and see if she is better on doxycycline.  Let me know if not.  Pt has interstitial PNA; abx sent. She needs close f/u.   Advised husband to let her know and call us if she is not feeling better.   Unable to leave message on cell phone.   appt to repeat cxr- 4 weeks.   Lab appt for INR Monday. Husband understood all questions.

## 2015-12-26 NOTE — Patient Instructions (Addendum)
Chest Xray  Increase intake of clear fluids. Congestion is best treated by hydration, when mucus is wetter, it is thinner, less sticky, and easier to expel from the body, either through coughing up drainage, or by blowing your nose.   Get plenty of rest.   Use saline nasal drops and blow your nose frequently. Run a humidifier at night and elevate the head of the bed. Vicks Vapor rub will help with congestion and cough. Steam showers and sinus massage for congestion.   Use Acetaminophen or Ibuprofen as needed for fever or pain. Avoid second hand smoke. Even the smallest exposure will worsen symptoms.   You can also try a teaspoon of honey to see if this will help reduce cough. Throat lozenges can sometimes be beneficial as well.    This illness will typically last 7 - 10 days.   Please follow up with our clinic if you develop a fever greater than 101 F, symptoms worsen, or do not resolve in the next week.

## 2015-12-26 NOTE — Assessment & Plan Note (Addendum)
Afebrile. No acute respiratory distress. SaO2 95%. Walking SaO2 stayed above 93%.  Patient speaking comfortably in exam room. Will trial honey for cough. Declined cough medication rx. Pending CXR to evaluate for PNA.

## 2015-12-26 NOTE — Progress Notes (Signed)
Pre visit review using our clinic review tool, if applicable. No additional management support is needed unless otherwise documented below in the visit note. 

## 2015-12-26 NOTE — Progress Notes (Signed)
Subjective:    Patient ID: Joan Erickson, female    DOB: 1931/01/28, 82 y.o.   MRN: [id number]  CC: Joan Erickson is a 82 y.o. female who presents today for an acute visit.    HPI: Chief complaint productive cough x 2 weeks, waxing and waning. No wheezing, SOB, sore throat fever. Using albuterol inhaler as she normally does in the  morning and at night for asthma, hasn't needed it more for this cough.   History of asthma, chronic kidney disease, has single kidney.  HTN- 'always elevated when come to doctor.' Denies exertional chest pain or pressure, numbness or tingling radiating to left arm or jaw, palpitations, dizziness, frequent headaches, changes in vision, or shortness of breath.  Takes half tablet of chlorthiadone and half of losartan; otherwise take full tablet of medications.     HISTORY:  Past Medical History:  Diagnosis Date  . Asthma   . Atrial fibrillation ([place])   . GERD (gastroesophageal reflux disease)   . History of colon polyps   . Hypercholesterolemia   . Hypertension   . Hypogammaglobulinemia ([facility])    IgA  . Xanthogranulomatous pyelonephritis    s/p right nephrectomy   Past Surgical History:  Procedure Laterality Date  . ABDOMINAL HYSTERECTOMY     ovaries not removed  . CESAREAN SECTION    . NEPHRECTOMY     right - for xanthogranulomatous pyelonephritis   Family History  Problem Relation Age of Onset  . Brain cancer Mother   . Liver cancer Father     questionabl colon cancer  . Breast cancer Neg Hx     Allergies: Patient has no known allergies. Current Outpatient Prescriptions on File Prior to Visit  Medication Sig Dispense Refill  . ADVAIR DISKUS 250-50 MCG/DOSE AEPB INHALE 1 PUFF BY MOUTH TWICE A DAY 60 each 5  . Blood Glucose Monitoring Suppl (ONE TOUCH ULTRA SYSTEM KIT) W/DEVICE KIT 1 kit by Does not apply route once. 1 each 0  . chlorthalidone (HYGROTON) 25 MG tablet Take 0.5 tablets (12.5 mg total) by mouth daily. 30 tablet 2  .  Cholecalciferol (VITAMIN D) 2000 UNITS tablet Take 2,000 Units by mouth daily.    . citalopram (CELEXA) 10 MG tablet Take 1 tablet (10 mg total) by mouth daily. 90 tablet 1  . diltiazem (TIAZAC) 360 MG 24 hr capsule Take 1 capsule (360 mg total) by mouth daily. 90 capsule 1  . eplerenone (INSPRA) 25 MG tablet TAKE 1 TABLET (25 MG TOTAL) BY MOUTH ONCE DAILY FOR 14 DAYS.  0  . fluticasone (FLONASE) 50 MCG/ACT nasal spray USE 2 SPRAYS IN EACH NOSTRIL ONCE A DAY 16 g 5  . fluticasone (FLONASE) 50 MCG/ACT nasal spray USE 2 SPRAYS IN EACH NOSTRIL ONCE A DAY 16 g 1  . fluticasone (FLONASE) 50 MCG/ACT nasal spray USE 2 SPRAYS IN EACH NOSTRIL ONCE A DAY 16 g 1  . losartan (COZAAR) 100 MG tablet TAKE 1 TABLET BY MOUTH ONCE A DAY 30 tablet 5  . montelukast (SINGULAIR) 10 MG tablet Take 1 tablet (10 mg total) by mouth daily. 90 tablet 1  . ONE TOUCH ULTRA TEST test strip CHECK BLOOD SUGAR 2 TIMES A DAY DX: E11.9 100 each 3  . pravastatin (PRAVACHOL) 20 MG tablet TAKE 1 TABLET (20 MG TOTAL) BY MOUTH DAILY. 30 tablet 8  . PROAIR HFA 108 (90 Base) MCG/ACT inhaler INHALE 2 PUFF EVERY SIX HOURS AS NEEDED 8.5 Inhaler 2  . TRADJENTA 5  MG TABS tablet TAKE 1 TABLET (5 MG TOTAL) BY MOUTH DAILY. 30 tablet 11  . warfarin (COUMADIN) 1 MG tablet Take 1 mg by mouth daily. Take 1/2 tablet as directed  4  . warfarin (COUMADIN) 5 MG tablet As directed     No current facility-administered medications on file prior to visit.     Social History  Substance Use Topics  . Smoking status: Never Smoker  . Smokeless tobacco: Never Used  . Alcohol use No    Review of Systems  Constitutional: Negative for chills and fever.  Respiratory: Negative for cough.   Cardiovascular: Negative for chest pain and palpitations.  Gastrointestinal: Negative for nausea and vomiting.      Objective:    BP (!) 150/64   Pulse 94   Temp 98.2 F (36.8 C) (Oral)   Ht _0  (1.499 m)   Wt 128 lb 6.4 oz (58.2 kg)   SpO2 95%   BMI 25.93  kg/m    Physical Exam  Constitutional: She appears well-developed and well-nourished.  HENT:  Head: Normocephalic and atraumatic.  Right Ear: Hearing, tympanic membrane, external ear and ear canal normal. No drainage, swelling or tenderness. No foreign bodies. Tympanic membrane is not erythematous and not bulging. No middle ear effusion. No decreased hearing is noted.  Left Ear: Hearing, tympanic membrane, external ear and ear canal normal. No drainage, swelling or tenderness. No foreign bodies. Tympanic membrane is not erythematous and not bulging.  No middle ear effusion. No decreased hearing is noted.  Nose: Nose normal. No rhinorrhea. Right sinus exhibits no maxillary sinus tenderness and no frontal sinus tenderness. Left sinus exhibits no maxillary sinus tenderness and no frontal sinus tenderness.  Mouth/Throat: Uvula is midline, oropharynx is clear and moist and mucous membranes are normal. No oropharyngeal exudate, posterior oropharyngeal edema, posterior oropharyngeal erythema or tonsillar abscesses.  Eyes: Conjunctivae and EOM are normal. Pupils are equal, round, and reactive to light.  Fundus normal bilaterally.   Cardiovascular: Normal rate, regular rhythm, normal heart sounds and normal pulses.   Pulmonary/Chest: Effort normal. She has wheezes in the right lower field and the left lower field. She has no rhonchi. She has no rales.  Lymphadenopathy:       Head (right side): No submental, no submandibular, no tonsillar, no preauricular, no posterior auricular and no occipital adenopathy present.       Head (left side): No submental, no submandibular, no tonsillar, no preauricular, no posterior auricular and no occipital adenopathy present.    She has no cervical adenopathy.  Neurological: She is alert. She has normal strength. No cranial nerve deficit or sensory deficit. She displays a negative Romberg sign.  Reflex Scores:      Bicep reflexes are 2+ on the right side and 2+ on the  left side.      Patellar reflexes are 2+ on the right side and 2+ on the left side. Grip equal and strong bilateral upper extremities. Gait strong and steady. Able to perform rapid alternating movement without difficulty.   Skin: Skin is warm and dry.  Psychiatric: She has a normal mood and affect. Her speech is normal and behavior is normal. Thought content normal.  Vitals reviewed.  Patient felt significantly better after albuterol treatment. Lung sounds increased     Assessment & Plan:   Problem List Items Addressed This Visit      Cardiovascular and Mediastinum   Hypertension    Elevated. Had been 147/55 one week ago at nephrology  and patient report similar values at home prior to cough. BP after nebulizer came down nicely, closer to baseline for patient. No signs or symptoms of hypertensive urgency or emergency at this time. Patient and I both suspect treating her infection will correct blood pressure. She agrees to watch blood pressure today and call us if  Greater than 150/90 this afternoon.        Respiratory   Bronchospasm with bronchitis, acute - Primary    Afebrile. No acute respiratory distress. SaO2 95%. Walking SaO2 stayed above 93%.  Patient speaking comfortably in exam room. Will trial honey for cough. Declined cough medication rx. Pending CXR to evaluate for PNA.       Relevant Medications   albuterol (PROVENTIL) (2.5 MG/3ML) 0.083% nebulizer solution 2.5 mg (Completed)   predniSONE (DELTASONE) 10 MG tablet   Other Relevant Orders   DG Chest 2 View        I am having Ms. Buren start on predniSONE. I am also having her maintain her Vitamin D, warfarin, ONE TOUCH ULTRA SYSTEM KIT, fluticasone, warfarin, ONE TOUCH ULTRA TEST, PROAIR HFA, pravastatin, losartan, chlorthalidone, fluticasone, fluticasone, eplerenone, TRADJENTA, citalopram, diltiazem, montelukast, ADVAIR DISKUS, and finasteride. We administered albuterol.   Meds ordered this encounter  Medications  .  finasteride (PROSCAR) 5 MG tablet  . albuterol (PROVENTIL) (2.5 MG/3ML) 0.083% nebulizer solution 2.5 mg  . predniSONE (DELTASONE) 10 MG tablet    Sig: Take 40 mg by mouth on day 1, then taper 10 mg daily until gone    Dispense:  10 tablet    Refill:  0    Order Specific Question:   Supervising Provider    Answer:   Crecencio Mc [[id number]]    Return precautions given.   Risks, benefits, and alternatives of the medications and treatment plan prescribed today were discussed, and patient expressed understanding.   Education regarding symptom management and diagnosis given to patient on AVS.  Continue to follow with Einar Pheasant, MD for routine health maintenance.   [name] and I agreed with plan.   Mable Paris, FNP

## 2015-12-26 NOTE — Assessment & Plan Note (Addendum)
Elevated. Had been 147/55 one week ago at nephrology and patient report similar values at home prior to cough. BP after nebulizer came down nicely, closer to baseline for patient. No signs or symptoms of hypertensive urgency or emergency at this time. Patient and I both suspect treating her infection will correct blood pressure. She agrees to watch blood pressure today and call us if  Greater than 150/90 this afternoon.

## 2015-12-27 NOTE — Telephone Encounter (Signed)
Tried to call patient but was unable to leave a voicemail.

## 2015-12-29 ENCOUNTER — Other Ambulatory Visit: Payer: Self-pay | Admitting: Internal Medicine

## 2015-12-30 ENCOUNTER — Other Ambulatory Visit (INDEPENDENT_AMBULATORY_CARE_PROVIDER_SITE_OTHER): Payer: Medicare Other

## 2015-12-30 ENCOUNTER — Other Ambulatory Visit: Payer: Self-pay | Admitting: Family

## 2015-12-30 ENCOUNTER — Telehealth: Payer: Self-pay | Admitting: Family

## 2015-12-30 DIAGNOSIS — Z7901 Long term (current) use of anticoagulants: Secondary | ICD-10-CM | POA: Diagnosis not present

## 2015-12-30 DIAGNOSIS — J189 Pneumonia, unspecified organism: Secondary | ICD-10-CM

## 2015-12-30 DIAGNOSIS — J849 Interstitial pulmonary disease, unspecified: Secondary | ICD-10-CM

## 2015-12-30 LAB — PROTIME-INR
INR: 4 ratio — ABNORMAL HIGH (ref 0.8–1.0)
Prothrombin Time: 43.9 s — ABNORMAL HIGH (ref 9.6–13.1)

## 2015-12-30 NOTE — Addendum Note (Signed)
Addended by: Leeanne Rio on: 12/30/2015 02:02 PM   Modules accepted: Orders

## 2015-12-30 NOTE — Telephone Encounter (Signed)
Takes 5.5 mg coumadin at night. Will hold. Recheck INR wednesday afternoon.   Takes one more doxycycline. Feeling much better.

## 2015-12-31 NOTE — Telephone Encounter (Signed)
Patient was informed.   Patient had no questions at this time.  Patient stated she feels much better.

## 2016-01-01 ENCOUNTER — Telehealth: Payer: Self-pay | Admitting: Family

## 2016-01-01 ENCOUNTER — Other Ambulatory Visit: Payer: Self-pay | Admitting: Family

## 2016-01-01 ENCOUNTER — Other Ambulatory Visit (INDEPENDENT_AMBULATORY_CARE_PROVIDER_SITE_OTHER): Payer: Medicare Other

## 2016-01-01 DIAGNOSIS — H353112 Nonexudative age-related macular degeneration, right eye, intermediate dry stage: Secondary | ICD-10-CM | POA: Diagnosis not present

## 2016-01-01 DIAGNOSIS — H2513 Age-related nuclear cataract, bilateral: Secondary | ICD-10-CM | POA: Diagnosis not present

## 2016-01-01 DIAGNOSIS — Z7901 Long term (current) use of anticoagulants: Secondary | ICD-10-CM | POA: Diagnosis not present

## 2016-01-01 DIAGNOSIS — H43813 Vitreous degeneration, bilateral: Secondary | ICD-10-CM | POA: Diagnosis not present

## 2016-01-01 DIAGNOSIS — H353221 Exudative age-related macular degeneration, left eye, with active choroidal neovascularization: Secondary | ICD-10-CM | POA: Diagnosis not present

## 2016-01-01 LAB — PROTIME-INR
INR: 3.1 ratio — ABNORMAL HIGH (ref 0.8–1.0)
PROTHROMBIN TIME: 33.3 s — AB (ref 9.6–13.1)

## 2016-01-03 NOTE — Telephone Encounter (Signed)
Please see result note 

## 2016-01-03 NOTE — Telephone Encounter (Signed)
Pt lvm stating that she is returning someone's call that she missed. This is the whole message. Pt cb [phone]

## 2016-01-06 ENCOUNTER — Telehealth: Payer: Self-pay | Admitting: Family

## 2016-01-06 ENCOUNTER — Other Ambulatory Visit (INDEPENDENT_AMBULATORY_CARE_PROVIDER_SITE_OTHER): Payer: Medicare Other

## 2016-01-06 DIAGNOSIS — Z7901 Long term (current) use of anticoagulants: Secondary | ICD-10-CM

## 2016-01-06 LAB — PROTIME-INR
INR: 4.1 ratio — AB (ref 0.8–1.0)
PROTHROMBIN TIME: 44.4 s — AB (ref 9.6–13.1)

## 2016-01-06 NOTE — Telephone Encounter (Signed)
Spoke with pt- Will skip dose today Recheck INR in 2 days

## 2016-01-07 ENCOUNTER — Other Ambulatory Visit: Payer: Self-pay

## 2016-01-07 ENCOUNTER — Other Ambulatory Visit: Payer: Self-pay | Admitting: Internal Medicine

## 2016-01-07 DIAGNOSIS — Z5181 Encounter for therapeutic drug level monitoring: Secondary | ICD-10-CM

## 2016-01-07 DIAGNOSIS — Z7901 Long term (current) use of anticoagulants: Principal | ICD-10-CM

## 2016-01-07 NOTE — Telephone Encounter (Signed)
Spoke with pharmacy and does not need to refill

## 2016-01-07 NOTE — Addendum Note (Signed)
Addended by: Frutoso Chase A on: 01/07/2016 12:33 PM   Modules accepted: Orders

## 2016-01-08 ENCOUNTER — Other Ambulatory Visit (INDEPENDENT_AMBULATORY_CARE_PROVIDER_SITE_OTHER): Payer: Medicare Other

## 2016-01-08 DIAGNOSIS — Z5181 Encounter for therapeutic drug level monitoring: Secondary | ICD-10-CM | POA: Diagnosis not present

## 2016-01-08 DIAGNOSIS — Z7901 Long term (current) use of anticoagulants: Secondary | ICD-10-CM | POA: Diagnosis not present

## 2016-01-08 LAB — PROTIME-INR
INR: 2.7 ratio — ABNORMAL HIGH (ref 0.8–1.0)
Prothrombin Time: 29.1 s — ABNORMAL HIGH (ref 9.6–13.1)

## 2016-01-11 ENCOUNTER — Other Ambulatory Visit: Payer: Self-pay | Admitting: Internal Medicine

## 2016-01-14 DIAGNOSIS — N184 Chronic kidney disease, stage 4 (severe): Secondary | ICD-10-CM | POA: Diagnosis not present

## 2016-02-01 ENCOUNTER — Other Ambulatory Visit: Payer: Self-pay | Admitting: Internal Medicine

## 2016-02-08 ENCOUNTER — Other Ambulatory Visit: Payer: Self-pay | Admitting: Internal Medicine

## 2016-02-11 ENCOUNTER — Encounter: Payer: Self-pay | Admitting: Emergency Medicine

## 2016-02-11 ENCOUNTER — Emergency Department: Payer: Medicare Other

## 2016-02-11 ENCOUNTER — Ambulatory Visit (INDEPENDENT_AMBULATORY_CARE_PROVIDER_SITE_OTHER): Payer: Medicare Other | Admitting: Internal Medicine

## 2016-02-11 ENCOUNTER — Encounter: Payer: Self-pay | Admitting: Internal Medicine

## 2016-02-11 ENCOUNTER — Inpatient Hospital Stay
Admission: EM | Admit: 2016-02-11 | Discharge: 2016-03-19 | DRG: 682 | Disposition: E | Payer: Medicare Other | Attending: Internal Medicine | Admitting: Internal Medicine

## 2016-02-11 VITALS — BP 146/88 | HR 141 | Temp 98.3°F | Resp 16 | Ht 59.0 in | Wt 129.5 lb

## 2016-02-11 DIAGNOSIS — E871 Hypo-osmolality and hyponatremia: Secondary | ICD-10-CM | POA: Diagnosis present

## 2016-02-11 DIAGNOSIS — E872 Acidosis: Secondary | ICD-10-CM | POA: Diagnosis not present

## 2016-02-11 DIAGNOSIS — J45909 Unspecified asthma, uncomplicated: Secondary | ICD-10-CM | POA: Diagnosis present

## 2016-02-11 DIAGNOSIS — M6281 Muscle weakness (generalized): Secondary | ICD-10-CM

## 2016-02-11 DIAGNOSIS — D631 Anemia in chronic kidney disease: Secondary | ICD-10-CM | POA: Diagnosis present

## 2016-02-11 DIAGNOSIS — R0602 Shortness of breath: Secondary | ICD-10-CM

## 2016-02-11 DIAGNOSIS — Z515 Encounter for palliative care: Secondary | ICD-10-CM | POA: Diagnosis not present

## 2016-02-11 DIAGNOSIS — R4020 Unspecified coma: Secondary | ICD-10-CM | POA: Diagnosis not present

## 2016-02-11 DIAGNOSIS — J9690 Respiratory failure, unspecified, unspecified whether with hypoxia or hypercapnia: Secondary | ICD-10-CM

## 2016-02-11 DIAGNOSIS — I10 Essential (primary) hypertension: Secondary | ICD-10-CM | POA: Diagnosis not present

## 2016-02-11 DIAGNOSIS — Z800 Family history of malignant neoplasm of digestive organs: Secondary | ICD-10-CM | POA: Diagnosis not present

## 2016-02-11 DIAGNOSIS — J9601 Acute respiratory failure with hypoxia: Secondary | ICD-10-CM | POA: Diagnosis not present

## 2016-02-11 DIAGNOSIS — J9383 Other pneumothorax: Secondary | ICD-10-CM

## 2016-02-11 DIAGNOSIS — I120 Hypertensive chronic kidney disease with stage 5 chronic kidney disease or end stage renal disease: Secondary | ICD-10-CM | POA: Diagnosis not present

## 2016-02-11 DIAGNOSIS — E8770 Fluid overload, unspecified: Secondary | ICD-10-CM | POA: Diagnosis not present

## 2016-02-11 DIAGNOSIS — N179 Acute kidney failure, unspecified: Principal | ICD-10-CM | POA: Diagnosis present

## 2016-02-11 DIAGNOSIS — I959 Hypotension, unspecified: Secondary | ICD-10-CM | POA: Diagnosis not present

## 2016-02-11 DIAGNOSIS — Z8701 Personal history of pneumonia (recurrent): Secondary | ICD-10-CM

## 2016-02-11 DIAGNOSIS — J9602 Acute respiratory failure with hypercapnia: Secondary | ICD-10-CM | POA: Diagnosis not present

## 2016-02-11 DIAGNOSIS — R0600 Dyspnea, unspecified: Secondary | ICD-10-CM

## 2016-02-11 DIAGNOSIS — J939 Pneumothorax, unspecified: Secondary | ICD-10-CM

## 2016-02-11 DIAGNOSIS — N189 Chronic kidney disease, unspecified: Secondary | ICD-10-CM

## 2016-02-11 DIAGNOSIS — Z7901 Long term (current) use of anticoagulants: Secondary | ICD-10-CM

## 2016-02-11 DIAGNOSIS — R000 Tachycardia, unspecified: Secondary | ICD-10-CM

## 2016-02-11 DIAGNOSIS — K219 Gastro-esophageal reflux disease without esophagitis: Secondary | ICD-10-CM | POA: Diagnosis present

## 2016-02-11 DIAGNOSIS — Z905 Acquired absence of kidney: Secondary | ICD-10-CM

## 2016-02-11 DIAGNOSIS — J209 Acute bronchitis, unspecified: Secondary | ICD-10-CM

## 2016-02-11 DIAGNOSIS — I129 Hypertensive chronic kidney disease with stage 1 through stage 4 chronic kidney disease, or unspecified chronic kidney disease: Secondary | ICD-10-CM | POA: Diagnosis not present

## 2016-02-11 DIAGNOSIS — E1121 Type 2 diabetes mellitus with diabetic nephropathy: Secondary | ICD-10-CM | POA: Diagnosis present

## 2016-02-11 DIAGNOSIS — A499 Bacterial infection, unspecified: Secondary | ICD-10-CM | POA: Diagnosis not present

## 2016-02-11 DIAGNOSIS — J9600 Acute respiratory failure, unspecified whether with hypoxia or hypercapnia: Secondary | ICD-10-CM

## 2016-02-11 DIAGNOSIS — Z0189 Encounter for other specified special examinations: Secondary | ICD-10-CM

## 2016-02-11 DIAGNOSIS — I4891 Unspecified atrial fibrillation: Secondary | ICD-10-CM

## 2016-02-11 DIAGNOSIS — Z79899 Other long term (current) drug therapy: Secondary | ICD-10-CM

## 2016-02-11 DIAGNOSIS — N186 End stage renal disease: Secondary | ICD-10-CM | POA: Diagnosis not present

## 2016-02-11 DIAGNOSIS — Z452 Encounter for adjustment and management of vascular access device: Secondary | ICD-10-CM

## 2016-02-11 DIAGNOSIS — I4892 Unspecified atrial flutter: Secondary | ICD-10-CM | POA: Diagnosis present

## 2016-02-11 DIAGNOSIS — R001 Bradycardia, unspecified: Secondary | ICD-10-CM | POA: Diagnosis not present

## 2016-02-11 DIAGNOSIS — E1122 Type 2 diabetes mellitus with diabetic chronic kidney disease: Secondary | ICD-10-CM | POA: Diagnosis present

## 2016-02-11 DIAGNOSIS — Z4659 Encounter for fitting and adjustment of other gastrointestinal appliance and device: Secondary | ICD-10-CM

## 2016-02-11 DIAGNOSIS — E1165 Type 2 diabetes mellitus with hyperglycemia: Secondary | ICD-10-CM | POA: Diagnosis present

## 2016-02-11 DIAGNOSIS — E8779 Other fluid overload: Secondary | ICD-10-CM | POA: Diagnosis not present

## 2016-02-11 DIAGNOSIS — N184 Chronic kidney disease, stage 4 (severe): Secondary | ICD-10-CM | POA: Diagnosis not present

## 2016-02-11 DIAGNOSIS — Z66 Do not resuscitate: Secondary | ICD-10-CM | POA: Diagnosis not present

## 2016-02-11 DIAGNOSIS — E875 Hyperkalemia: Secondary | ICD-10-CM | POA: Diagnosis present

## 2016-02-11 DIAGNOSIS — E873 Alkalosis: Secondary | ICD-10-CM | POA: Diagnosis present

## 2016-02-11 DIAGNOSIS — J189 Pneumonia, unspecified organism: Secondary | ICD-10-CM | POA: Diagnosis not present

## 2016-02-11 DIAGNOSIS — I471 Supraventricular tachycardia: Secondary | ICD-10-CM | POA: Diagnosis not present

## 2016-02-11 DIAGNOSIS — J45902 Unspecified asthma with status asthmaticus: Secondary | ICD-10-CM | POA: Diagnosis not present

## 2016-02-11 DIAGNOSIS — R7989 Other specified abnormal findings of blood chemistry: Secondary | ICD-10-CM | POA: Diagnosis not present

## 2016-02-11 DIAGNOSIS — Z7951 Long term (current) use of inhaled steroids: Secondary | ICD-10-CM

## 2016-02-11 DIAGNOSIS — T413X5A Adverse effect of local anesthetics, initial encounter: Secondary | ICD-10-CM | POA: Diagnosis not present

## 2016-02-11 DIAGNOSIS — G4700 Insomnia, unspecified: Secondary | ICD-10-CM | POA: Diagnosis not present

## 2016-02-11 DIAGNOSIS — J90 Pleural effusion, not elsewhere classified: Secondary | ICD-10-CM | POA: Diagnosis not present

## 2016-02-11 DIAGNOSIS — J810 Acute pulmonary edema: Secondary | ICD-10-CM | POA: Diagnosis not present

## 2016-02-11 DIAGNOSIS — E782 Mixed hyperlipidemia: Secondary | ICD-10-CM | POA: Diagnosis present

## 2016-02-11 DIAGNOSIS — Z90710 Acquired absence of both cervix and uterus: Secondary | ICD-10-CM

## 2016-02-11 DIAGNOSIS — Z978 Presence of other specified devices: Secondary | ICD-10-CM

## 2016-02-11 DIAGNOSIS — Z4682 Encounter for fitting and adjustment of non-vascular catheter: Secondary | ICD-10-CM | POA: Diagnosis not present

## 2016-02-11 DIAGNOSIS — R791 Abnormal coagulation profile: Secondary | ICD-10-CM | POA: Diagnosis present

## 2016-02-11 DIAGNOSIS — N2581 Secondary hyperparathyroidism of renal origin: Secondary | ICD-10-CM | POA: Diagnosis present

## 2016-02-11 DIAGNOSIS — R4189 Other symptoms and signs involving cognitive functions and awareness: Secondary | ICD-10-CM | POA: Diagnosis not present

## 2016-02-11 DIAGNOSIS — Z808 Family history of malignant neoplasm of other organs or systems: Secondary | ICD-10-CM | POA: Diagnosis not present

## 2016-02-11 DIAGNOSIS — L8990 Pressure ulcer of unspecified site, unspecified stage: Secondary | ICD-10-CM | POA: Insufficient documentation

## 2016-02-11 DIAGNOSIS — D801 Nonfamilial hypogammaglobulinemia: Secondary | ICD-10-CM | POA: Diagnosis present

## 2016-02-11 DIAGNOSIS — R748 Abnormal levels of other serum enzymes: Secondary | ICD-10-CM | POA: Diagnosis not present

## 2016-02-11 DIAGNOSIS — E010 Iodine-deficiency related diffuse (endemic) goiter: Secondary | ICD-10-CM

## 2016-02-11 DIAGNOSIS — Z9689 Presence of other specified functional implants: Secondary | ICD-10-CM

## 2016-02-11 DIAGNOSIS — R601 Generalized edema: Secondary | ICD-10-CM | POA: Diagnosis not present

## 2016-02-11 DIAGNOSIS — M7989 Other specified soft tissue disorders: Secondary | ICD-10-CM | POA: Diagnosis not present

## 2016-02-11 DIAGNOSIS — E7800 Pure hypercholesterolemia, unspecified: Secondary | ICD-10-CM

## 2016-02-11 DIAGNOSIS — T82838A Hemorrhage of vascular prosthetic devices, implants and grafts, initial encounter: Secondary | ICD-10-CM | POA: Diagnosis not present

## 2016-02-11 DIAGNOSIS — J811 Chronic pulmonary edema: Secondary | ICD-10-CM | POA: Diagnosis not present

## 2016-02-11 DIAGNOSIS — I483 Typical atrial flutter: Secondary | ICD-10-CM | POA: Diagnosis not present

## 2016-02-11 DIAGNOSIS — F32A Depression, unspecified: Secondary | ICD-10-CM

## 2016-02-11 DIAGNOSIS — R778 Other specified abnormalities of plasma proteins: Secondary | ICD-10-CM

## 2016-02-11 DIAGNOSIS — I482 Chronic atrial fibrillation: Secondary | ICD-10-CM | POA: Diagnosis present

## 2016-02-11 DIAGNOSIS — R0902 Hypoxemia: Secondary | ICD-10-CM

## 2016-02-11 DIAGNOSIS — Z86010 Personal history of colonic polyps: Secondary | ICD-10-CM

## 2016-02-11 DIAGNOSIS — F320 Major depressive disorder, single episode, mild: Secondary | ICD-10-CM

## 2016-02-11 DIAGNOSIS — R600 Localized edema: Secondary | ICD-10-CM | POA: Diagnosis not present

## 2016-02-11 HISTORY — DX: Long term (current) use of anticoagulants: Z79.01

## 2016-02-11 LAB — CBC WITH DIFFERENTIAL/PLATELET
Basophils Absolute: 0 10*3/uL (ref 0–0.1)
Basophils Relative: 0 %
EOS ABS: 0 10*3/uL (ref 0–0.7)
EOS PCT: 0 %
HCT: 28.2 % — ABNORMAL LOW (ref 35.0–47.0)
Hemoglobin: 9.7 g/dL — ABNORMAL LOW (ref 12.0–16.0)
LYMPHS PCT: 6 %
Lymphs Abs: 0.4 10*3/uL — ABNORMAL LOW (ref 1.0–3.6)
MCH: 28.5 pg (ref 26.0–34.0)
MCHC: 34.5 g/dL (ref 32.0–36.0)
MCV: 82.5 fL (ref 80.0–100.0)
MONO ABS: 0.8 10*3/uL (ref 0.2–0.9)
MONOS PCT: 12 %
Neutro Abs: 5.3 10*3/uL (ref 1.4–6.5)
Neutrophils Relative %: 82 %
PLATELETS: 313 10*3/uL (ref 150–440)
RBC: 3.42 MIL/uL — AB (ref 3.80–5.20)
RDW: 16.8 % — ABNORMAL HIGH (ref 11.5–14.5)
WBC: 6.5 10*3/uL (ref 3.6–11.0)

## 2016-02-11 LAB — URINALYSIS, ROUTINE W REFLEX MICROSCOPIC
Bilirubin Urine: NEGATIVE
GLUCOSE, UA: NEGATIVE mg/dL
Ketones, ur: NEGATIVE mg/dL
Nitrite: NEGATIVE
PH: 5 (ref 5.0–8.0)
Protein, ur: >=300 mg/dL — AB
SPECIFIC GRAVITY, URINE: 1.01 (ref 1.005–1.030)
Squamous Epithelial / LPF: NONE SEEN

## 2016-02-11 LAB — BASIC METABOLIC PANEL
Anion gap: 7 (ref 5–15)
BUN: 78 mg/dL — AB (ref 6–20)
CHLORIDE: 105 mmol/L (ref 101–111)
CO2: 20 mmol/L — ABNORMAL LOW (ref 22–32)
CREATININE: 5.1 mg/dL — AB (ref 0.44–1.00)
Calcium: 7.9 mg/dL — ABNORMAL LOW (ref 8.9–10.3)
GFR calc Af Amer: 8 mL/min — ABNORMAL LOW (ref >60)
GFR calc non Af Amer: 7 mL/min — ABNORMAL LOW (ref >60)
Glucose, Bld: 126 mg/dL — ABNORMAL HIGH (ref 65–99)
POTASSIUM: 4.8 mmol/L (ref 3.5–5.1)
SODIUM: 132 mmol/L — AB (ref 135–145)

## 2016-02-11 LAB — COMPREHENSIVE METABOLIC PANEL
ALT: 29 U/L (ref 14–54)
ANION GAP: 7 (ref 5–15)
AST: 36 U/L (ref 15–41)
Albumin: 2.7 g/dL — ABNORMAL LOW (ref 3.5–5.0)
Alkaline Phosphatase: 61 U/L (ref 38–126)
BUN: 78 mg/dL — ABNORMAL HIGH (ref 6–20)
CALCIUM: 8.1 mg/dL — AB (ref 8.9–10.3)
CHLORIDE: 106 mmol/L (ref 101–111)
CO2: 20 mmol/L — ABNORMAL LOW (ref 22–32)
Creatinine, Ser: 4.95 mg/dL — ABNORMAL HIGH (ref 0.44–1.00)
GFR, EST AFRICAN AMERICAN: 9 mL/min — AB (ref >60)
GFR, EST NON AFRICAN AMERICAN: 7 mL/min — AB (ref >60)
Glucose, Bld: 128 mg/dL — ABNORMAL HIGH (ref 65–99)
Potassium: 5.4 mmol/L — ABNORMAL HIGH (ref 3.5–5.1)
SODIUM: 133 mmol/L — AB (ref 135–145)
Total Bilirubin: 0.7 mg/dL (ref 0.3–1.2)
Total Protein: 7.2 g/dL (ref 6.5–8.1)

## 2016-02-11 LAB — LACTIC ACID, PLASMA: Lactic Acid, Venous: 0.8 mmol/L (ref 0.5–1.9)

## 2016-02-11 LAB — PROCALCITONIN: Procalcitonin: 0.38 ng/mL

## 2016-02-11 LAB — PROTIME-INR
INR: 5.94 — AB
PROTHROMBIN TIME: 54.9 s — AB (ref 11.4–15.2)

## 2016-02-11 LAB — MAGNESIUM: MAGNESIUM: 2.6 mg/dL — AB (ref 1.7–2.4)

## 2016-02-11 LAB — TROPONIN I
TROPONIN I: 0.06 ng/mL — AB (ref <0.03)
TROPONIN I: 0.06 ng/mL — AB (ref <0.03)

## 2016-02-11 LAB — GLUCOSE, CAPILLARY: GLUCOSE-CAPILLARY: 100 mg/dL — AB (ref 65–99)

## 2016-02-11 MED ORDER — HYDROCODONE-ACETAMINOPHEN 5-325 MG PO TABS: 1.0000 | ORAL_TABLET | ORAL | Status: DC | PRN

## 2016-02-11 MED ORDER — DEXTROSE 5 % IV SOLN: 1.0000 g | INTRAVENOUS | Status: DC

## 2016-02-11 MED ORDER — CEFEPIME-DEXTROSE 1 GM/50ML IV SOLR
1.0000 g | INTRAVENOUS | Status: DC
  Administered 2016-02-11: 1 g via INTRAVENOUS
  Filled 2016-02-11 (×2): qty 50

## 2016-02-11 MED ORDER — SODIUM CHLORIDE 0.9 % IV SOLN
INTRAVENOUS | Status: DC
  Administered 2016-02-11 – 2016-02-12 (×2): via INTRAVENOUS

## 2016-02-11 MED ORDER — ONDANSETRON HCL 4 MG PO TABS: 4.0000 mg | ORAL_TABLET | Freq: Four times a day (QID) | ORAL | Status: DC | PRN

## 2016-02-11 MED ORDER — FINASTERIDE 5 MG PO TABS
5.0000 mg | ORAL_TABLET | Freq: Every day | ORAL | Status: DC
  Administered 2016-02-12 – 2016-02-21 (×9): 5 mg via ORAL
  Filled 2016-02-11 (×9): qty 1

## 2016-02-11 MED ORDER — ACETAMINOPHEN 650 MG RE SUPP: 650.0000 mg | Freq: Four times a day (QID) | RECTAL | Status: DC | PRN

## 2016-02-11 MED ORDER — INSULIN ASPART 100 UNIT/ML ~~LOC~~ SOLN
0.0000 [IU] | Freq: Three times a day (TID) | SUBCUTANEOUS | Status: DC
  Administered 2016-02-12 (×2): 1 [IU] via SUBCUTANEOUS
  Administered 2016-02-13: 2 [IU] via SUBCUTANEOUS
  Administered 2016-02-13 (×2): 1 [IU] via SUBCUTANEOUS
  Administered 2016-02-14: 2 [IU] via SUBCUTANEOUS
  Administered 2016-02-14 – 2016-02-16 (×5): 1 [IU] via SUBCUTANEOUS
  Administered 2016-02-16 – 2016-02-17 (×2): 2 [IU] via SUBCUTANEOUS
  Administered 2016-02-17: 1 [IU] via SUBCUTANEOUS
  Administered 2016-02-18 (×2): 2 [IU] via SUBCUTANEOUS
  Administered 2016-02-19: 3 [IU] via SUBCUTANEOUS
  Administered 2016-02-21: 2 [IU] via SUBCUTANEOUS
  Administered 2016-02-21: 1 [IU] via SUBCUTANEOUS
  Administered 2016-02-21: 3 [IU] via SUBCUTANEOUS
  Administered 2016-02-22: 1 [IU] via SUBCUTANEOUS
  Administered 2016-02-22 – 2016-02-23 (×3): 2 [IU] via SUBCUTANEOUS
  Administered 2016-02-23: 3 [IU] via SUBCUTANEOUS
  Administered 2016-02-24 (×2): 1 [IU] via SUBCUTANEOUS
  Administered 2016-02-24: 2 [IU] via SUBCUTANEOUS
  Administered 2016-02-26: 3 [IU] via SUBCUTANEOUS
  Administered 2016-02-26: 1 [IU] via SUBCUTANEOUS
  Administered 2016-02-27: 2 [IU] via SUBCUTANEOUS
  Administered 2016-02-27: 5 [IU] via SUBCUTANEOUS
  Administered 2016-02-27: 1 [IU] via SUBCUTANEOUS
  Filled 2016-02-11: qty 2
  Filled 2016-02-11: qty 1
  Filled 2016-02-11: qty 2
  Filled 2016-02-11 (×3): qty 1
  Filled 2016-02-11: qty 2
  Filled 2016-02-11 (×2): qty 1
  Filled 2016-02-11: qty 3
  Filled 2016-02-11 (×2): qty 1
  Filled 2016-02-11 (×5): qty 2
  Filled 2016-02-11: qty 1
  Filled 2016-02-11: qty 3
  Filled 2016-02-11: qty 1
  Filled 2016-02-11 (×2): qty 2
  Filled 2016-02-11 (×4): qty 1
  Filled 2016-02-11: qty 2
  Filled 2016-02-11 (×2): qty 1
  Filled 2016-02-11: qty 3
  Filled 2016-02-11: qty 1
  Filled 2016-02-11 (×2): qty 2
  Filled 2016-02-11: qty 3

## 2016-02-11 MED ORDER — INSULIN ASPART 100 UNIT/ML ~~LOC~~ SOLN
0.0000 [IU] | Freq: Every day | SUBCUTANEOUS | Status: DC
  Administered 2016-02-13: 2 [IU] via SUBCUTANEOUS
  Filled 2016-02-11: qty 2

## 2016-02-11 MED ORDER — VANCOMYCIN HCL IN DEXTROSE 750-5 MG/150ML-% IV SOLN
750.0000 mg | Freq: Once | INTRAVENOUS | Status: AC
  Administered 2016-02-11: 750 mg via INTRAVENOUS
  Filled 2016-02-11: qty 150

## 2016-02-11 MED ORDER — DILTIAZEM HCL 60 MG PO TABS
120.0000 mg | ORAL_TABLET | ORAL | Status: AC
  Administered 2016-02-11: 120 mg via ORAL
  Filled 2016-02-11: qty 2

## 2016-02-11 MED ORDER — DILTIAZEM HCL ER BEADS 240 MG PO CP24
360.0000 mg | ORAL_CAPSULE | Freq: Every day | ORAL | Status: DC
  Filled 2016-02-11: qty 1

## 2016-02-11 MED ORDER — SODIUM CHLORIDE 0.9 % IV BOLUS (SEPSIS)
500.0000 mL | INTRAVENOUS | Status: AC
  Administered 2016-02-11: 500 mL via INTRAVENOUS

## 2016-02-11 MED ORDER — SODIUM POLYSTYRENE SULFONATE 15 GM/60ML PO SUSP
30.0000 g | Freq: Once | ORAL | Status: AC
  Administered 2016-02-11: 30 g via ORAL
  Filled 2016-02-11: qty 120

## 2016-02-11 MED ORDER — DILTIAZEM HCL 25 MG/5ML IV SOLN
15.0000 mg | INTRAVENOUS | Status: AC
  Administered 2016-02-11: 15 mg via INTRAVENOUS
  Filled 2016-02-11: qty 5

## 2016-02-11 MED ORDER — MONTELUKAST SODIUM 10 MG PO TABS
10.0000 mg | ORAL_TABLET | Freq: Every day | ORAL | Status: DC
  Administered 2016-02-12 – 2016-02-21 (×9): 10 mg via ORAL
  Filled 2016-02-11 (×9): qty 1

## 2016-02-11 MED ORDER — ONDANSETRON HCL 4 MG/2ML IJ SOLN: 4.0000 mg | Freq: Four times a day (QID) | INTRAMUSCULAR | Status: DC | PRN

## 2016-02-11 MED ORDER — CITALOPRAM HYDROBROMIDE 20 MG PO TABS
10.0000 mg | ORAL_TABLET | Freq: Every day | ORAL | Status: DC
  Administered 2016-02-11 – 2016-02-21 (×10): 10 mg via ORAL
  Filled 2016-02-11 (×10): qty 1

## 2016-02-11 MED ORDER — CEFTRIAXONE SODIUM-DEXTROSE 1-3.74 GM-% IV SOLR
1.0000 g | Freq: Once | INTRAVENOUS | Status: AC
  Administered 2016-02-11: 1 g via INTRAVENOUS
  Filled 2016-02-11: qty 50

## 2016-02-11 MED ORDER — PRAVASTATIN SODIUM 40 MG PO TABS
20.0000 mg | ORAL_TABLET | Freq: Every day | ORAL | Status: DC
  Administered 2016-02-11 – 2016-02-27 (×16): 20 mg via ORAL
  Filled 2016-02-11 (×16): qty 1

## 2016-02-11 MED ORDER — SODIUM CHLORIDE 0.9 % IV BOLUS (SEPSIS)
500.0000 mL | Freq: Once | INTRAVENOUS | Status: AC
  Administered 2016-02-11: 500 mL via INTRAVENOUS

## 2016-02-11 MED ORDER — ALBUTEROL SULFATE (2.5 MG/3ML) 0.083% IN NEBU
2.5000 mg | INHALATION_SOLUTION | RESPIRATORY_TRACT | Status: DC | PRN
  Administered 2016-02-13 – 2016-02-19 (×4): 2.5 mg via RESPIRATORY_TRACT
  Filled 2016-02-11 (×5): qty 3

## 2016-02-11 MED ORDER — SODIUM CHLORIDE 0.9% FLUSH
3.0000 mL | Freq: Two times a day (BID) | INTRAVENOUS | Status: DC
  Administered 2016-02-11 – 2016-02-29 (×30): 3 mL via INTRAVENOUS
  Administered 2016-03-01: 10 mL via INTRAVENOUS
  Administered 2016-03-01 – 2016-03-02 (×2): 3 mL via INTRAVENOUS

## 2016-02-11 MED ORDER — DEXTROSE 5 % IV SOLN
500.0000 mg | Freq: Once | INTRAVENOUS | Status: AC
  Administered 2016-02-11: 500 mg via INTRAVENOUS
  Filled 2016-02-11: qty 500

## 2016-02-11 MED ORDER — BISACODYL 5 MG PO TBEC: 5.0000 mg | DELAYED_RELEASE_TABLET | Freq: Every day | ORAL | Status: DC | PRN

## 2016-02-11 MED ORDER — ACETAMINOPHEN 325 MG PO TABS: 650.0000 mg | ORAL_TABLET | Freq: Four times a day (QID) | ORAL | Status: DC | PRN

## 2016-02-11 MED ORDER — MOMETASONE FURO-FORMOTEROL FUM 200-5 MCG/ACT IN AERO
2.0000 | INHALATION_SPRAY | Freq: Two times a day (BID) | RESPIRATORY_TRACT | Status: DC
  Administered 2016-02-11 – 2016-02-21 (×20): 2 via RESPIRATORY_TRACT
  Filled 2016-02-11: qty 8.8

## 2016-02-11 MED ORDER — FLUTICASONE PROPIONATE 50 MCG/ACT NA SUSP
2.0000 | Freq: Every day | NASAL | Status: DC
  Administered 2016-02-12 – 2016-02-21 (×9): 2 via NASAL
  Filled 2016-02-11: qty 16

## 2016-02-11 MED ORDER — SENNOSIDES-DOCUSATE SODIUM 8.6-50 MG PO TABS
1.0000 | ORAL_TABLET | Freq: Every evening | ORAL | Status: DC | PRN
  Filled 2016-02-11 (×2): qty 1

## 2016-02-11 NOTE — ED Notes (Signed)
PAtient placed on 2L for comfort and due to increased labored breathing

## 2016-02-11 NOTE — ED Triage Notes (Signed)
Pt presents to ED via [facility] for c/o tachycardia. Pt states was seen at [facility] today for pneumonia follow up, when she got there noted her HR to be 140. Per EMS pt was dive Atrovent while she was there due to wheezing. EMS reports hx of A-Fib at this time 20G L hand.

## 2016-02-11 NOTE — ED Provider Notes (Signed)
[facility] Emergency Department Provider Note  ____________________________________________   First MD Initiated Contact with Patient 02/12/2016 1116     (approximate)  I have reviewed the triage vital signs and the nursing notes.   HISTORY  Chief Complaint Shortness of Breath and Atrial Flutter    HPI Joan Erickson is a 83 y.o. female with a history of atrial fibrillation on warfarin as well as a history of asthma who presents for evaluation of rapid heartbeat.  She was treated for pneumonia about a month ago and had a follow-up visit today with her primary care doctor, Dr. Nicki Reaper.  At that follow-up visit she was noted to have a heart rate in the 140s so was sent to the emergency department for further evaluation.  The patient denies chest pain and any sensation of heart palpitations or rapid heartbeat.  She does note that since getting pneumonia about a month ago, she has felt increasingly tired and short of breath, especially with exertion.  However she feels that this is not worse recently than it has been over the course of the last month.  She describes her symptoms as mild to moderate.  She has felt cold at times recently but denies any fever or rigors/chills.  She has had some shortness of breath with exertion as described but no resting shortness of breath although she does have some occasional wheezing which she attributes to her asthma.  She denies productive cough, abdominal pain, nausea, vomiting, dysuria, chest pain.  In general she feels fine today and was surprised when her PCP told her that her heart rate was too rapid and that she needed to go to the emergency department.  She reports that she has been compliant with all of her medications recently including her diltiazem ER 360 mg dailyand her daily warfarin.   Past Medical History:  Diagnosis Date  . Asthma   . Atrial fibrillation ([place])   . GERD (gastroesophageal reflux disease)   . History of  colon polyps   . Hypercholesterolemia   . Hypertension   . Hypogammaglobulinemia (HCC)    IgA  . Warfarin anticoagulation   . Xanthogranulomatous pyelonephritis    s/p right nephrectomy    Patient Active Problem List   Diagnosis Date Noted  . ARF (acute renal failure) ([place]) 02/03/2016  . Bronchospasm with bronchitis, acute 12/26/2015  . Swelling of right lower extremity 01/24/2015  . Mild depression ([place]) 12/02/2014  . Loss of weight 11/14/2014  . Health care maintenance 04/08/2014  . Anemia 04/17/2013  . Cerumen impaction 12/25/2012  . History of colonic polyps 12/04/2012  . Atrial fibrillation ([place]) 01/08/2012  . Asthma 01/08/2012  . Hypertension 01/08/2012  . CKD (chronic kidney disease) stage 4, GFR 15-29 ml/min (HCC) 01/08/2012  . Type II diabetes mellitus with nephropathy ([place]) 01/08/2012  . Hypercholesterolemia 01/08/2012  . Polycythemia 01/08/2012  . Thyromegaly 01/08/2012    Past Surgical History:  Procedure Laterality Date  . ABDOMINAL HYSTERECTOMY     ovaries not removed  . CESAREAN SECTION    . NEPHRECTOMY     right - for xanthogranulomatous pyelonephritis    Prior to Admission medications   Medication Sig Start Date End Date Taking? Authorizing Provider  acetaminophen (TYLENOL) 500 MG tablet Take 500 mg by mouth every 6 (six) hours as needed.   Yes Historical Provider, MD  ADVAIR DISKUS 250-50 MCG/DOSE AEPB INHALE 1 PUFF BY MOUTH TWICE A DAY 12/23/15  Yes Einar Pheasant, MD  chlorthalidone (HYGROTON)  25 MG tablet Take 0.5 tablets (12.5 mg total) by mouth daily. 09/04/15  Yes Einar Pheasant, MD  Cholecalciferol (VITAMIN D) 2000 UNITS tablet Take 2,000 Units by mouth daily.   Yes Historical Provider, MD  citalopram (CELEXA) 10 MG tablet Take 1 tablet (10 mg total) by mouth daily. 11/22/15  Yes Einar Pheasant, MD  diltiazem (TIAZAC) 360 MG 24 hr capsule Take 1 capsule (360 mg total) by mouth daily. 11/22/15  Yes Einar Pheasant, MD  finasteride (PROSCAR) 5 MG  tablet  12/11/15  Yes Historical Provider, MD  fluticasone (FLONASE) 50 MCG/ACT nasal spray USE 2 SPRAYS IN EACH NOSTRIL ONCE A DAY 01/14/16  Yes Einar Pheasant, MD  losartan (COZAAR) 100 MG tablet TAKE 1 TABLET BY MOUTH ONCE A DAY Patient taking differently: tk 0.5 t qd 07/24/15  Yes Einar Pheasant, MD  montelukast (SINGULAIR) 10 MG tablet Take 1 tablet (10 mg total) by mouth daily. 11/22/15  Yes Einar Pheasant, MD  pravastatin (PRAVACHOL) 20 MG tablet TAKE 1 TABLET (20 MG TOTAL) BY MOUTH DAILY. 07/24/15  Yes Einar Pheasant, MD  PROAIR HFA 108 [phone] Base) MCG/ACT inhaler INHALE 2 PUFF EVERY SIX HOURS AS NEEDED 12/31/15  Yes Einar Pheasant, MD  TRADJENTA 5 MG TABS tablet TAKE 1 TABLET (5 MG TOTAL) BY MOUTH DAILY. 10/23/15  Yes Einar Pheasant, MD  warfarin (COUMADIN) 1 MG tablet Take 1 mg by mouth at bedtime. Pt takes 0.5 t in addition to the 5 mg dose 01/15/15  Yes Historical Provider, MD  warfarin (COUMADIN) 5 MG tablet Take 5 mg by mouth at bedtime. Pt takes 5 mg dose along with 0.5 of a 1 mg tab qhs   Yes Historical Provider, MD  Blood Glucose Monitoring Suppl (ONE TOUCH ULTRA SYSTEM KIT) W/DEVICE KIT 1 kit by Does not apply route once. 03/21/13   Einar Pheasant, MD  eplerenone (INSPRA) 25 MG tablet TAKE 1 TABLET (25 MG TOTAL) BY MOUTH ONCE DAILY FOR 14 DAYS. 09/11/15   Historical Provider, MD  fluticasone (FLONASE) 50 MCG/ACT nasal spray USE 2 SPRAYS IN EACH NOSTRIL ONCE A DAY 02/03/16   Einar Pheasant, MD  ONE TOUCH ULTRA TEST test strip CHECK BLOOD SUGAR 2 TIMES A DAY DX: E11.9 05/17/15   Einar Pheasant, MD    Allergies Patient has no known allergies.  Family History  Problem Relation Age of Onset  . Brain cancer Mother   . Liver cancer Father     questionabl colon cancer  . Breast cancer Neg Hx     Social History Social History  Substance Use Topics  . Smoking status: Never Smoker  . Smokeless tobacco: Never Used  . Alcohol use No    Review of Systems Constitutional: No fever/chills.   Occasionally feels cold. Eyes: No visual changes. ENT: No sore throat. Cardiovascular: Denies chest pain.  Denies incision of rapid heartbeat or palpitations. Respiratory: Short of breath with exertion, consistent over the last month.  Occasional wheezing Gastrointestinal: No abdominal pain.  No nausea, no vomiting.  No diarrhea.  No constipation. Genitourinary: Negative for dysuria. Musculoskeletal: Negative for back pain. Skin: Negative for rash. Neurological: Negative for headaches, focal weakness or numbness.  10-point ROS otherwise negative.  ____________________________________________   PHYSICAL EXAM:  ED Triage Vitals  Enc Vitals Group     BP 02/12/2016 1127 (!) 159/101     Pulse Rate 01/27/2016 1127 (!) 141     Resp 02/12/2016 1127 (!) 30     Temp 02/08/2016 1127 98.7 F (37.1 C)  Temp Source 01/24/2016 1127 Oral     SpO2 02/05/2016 1125 96 %     Weight --      Height --      Head Circumference --      Peak Flow --      Pain Score --      Pain Loc --      Pain Edu? --      Excl. in [place]? --      Constitutional: Alert and oriented. Well appearing and in no acute distress. Eyes: Conjunctivae are normal. PERRL. EOMI. Head: Atraumatic. Nose: No congestion/rhinnorhea. Mouth/Throat: Mucous membranes are moist.  Oropharynx non-erythematous. Neck: No stridor.  No meningeal signs.   Cardiovascular: Tachycardia with regular rhythm, rate fairly consistent at about 140. Good peripheral circulation. Grossly normal heart sounds. Respiratory: Mild expiratory wheezing.  +Tachypnea but no increased effort (no retractions nor accessory muscles) Gastrointestinal: Soft and nontender. No distention.  Musculoskeletal: No lower extremity tenderness nor edema. No gross deformities of extremities. Neurologic:  Normal speech and language. No gross focal neurologic deficits are appreciated.  Skin:  Skin is warm, dry and intact. No rash noted. Psychiatric: Mood and affect are normal. Speech and  behavior are normal.  ____________________________________________   LABS (all labs ordered are listed, but only abnormal results are displayed)  Labs Reviewed  CBC WITH DIFFERENTIAL/PLATELET - Abnormal; Notable for the following:       Result Value   RBC 3.42 (*)    Hemoglobin 9.7 (*)    HCT 28.2 (*)    RDW 16.8 (*)    Lymphs Abs 0.4 (*)    All other components within normal limits  COMPREHENSIVE METABOLIC PANEL - Abnormal; Notable for the following:    Sodium 133 (*)    Potassium 5.4 (*)    CO2 20 (*)    Glucose, Bld 128 (*)    BUN 78 (*)    Creatinine, Ser 4.95 (*)    Calcium 8.1 (*)    Albumin 2.7 (*)    GFR calc non Af Amer 7 (*)    GFR calc Af Amer 9 (*)    All other components within normal limits  TROPONIN I - Abnormal; Notable for the following:    Troponin I 0.06 (*)    All other components within normal limits  PROTIME-INR - Abnormal; Notable for the following:    Prothrombin Time 54.9 (*)    INR 5.94 (*)    All other components within normal limits  CULTURE, BLOOD (ROUTINE X 2)  CULTURE, BLOOD (ROUTINE X 2)  LACTIC ACID, PLASMA  MAGNESIUM  URINALYSIS, ROUTINE W REFLEX MICROSCOPIC   ____________________________________________  EKG  ED ECG REPORT I, Alexandrina Fiorini, the attending physician, personally viewed and interpreted this ECG.  Date: 02/06/2016 EKG Time: 11:17 AM Rate: 141 Rhythm: A flutter with RVR versus SVT QRS Axis: normal Intervals: No appreciable P waves ST/T Wave abnormalities: Non-specific ST segment / T-wave changes, but no evidence of acute ischemia. Conduction Disturbances: none Narrative Interpretation: unremarkable  ____________________________________________  RADIOLOGY   Dg Chest 2 View  Result Date: 02/16/2016 CLINICAL DATA:  Shortness of breath EXAM: CHEST  2 VIEW COMPARISON:  12/26/2015 FINDINGS: Chronic cardiomegaly. There is basilar and right middle lobe patchy opacity with trace effusions. When accounting for  rotation no convincing vascular pedicle distention. No Kerley lines or pneumothorax. Cholecystectomy clips. IMPRESSION: 1. Patchy densities at the bases and right middle lobe persists. This could reflect recurrent pneumonia or treatment failure. Followup PA  and lateral chest X-ray is recommended in 3-4 weeks following any therapy to ensure resolution and establish baseline. 2. Cardiomegaly and trace pleural effusions.  No pulmonary edema. Electronically Signed   By: Monte Fantasia M.D.   On: 01/26/2016 11:51    ____________________________________________   PROCEDURES  Procedure(s) performed:   .Critical Care Performed by: Hinda Kehr Authorized by: Hinda Kehr   Critical care provider statement:    Critical care time (minutes):  30   Critical care time was exclusive of:  Separately billable procedures and treating other patients   Critical care was necessary to treat or prevent imminent or life-threatening deterioration of the following conditions:  Circulatory failure and cardiac failure   Critical care was time spent personally by me on the following activities:  Development of treatment plan with patient or surrogate, discussions with consultants, evaluation of patient's response to treatment, examination of patient, obtaining history from patient or surrogate, ordering and performing treatments and interventions, ordering and review of laboratory studies, ordering and review of radiographic studies, pulse oximetry, re-evaluation of patient's condition and review of old charts      Critical Care performed: Yes, see critical care procedure note(s) ____________________________________________   INITIAL IMPRESSION / [place] / ED COURSE  Pertinent labs & imaging results that were available during my care of the patient were reviewed by me and considered in my medical decision making (see chart for details).  The patient is having moderate tachycardia but is  essentially asymptomatic except for dyspnea on exertion which has been consistent for the last month.  Her EKG suggests either SVT or in my opinion more likely atrial flutter with RVR.  It does not appear consistent with A. fib with RVR given its regularity but history of an atrial dysrhythmia a flutter with RVR is likely.  Patient has no chest pain and no resting shortness of breath and she has a normal blood pressure.  She states that she is compliant with her warfarin.  We will evaluate broadly particularly given her recent treatment for pneumonia.  Anticipate giving diltiazem 15 mg IV and reassessing.   Clinical Course as of Feb 10 1329  Tue Feb 11, 2016  1230 Acute on chronic renal failure with supratherapeutic INR but no bleeding.  After diltiazem 15 mg IV her heart rate is down to the 1 teens to 120s.  I will give diltiazem 120 mg by mouth and see if this is sufficient to control her rate.  I am also giving a 500 mL fluid bolus.  Her chest x-ray was also questionable for recurrent or persistent pneumonia.  She has no leukocytosis so I think that it is unlikely she is having an acute infection but I will obtain blood cultures, a lactic acid, and treat with ceftriaxone and azithromycin.  I have updated the patient and her husband and I spoke with Dr. Bridgett Larsson the hospitalist who will admit.  [CF]  1330 Lactic acid is normal.  This patient does not meet sepsis criteria.  Discontinuing repeat lactic acid.  [CF]    Clinical Course User Index [CF] Hinda Kehr, MD    ____________________________________________  FINAL CLINICAL IMPRESSION(S) / ED DIAGNOSES  Final diagnoses:  Acute renal failure superimposed on chronic kidney disease, unspecified CKD stage, unspecified acute renal failure type ([place])  Elevated troponin I level  Atrial fibrillation with RVR ([place])  Community acquired pneumonia, unspecified laterality  Supratherapeutic INR     MEDICATIONS GIVEN DURING THIS VISIT:  Medications  azithromycin (ZITHROMAX) 500 mg in dextrose 5 % 250 mL IVPB (not administered)  cefTRIAXone (ROCEPHIN) IVPB 1 g (1 g Intravenous Given 02/12/2016 1302)  diltiazem (CARDIZEM) injection 15 mg (15 mg Intravenous Given 02/06/2016 1156)  sodium chloride 0.9 % bolus 500 mL (500 mLs Intravenous New Bag/Given 01/26/2016 1257)  diltiazem (CARDIZEM) tablet 120 mg (120 mg Oral Given 02/07/2016 1326)     NEW OUTPATIENT MEDICATIONS STARTED DURING THIS VISIT:  New Prescriptions   No medications on file    Modified Medications   No medications on file    Discontinued Medications   FLUTICASONE (FLONASE) 50 MCG/ACT NASAL SPRAY    USE 2 SPRAYS IN EACH NOSTRIL ONCE A DAY     Note:  This document was prepared using Dragon voice recognition software and may include unintentional dictation errors.    Hinda Kehr, MD 02/10/2016 1331

## 2016-02-11 NOTE — ED Notes (Signed)
No chest pain.  afib on monitor.  Pt alert.  Oxygen in place.  Family with pt.  Pt continues to wait on bed assignement.

## 2016-02-11 NOTE — Progress Notes (Signed)
ANTIBIOTIC CONSULT NOTE - INITIAL  Pharmacy Consult for Vancomycin and Cefepime  Indication: pna   No Known Allergies  Patient Measurements:   Adjusted Body Weight:   Vital Signs: Temp: 98.7 F (37.1 C) (01/23 1127) Temp Source: Oral (01/23 1127) BP: 125/67 (01/23 1930) Pulse Rate: 71 (01/23 1930) Intake/Output from previous day: No intake/output data recorded. Intake/Output from this shift: No intake/output data recorded.  Labs:  Recent Labs  01/26/2016 1117  WBC 6.5  HGB 9.7*  PLT 313  CREATININE 4.95*   Estimated Creatinine Clearance: 6.8 mL/min (by C-G formula based on SCr of 4.95 mg/dL (H)). No results for input(s): VANCOTROUGH, VANCOPEAK, VANCORANDOM, GENTTROUGH, GENTPEAK, GENTRANDOM, TOBRATROUGH, TOBRAPEAK, TOBRARND, AMIKACINPEAK, AMIKACINTROU, AMIKACIN in the last 72 hours.   Microbiology: No results found for this or any previous visit (from the past 720 hour(s)).  Medical History: Past Medical History:  Diagnosis Date  . Asthma   . Atrial fibrillation ([place])   . GERD (gastroesophageal reflux disease)   . History of colon polyps   . Hypercholesterolemia   . Hypertension   . Hypogammaglobulinemia (HCC)    IgA  . Warfarin anticoagulation   . Xanthogranulomatous pyelonephritis    s/p right nephrectomy    Medications:   (Not in a hospital admission) Scheduled:  . citalopram  10 mg Oral Daily  . diltiazem  360 mg Oral Daily  . finasteride  5 mg Oral Daily  . fluticasone  2 spray Each Nare Daily  . insulin aspart  0-5 Units Subcutaneous QHS  . [START ON 02/12/2016] insulin aspart  0-9 Units Subcutaneous TID WC  . mometasone-formoterol  2 puff Inhalation BID  . montelukast  10 mg Oral Daily  . [START ON 02/12/2016] pravastatin  20 mg Oral q1800  . sodium chloride flush  3 mL Intravenous Q12H  . sodium polystyrene  30 g Oral Once  . vancomycin  750 mg Intravenous Once   Infusions:  . sodium chloride    . ceFEPime (MAXIPIME) IV      Assessment: Pharmacy consulted to dose and monitor Vancomycin and Cefepime in this 83 year old female being treated for pneumonia. Patient is CKD Stage IV with AKI. Scr: 4.95. Nephrology has been consulted.   Goal of Therapy:  Trough level 15-20   Plan:  Will give Vancomycin 750 mg IV x 1 and will follow renal function closely. Based on kinetic parameters, patient would not receive another dose of Vancomycin for 72 hours.   Cefepime: Will start Cefepime 1g IV q24 hours.   Jemia Fata D 02/10/2016,8:20 PM

## 2016-02-11 NOTE — Progress Notes (Signed)
Pre-visit discussion using our clinic review tool. No additional management support is needed unless otherwise documented below in the visit note.  

## 2016-02-11 NOTE — ED Notes (Signed)
Family asked for [place] sandwich, given to patient.

## 2016-02-11 NOTE — ED Notes (Signed)
Report off to kasey rn  

## 2016-02-11 NOTE — H&P (Signed)
[facility] NAME: Joan Erickson    MR#:  [id number]  DATE OF BIRTH:  Oct 15, 1931  DATE OF ADMISSION:  02/18/2016  PRIMARY CARE PHYSICIAN: Einar Pheasant, MD   REQUESTING/REFERRING PHYSICIAN: Hinda Kehr, MD  CHIEF COMPLAINT:   Chief Complaint  Patient presents with  . Shortness of Breath  . Atrial Flutter   Palpitation and shortness of breath today. HISTORY OF PRESENT ILLNESS:  Joan Erickson  is a 83 y.o. female with a known history of A. fib, hypertension, hyperlipidemia, CKD stage 4 and asthma. The patient was treated for pneumonia with antibiotics a month ago by her primary care doctor. She has been feeling increasingly weak and shortness of breath on exertion for the past few days. Started to have a palpitation today and feels cold. Her PCP told her that her heart rate was fast and sent patient to ED. She was found A. fib with RVR at 140 in the ED and treated with Cardizem IV and by mouth. Heart rate decreased to about 120s. She was found acute renal failure with creatinine increased to 4.95, elevated INR above 5.94. Chest x-ray show persistent pneumonia. She is treated with antibiotics in the ED.  PAST MEDICAL HISTORY:   Past Medical History:  Diagnosis Date  . Asthma   . Atrial fibrillation ([place])   . GERD (gastroesophageal reflux disease)   . History of colon polyps   . Hypercholesterolemia   . Hypertension   . Hypogammaglobulinemia (HCC)    IgA  . Warfarin anticoagulation   . Xanthogranulomatous pyelonephritis    s/p right nephrectomy    PAST SURGICAL HISTORY:   Past Surgical History:  Procedure Laterality Date  . ABDOMINAL HYSTERECTOMY     ovaries not removed  . CESAREAN SECTION    . NEPHRECTOMY     right - for xanthogranulomatous pyelonephritis    SOCIAL HISTORY:   Social History  Substance Use Topics  . Smoking status: Never Smoker  . Smokeless tobacco: Never Used  . Alcohol use No    FAMILY  HISTORY:   Family History  Problem Relation Age of Onset  . Brain cancer Mother   . Liver cancer Father     questionabl colon cancer  . Breast cancer Neg Hx     DRUG ALLERGIES:  No Known Allergies  REVIEW OF SYSTEMS:   Review of Systems  Constitutional: Positive for malaise/fatigue. Negative for chills and fever.  HENT: Negative for congestion, nosebleeds and sore throat.   Eyes: Negative for blurred vision and double vision.  Respiratory: Positive for cough and shortness of breath. Negative for hemoptysis, sputum production, wheezing and stridor.   Cardiovascular: Positive for palpitations and leg swelling. Negative for chest pain and orthopnea.  Gastrointestinal: Negative for abdominal pain, blood in stool, diarrhea, melena, nausea and vomiting.  Genitourinary: Negative for dysuria, flank pain, hematuria and urgency.  Musculoskeletal: Negative for joint pain.  Skin: Negative for itching and rash.  Neurological: Positive for weakness. Negative for dizziness, focal weakness and loss of consciousness.  Psychiatric/Behavioral: Negative for depression. The patient is not nervous/anxious.     MEDICATIONS AT HOME:   Prior to Admission medications   Medication Sig Start Date End Date Taking? Authorizing Provider  acetaminophen (TYLENOL) 500 MG tablet Take 500 mg by mouth every 6 (six) hours as needed.   Yes Historical Provider, MD  ADVAIR DISKUS 250-50 MCG/DOSE AEPB INHALE 1 PUFF BY MOUTH TWICE A DAY 12/23/15  Yes Charlene  Nicki Reaper, MD  chlorthalidone (HYGROTON) 25 MG tablet Take 0.5 tablets (12.5 mg total) by mouth daily. 09/04/15  Yes Einar Pheasant, MD  Cholecalciferol (VITAMIN D) 2000 UNITS tablet Take 2,000 Units by mouth daily.   Yes Historical Provider, MD  citalopram (CELEXA) 10 MG tablet Take 1 tablet (10 mg total) by mouth daily. 11/22/15  Yes Einar Pheasant, MD  diltiazem (TIAZAC) 360 MG 24 hr capsule Take 1 capsule (360 mg total) by mouth daily. 11/22/15  Yes Einar Pheasant, MD    finasteride (PROSCAR) 5 MG tablet  12/11/15  Yes Historical Provider, MD  fluticasone (FLONASE) 50 MCG/ACT nasal spray USE 2 SPRAYS IN EACH NOSTRIL ONCE A DAY 01/14/16  Yes Einar Pheasant, MD  losartan (COZAAR) 100 MG tablet TAKE 1 TABLET BY MOUTH ONCE A DAY Patient taking differently: tk 0.5 t qd 07/24/15  Yes Einar Pheasant, MD  montelukast (SINGULAIR) 10 MG tablet Take 1 tablet (10 mg total) by mouth daily. 11/22/15  Yes Einar Pheasant, MD  pravastatin (PRAVACHOL) 20 MG tablet TAKE 1 TABLET (20 MG TOTAL) BY MOUTH DAILY. 07/24/15  Yes Einar Pheasant, MD  PROAIR HFA 108 [phone] Base) MCG/ACT inhaler INHALE 2 PUFF EVERY SIX HOURS AS NEEDED 12/31/15  Yes Einar Pheasant, MD  TRADJENTA 5 MG TABS tablet TAKE 1 TABLET (5 MG TOTAL) BY MOUTH DAILY. 10/23/15  Yes Einar Pheasant, MD  warfarin (COUMADIN) 1 MG tablet Take 1 mg by mouth at bedtime. Pt takes 0.5 t in addition to the 5 mg dose 01/15/15  Yes Historical Provider, MD  warfarin (COUMADIN) 5 MG tablet Take 5 mg by mouth at bedtime. Pt takes 5 mg dose along with 0.5 of a 1 mg tab qhs   Yes Historical Provider, MD  Blood Glucose Monitoring Suppl (ONE TOUCH ULTRA SYSTEM KIT) W/DEVICE KIT 1 kit by Does not apply route once. 03/21/13   Einar Pheasant, MD  eplerenone (INSPRA) 25 MG tablet TAKE 1 TABLET (25 MG TOTAL) BY MOUTH ONCE DAILY FOR 14 DAYS. 09/11/15   Historical Provider, MD  fluticasone (FLONASE) 50 MCG/ACT nasal spray USE 2 SPRAYS IN EACH NOSTRIL ONCE A DAY 02/03/16   Einar Pheasant, MD  ONE TOUCH ULTRA TEST test strip CHECK BLOOD SUGAR 2 TIMES A DAY DX: E11.9 05/17/15   Einar Pheasant, MD      VITAL SIGNS:  Blood pressure 110/77, pulse 89, temperature 98.7 F (37.1 C), temperature source Oral, resp. rate (!) 27, SpO2 95 %.  PHYSICAL EXAMINATION:  Physical Exam  GENERAL:  83 y.o.-year-old patient lying in the bed with no acute distress. Thin. EYES: Pupils equal, round, reactive to light and accommodation. No scleral icterus. Extraocular muscles intact.   HEENT: Head atraumatic, normocephalic. Oropharynx and nasopharynx clear.  NECK:  Supple, no jugular venous distention. No thyroid enlargement, no tenderness.  LUNGS: Normal breath sounds bilaterally, mild wheezing, no rales,rhonchi or crepitation. No use of accessory muscles of respiration.  CARDIOVASCULAR: Irregular rate and rhythm with tachycardia. No murmurs, rubs, or gallops.  ABDOMEN: Soft, nontender, nondistended. Bowel sounds present. No organomegaly or mass.  EXTREMITIES: Bilateral leg trace edema, no cyanosis, or clubbing.  NEUROLOGIC: Cranial nerves II through XII are intact. Muscle strength 5/5 in all extremities. Sensation intact. Gait not checked.  PSYCHIATRIC: The patient is alert and oriented x 3.  SKIN: No obvious rash, lesion, or ulcer.   LABORATORY PANEL:   CBC  Recent Labs Lab 01/23/2016 1117  WBC 6.5  HGB 9.7*  HCT 28.2*  PLT 313   ------------------------------------------------------------------------------------------------------------------  Chemistries   Recent Labs Lab 01/31/2016 1117  NA 133*  K 5.4*  CL 106  CO2 20*  GLUCOSE 128*  BUN 78*  CREATININE 4.95*  CALCIUM 8.1*  AST 36  ALT 29  ALKPHOS 61  BILITOT 0.7   ------------------------------------------------------------------------------------------------------------------  Cardiac Enzymes  Recent Labs Lab 01/21/2016 1117  TROPONINI 0.06*   ------------------------------------------------------------------------------------------------------------------  RADIOLOGY:  Dg Chest 2 View  Result Date: 02/18/2016 CLINICAL DATA:  Shortness of breath EXAM: CHEST  2 VIEW COMPARISON:  12/26/2015 FINDINGS: Chronic cardiomegaly. There is basilar and right middle lobe patchy opacity with trace effusions. When accounting for rotation no convincing vascular pedicle distention. No Kerley lines or pneumothorax. Cholecystectomy clips. IMPRESSION: 1. Patchy densities at the bases and right middle lobe  persists. This could reflect recurrent pneumonia or treatment failure. Followup PA and lateral chest X-ray is recommended in 3-4 weeks following any therapy to ensure resolution and establish baseline. 2. Cardiomegaly and trace pleural effusions.  No pulmonary edema. Electronically Signed   By: Monte Fantasia M.D.   On: 02/15/2016 11:51      IMPRESSION AND PLAN:   Sepsis with pneumonia The patient will be admitted to medical floor. Continue IV antibiotics, follow-up cultures, NEB when necessary.  Acute renal failure on CKD stage 4 and Hyperkalemia Hold chlorthalidone and losartan due to renal failure, start normal saline IV, give Kayexalate and follow-up BMP. Nephrology consult.  A. fib with RVR. Due to above. Continue Cardizem, hold Coumadin due to elevated INR, get echocardiogram and cardiology consult.  Elevated troponin, due to sepsis, A. fib and renal failure, follow-up troponin level and cardiology consult, hold Coumadin.  Hyponatremia. Continue normal saline IV and follow-up BMP.  Anticoagulopathy. Hold Coumadin, follow-up INR.  Diabetes. Start a sliding scale. Hold Tradjenta. Anemia of chronic disease. Stable.  All the records are reviewed and case discussed with ED provider. Management plans discussed with the patient, her husband and they are in agreement.  CODE STATUS: Full code  TOTAL TIME TAKING CARE OF THIS PATIENT: 62 minutes.    Demetrios Loll M.D on 02/07/2016 at 1:43 PM  Between 7am to 6pm - Pager - [phone]  After 6pm go to [email or web address] - Proofreader  [facility]  Office  [phone]  CC: Primary care physician; Einar Pheasant, MD   Note: This dictation was prepared with Dragon dictation along with smaller phrase technology. Any transcriptional errors that result from this process are unintentional.

## 2016-02-11 NOTE — ED Notes (Signed)
Resumed care from amber rn.  Family with pt.  Pt alert.  No chest pain.   Intermittent sob. Pt on 2 liters oxygen.  Iv in place.  Pt in afib.   Pt talkative.

## 2016-02-11 NOTE — ED Notes (Signed)
Pt resting quietly.  Family with pt.   

## 2016-02-11 NOTE — ED Notes (Signed)
Called report to RN on 2A, RN would like labs drawn (trop and bmp). Due to being needed in another room RN asked Lorriane Shire, RN to collect which she did. Pt updated that they will be transferred to rm 231 soon.

## 2016-02-11 NOTE — ED Notes (Signed)
Transporting patient to room 231-2A

## 2016-02-11 NOTE — Progress Notes (Signed)
Patient ID: Joan Erickson, female   DOB: [birth year], 83 y.o.   MRN: [id number]   Subjective:    Patient ID: Joan Erickson, female    DOB: 05/17/1931, 83 y.o.   MRN: [id number]  HPI  Patient here for a scheduled follow up.  She was initially evaluated by Mable Paris for cough and congestion.  Was placed on abx.  CXR as outlined.  She has been using her rescue inhaler tid and using advair bid.  Reports persistent cough and congestion.  Some colored mucus production.  Some wheezing.  Has noticed some increased sob.  Was sob walking into the clinic today.  Has not noticed increased heart rate or palpitations.  No diarrhea.  No abdominal pain.  States sugars have been doing ok.  Following with nephrology for her kidney function.      Past Medical History:  Diagnosis Date  . Asthma   . Atrial fibrillation ([place])   . GERD (gastroesophageal reflux disease)   . History of colon polyps   . Hypercholesterolemia   . Hypertension   . Hypogammaglobulinemia (HCC)    IgA  . Warfarin anticoagulation   . Xanthogranulomatous pyelonephritis    s/p right nephrectomy   Past Surgical History:  Procedure Laterality Date  . ABDOMINAL HYSTERECTOMY     ovaries not removed  . CESAREAN SECTION    . ELECTROPHYSIOLOGIC STUDY N/A 02/06/2016   Procedure: CARDIOVERSION;  Surgeon: Corey Skains, MD;  Location: [facility] ORS;  Service: Cardiovascular;  Laterality: N/A;  . NEPHRECTOMY     right - for xanthogranulomatous pyelonephritis   Family History  Problem Relation Age of Onset  . Brain cancer Mother   . Liver cancer Father     questionabl colon cancer  . Breast cancer Neg Hx    Social History   Social History  . Marital status: Married    Spouse name: N/A  . Number of children: 1  . Years of education: N/A   Social History Main Topics  . Smoking status: Never Smoker  . Smokeless tobacco: Never Used  . Alcohol use No  . Drug use: No  . Sexual activity: Not Asked   Other Topics Concern  . None     Social History Narrative  . None    No facility-administered encounter medications on file as of 02/06/2016.    Outpatient Encounter Prescriptions as of 02/12/2016  Medication Sig  . ADVAIR DISKUS 250-50 MCG/DOSE AEPB INHALE 1 PUFF BY MOUTH TWICE A DAY  . Blood Glucose Monitoring Suppl (ONE TOUCH ULTRA SYSTEM KIT) W/DEVICE KIT 1 kit by Does not apply route once.  . chlorthalidone (HYGROTON) 25 MG tablet Take 0.5 tablets (12.5 mg total) by mouth daily.  . Cholecalciferol (VITAMIN D) 2000 UNITS tablet Take 2,000 Units by mouth daily.  . citalopram (CELEXA) 10 MG tablet Take 1 tablet (10 mg total) by mouth daily.  Marland Kitchen diltiazem (TIAZAC) 360 MG 24 hr capsule Take 1 capsule (360 mg total) by mouth daily.  Marland Kitchen eplerenone (INSPRA) 25 MG tablet TAKE 1 TABLET (25 MG TOTAL) BY MOUTH ONCE DAILY FOR 14 DAYS.  . finasteride (PROSCAR) 5 MG tablet   . fluticasone (FLONASE) 50 MCG/ACT nasal spray USE 2 SPRAYS IN EACH NOSTRIL ONCE A DAY  . fluticasone (FLONASE) 50 MCG/ACT nasal spray USE 2 SPRAYS IN EACH NOSTRIL ONCE A DAY  . losartan (COZAAR) 100 MG tablet TAKE 1 TABLET BY MOUTH ONCE A DAY (Patient taking differently: tk 0.5 t qd)  .  montelukast (SINGULAIR) 10 MG tablet Take 1 tablet (10 mg total) by mouth daily.  . ONE TOUCH ULTRA TEST test strip CHECK BLOOD SUGAR 2 TIMES A DAY DX: E11.9  . pravastatin (PRAVACHOL) 20 MG tablet TAKE 1 TABLET (20 MG TOTAL) BY MOUTH DAILY.  Marland Kitchen PROAIR HFA 108 (90 Base) MCG/ACT inhaler INHALE 2 PUFF EVERY SIX HOURS AS NEEDED  . TRADJENTA 5 MG TABS tablet TAKE 1 TABLET (5 MG TOTAL) BY MOUTH DAILY.  Marland Kitchen warfarin (COUMADIN) 1 MG tablet Take 1 mg by mouth at bedtime. Pt takes 0.5 t in addition to the 5 mg dose  . warfarin (COUMADIN) 5 MG tablet Take 5 mg by mouth at bedtime. Pt takes 5 mg dose along with 0.5 of a 1 mg tab qhs  . [DISCONTINUED] fluticasone (FLONASE) 50 MCG/ACT nasal spray USE 2 SPRAYS IN EACH NOSTRIL ONCE A DAY    Review of Systems  Constitutional: Negative for  appetite change and unexpected weight change.  HENT: Positive for congestion and postnasal drip.   Respiratory: Positive for cough, shortness of breath and wheezing. Negative for chest tightness.   Cardiovascular: Positive for leg swelling. Negative for chest pain.  Gastrointestinal: Negative for abdominal pain, diarrhea, nausea and vomiting.  Genitourinary: Negative for difficulty urinating and dysuria.  Musculoskeletal: Negative for joint swelling and myalgias.  Skin: Negative for color change and rash.  Neurological: Negative for dizziness, light-headedness and headaches.  Psychiatric/Behavioral: Negative for agitation and dysphoric mood.       Objective:    Physical Exam  Constitutional: She appears well-developed and well-nourished.  HENT:  Mouth/Throat: Oropharynx is clear and moist.  Nares - slightly erythematous turbinates.    Neck: Neck supple. No thyromegaly present.  Cardiovascular:  Appears to be regular with ventricular rate 140s.    Pulmonary/Chest: Breath sounds normal.  Increased cough with expiration.  Wheezing noted on exam.    Abdominal: Soft. Bowel sounds are normal. There is no tenderness.  Musculoskeletal: She exhibits no tenderness.  Lower extremity edema.  No increased erythema.    Lymphadenopathy:    She has no cervical adenopathy.  Skin: No rash noted. No erythema.  Psychiatric: She has a normal mood and affect. Her behavior is normal.    BP (!) 146/88   Pulse (!) 141   Temp 98.3 F (36.8 C) (Oral)   Resp 16   Ht 4' 11"  (1.499 m)   Wt 129 lb 8 oz (58.7 kg)   SpO2 92%   BMI 26.16 kg/m  Wt Readings from Last 3 Encounters:  02/16/16 139 lb 1.8 oz (63.1 kg)  02/02/2016 129 lb 8 oz (58.7 kg)  12/26/15 128 lb 6.4 oz (58.2 kg)     Lab Results  Component Value Date   WBC 9.0 02/16/2016   HGB 8.2 (L) 02/16/2016   HCT 23.8 (L) 02/16/2016   PLT 303 02/16/2016   GLUCOSE 121 (H) 02/16/2016   CHOL 168 02/01/2015   TRIG 56.0 02/01/2015   HDL 65.00  02/01/2015   LDLCALC 92 02/01/2015   ALT 29 02/04/2016   AST 36 02/18/2016   NA 131 (L) 02/16/2016   K 4.1 02/16/2016   CL 96 (L) 02/16/2016   CREATININE 4.39 (H) 02/16/2016   BUN 35 (H) 02/16/2016   CO2 25 02/16/2016   TSH 2.03 11/14/2014   INR 2.53 02/16/2016   HGBA1C 5.5 02/09/2016    Mm Diag Breast Tomo Uni Right  Result Date: 08/30/2015 CLINICAL DATA:  Screening recall for possible right  breast mass. EXAM: 2D DIGITAL DIAGNOSTIC UNILATERAL RIGHT MAMMOGRAM WITH CAD AND ADJUNCT TOMO COMPARISON:  Previous exam(s). ACR Breast Density Category c: The breast tissue is heterogeneously dense, which may obscure small masses. FINDINGS: Spot compression CC and MLO tomograms were performed of the right breast. The initially questioned possible right breast mass resolves on the additional imaging with findings compatible with overlapping fibroglandular tissue. There is no mammographic evidence of malignancy in the right breast. Mammographic images were processed with CAD. IMPRESSION: No mammographic evidence of malignancy in the right breast. RECOMMENDATION: Screening mammogram in one year.(Code:[id number]-[id number]) I have discussed the findings and recommendations with the patient. Results were also provided in writing at the conclusion of the visit. If applicable, a reminder letter will be sent to the patient regarding the next appointment. BI-RADS CATEGORY  1: Negative. Electronically Signed   By: Everlean Alstrom M.D.   On: 08/30/2015 15:17       Assessment & Plan:   Problem List Items Addressed This Visit    Atrial fibrillation ([place])    On coumadin.  It appears she is overdue pt/inr check.  Increased heart rate noted on exam.  Did not improve with neb and improvement in her breathing.  EKG - question of aflutter with vent rate of 140s.  Discussed treatment with her.  Will transfer her to ER for further evaluation and w/up.  Transferred via EMS.        Bronchospasm with bronchitis, acute    Was recently  evaluated and treated for bronchitis.  With persistent cough and congestion.  Wheezing noted on exam.  Sob with exertion.  atrovent neb given.  Breathing improved. She felt better and was more comfortable after neb.  Heart rate still elevated.  To ER/hospital for further treatment.  Will need f/u cxr and/or scan.        CKD (chronic kidney disease) stage 4, GFR 15-29 ml/min (HCC)    Followed by nephrology.        Hypercholesterolemia    On pravastatin.  Low cholesterol diet and exercise.  Follow lipid panel and liver function tests.        Hypertension    Blood pressure elevated today.  Feel related to current ongoing issues.  Has been under good control.  Follow pressures.  Follow metabolic panel.        Mild depression (HCC)    On citalopram.  Stable.        Thyromegaly    Has seen Dr Eddie Dibbles.  Biopsy negative.  Follow.       Type II diabetes mellitus with nephropathy (HCC)    Low carb diet and exercise.  Follow met b and a1c.  States sugars have been under good control.  Follow.         Other Visit Diagnoses    Tachycardia    -  Primary   Relevant Orders   EKG 12-Lead (Completed)   SOB (shortness of breath)       Relevant Orders   EKG 12-Lead (Completed)     I spent 45 minutes with the patient and more than 50% of the time was spent in consultation regarding the above. Time spent obtaining history and treating current increased heart rate and wheezing.  Also discussed treatment and need for hospitalization.       Einar Pheasant, MD

## 2016-02-11 NOTE — Progress Notes (Signed)
MD paged due to pt's HR being elevated, pt's HR bounces from 110's-140's, pt is asymptomatic lying in bed. Per Dr. Ara Kussmaul, she thinks that since pt is being worked up for sepsis that pt did not get enough fluid, since pt only got one 512ml bolus in the ED & continuous fluids @ 100. Dr. Ara Kussmaul wants to give one more 533ml bolus and continue to monitor. Conley Simmonds, RN, BSN

## 2016-02-12 ENCOUNTER — Inpatient Hospital Stay
Admit: 2016-02-12 | Discharge: 2016-02-12 | Disposition: A | Discharge status: Home / Self Care | Payer: Medicare Other | Attending: Internal Medicine | Admitting: Internal Medicine

## 2016-02-12 LAB — CBC
HCT: 25.4 % — ABNORMAL LOW (ref 35.0–47.0)
Hemoglobin: 8.8 g/dL — ABNORMAL LOW (ref 12.0–16.0)
MCH: 28.8 pg (ref 26.0–34.0)
MCHC: 34.6 g/dL (ref 32.0–36.0)
MCV: 83 fL (ref 80.0–100.0)
PLATELETS: 285 10*3/uL (ref 150–440)
RBC: 3.06 MIL/uL — ABNORMAL LOW (ref 3.80–5.20)
RDW: 17 % — AB (ref 11.5–14.5)
WBC: 5.6 10*3/uL (ref 3.6–11.0)

## 2016-02-12 LAB — GLUCOSE, CAPILLARY
GLUCOSE-CAPILLARY: 145 mg/dL — AB (ref 65–99)
GLUCOSE-CAPILLARY: 113 mg/dL — AB (ref 65–99)
GLUCOSE-CAPILLARY: 140 mg/dL — AB (ref 65–99)
Glucose-Capillary: 141 mg/dL — ABNORMAL HIGH (ref 65–99)

## 2016-02-12 LAB — BASIC METABOLIC PANEL
Anion gap: 6 (ref 5–15)
BUN: 77 mg/dL — AB (ref 6–20)
CALCIUM: 7.8 mg/dL — AB (ref 8.9–10.3)
CO2: 22 mmol/L (ref 22–32)
CREATININE: 4.94 mg/dL — AB (ref 0.44–1.00)
Chloride: 106 mmol/L (ref 101–111)
GFR calc Af Amer: 9 mL/min — ABNORMAL LOW (ref >60)
GFR calc non Af Amer: 7 mL/min — ABNORMAL LOW (ref >60)
GLUCOSE: 172 mg/dL — AB (ref 65–99)
POTASSIUM: 4.3 mmol/L (ref 3.5–5.1)
SODIUM: 134 mmol/L — AB (ref 135–145)

## 2016-02-12 LAB — TROPONIN I: TROPONIN I: 0.06 ng/mL — AB (ref <0.03)

## 2016-02-12 LAB — ECHOCARDIOGRAM COMPLETE
Height: 60 in
Weight: 1996.8 oz

## 2016-02-12 LAB — PROTIME-INR
INR: 6.02 — AB
Prothrombin Time: 55.5 seconds — ABNORMAL HIGH (ref 11.4–15.2)

## 2016-02-12 LAB — MRSA PCR SCREENING: MRSA BY PCR: NEGATIVE

## 2016-02-12 LAB — APTT: APTT: 67 s — AB (ref 24–36)

## 2016-02-12 MED ORDER — METOPROLOL TARTRATE 5 MG/5ML IV SOLN
5.0000 mg | Freq: Four times a day (QID) | INTRAVENOUS | Status: DC | PRN
  Administered 2016-02-12: 5 mg via INTRAVENOUS
  Filled 2016-02-12 (×2): qty 5

## 2016-02-12 MED ORDER — METOPROLOL TARTRATE 5 MG/5ML IV SOLN
5.0000 mg | Freq: Four times a day (QID) | INTRAVENOUS | Status: DC | PRN
  Administered 2016-02-12 – 2016-02-13 (×3): 5 mg via INTRAVENOUS
  Filled 2016-02-12 (×2): qty 5

## 2016-02-12 MED ORDER — SODIUM CHLORIDE 0.9 % IV SOLN: INTRAVENOUS | Status: DC

## 2016-02-12 MED ORDER — METOPROLOL TARTRATE 5 MG/5ML IV SOLN
5.0000 mg | Freq: Once | INTRAVENOUS | Status: AC
  Administered 2016-02-12: 5 mg via INTRAVENOUS
  Filled 2016-02-12: qty 5

## 2016-02-12 MED ORDER — SODIUM CHLORIDE 0.9 % IV SOLN
250.0000 mL | INTRAVENOUS | Status: DC
  Administered 2016-02-13: 07:00:00 via INTRAVENOUS

## 2016-02-12 MED ORDER — SODIUM CHLORIDE 0.9% FLUSH
3.0000 mL | Freq: Two times a day (BID) | INTRAVENOUS | Status: DC
  Administered 2016-02-12 – 2016-02-13 (×3): 3 mL via INTRAVENOUS

## 2016-02-12 MED ORDER — DILTIAZEM HCL ER COATED BEADS 240 MG PO CP24
360.0000 mg | ORAL_CAPSULE | Freq: Every day | ORAL | Status: DC
  Administered 2016-02-12 – 2016-02-21 (×9): 360 mg via ORAL
  Filled 2016-02-12: qty 2
  Filled 2016-02-12: qty 1
  Filled 2016-02-12 (×2): qty 2
  Filled 2016-02-12: qty 1
  Filled 2016-02-12 (×5): qty 2

## 2016-02-12 MED ORDER — METOPROLOL TARTRATE 50 MG PO TABS
50.0000 mg | ORAL_TABLET | Freq: Two times a day (BID) | ORAL | Status: DC
  Administered 2016-02-12 – 2016-02-13 (×3): 50 mg via ORAL
  Filled 2016-02-12 (×3): qty 1

## 2016-02-12 MED ORDER — SODIUM CHLORIDE 0.9% FLUSH: 3.0000 mL | INTRAVENOUS | Status: DC | PRN

## 2016-02-12 NOTE — Plan of Care (Signed)
Problem: Physical Regulation: Goal: Ability to maintain clinical measurements within normal limits will improve Outcome: Progressing Patient's remains in atrial flutter however heart rate now stable 80-90s.

## 2016-02-12 NOTE — Progress Notes (Signed)
*  PRELIMINARY RESULTS* Echocardiogram 2D Echocardiogram has been performed.  Joan Erickson 02/12/2016, 9:34 AM

## 2016-02-12 NOTE — Progress Notes (Signed)
Pt's HR sustaining in the 140's, but pt is in a sinus rhythm and asymptomatic. Spoke with Dr. Ara Kussmaul in person, orders to give 5mg  metoprolol IV push once. Will give and continue to monitor. Conley Simmonds, RN, BSN

## 2016-02-12 NOTE — Consult Note (Signed)
Date: 02/12/2016                  Patient Name:  Joan Erickson  MRN: [id number]  DOB: 1931/09/12  Age / Sex: 83 y.o., female         PCP: Einar Pheasant, MD                 Service Requesting Consult: Internal medicine                 Reason for Consult: Acute on chronic renal failure            History of Present Illness: Patient is a 83 y.o. 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  She states that she has been dealing with this pneumonia and respiratory illness for the past month or so.  She has productive sputum.  She also has increased swelling in the legs for the past 2-3 weeks  Evaluation so far has revealed atrial flutter with rapid ventricular response causing hemodynamic instability Her admission creatinine was 4.95, BUN 78 and potassium 5.4 Today's labs show creatinine of 4.94, BUN 37 and potassium 4.3. .    Medications: Outpatient medications: Prescriptions Prior to Admission  Medication Sig Dispense Refill Last Dose  . acetaminophen (TYLENOL) 500 MG tablet Take 500 mg by mouth every 6 (six) hours as needed.   prn at prn  . ADVAIR DISKUS 250-50 MCG/DOSE AEPB INHALE 1 PUFF BY MOUTH TWICE A DAY 60 each 5 02/10/2016 at 2100  . chlorthalidone (HYGROTON) 25 MG tablet Take 0.5 tablets (12.5 mg total) by mouth daily. 30 tablet 2 01/27/2016 at 0900  . Cholecalciferol (VITAMIN D) 2000 UNITS tablet Take 2,000 Units by mouth daily.   02/04/2016 at 0900  . citalopram (CELEXA) 10 MG tablet Take 1 tablet (10 mg total) by mouth daily. 90 tablet 1 01/31/2016 at 0900  . diltiazem (TIAZAC) 360 MG 24 hr capsule Take 1 capsule (360 mg total) by mouth daily. 90 capsule 1 01/31/2016 at 0900  . finasteride (PROSCAR) 5 MG  tablet    01/26/2016 at 0900  . fluticasone (FLONASE) 50 MCG/ACT nasal spray USE 2 SPRAYS IN EACH NOSTRIL ONCE A DAY 16 g 5 02/10/2016 at Unknown time  . losartan (COZAAR) 100 MG tablet TAKE 1 TABLET BY MOUTH ONCE A DAY (Patient taking differently: tk 0.5 t qd) 30 tablet 5 02/10/2016 at 0900  . montelukast (SINGULAIR) 10 MG tablet Take 1 tablet (10 mg total) by mouth daily. 90 tablet 1 02/18/2016 at 0900  . pravastatin (PRAVACHOL) 20 MG tablet TAKE 1 TABLET (20 MG TOTAL) BY MOUTH DAILY. 30 tablet 8 02/10/2016 at 2100  . PROAIR HFA 108 (90 Base) MCG/ACT inhaler INHALE 2 PUFF EVERY SIX HOURS AS NEEDED 8.5 Inhaler 2 02/10/2016 at 0900  . TRADJENTA 5 MG TABS tablet TAKE 1 TABLET (5 MG TOTAL) BY MOUTH DAILY. 30 tablet 11 01/25/2016 at 0900  . warfarin (COUMADIN) 1 MG tablet Take 1 mg by mouth at bedtime. Pt takes 0.5 t in addition to the 5 mg dose  4 02/10/2016 at 2100  . warfarin (COUMADIN) 5 MG tablet Take 5 mg by mouth at bedtime. Pt takes 5 mg dose along with 0.5 of a 1 mg tab qhs   02/10/2016 at 2100  .  Blood Glucose Monitoring Suppl (ONE TOUCH ULTRA SYSTEM KIT) W/DEVICE KIT 1 kit by Does not apply route once. 1 each 0 Taking  . eplerenone (INSPRA) 25 MG tablet TAKE 1 TABLET (25 MG TOTAL) BY MOUTH ONCE DAILY FOR 14 DAYS.  0 Taking  . fluticasone (FLONASE) 50 MCG/ACT nasal spray USE 2 SPRAYS IN EACH NOSTRIL ONCE A DAY 16 g 1 Taking  . ONE TOUCH ULTRA TEST test strip CHECK BLOOD SUGAR 2 TIMES A DAY DX: E11.9 100 each 3 Taking    Current medications: Current Facility-Administered Medications  Medication Dose Route Frequency Provider Last Rate Last Dose  . 0.9 %  sodium chloride infusion   Intravenous Continuous Corey Skains, MD      . 0.9 %  sodium chloride infusion  250 mL Intravenous Continuous Corey Skains, MD      . acetaminophen (TYLENOL) tablet 650 mg  650 mg Oral Q6H PRN Demetrios Loll, MD       Or  . acetaminophen (TYLENOL) suppository 650 mg  650 mg Rectal Q6H PRN Demetrios Loll, MD      .  albuterol (PROVENTIL) (2.5 MG/3ML) 0.083% nebulizer solution 2.5 mg  2.5 mg Nebulization Q2H PRN Demetrios Loll, MD      . bisacodyl (DULCOLAX) EC tablet 5 mg  5 mg Oral Daily PRN Demetrios Loll, MD      . citalopram (CELEXA) tablet 10 mg  10 mg Oral Daily Demetrios Loll, MD   10 mg at 02/18/2016 2245  . diltiazem (CARDIZEM CD) 24 hr capsule 360 mg  360 mg Oral Daily Demetrios Loll, MD   360 mg at 02/12/16 0830  . finasteride (PROSCAR) tablet 5 mg  5 mg Oral Daily Demetrios Loll, MD   5 mg at 02/12/16 0830  . fluticasone (FLONASE) 50 MCG/ACT nasal spray 2 spray  2 spray Each Nare Daily Demetrios Loll, MD   2 spray at 02/12/16 [phone]  . HYDROcodone-acetaminophen (NORCO/VICODIN) 5-325 MG per tablet 1-2 tablet  1-2 tablet Oral Q4H PRN Demetrios Loll, MD      . insulin aspart (novoLOG) injection 0-5 Units  0-5 Units Subcutaneous QHS Demetrios Loll, MD      . insulin aspart (novoLOG) injection 0-9 Units  0-9 Units Subcutaneous TID WC Demetrios Loll, MD   1 Units at 02/12/16 1214  . metoprolol (LOPRESSOR) tablet 50 mg  50 mg Oral BID Corey Skains, MD   50 mg at 02/12/16 7062  . mometasone-formoterol (DULERA) 200-5 MCG/ACT inhaler 2 puff  2 puff Inhalation BID Demetrios Loll, MD   2 puff at 02/12/16 0830  . montelukast (SINGULAIR) tablet 10 mg  10 mg Oral Daily Demetrios Loll, MD   10 mg at 02/12/16 0830  . ondansetron (ZOFRAN) tablet 4 mg  4 mg Oral Q6H PRN Demetrios Loll, MD       Or  . ondansetron [facility]) injection 4 mg  4 mg Intravenous Q6H PRN Demetrios Loll, MD      . pravastatin (PRAVACHOL) tablet 20 mg  20 mg Oral q1800 Demetrios Loll, MD   20 mg at 02/09/2016 2245  . senna-docusate (Senokot-S) tablet 1 tablet  1 tablet Oral QHS PRN Demetrios Loll, MD      . sodium chloride flush (NS) 0.9 % injection 3 mL  3 mL Intravenous Q12H Demetrios Loll, MD   3 mL at 01/20/2016 2058  . sodium chloride flush (NS) 0.9 % injection 3 mL  3 mL Intravenous Q12H Corey Skains, MD  3 mL at 02/12/16 0831  . sodium chloride flush (NS) 0.9 % injection 3 mL  3 mL Intravenous PRN Corey Skains,  MD          Allergies: No Known Allergies    Past Medical History: Past Medical History:  Diagnosis Date  . Asthma   . Atrial fibrillation ([place])   . GERD (gastroesophageal reflux disease)   . History of colon polyps   . Hypercholesterolemia   . Hypertension   . Hypogammaglobulinemia (H[facility])    IgA  . Warfarin anticoagulation   . Xanthogranulomatous pyelonephritis    s/p right nephrectomy     Past Surgical History: Past Surgical History:  Procedure Laterality Date  . ABDOMINAL HYSTERECTOMY     ovaries not removed  . CESAREAN SECTION    . NEPHRECTOMY     right - for xanthogranulomatous pyelonephritis     Family History: Family History  Problem Relation Age of Onset  . Brain cancer Mother   . Liver cancer Father     questionabl colon cancer  . Breast cancer Neg Hx      Social History: Social History   Social History  . Marital status: Married    Spouse name: N/A  . Number of children: 1  . Years of education: N/A   Occupational History  . Not on file.   Social History Main Topics  . Smoking status: Never Smoker  . Smokeless tobacco: Never Used  . Alcohol use No  . Drug use: No  . Sexual activity: Not on file   Other Topics Concern  . Not on file   Social History Narrative  . No narrative on file     Review of Systems: Gen: no fevers or chills, no weight loss HEENT: no vision or hearing problems CV: shortness of breath, dyspnea on exertion Resp: cough, sputum EM:VVKPQAES is good, no nausea or vomiting GU : no blood in the urine, able to void without an problems MS: no joint pains Derm:  no acute complaints Psych:no complaints Heme: no complaints Neuro: no complaints Endocrine.  No complaints  Vital Signs: Blood pressure 116/77, pulse (!) 56, temperature 97.8 F (36.6 C), temperature source Oral, resp. rate 18, height 5' (1.524 m), weight 56.6 kg (124 lb 12.8 oz), SpO2 92 %.   Intake/Output Summary (Last 24 hours) at 02/12/16  1732 Last data filed at 02/12/16 1036  Gross per 24 hour  Intake          1411.34 ml  Output              300 ml  Net          1111.34 ml    Weight trends: [employer]   01/26/2016 2038  Weight: 56.6 kg (124 lb 12.8 oz)    Physical Exam: General:  well-appearing,  Appears younger than stated age  3 Anicteric, moist oral mucous membranes  Neck:  supple, no masses  Lungs: Diffuse wheezing, bilateral crackles  Heart:: irregular rate and rhythm  Abdomen: Soft, nontender, nondistended  Extremities:  2+ pitting edema bilaterally, L > Rt  Neurologic: Alert, oriented, no focal deficits  Skin: Warm, dry, normal turgor             Lab results: Basic Metabolic Panel:  Recent Labs Lab 02/17/2016 1117 02/18/2016 2010 02/12/16 0159  NA 133* 132* 134*  K 5.4* 4.8 4.3  CL 106 105 106  CO2 20* 20* 22  GLUCOSE 128* 126* 172*  BUN 78*  78* 77*  CREATININE 4.95* 5.10* 4.94*  CALCIUM 8.1* 7.9* 7.8*  MG  --  2.6*  --     Liver Function Tests:  Recent Labs Lab 01/24/2016 1117  AST 36  ALT 29  ALKPHOS 61  BILITOT 0.7  PROT 7.2  ALBUMIN 2.7*   No results for input(s): LIPASE, AMYLASE in the last 168 hours. No results for input(s): AMMONIA in the last 168 hours.  CBC:  Recent Labs Lab 01/31/2016 1117 02/12/16 0159  WBC 6.5 5.6  NEUTROABS 5.3  --   HGB 9.7* 8.8*  HCT 28.2* 25.4*  MCV 82.5 83.0  PLT 313 285    Cardiac Enzymes:  Recent Labs Lab 02/12/16 0159  TROPONINI 0.06*    BNP: Invalid input(s): POCBNP  CBG:  Recent Labs Lab 02/09/2016 2123 02/12/16 0719 02/12/16 1153 02/12/16 1641  GLUCAP 100* 141* 145* 113*    Microbiology: Recent Results (from the past 720 hour(s))  Blood culture (routine x 2)     Status: None (Preliminary result)   Collection Time: 02/18/2016 12:31 PM  Result Value Ref Range Status   Specimen Description BLOOD RIGHT AC  Final   Special Requests   Final    BOTTLES DRAWN AEROBIC AND ANAEROBIC AER 11ML ANA 8ML   Culture NO  GROWTH < 24 HOURS  Final   Report Status PENDING  Incomplete  Blood culture (routine x 2)     Status: None (Preliminary result)   Collection Time: 01/29/2016 12:32 PM  Result Value Ref Range Status   Specimen Description BLOOD LEFT HAND  Final   Special Requests   Final    BOTTLES DRAWN AEROBIC AND ANAEROBIC AER 13ML ANA 13ML   Culture NO GROWTH < 24 HOURS  Final   Report Status PENDING  Incomplete  MRSA PCR Screening     Status: None   Collection Time: 02/06/2016 10:32 PM  Result Value Ref Range Status   MRSA by PCR NEGATIVE NEGATIVE Final    Comment:        The GeneXpert MRSA Assay (FDA approved for NASAL specimens only), is one component of a comprehensive MRSA colonization surveillance program. It is not intended to diagnose MRSA infection nor to guide or monitor treatment for MRSA infections.      Coagulation Studies:  Recent Labs  01/24/2016 1117 02/12/16 0159  LABPROT 54.9* 55.5*  INR 5.94* 6.02*    Urinalysis:  Recent Labs  01/27/2016 1428  COLORURINE YELLOW*  LABSPEC 1.010  PHURINE 5.0  GLUCOSEU NEGATIVE  HGBUR SMALL*  BILIRUBINUR NEGATIVE  KETONESUR NEGATIVE  PROTEINUR >=300*  NITRITE NEGATIVE  LEUKOCYTESUR SMALL*        Imaging: Dg Chest 2 View  Result Date: 02/04/2016 CLINICAL DATA:  Shortness of breath EXAM: CHEST  2 VIEW COMPARISON:  12/26/2015 FINDINGS: Chronic cardiomegaly. There is basilar and right middle lobe patchy opacity with trace effusions. When accounting for rotation no convincing vascular pedicle distention. No Kerley lines or pneumothorax. Cholecystectomy clips. IMPRESSION: 1. Patchy densities at the bases and right middle lobe persists. This could reflect recurrent pneumonia or treatment failure. Followup PA and lateral chest X-ray is recommended in 3-4 weeks following any therapy to ensure resolution and establish baseline. 2. Cardiomegaly and trace pleural effusions.  No pulmonary edema. Electronically Signed   By: Monte Fantasia  M.D.   On: 02/02/2016 11:51      Assessment & Plan: Pt is a 83 y.o. 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 Acute renal failure on chronic kidney disease st 4 0 versus progression to chronic kidney disease stage V Worsening of renal function is likely secondary to abnormal cardiorenal hemodynamics caused by arrhythmias.  Currently she is getting IV normal saline and appears to be fluid overloaded therefore we will discontinue maintenance fluids Patient is getting antiarrhythmics to control her heart rhythm Hopefully, with improved hemodynamics, would lead to improve renal function  2.Shortness of breath, Pneumonia versus arrhythmia versus pulmonary edema Evaluation is in progress Discontinue maintainence IV saline  3.  Lower extremity edema Low-salt diet  4. Hyperkinemia Improved with Kayexalate

## 2016-02-12 NOTE — Progress Notes (Signed)
Pt's HR has began to sustain in the 140's again, sinus tach, pt still asymptomatic. MD paged, Dr. Estanislado Pandy thinks it is due to pt being septic & her fluids being depleted. He gave orders to give 5mg  IV push metoprolol q 6hr for HR  >120 as long as BP allows. Will continue to monitor. Conley Simmonds, RN, BSN

## 2016-02-12 NOTE — Progress Notes (Signed)
Patients HR sustaining 140's with HTN. MD notified. 5mg  metoprolol IV given. Will continue to monitor.

## 2016-02-12 NOTE — Care Management Important Message (Signed)
Important Message  Patient Details  Name: Joan Erickson MRN: [id number] Date of Birth: [birth year]   Medicare Important Message Given:  Yes Initial signed IM printed from Epic and given to patient.    Katrina Stack, RN 02/12/2016, 1:10 PM

## 2016-02-12 NOTE — Plan of Care (Signed)
Problem: Pain Managment: Goal: General experience of comfort will improve Outcome: Progressing Pt has had no complaints of pain

## 2016-02-12 NOTE — Consult Note (Signed)
[facility] Cardiology Consultation Note  Patient ID: Joan Erickson, MRN: [id number], DOB/AGE: 81-Aug-1933 81 y.o. Admit date: 01/30/2016   Date of Consult: 02/12/2016 Primary Physician: Einar Pheasant, MD Primary Cardiologist: Ubaldo Glassing  Chief Complaint:  Chief Complaint  Patient presents with  . Shortness of Breath  . Atrial Flutter   Reason for Consult: atrial flutter with rapid ventricular rate  HPI: 83 y.o. female with the known essential hypertension mixed hyperlipidemia with the paroxysmal nonvalvular atrial fibrillation. The patient recently has had a pneumonia for which she had appropriate medication management but had full recovery and then still had shortness of breath and occasional cough. She was readmitted at this time for a concern of recurrent pneumonia and placed on appropriate antibiotics. The patient has had some shortness of breath and cough but this is slightly improved today. Additionally the patient had a significant amount of rapid heart rate which appeared to be more regular and more consistent with atrial flutter with rapid ventricular rate. The patient converted to atrial flutter and now has had diltiazem and metoprolol. The patient has not had a significant amount of improvements of heart rate and currently heart rate is 142 bpm. She is mildly short of breath. We have considered the possibility of further treatment including electrical cardioversion to normal sinus rhythm if patient does not cardiovert on her own with medication management listed below. Troponin more consistent with demand ischemia rather than acute coronary syndrome  Past Medical History:  Diagnosis Date  . Asthma   . Atrial fibrillation ([place])   . GERD (gastroesophageal reflux disease)   . History of colon polyps   . Hypercholesterolemia   . Hypertension   . Hypogammaglobulinemia ([facility])    IgA  . Warfarin anticoagulation   . Xanthogranulomatous pyelonephritis    s/p right nephrectomy       Surgical History:  Past Surgical History:  Procedure Laterality Date  . ABDOMINAL HYSTERECTOMY     ovaries not removed  . CESAREAN SECTION    . NEPHRECTOMY     right - for xanthogranulomatous pyelonephritis     Home Meds: Prior to Admission medications   Medication Sig Start Date End Date Taking? Authorizing Provider  acetaminophen (TYLENOL) 500 MG tablet Take 500 mg by mouth every 6 (six) hours as needed.   Yes Historical Provider, MD  ADVAIR DISKUS 250-50 MCG/DOSE AEPB INHALE 1 PUFF BY MOUTH TWICE A DAY 12/23/15  Yes Einar Pheasant, MD  chlorthalidone (HYGROTON) 25 MG tablet Take 0.5 tablets (12.5 mg total) by mouth daily. 09/04/15  Yes Einar Pheasant, MD  Cholecalciferol (VITAMIN D) 2000 UNITS tablet Take 2,000 Units by mouth daily.   Yes Historical Provider, MD  citalopram (CELEXA) 10 MG tablet Take 1 tablet (10 mg total) by mouth daily. 11/22/15  Yes Einar Pheasant, MD  diltiazem (TIAZAC) 360 MG 24 hr capsule Take 1 capsule (360 mg total) by mouth daily. 11/22/15  Yes Einar Pheasant, MD  finasteride (PROSCAR) 5 MG tablet  12/11/15  Yes Historical Provider, MD  fluticasone (FLONASE) 50 MCG/ACT nasal spray USE 2 SPRAYS IN EACH NOSTRIL ONCE A DAY 01/14/16  Yes Einar Pheasant, MD  losartan (COZAAR) 100 MG tablet TAKE 1 TABLET BY MOUTH ONCE A DAY Patient taking differently: tk 0.5 t qd 07/24/15  Yes Einar Pheasant, MD  montelukast (SINGULAIR) 10 MG tablet Take 1 tablet (10 mg total) by mouth daily. 11/22/15  Yes Einar Pheasant, MD  pravastatin (PRAVACHOL) 20 MG tablet TAKE 1 TABLET (20 MG TOTAL) BY  MOUTH DAILY. 07/24/15  Yes Einar Pheasant, MD  PROAIR HFA 108 [phone] Base) MCG/ACT inhaler INHALE 2 PUFF EVERY SIX HOURS AS NEEDED 12/31/15  Yes Einar Pheasant, MD  TRADJENTA 5 MG TABS tablet TAKE 1 TABLET (5 MG TOTAL) BY MOUTH DAILY. 10/23/15  Yes Einar Pheasant, MD  warfarin (COUMADIN) 1 MG tablet Take 1 mg by mouth at bedtime. Pt takes 0.5 t in addition to the 5 mg dose 01/15/15  Yes Historical  Provider, MD  warfarin (COUMADIN) 5 MG tablet Take 5 mg by mouth at bedtime. Pt takes 5 mg dose along with 0.5 of a 1 mg tab qhs   Yes Historical Provider, MD  Blood Glucose Monitoring Suppl (ONE TOUCH ULTRA SYSTEM KIT) W/DEVICE KIT 1 kit by Does not apply route once. 03/21/13   Einar Pheasant, MD  eplerenone (INSPRA) 25 MG tablet TAKE 1 TABLET (25 MG TOTAL) BY MOUTH ONCE DAILY FOR 14 DAYS. 09/11/15   Historical Provider, MD  fluticasone (FLONASE) 50 MCG/ACT nasal spray USE 2 SPRAYS IN EACH NOSTRIL ONCE A DAY 02/03/16   Einar Pheasant, MD  ONE TOUCH ULTRA TEST test strip CHECK BLOOD SUGAR 2 TIMES A DAY DX: E11.9 05/17/15   Einar Pheasant, MD    Inpatient Medications:  . ceFEPime (MAXIPIME) IV  1 g Intravenous Q24H  . citalopram  10 mg Oral Daily  . diltiazem  360 mg Oral Daily  . finasteride  5 mg Oral Daily  . fluticasone  2 spray Each Nare Daily  . insulin aspart  0-5 Units Subcutaneous QHS  . insulin aspart  0-9 Units Subcutaneous TID WC  . mometasone-formoterol  2 puff Inhalation BID  . montelukast  10 mg Oral Daily  . pravastatin  20 mg Oral q1800  . sodium chloride flush  3 mL Intravenous Q12H   . sodium chloride 100 mL/hr at 02/12/16 0440    Allergies: No Known Allergies  Social History   Social History  . Marital status: Married    Spouse name: N/A  . Number of children: 1  . Years of education: N/A   Occupational History  . Not on file.   Social History Main Topics  . Smoking status: Never Smoker  . Smokeless tobacco: Never Used  . Alcohol use No  . Drug use: No  . Sexual activity: Not on file   Other Topics Concern  . Not on file   Social History Narrative  . No narrative on file     Family History  Problem Relation Age of Onset  . Brain cancer Mother   . Liver cancer Father     questionabl colon cancer  . Breast cancer Neg Hx      Review of Systems Positive for Cough and congestion Negative for: General:  chills, fever, night sweats or weight  changes.  Cardiovascular: PND orthopnea syncope dizziness  Dermatological skin lesions rashes Respiratory: Positive for Cough congestion Urologic: Frequent urination urination at night and hematuria Abdominal: negative for nausea, vomiting, diarrhea, bright red blood per rectum, melena, or hematemesis Neurologic: negative for visual changes, and/or hearing changes  All other systems reviewed and are otherwise negative except as noted above.  Labs:  Recent Labs  02/05/2016 1117 02/16/2016 2010 02/12/16 0159  TROPONINI 0.06* 0.06* 0.06*   Lab Results  Component Value Date   WBC 5.6 02/12/2016   HGB 8.8 (L) 02/12/2016   HCT 25.4 (L) 02/12/2016   MCV 83.0 02/12/2016   PLT 285 02/12/2016    Recent Labs  Lab 02/04/2016 1117  02/12/16 0159  NA 133*  < > 134*  K 5.4*  < > 4.3  CL 106  < > 106  CO2 20*  < > 22  BUN 78*  < > 77*  CREATININE 4.95*  < > 4.94*  CALCIUM 8.1*  < > 7.8*  PROT 7.2  --   --   BILITOT 0.7  --   --   ALKPHOS 61  --   --   ALT 29  --   --   AST 36  --   --   GLUCOSE 128*  < > 172*  < > = values in this interval not displayed. Lab Results  Component Value Date   CHOL 168 02/01/2015   HDL 65.00 02/01/2015   LDLCALC 92 02/01/2015   TRIG 56.0 02/01/2015   No results found for: DDIMER  Radiology/Studies:  Dg Chest 2 View  Result Date: 02/10/2016 CLINICAL DATA:  Shortness of breath EXAM: CHEST  2 VIEW COMPARISON:  12/26/2015 FINDINGS: Chronic cardiomegaly. There is basilar and right middle lobe patchy opacity with trace effusions. When accounting for rotation no convincing vascular pedicle distention. No Kerley lines or pneumothorax. Cholecystectomy clips. IMPRESSION: 1. Patchy densities at the bases and right middle lobe persists. This could reflect recurrent pneumonia or treatment failure. Followup PA and lateral chest X-ray is recommended in 3-4 weeks following any therapy to ensure resolution and establish baseline. 2. Cardiomegaly and trace pleural  effusions.  No pulmonary edema. Electronically Signed   By: Monte Fantasia M.D.   On: 01/23/2016 11:51    EKG: Atrial flutter with rapid ventricular rate  Weights: Filed Weights   02/19/2016 2038  Weight: 56.6 kg (124 lb 12.8 oz)     Physical Exam: Blood pressure 134/82, pulse (!) 142, temperature 98.7 F (37.1 C), temperature source Oral, resp. rate 18, height 5' (1.524 m), weight 56.6 kg (124 lb 12.8 oz), SpO2 94 %. Body mass index is 24.37 kg/m. General: Well developed, well nourished, in no acute distress. Head eyes ears nose throat: Normocephalic, atraumatic, sclera non-icteric, no xanthomas, nares are without discharge. No apparent thyromegaly and/or mass  Lungs: Normal respiratory effort.  no wheezes, Few rales, some rhonchi.  Heart: Regular but rapid with normal S1 S2. no murmur gallop, no rub, PMI is normal size and placement, carotid upstroke normal without bruit, jugular venous pressure is normal Abdomen: Soft, non-tender, non-distended with normoactive bowel sounds. No hepatomegaly. No rebound/guarding. No obvious abdominal masses. Abdominal aorta is normal size without bruit Extremities: No edema. no cyanosis, no clubbing, no ulcers  Peripheral : 2+ bilateral upper extremity pulses, 2+ bilateral femoral pulses, 2+ bilateral dorsal pedal pulse Neuro: Alert and oriented. No facial asymmetry. No focal deficit. Moves all extremities spontaneously. Musculoskeletal: Normal muscle tone without kyphosis Psych:  Responds to questions appropriately with a normal affect.    Assessment: 83 year old female with essential hypertension mixed hyperlipidemia with recurrent pneumonia cough and congestion with now new onset atrial flutter with rapid ventricular rate  Plan: 1. Continue anticoagulation with warfarin and reinstate to warfarin dose later today at a lower dose to reduce possible bleeding complications with higher INR admitted with but keeping an INR greater than 2 2. Continue  diltiazem but add metoprolol for heart rate control watching for lower blood pressure 3. Consider echoFor LV systolic dysfunction valvular heart disease contributing to above 4. Consider electrical cardioversion as necessary for atrial flutter with rapid ventricular rate. Patient understands risk and benefits of electrical  cardioversion area includes a possibility of death stroke heart attack Reaction to medication management and other rhythm disturbances. She is at low risk for general anesthesia Signed, Corey Skains M.D. [facility] 02/12/2016, 7:59 AM

## 2016-02-12 NOTE — Progress Notes (Signed)
[place] [facility] [place] NAME: Joan Erickson    MR#:  [id number]  DATE OF BIRTH:  30-Aug-1931  SUBJECTIVE:   Came in with weakness  and sob and found to be in afib with HR 140 Feels a whole lot better  REVIEW OF SYSTEMS:   Review of Systems  Constitutional: Negative for chills, fever and weight loss.  HENT: Negative for ear discharge, ear pain and nosebleeds.   Eyes: Negative for blurred vision, pain and discharge.  Respiratory: Positive for shortness of breath. Negative for sputum production, wheezing and stridor.   Cardiovascular: Negative for chest pain, palpitations, orthopnea and PND.  Gastrointestinal: Negative for abdominal pain, diarrhea, nausea and vomiting.  Genitourinary: Negative for frequency and urgency.  Musculoskeletal: Negative for back pain and joint pain.  Neurological: Positive for weakness. Negative for sensory change, speech change and focal weakness.  Psychiatric/Behavioral: Negative for depression and hallucinations. The patient is not nervous/anxious.    Tolerating Diet:yes Tolerating PT: pending  DRUG ALLERGIES:  No Known Allergies  VITALS:  Blood pressure 116/77, pulse (!) 56, temperature 97.8 F (36.6 C), temperature source Oral, resp. rate 18, height 5' (1.524 m), weight 56.6 kg (124 lb 12.8 oz), SpO2 92 %.  PHYSICAL EXAMINATION:   Physical Exam  GENERAL:  83 y.o.-year-old patient lying in the bed with no acute distress.  EYES: Pupils equal, round, reactive to light and accommodation. No scleral icterus. Extraocular muscles intact.  HEENT: Head atraumatic, normocephalic. Oropharynx and nasopharynx clear.  NECK:  Supple, no jugular venous distention. No thyroid enlargement, no tenderness.  LUNGS: Normal breath sounds bilaterally, no wheezing, rales, rhonchi. No use of accessory muscles of respiration.  CARDIOVASCULAR: S1, S2 normal. No murmurs, rubs, or gallops.  ABDOMEN: Soft, nontender, nondistended.  Bowel sounds present. No organomegaly or mass.  EXTREMITIES: No cyanosis, clubbing or edema b/l.    NEUROLOGIC: Cranial nerves II through XII are intact. No focal Motor or sensory deficits b/l.   PSYCHIATRIC:  patient is alert and oriented x 3.  SKIN: No obvious rash, lesion, or ulcer.   LABORATORY PANEL:  CBC  Recent Labs Lab 02/12/16 0159  WBC 5.6  HGB 8.8*  HCT 25.4*  PLT 285    Chemistries   Recent Labs Lab 01/26/2016 1117 01/27/2016 2010 02/12/16 0159  NA 133* 132* 134*  K 5.4* 4.8 4.3  CL 106 105 106  CO2 20* 20* 22  GLUCOSE 128* 126* 172*  BUN 78* 78* 77*  CREATININE 4.95* 5.10* 4.94*  CALCIUM 8.1* 7.9* 7.8*  MG  --  2.6*  --   AST 36  --   --   ALT 29  --   --   ALKPHOS 61  --   --   BILITOT 0.7  --   --    Cardiac Enzymes  Recent Labs Lab 02/12/16 0159  TROPONINI 0.06*   RADIOLOGY:  Dg Chest 2 View  Result Date: 01/29/2016 CLINICAL DATA:  Shortness of breath EXAM: CHEST  2 VIEW COMPARISON:  12/26/2015 FINDINGS: Chronic cardiomegaly. There is basilar and right middle lobe patchy opacity with trace effusions. When accounting for rotation no convincing vascular pedicle distention. No Kerley lines or pneumothorax. Cholecystectomy clips. IMPRESSION: 1. Patchy densities at the bases and right middle lobe persists. This could reflect recurrent pneumonia or treatment failure. Followup PA and lateral chest X-ray is recommended in 3-4 weeks following any therapy to ensure resolution and establish baseline. 2. Cardiomegaly and trace pleural effusions.  No pulmonary edema. Electronically Signed   By: Monte Fantasia M.D.   On: 01/30/2016 11:51   ASSESSMENT AND PLAN:  Joan Erickson  is a 83 y.o. female with a known history of A. fib, hypertension, hyperlipidemia, CKD stage 4 and asthma. The patient was treated for pneumonia with antibiotics a month ago by her primary care doctor. She has been feeling increasingly weak and shortness of breath on exertion for the past few  days. Started to have a palpitation today and feels cold. Her PCP told her that her heart rate was fast and sent patient to ED. She was found A. fib with RVR at 140   * acute on chrnicA. fib with RVR. -Continue Cardizem, hold Coumadin due to elevated INR, get echocardiogram -cardiology consult noted -HR today in the 70-80's -pt feels a lot better  *Acute renal failure on CKD stage 4 and Hyperkalemia Hold chlorthalidone and losartan due to renal failure -on normal saline IV, give Kayexalate and follow-up BMP.  -Nephrology consulted -creat 5.10--4.94  *I do not feel pt has recurrent pneumonia -CXR same, no wbc, no cough, procalcitonin normal -no fever -clinically stbale D/c abxs for now. D/w pt and husband and they are in agreement  *Elevated troponin, due to  A. fib and renal failure, follow-up troponin level and cardiology consult, hold Coumadin.  *Hyponatremia. Continue normal saline IV and follow-up BMP.  *Anticoagulopathy. Hold Coumadin, follow-up INR.  *Diabetes. Start a sliding scale. Hold Tradjenta  Case discussed with Care Management/Social Worker. Management plans discussed with the patient, family and they are in agreement.  CODE STATUS: full  DVT Prophylaxis: on ocumadin TOTAL TIME TAKING CARE OF THIS PATIENT: *27minutes.  >50% time spent on counselling and coordination of care  POSSIBLE D/C IN 1-2 DAYS, DEPENDING ON CLINICAL CONDITION.  Note: This dictation was prepared with Dragon dictation along with smaller phrase technology. Any transcriptional errors that result from this process are unintentional.  Yesika Rispoli M.D on 02/12/2016 at 5:29 PM  Between 7am to 6pm - Pager - [phone]  After 6pm go to [email or web address] - password [email or web address] [facility]  Office  [phone]  CC: Primary care physician; Einar Pheasant, MD

## 2016-02-13 ENCOUNTER — Encounter: Admission: EM | Disposition: E | Payer: Self-pay | Source: Home / Self Care | Attending: Internal Medicine

## 2016-02-13 ENCOUNTER — Inpatient Hospital Stay: Payer: Medicare Other | Admitting: Anesthesiology

## 2016-02-13 ENCOUNTER — Encounter: Payer: Self-pay | Admitting: Anesthesiology

## 2016-02-13 DIAGNOSIS — J45909 Unspecified asthma, uncomplicated: Secondary | ICD-10-CM | POA: Diagnosis not present

## 2016-02-13 DIAGNOSIS — I4891 Unspecified atrial fibrillation: Secondary | ICD-10-CM | POA: Diagnosis not present

## 2016-02-13 DIAGNOSIS — I10 Essential (primary) hypertension: Secondary | ICD-10-CM | POA: Diagnosis not present

## 2016-02-13 HISTORY — PX: ELECTROPHYSIOLOGIC STUDY: SHX172A

## 2016-02-13 LAB — GLUCOSE, CAPILLARY
GLUCOSE-CAPILLARY: 156 mg/dL — AB (ref 65–99)
GLUCOSE-CAPILLARY: 204 mg/dL — AB (ref 65–99)
Glucose-Capillary: 132 mg/dL — ABNORMAL HIGH (ref 65–99)
Glucose-Capillary: 135 mg/dL — ABNORMAL HIGH (ref 65–99)

## 2016-02-13 LAB — BLOOD CULTURE ID PANEL (REFLEXED)
ACINETOBACTER BAUMANNII: NOT DETECTED
CANDIDA ALBICANS: NOT DETECTED
CANDIDA GLABRATA: NOT DETECTED
CANDIDA PARAPSILOSIS: NOT DETECTED
CANDIDA TROPICALIS: NOT DETECTED
Candida krusei: NOT DETECTED
ENTEROBACTERIACEAE SPECIES: NOT DETECTED
ENTEROCOCCUS SPECIES: NOT DETECTED
Enterobacter cloacae complex: NOT DETECTED
Escherichia coli: NOT DETECTED
HAEMOPHILUS INFLUENZAE: NOT DETECTED
KLEBSIELLA OXYTOCA: NOT DETECTED
KLEBSIELLA PNEUMONIAE: NOT DETECTED
LISTERIA MONOCYTOGENES: NOT DETECTED
NEISSERIA MENINGITIDIS: NOT DETECTED
Proteus species: NOT DETECTED
Pseudomonas aeruginosa: NOT DETECTED
STREPTOCOCCUS AGALACTIAE: NOT DETECTED
STREPTOCOCCUS PYOGENES: NOT DETECTED
STREPTOCOCCUS SPECIES: NOT DETECTED
Serratia marcescens: NOT DETECTED
Staphylococcus aureus (BCID): NOT DETECTED
Staphylococcus species: NOT DETECTED
Streptococcus pneumoniae: NOT DETECTED

## 2016-02-13 LAB — CBC
HEMATOCRIT: 28.7 % — AB (ref 35.0–47.0)
HEMOGLOBIN: 9.7 g/dL — AB (ref 12.0–16.0)
MCH: 28.3 pg (ref 26.0–34.0)
MCHC: 33.7 g/dL (ref 32.0–36.0)
MCV: 83.9 fL (ref 80.0–100.0)
Platelets: 360 10*3/uL (ref 150–440)
RBC: 3.42 MIL/uL — ABNORMAL LOW (ref 3.80–5.20)
RDW: 16.9 % — ABNORMAL HIGH (ref 11.5–14.5)
WBC: 6.9 10*3/uL (ref 3.6–11.0)

## 2016-02-13 LAB — PROTIME-INR
INR: 4.25
Prothrombin Time: 42 seconds — ABNORMAL HIGH (ref 11.4–15.2)

## 2016-02-13 LAB — BASIC METABOLIC PANEL
Anion gap: 7 (ref 5–15)
BUN: 79 mg/dL — ABNORMAL HIGH (ref 6–20)
CHLORIDE: 100 mmol/L — AB (ref 101–111)
CO2: 19 mmol/L — AB (ref 22–32)
CREATININE: 5.45 mg/dL — AB (ref 0.44–1.00)
Calcium: 7.6 mg/dL — ABNORMAL LOW (ref 8.9–10.3)
GFR calc non Af Amer: 7 mL/min — ABNORMAL LOW (ref >60)
GFR, EST AFRICAN AMERICAN: 8 mL/min — AB (ref >60)
Glucose, Bld: 146 mg/dL — ABNORMAL HIGH (ref 65–99)
POTASSIUM: 4.8 mmol/L (ref 3.5–5.1)
Sodium: 126 mmol/L — ABNORMAL LOW (ref 135–145)

## 2016-02-13 LAB — EXPECTORATED SPUTUM ASSESSMENT W GRAM STAIN, RFLX TO RESP C: Special Requests: NORMAL

## 2016-02-13 LAB — EXPECTORATED SPUTUM ASSESSMENT W REFEX TO RESP CULTURE

## 2016-02-13 LAB — HEMOGLOBIN A1C
HEMOGLOBIN A1C: 5.5 % (ref 4.8–5.6)
MEAN PLASMA GLUCOSE: 111 mg/dL

## 2016-02-13 LAB — INFLUENZA PANEL BY PCR (TYPE A & B)
INFLAPCR: NEGATIVE
INFLBPCR: NEGATIVE

## 2016-02-13 SURGERY — CARDIOVERSION (CATH LAB)
Anesthesia: General

## 2016-02-13 MED ORDER — GUAIFENESIN-DM 100-10 MG/5ML PO SYRP
5.0000 mL | ORAL_SOLUTION | ORAL | Status: DC | PRN
  Administered 2016-02-13 – 2016-02-15 (×2): 5 mL via ORAL
  Filled 2016-02-13 (×2): qty 5

## 2016-02-13 MED ORDER — DIPHENHYDRAMINE HCL 25 MG PO CAPS
25.0000 mg | ORAL_CAPSULE | Freq: Every evening | ORAL | Status: DC | PRN
  Administered 2016-02-13: 25 mg via ORAL
  Filled 2016-02-13: qty 1

## 2016-02-13 MED ORDER — PROPOFOL 10 MG/ML IV BOLUS
INTRAVENOUS | Status: DC | PRN
  Administered 2016-02-13: 50 mg via INTRAVENOUS

## 2016-02-13 MED ORDER — ZOLPIDEM TARTRATE 5 MG PO TABS
5.0000 mg | ORAL_TABLET | Freq: Every evening | ORAL | Status: DC | PRN
  Administered 2016-02-15 – 2016-02-21 (×4): 5 mg via ORAL
  Filled 2016-02-13 (×4): qty 1

## 2016-02-13 MED ORDER — PROPOFOL 10 MG/ML IV BOLUS
INTRAVENOUS | Status: AC
  Filled 2016-02-13: qty 20

## 2016-02-13 NOTE — Anesthesia Preprocedure Evaluation (Signed)
Anesthesia Evaluation  Patient identified by MRN, date of birth, ID band Patient awake    Reviewed: Allergy & Precautions, H&P , NPO status , Patient's Chart, lab work & pertinent test results  History of Anesthesia Complications Negative for: history of anesthetic complications  Airway Mallampati: III  TM Distance: <3 FB Neck ROM: limited    Dental  (+) Poor Dentition, Chipped, Missing, Partial Lower, Partial Upper   Pulmonary neg shortness of breath, asthma , pneumonia, unresolved,    Pulmonary exam normal breath sounds clear to auscultation       Cardiovascular Exercise Tolerance: Good hypertension, (-) angina(-) Past MI and (-) DOE  Rhythm:regular Rate:Tachycardia     Neuro/Psych PSYCHIATRIC DISORDERS Depression negative neurological ROS  negative psych ROS   GI/Hepatic Neg liver ROS, GERD  Controlled,  Endo/Other  diabetes, Type 2  Renal/GU Renal disease  negative genitourinary   Musculoskeletal   Abdominal   Peds  Hematology negative hematology ROS (+)   Anesthesia Other Findings Past Medical History: No date: Asthma No date: Atrial fibrillation (HCC) No date: GERD (gastroesophageal reflux disease) No date: History of colon polyps No date: Hypercholesterolemia No date: Hypertension No date: Hypogammaglobulinemia (HCC)     Comment: IgA No date: Warfarin anticoagulation 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  Past Surgical History: No date: ABDOMINAL HYSTERECTOMY     Comment: ovaries not removed No date: CESAREAN SECTION No date: NEPHRECTOMY     Comment: right - for xanthogranulomatous pyelonephritis  BMI    Body Mass Index:  24.22 kg/m      Reproductive/Obstetrics negative OB ROS                             Anesthesia Physical Anesthesia Plan  ASA: IV  Anesthesia Plan: General   Post-op Pain Management:    Induction:    Airway Management Planned:   Additional Equipment:   Intra-op Plan:   Post-operative Plan:   Informed Consent: I have reviewed the patients History and Physical, chart, labs and discussed the procedure including the risks, benefits and alternatives for the proposed anesthesia with the patient or authorized representative who has indicated his/her understanding and acceptance.   Dental Advisory Given  Plan Discussed with: Anesthesiologist, CRNA and Surgeon  Anesthesia Plan Comments:         Anesthesia Quick Evaluation

## 2016-02-13 NOTE — Plan of Care (Signed)
Problem: Physical Regulation: Goal: Ability to maintain clinical measurements within normal limits will improve Outcome: Progressing Patient in sinus rhythm and HR remaining 60s-100.

## 2016-02-13 NOTE — Progress Notes (Signed)
[facility] Encounter Note  Patient: Joan Erickson / Admit Date: 02/10/2016 / Date of Encounter: 02/18/2016, 8:24 AM   Subjective: Patient with continued atrial flutter with rapid ventricular rate this a.m. Continuing to have shortness of breath cough and congestion. INR still elevated although slightly improved. Electrical cardioversion of atrial flutter to normal sinus rhythm without complication  Review of Systems: Positive for: [place] of breath cough congestion Negative for: Vision change, hearing change, syncope, dizziness, nausea, vomiting,diarrhea, bloody stool, stomach pain, positive for cough, congestion, negative for diaphoresis, urinary frequency, urinary pain,skin lesions, skin rashes Others previously listed  Objective: Telemetry: Normal sinus rhythm Physical Exam: Blood pressure (!) 100/59, pulse 79, temperature 98.4 F (36.9 C), temperature source Oral, resp. rate 20, height 5' (1.524 m), weight 56.2 kg (124 lb), SpO2 92 %. Body mass index is 24.22 kg/m. General: Well developed, well nourished, in no acute distress. Head: Normocephalic, atraumatic, sclera non-icteric, no xanthomas, nares are without discharge. Neck: No apparent masses Lungs: Normal respirations with some wheezes, diffuse rhonchi, no rales , U crackles   Heart: Regular rate and rhythm, normal S1 S2, no murmur, no rub, no gallop, PMI is normal size and placement, carotid upstroke normal without bruit, jugular venous pressure normal Abdomen: Soft, non-tender, non-distended with normoactive bowel sounds. No hepatosplenomegaly. Abdominal aorta is normal size without bruit Extremities: No edema, no clubbing, no cyanosis, no ulcers,  Peripheral: 2+ radial, 2+ femoral, 2+ dorsal pedal pulses Neuro: Alert and oriented. Moves all extremities spontaneously. Psych:  Responds to questions appropriately with a normal affect.   Intake/Output Summary (Last 24 hours) at 01/22/2016 0824 Last data filed  at 02/06/2016 0803  Gross per 24 hour  Intake              390 ml  Output                0 ml  Net              390 ml    Inpatient Medications:  . [MAR Hold] citalopram  10 mg Oral Daily  . [MAR Hold] diltiazem  360 mg Oral Daily  . [MAR Hold] finasteride  5 mg Oral Daily  . [MAR Hold] fluticasone  2 spray Each Nare Daily  . [MAR Hold] insulin aspart  0-5 Units Subcutaneous QHS  . [MAR Hold] insulin aspart  0-9 Units Subcutaneous TID WC  . [MAR Hold] metoprolol tartrate  50 mg Oral BID  . [MAR Hold] mometasone-formoterol  2 puff Inhalation BID  . [MAR Hold] montelukast  10 mg Oral Daily  . [MAR Hold] pravastatin  20 mg Oral q1800  . [MAR Hold] sodium chloride flush  3 mL Intravenous Q12H  . [facility] Hold] sodium chloride flush  3 mL Intravenous Q12H   Infusions:  . sodium chloride      Labs:  Recent Labs  01/25/2016 2010 02/12/16 0159  NA 132* 134*  K 4.8 4.3  CL 105 106  CO2 20* 22  GLUCOSE 126* 172*  BUN 78* 77*  CREATININE 5.10* 4.94*  CALCIUM 7.9* 7.8*  MG 2.6*  --     Recent Labs  02/06/2016 1117  AST 36  ALT 29  ALKPHOS 61  BILITOT 0.7  PROT 7.2  ALBUMIN 2.7*    Recent Labs  01/25/2016 1117 02/12/16 0159  WBC 6.5 5.6  NEUTROABS 5.3  --   HGB 9.7* 8.8*  HCT 28.2* 25.4*  MCV 82.5 83.0  PLT 313 285  Recent Labs  02/01/2016 1117 02/02/2016 2010 02/12/16 0159  TROPONINI 0.06* 0.06* 0.06*   Invalid input(s): POCBNP  Recent Labs  02/09/2016 2010  HGBA1C 5.5     Weights: [facility] Weights   02/02/2016 2038 02/15/2016 0728  Weight: 56.6 kg (124 lb 12.8 oz) 56.2 kg (124 lb)     Radiology/Studies:  Dg Chest 2 View  Result Date: 01/31/2016 CLINICAL DATA:  Shortness of breath EXAM: CHEST  2 VIEW COMPARISON:  12/26/2015 FINDINGS: Chronic cardiomegaly. There is basilar and right middle lobe patchy opacity with trace effusions. When accounting for rotation no convincing vascular pedicle distention. No Kerley lines or pneumothorax. Cholecystectomy clips.  IMPRESSION: 1. Patchy densities at the bases and right middle lobe persists. This could reflect recurrent pneumonia or treatment failure. Followup PA and lateral chest X-ray is recommended in 3-4 weeks following any therapy to ensure resolution and establish baseline. 2. Cardiomegaly and trace pleural effusions.  No pulmonary edema. Electronically Signed   By: Monte Fantasia M.D.   On: 02/14/2016 11:51     Assessment and Recommendation  83 y.o. female with the essential hypertension mixed hyperlipidemia with acute onset of the bronchitis pneumonia causing hypoxia and other shortness of breath exacerbating atrial fibrillation to now new onset atrial flutter with rapid ventricular rate status post electrical cardioversion without complication and feeling better 1. Continue current medical regimen for heart rate control and reduction of recurrence of atrial flutter with rapid ventricular rate 2. No further intervention of minimal elevation of troponin consistent with demand ischemia and hypoxia 3. Continue anticoagulation reinstatement of warfarin when INR reducing into the 2-3 range 4. No further cardiac diagnostics necessary at this time 5. Ambulation after recovery from bronchitis and possible discharge to home with follow-up next week for further adjustments as an outpatient  Signed, Serafina Royals M.D. FACC

## 2016-02-13 NOTE — Consult Note (Signed)
Subjective:  Patient underwent cardioversion this morning Still feels slight shortness of breath but overall improved Serum creatinine slightly high at 5.45, sodium worse at 126    Weight trends: [facility] Weights   02/08/2016 2038 02/10/2016 0728  Weight: 56.6 kg (124 lb 12.8 oz) 56.2 kg (124 lb)    Physical Exam: General:  well-appearing,   HEENT Anicteric, moist oral mucous membranes  Neck:  supple, no masses  Lungs: bilateral crackles, no wheezing today  Heart:: irregular rate and rhythm  Abdomen: Soft, nontender, nondistended  Extremities:  2+ pitting edema bilaterally, L > Rt  Neurologic: Alert, oriented, no focal deficits  Skin: Warm, dry, normal turgor             Lab results: Basic Metabolic Panel:  Recent Labs Lab 01/27/2016 2010 02/12/16 0159 02/10/2016 1532  NA 132* 134* 126*  K 4.8 4.3 4.8  CL 105 106 100*  CO2 20* 22 19*  GLUCOSE 126* 172* 146*  BUN 78* 77* 79*  CREATININE 5.10* 4.94* 5.45*  CALCIUM 7.9* 7.8* 7.6*  MG 2.6*  --   --     Liver Function Tests:  Recent Labs Lab 02/10/2016 1117  AST 36  ALT 29  ALKPHOS 61  BILITOT 0.7  PROT 7.2  ALBUMIN 2.7*   No results for input(s): LIPASE, AMYLASE in the last 168 hours. No results for input(s): AMMONIA in the last 168 hours.  CBC:  Recent Labs Lab 01/28/2016 1117 02/12/16 0159 01/25/2016 1532  WBC 6.5 5.6 6.9  NEUTROABS 5.3  --   --   HGB 9.7* 8.8* 9.7*  HCT 28.2* 25.4* 28.7*  MCV 82.5 83.0 83.9  PLT 313 285 360    Cardiac Enzymes:  Recent Labs Lab 02/12/16 0159  TROPONINI 0.06*    BNP: Invalid input(s): POCBNP  CBG:  Recent Labs Lab 02/12/16 1641 02/12/16 2135 02/15/2016 0853 02/12/2016 1111 02/11/2016 1613  GLUCAP 113* 140* 132* 156* 135*    Microbiology: Recent Results (from the past 720 hour(s))  Blood culture (routine x 2)     Status: None (Preliminary result)   Collection Time: 01/20/2016 12:31 PM  Result Value Ref Range Status   Specimen Description BLOOD RIGHT AC   Final   Special Requests   Final    BOTTLES DRAWN AEROBIC AND ANAEROBIC AER 11ML ANA 8ML   Culture  Setup Time   Final    Organism ID to follow GRAM POSITIVE COCCI AEROBIC BOTTLE ONLY CRITICAL RESULT CALLED TO, READ BACK BY AND VERIFIED WITH:  KAREN HAYES AT [id number] 02/08/2016 SDR    Culture GRAM POSITIVE COCCI  Final   Report Status PENDING  Incomplete  Blood Culture ID Panel (Reflexed)     Status: None   Collection Time: 02/06/2016 12:31 PM  Result Value Ref Range Status   Enterococcus species NOT DETECTED NOT DETECTED Final   Listeria monocytogenes NOT DETECTED NOT DETECTED Final   Staphylococcus species NOT DETECTED NOT DETECTED Final   Staphylococcus aureus NOT DETECTED NOT DETECTED Final   Streptococcus species NOT DETECTED NOT DETECTED Final   Streptococcus agalactiae NOT DETECTED NOT DETECTED Final   Streptococcus pneumoniae NOT DETECTED NOT DETECTED Final   Streptococcus pyogenes NOT DETECTED NOT DETECTED Final   Acinetobacter baumannii NOT DETECTED NOT DETECTED Final   Enterobacteriaceae species NOT DETECTED NOT DETECTED Final   Enterobacter cloacae complex NOT DETECTED NOT DETECTED Final   Escherichia coli NOT DETECTED NOT DETECTED Final   Klebsiella oxytoca NOT DETECTED NOT DETECTED Final  Klebsiella pneumoniae NOT DETECTED NOT DETECTED Final   Proteus species NOT DETECTED NOT DETECTED Final   Serratia marcescens NOT DETECTED NOT DETECTED Final   Haemophilus influenzae NOT DETECTED NOT DETECTED Final   Neisseria meningitidis NOT DETECTED NOT DETECTED Final   Pseudomonas aeruginosa NOT DETECTED NOT DETECTED Final   Candida albicans NOT DETECTED NOT DETECTED Final   Candida glabrata NOT DETECTED NOT DETECTED Final   Candida krusei NOT DETECTED NOT DETECTED Final   Candida parapsilosis NOT DETECTED NOT DETECTED Final   Candida tropicalis NOT DETECTED NOT DETECTED Final  Blood culture (routine x 2)     Status: None (Preliminary result)   Collection Time: 01/20/2016 12:32 PM   Result Value Ref Range Status   Specimen Description BLOOD LEFT HAND  Final   Special Requests   Final    BOTTLES DRAWN AEROBIC AND ANAEROBIC AER 13ML ANA 13ML   Culture NO GROWTH 2 DAYS  Final   Report Status PENDING  Incomplete  MRSA PCR Screening     Status: None   Collection Time: 01/22/2016 10:32 PM  Result Value Ref Range Status   MRSA by PCR NEGATIVE NEGATIVE Final    Comment:        The GeneXpert MRSA Assay (FDA approved for NASAL specimens only), is one component of a comprehensive MRSA colonization surveillance program. It is not intended to diagnose MRSA infection nor to guide or monitor treatment for MRSA infections.   Culture, expectorated sputum-assessment     Status: None   Collection Time: 01/30/2016  3:24 PM  Result Value Ref Range Status   Specimen Description SPUTUM  Final   Special Requests Normal  Final   Sputum evaluation   Final    Sputum specimen not acceptable for testing.  Please recollect.   RESULT CALLED TO, READ BACK BY AND VERIFIED WITH: Champ Mungo 01/26/2016 1608 KLW    Report Status 01/30/2016 FINAL  Final     Coagulation Studies:  Recent Labs  01/22/2016 1117 02/12/16 0159 02/15/2016 0603  LABPROT 54.9* 55.5* 42.0*  INR 5.94* 6.02* 4.25*    Urinalysis:  Recent Labs  02/06/2016 1428  COLORURINE YELLOW*  LABSPEC 1.010  PHURINE 5.0  GLUCOSEU NEGATIVE  HGBUR SMALL*  BILIRUBINUR NEGATIVE  KETONESUR NEGATIVE  PROTEINUR >=300*  NITRITE NEGATIVE  LEUKOCYTESUR SMALL*        Imaging: No results found.   Assessment & Plan: Pt is a 83 y.o. 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 Acute renal failure on chronic kidney disease st 4  versus progression to chronic kidney disease stage V Worsening of renal function is  likely secondary to abnormal cardiorenal hemodynamics caused by arrhythmias.   Patient is getting antiarrhythmics to control her heart rhythm Hopefully, with improved hemodynamics, would lead to improve renal function Electrolytes and volume status is acceptable.  No acute indication for dialysis  2.Shortness of breath, Pneumonia versus arrhythmia versus pulmonary edema Evaluation is in progress Now that blood pressure has improved, will consider starting Lasix in the morning for volume control   3.  Lower extremity edema Low-salt diet  4. Hyperkinemia Improved with Kayexalate

## 2016-02-13 NOTE — Consult Note (Signed)
[facility] Infectious Disease     Reason for Consult: ABX management     Referring Physician: Bettey Costa Date of Admission:  02/09/2016   Active Problems:   ARF (acute renal failure) ([place])   HPI: Joan Erickson is a 83 y.o. female admitted with SOB and palps. Recent otpt treatment for PNA.  Seen by PCP and had A fib with RVR as well as ARF. On admit wbc 6.5, no fevers. CXR showed patchy RML infiltrates not improved from prior. UA 6-30 wbc, BCX 1/2 with GPC (not ided on [employer]). PC 0.38.   Past Medical History:  Diagnosis Date  . Asthma   . Atrial fibrillation ([place])   . GERD (gastroesophageal reflux disease)   . History of colon polyps   . Hypercholesterolemia   . Hypertension   . Hypogammaglobulinemia ([facility])    IgA  . Warfarin anticoagulation   . Xanthogranulomatous pyelonephritis    s/p right nephrectomy   Past Surgical History:  Procedure Laterality Date  . ABDOMINAL HYSTERECTOMY     ovaries not removed  . CESAREAN SECTION    . NEPHRECTOMY     right - for xanthogranulomatous pyelonephritis   Social History  Substance Use Topics  . Smoking status: Never Smoker  . Smokeless tobacco: Never Used  . Alcohol use No   Family History  Problem Relation Age of Onset  . Brain cancer Mother   . Liver cancer Father     questionabl colon cancer  . Breast cancer Neg Hx     Allergies: No Known Allergies  Current antibiotics: Antibiotics Given (last 72 hours)    Date/Time Action Medication Dose Rate   02/17/2016 1302 Given   cefTRIAXone (ROCEPHIN) IVPB 1 g 1 g 100 mL/hr   02/06/2016 2223 Given   vancomycin (VANCOCIN) IVPB 750 mg/150 ml premix 750 mg 150 mL/hr   02/04/2016 2343 Given   ceFEPIme (MAXIPIME) 1 GM / 64m IVPB premix 1 g 100 mL/hr      MEDICATIONS: . citalopram  10 mg Oral Daily  . diltiazem  360 mg Oral Daily  . finasteride  5 mg Oral Daily  . fluticasone  2 spray Each Nare Daily  . insulin aspart  0-5 Units Subcutaneous QHS  . insulin aspart  0-9 Units  Subcutaneous TID WC  . mometasone-formoterol  2 puff Inhalation BID  . montelukast  10 mg Oral Daily  . pravastatin  20 mg Oral q1800  . sodium chloride flush  3 mL Intravenous Q12H    Review of Systems - 11 systems reviewed and negative per HPI   OBJECTIVE: Temp:  [97.6 F (36.4 C)-98.4 F (36.9 C)] 98.4 F (36.9 C) (01/25 1113) Pulse Rate:  [44-140] 44 (01/25 1119) Resp:  [16-42] 16 (01/25 1113) BP: (100-191)/(59-170) 124/67 (01/25 1119) SpO2:  [64 %-95 %] 64 % (01/25 1119) FiO2 (%):  [21 %] 21 % (01/25 0921) Weight:  [56.2 kg (124 lb)] 56.2 kg (124 lb) (01/25 0728) Physical Exam  Constitutional:  oriented to person, place, and time. appears well-developed and well-nourished. No distress.  HENT: [place]/AT, PERRLA, no scleral icterus Mouth/Throat: Oropharynx is clear and moist. No oropharyngeal exudate.  Cardiovascular: Normal rate, regular rhythm and normal heart sounds.  Pulmonary/Chest: bil wheese and rhonchi Neck = supple, no nuchal rigidity Abdominal: Soft. Bowel sounds are normal.  exhibits no distension. There is no tenderness.  Lymphadenopathy: no cervical adenopathy. No axillary adenopathy Neurological: alert and oriented to person, place, and time.  Skin: Skin is warm  and dry. No rash noted. No erythema.  Ext 2+ edema Psychiatric: a normal mood and affect.  behavior is normal.   LABS: Results for orders placed or performed during the hospital encounter of 01/24/2016 (from the past 48 hour(s))  Blood culture (routine x 2)     Status: None (Preliminary result)   Collection Time: 02/07/2016 12:31 PM  Result Value Ref Range   Specimen Description BLOOD RIGHT AC    Special Requests      BOTTLES DRAWN AEROBIC AND ANAEROBIC AER 11ML ANA 8ML   Culture  Setup Time      Organism ID to follow [place] CRITICAL RESULT CALLED TO, READ BACK BY AND VERIFIED WITH:  KAREN HAYES AT 4562 02/07/2016 SDR    Culture GRAM POSITIVE COCCI    Report Status  PENDING   Blood Culture ID Panel (Reflexed)     Status: None   Collection Time: 02/17/2016 12:31 PM  Result Value Ref Range   Enterococcus species NOT DETECTED NOT DETECTED   Listeria monocytogenes NOT DETECTED NOT DETECTED   Staphylococcus species NOT DETECTED NOT DETECTED   Staphylococcus aureus NOT DETECTED NOT DETECTED   Streptococcus species NOT DETECTED NOT DETECTED   Streptococcus agalactiae NOT DETECTED NOT DETECTED   Streptococcus pneumoniae NOT DETECTED NOT DETECTED   Streptococcus pyogenes NOT DETECTED NOT DETECTED   Acinetobacter baumannii NOT DETECTED NOT DETECTED   Enterobacteriaceae species NOT DETECTED NOT DETECTED   Enterobacter cloacae complex NOT DETECTED NOT DETECTED   Escherichia coli NOT DETECTED NOT DETECTED   Klebsiella oxytoca NOT DETECTED NOT DETECTED   Klebsiella pneumoniae NOT DETECTED NOT DETECTED   Proteus species NOT DETECTED NOT DETECTED   Serratia marcescens NOT DETECTED NOT DETECTED   Haemophilus influenzae NOT DETECTED NOT DETECTED   Neisseria meningitidis NOT DETECTED NOT DETECTED   Pseudomonas aeruginosa NOT DETECTED NOT DETECTED   Candida albicans NOT DETECTED NOT DETECTED   Candida glabrata NOT DETECTED NOT DETECTED   Candida krusei NOT DETECTED NOT DETECTED   Candida parapsilosis NOT DETECTED NOT DETECTED   Candida tropicalis NOT DETECTED NOT DETECTED  Blood culture (routine x 2)     Status: None (Preliminary result)   Collection Time: 02/10/2016 12:32 PM  Result Value Ref Range   Specimen Description BLOOD LEFT HAND    Special Requests      BOTTLES DRAWN AEROBIC AND ANAEROBIC AER 13ML ANA 13ML   Culture NO GROWTH 2 DAYS    Report Status PENDING   Lactic acid, plasma     Status: None   Collection Time: 02/07/2016 12:32 PM  Result Value Ref Range   Lactic Acid, Venous 0.8 0.5 - 1.9 mmol/L  Urinalysis, Routine w reflex microscopic     Status: Abnormal   Collection Time: 01/20/2016  2:28 PM  Result Value Ref Range   Color, Urine YELLOW (A)  YELLOW   APPearance HAZY (A) CLEAR   Specific Gravity, Urine 1.010 1.005 - 1.030   pH 5.0 5.0 - 8.0   Glucose, UA NEGATIVE NEGATIVE mg/dL   Hgb urine dipstick SMALL (A) NEGATIVE   Bilirubin Urine NEGATIVE NEGATIVE   Ketones, ur NEGATIVE NEGATIVE mg/dL   Protein, ur >=300 (A) NEGATIVE mg/dL   Nitrite NEGATIVE NEGATIVE   Leukocytes, UA SMALL (A) NEGATIVE   RBC / HPF 0-5 0 - 5 RBC/hpf   WBC, UA 6-30 0 - 5 WBC/hpf   Bacteria, UA MANY (A) NONE SEEN   Squamous Epithelial / LPF NONE SEEN NONE  SEEN   WBC Clumps PRESENT   Magnesium     Status: Abnormal   Collection Time: 02/10/2016  8:10 PM  Result Value Ref Range   Magnesium 2.6 (H) 1.7 - 2.4 mg/dL  Procalcitonin     Status: None   Collection Time: 02/12/2016  8:10 PM  Result Value Ref Range   Procalcitonin 0.38 ng/mL    Comment:        Interpretation: PCT (Procalcitonin) <= 0.5 ng/mL: Systemic infection (sepsis) is not likely. Local bacterial infection is possible. (NOTE)         ICU PCT Algorithm               Non ICU PCT Algorithm    ----------------------------     ------------------------------         PCT < 0.25 ng/mL                 PCT < 0.1 ng/mL     Stopping of antibiotics            Stopping of antibiotics       strongly encouraged.               strongly encouraged.    ----------------------------     ------------------------------       PCT level decrease by               PCT < 0.25 ng/mL       >= 80% from peak PCT       OR PCT 0.25 - 0.5 ng/mL          Stopping of antibiotics                                             encouraged.     Stopping of antibiotics           encouraged.    ----------------------------     ------------------------------       PCT level decrease by              PCT >= 0.25 ng/mL       < 80% from peak PCT        AND PCT >= 0.5 ng/mL            Continuin g antibiotics                                              encouraged.       Continuing antibiotics            encouraged.     ----------------------------     ------------------------------     PCT level increase compared          PCT > 0.5 ng/mL         with peak PCT AND          PCT >= 0.5 ng/mL             Escalation of antibiotics                                          strongly encouraged.  Escalation of antibiotics        strongly encouraged.   Basic metabolic panel     Status: Abnormal   Collection Time: 01/30/2016  8:10 PM  Result Value Ref Range   Sodium 132 (L) 135 - 145 mmol/L   Potassium 4.8 3.5 - 5.1 mmol/L   Chloride 105 101 - 111 mmol/L   CO2 20 (L) 22 - 32 mmol/L   Glucose, Bld 126 (H) 65 - 99 mg/dL   BUN 78 (H) 6 - 20 mg/dL   Creatinine, Ser 5.10 (H) 0.44 - 1.00 mg/dL   Calcium 7.9 (L) 8.9 - 10.3 mg/dL   GFR calc non Af Amer 7 (L) >60 mL/min   GFR calc Af Amer 8 (L) >60 mL/min    Comment: (NOTE) The eGFR has been calculated using the CKD EPI equation. This calculation has not been validated in all clinical situations. eGFR's persistently <60 mL/min signify possible Chronic Kidney Disease.    Anion gap 7 5 - 15  Troponin I (q 6hr x 3)     Status: Abnormal   Collection Time: 02/16/2016  8:10 PM  Result Value Ref Range   Troponin I 0.06 (HH) <0.03 ng/mL    Comment: CRITICAL VALUE NOTED. VALUE IS CONSISTENT WITH PREVIOUSLY REPORTED/CALLED VALUE SNJ  Hemoglobin A1c     Status: None   Collection Time: 01/23/2016  8:10 PM  Result Value Ref Range   Hgb A1c MFr Bld 5.5 4.8 - 5.6 %    Comment: (NOTE)         Pre-diabetes: 5.7 - 6.4         Diabetes: >6.4         Glycemic control for adults with diabetes: <7.0    Mean Plasma Glucose 111 mg/dL    Comment: (NOTE) Performed At: [facility] [address] [place], [place] [id number] Lindon Romp MD KX:[id number]   Glucose, capillary     Status: Abnormal   Collection Time: 02/10/2016  9:23 PM  Result Value Ref Range   Glucose-Capillary 100 (H) 65 - 99 mg/dL  MRSA PCR Screening     Status: None   Collection Time: 02/03/2016 10:32  PM  Result Value Ref Range   MRSA by PCR NEGATIVE NEGATIVE    Comment:        The GeneXpert MRSA Assay (FDA approved for NASAL specimens only), is one component of a comprehensive MRSA colonization surveillance program. It is not intended to diagnose MRSA infection nor to guide or monitor treatment for MRSA infections.   Basic metabolic panel     Status: Abnormal   Collection Time: 02/12/16  1:59 AM  Result Value Ref Range   Sodium 134 (L) 135 - 145 mmol/L   Potassium 4.3 3.5 - 5.1 mmol/L   Chloride 106 101 - 111 mmol/L   CO2 22 22 - 32 mmol/L   Glucose, Bld 172 (H) 65 - 99 mg/dL   BUN 77 (H) 6 - 20 mg/dL   Creatinine, Ser 4.94 (H) 0.44 - 1.00 mg/dL   Calcium 7.8 (L) 8.9 - 10.3 mg/dL   GFR calc non Af Amer 7 (L) >60 mL/min   GFR calc Af Amer 9 (L) >60 mL/min    Comment: (NOTE) The eGFR has been calculated using the CKD EPI equation. This calculation has not been validated in all clinical situations. eGFR's persistently <60 mL/min signify possible Chronic Kidney Disease.    Anion gap 6 5 - 15  CBC  Status: Abnormal   Collection Time: 02/12/16  1:59 AM  Result Value Ref Range   WBC 5.6 3.6 - 11.0 K/uL   RBC 3.06 (L) 3.80 - 5.20 MIL/uL   Hemoglobin 8.8 (L) 12.0 - 16.0 g/dL   HCT 25.4 (L) 35.0 - 47.0 %   MCV 83.0 80.0 - 100.0 fL   MCH 28.8 26.0 - 34.0 pg   MCHC 34.6 32.0 - 36.0 g/dL   RDW 17.0 (H) 11.5 - 14.5 %   Platelets 285 150 - 440 K/uL  Protime-INR     Status: Abnormal   Collection Time: 02/12/16  1:59 AM  Result Value Ref Range   Prothrombin Time 55.5 (H) 11.4 - 15.2 seconds   INR 6.02 (HH)     Comment: CRITICAL RESULT CALLED TO, READ BACK BY AND VERIFIED WITH: LEXI MILLER @ 4680 ON 02/12/2016 BY CAF   APTT     Status: Abnormal   Collection Time: 02/12/16  1:59 AM  Result Value Ref Range   aPTT 67 (H) 24 - 36 seconds    Comment:        IF BASELINE aPTT IS ELEVATED, SUGGEST PATIENT RISK ASSESSMENT BE USED TO DETERMINE APPROPRIATE ANTICOAGULANT  THERAPY.   Troponin I (q 6hr x 3)     Status: Abnormal   Collection Time: 02/12/16  1:59 AM  Result Value Ref Range   Troponin I 0.06 (HH) <0.03 ng/mL    Comment: CRITICAL VALUE NOTED. VALUE IS CONSISTENT WITH PREVIOUSLY REPORTED/CALLED VALUE TLB   Glucose, capillary     Status: Abnormal   Collection Time: 02/12/16  7:19 AM  Result Value Ref Range   Glucose-Capillary 141 (H) 65 - 99 mg/dL  Glucose, capillary     Status: Abnormal   Collection Time: 02/12/16 11:53 AM  Result Value Ref Range   Glucose-Capillary 145 (H) 65 - 99 mg/dL  Glucose, capillary     Status: Abnormal   Collection Time: 02/12/16  4:41 PM  Result Value Ref Range   Glucose-Capillary 113 (H) 65 - 99 mg/dL  Glucose, capillary     Status: Abnormal   Collection Time: 02/12/16  9:35 PM  Result Value Ref Range   Glucose-Capillary 140 (H) 65 - 99 mg/dL   Comment 1 Notify RN   Protime-INR     Status: Abnormal   Collection Time: 02/09/2016  6:03 AM  Result Value Ref Range   Prothrombin Time 42.0 (H) 11.4 - 15.2 seconds   INR 4.25 (HH)     Comment: RESULT REPEATED AND VERIFIED CRITICAL RESULT CALLED TO, READ BACK BY AND VERIFIED WITH:  MARCELLA TURNER AT 0725 02/10/2016 SDR   Glucose, capillary     Status: Abnormal   Collection Time: 02/07/2016  8:53 AM  Result Value Ref Range   Glucose-Capillary 132 (H) 65 - 99 mg/dL  Glucose, capillary     Status: Abnormal   Collection Time: 02/12/2016 11:11 AM  Result Value Ref Range   Glucose-Capillary 156 (H) 65 - 99 mg/dL   Comment 1 Notify RN    Comment 2 Document in Chart    No components found for: ESR, C REACTIVE PROTEIN MICRO: Recent Results (from the past 720 hour(s))  Blood culture (routine x 2)     Status: None (Preliminary result)   Collection Time: 01/25/2016 12:31 PM  Result Value Ref Range Status   Specimen Description BLOOD RIGHT AC  Final   Special Requests   Final    BOTTLES DRAWN AEROBIC AND ANAEROBIC AER 11ML  ANA 8ML   Culture  Setup Time   Final    Organism  ID to follow GRAM POSITIVE COCCI AEROBIC BOTTLE ONLY CRITICAL RESULT CALLED TO, READ BACK BY AND VERIFIED WITH:  KAREN HAYES AT 3267 02/08/2016 SDR    Culture GRAM POSITIVE COCCI  Final   Report Status PENDING  Incomplete  Blood Culture ID Panel (Reflexed)     Status: None   Collection Time: 02/12/2016 12:31 PM  Result Value Ref Range Status   Enterococcus species NOT DETECTED NOT DETECTED Final   Listeria monocytogenes NOT DETECTED NOT DETECTED Final   Staphylococcus species NOT DETECTED NOT DETECTED Final   Staphylococcus aureus NOT DETECTED NOT DETECTED Final   Streptococcus species NOT DETECTED NOT DETECTED Final   Streptococcus agalactiae NOT DETECTED NOT DETECTED Final   Streptococcus pneumoniae NOT DETECTED NOT DETECTED Final   Streptococcus pyogenes NOT DETECTED NOT DETECTED Final   Acinetobacter baumannii NOT DETECTED NOT DETECTED Final   Enterobacteriaceae species NOT DETECTED NOT DETECTED Final   Enterobacter cloacae complex NOT DETECTED NOT DETECTED Final   Escherichia coli NOT DETECTED NOT DETECTED Final   Klebsiella oxytoca NOT DETECTED NOT DETECTED Final   Klebsiella pneumoniae NOT DETECTED NOT DETECTED Final   Proteus species NOT DETECTED NOT DETECTED Final   Serratia marcescens NOT DETECTED NOT DETECTED Final   Haemophilus influenzae NOT DETECTED NOT DETECTED Final   Neisseria meningitidis NOT DETECTED NOT DETECTED Final   Pseudomonas aeruginosa NOT DETECTED NOT DETECTED Final   Candida albicans NOT DETECTED NOT DETECTED Final   Candida glabrata NOT DETECTED NOT DETECTED Final   Candida krusei NOT DETECTED NOT DETECTED Final   Candida parapsilosis NOT DETECTED NOT DETECTED Final   Candida tropicalis NOT DETECTED NOT DETECTED Final  Blood culture (routine x 2)     Status: None (Preliminary result)   Collection Time: 02/17/2016 12:32 PM  Result Value Ref Range Status   Specimen Description BLOOD LEFT HAND  Final   Special Requests   Final    BOTTLES DRAWN AEROBIC AND  ANAEROBIC AER 13ML ANA 13ML   Culture NO GROWTH 2 DAYS  Final   Report Status PENDING  Incomplete  MRSA PCR Screening     Status: None   Collection Time: 02/10/2016 10:32 PM  Result Value Ref Range Status   MRSA by PCR NEGATIVE NEGATIVE Final    Comment:        The GeneXpert MRSA Assay (FDA approved for NASAL specimens only), is one component of a comprehensive MRSA colonization surveillance program. It is not intended to diagnose MRSA infection nor to guide or monitor treatment for MRSA infections.     IMAGING: Dg Chest 2 View  Result Date: 01/24/2016 CLINICAL DATA:  Shortness of breath EXAM: CHEST  2 VIEW COMPARISON:  12/26/2015 FINDINGS: Chronic cardiomegaly. There is basilar and right middle lobe patchy opacity with trace effusions. When accounting for rotation no convincing vascular pedicle distention. No Kerley lines or pneumothorax. Cholecystectomy clips. IMPRESSION: 1. Patchy densities at the bases and right middle lobe persists. This could reflect recurrent pneumonia or treatment failure. Followup PA and lateral chest X-ray is recommended in 3-4 weeks following any therapy to ensure resolution and establish baseline. 2. Cardiomegaly and trace pleural effusions.  No pulmonary edema. Electronically Signed   By: Monte Fantasia M.D.   On: 01/24/2016 11:51    Assessment:   POPPI SCANTLING is a 83 y.o. female admitted with Afib RVR, Acute renal failure and PNA. No fevers, wbc nml.  [place] 1/2 GPC not identified by [employer].  Started on Ctx, azitho and vanco.   Given ARF had temp cath placed today.  I suspcet most of her sxs are from volume overload. BCx likely contaminant.   Recommendations Contineu to follow off abx Follow up bcx If develops fevers chills, check sputum cx and consider CT chest Thank you very much for allowing me to participate in the care of this patient. Please call with questions.   Cheral Marker. Ola Spurr, MD

## 2016-02-13 NOTE — Progress Notes (Signed)
Patient was in sustained HR in the 120-140. Prn IV metop administered twice. Patient remained in afib/aflutter for most of the night.  0704: Patient's consent for cardioversion was obtained.  0720: Patient was transported for of the unit for cardioversion

## 2016-02-13 NOTE — Anesthesia Procedure Notes (Signed)
Date/Time: 02/04/2016 7:23 AM Performed by: Nelda Marseille Pre-anesthesia Checklist: Patient identified, Emergency Drugs available, Suction available, Patient being monitored and Timeout performed Oxygen Delivery Method: Nasal cannula

## 2016-02-13 NOTE — Progress Notes (Signed)
PHARMACY - PHYSICIAN COMMUNICATION CRITICAL VALUE ALERT - BLOOD CULTURE IDENTIFICATION (BCID)  Results for orders placed or performed during the hospital encounter of 02/15/2016  Blood Culture ID Panel (Reflexed) (Collected: 02/07/2016 12:31 PM)  Result Value Ref Range   Enterococcus species NOT DETECTED NOT DETECTED   Listeria monocytogenes NOT DETECTED NOT DETECTED   Staphylococcus species NOT DETECTED NOT DETECTED   Staphylococcus aureus NOT DETECTED NOT DETECTED   Streptococcus species NOT DETECTED NOT DETECTED   Streptococcus agalactiae NOT DETECTED NOT DETECTED   Streptococcus pneumoniae NOT DETECTED NOT DETECTED   Streptococcus pyogenes NOT DETECTED NOT DETECTED   Acinetobacter baumannii NOT DETECTED NOT DETECTED   Enterobacteriaceae species NOT DETECTED NOT DETECTED   Enterobacter cloacae complex NOT DETECTED NOT DETECTED   Escherichia coli NOT DETECTED NOT DETECTED   Klebsiella oxytoca NOT DETECTED NOT DETECTED   Klebsiella pneumoniae NOT DETECTED NOT DETECTED   Proteus species NOT DETECTED NOT DETECTED   Serratia marcescens NOT DETECTED NOT DETECTED   Haemophilus influenzae NOT DETECTED NOT DETECTED   Neisseria meningitidis NOT DETECTED NOT DETECTED   Pseudomonas aeruginosa NOT DETECTED NOT DETECTED   Candida albicans NOT DETECTED NOT DETECTED   Candida glabrata NOT DETECTED NOT DETECTED   Candida krusei NOT DETECTED NOT DETECTED   Candida parapsilosis NOT DETECTED NOT DETECTED   Candida tropicalis NOT DETECTED NOT DETECTED    Name of physician (or Provider) Contacted: Manuella Ghazi  Changes to prescribed antibiotics required: No antimicrobials at this time. Will follow.  Lahoma Constantin D 01/24/2016  11:10 AM

## 2016-02-13 NOTE — Progress Notes (Signed)
ID brief note Reviewed chart. Will see pt tomorrow. Check flu and sputum cx.

## 2016-02-13 NOTE — Anesthesia Post-op Follow-up Note (Cosign Needed)
Anesthesia QCDR form completed.        

## 2016-02-13 NOTE — CV Procedure (Signed)
Electrical Cardioversion Procedure Note SAMUEL WILDER HO:[id number] 02-09-1931  Procedure: Electrical Cardioversion Indications:  Atrial Flutter  Procedure Details Consent: Risks of procedure as well as the alternatives and risks of each were explained to the (patient/caregiver).  Consent for procedure obtained. Time Out: Verified patient identification, verified procedure, site/side was marked, verified correct patient position, special equipment/implants available, medications/allergies/relevent history reviewed, required imaging and test results available.  Performed  Patient placed on cardiac monitor, pulse oximetry, supplemental oxygen as necessary.  Sedation given: Benzodiazepines and Short-acting barbiturates Pacer pads placed anterior and posterior chest.  Cardioverted 1 time(s).  Cardioverted at 120J.  Evaluation Findings: Post procedure EKG shows: NSR Complications: None Patient did tolerate procedure well.   Corey Skains 01/23/2016, 8:23 AM

## 2016-02-13 NOTE — Transfer of Care (Signed)
Immediate Anesthesia Transfer of Care Note  Patient: Joan Erickson  Procedure(s) Performed: Procedure(s): CARDIOVERSION (N/A)  Patient Location: PACU  Anesthesia Type:General  Level of Consciousness: sedated  Airway & Oxygen Therapy: Patient Spontanous Breathing and Patient connected to nasal cannula oxygen  Post-op Assessment: Report given to RN and Post -op Vital signs reviewed and stable  Post vital signs: Reviewed and stable  Last Vitals:  Vitals:   02/01/2016 0626 01/22/2016 0728  BP: 113/73   Pulse: (!) 131 (!) 140  Resp:  (!) 31  Temp:  36.9 C    Last Pain:  Vitals:   02/04/2016 0728  TempSrc: Oral         Complications: No apparent anesthesia complications

## 2016-02-13 NOTE — Care Management (Signed)
Patient had successful electrical cardioversion today. Coumadin currently on hold due to INR 4.25.  ID consult for antibiotic management for her persistent pneumonia.

## 2016-02-13 NOTE — Progress Notes (Signed)
[facility] NAME: Joan Erickson    MR#:  [id number]  DATE OF BIRTH:  Dec 12, 1931  SUBJECTIVE:  Feels better, Na worsened, Creat worsening as well. Family at bedside REVIEW OF SYSTEMS:   Review of Systems  Constitutional: Negative for chills, fever and weight loss.  HENT: Negative for ear discharge, ear pain and nosebleeds.   Eyes: Negative for blurred vision, pain and discharge.  Respiratory: Positive for shortness of breath. Negative for sputum production, wheezing and stridor.   Cardiovascular: Negative for chest pain, palpitations, orthopnea and PND.  Gastrointestinal: Negative for abdominal pain, diarrhea, nausea and vomiting.  Genitourinary: Negative for frequency and urgency.  Musculoskeletal: Negative for back pain and joint pain.  Neurological: Positive for weakness. Negative for sensory change, speech change and focal weakness.  Psychiatric/Behavioral: Negative for depression and hallucinations. The patient is not nervous/anxious.    Tolerating Diet:yes  DRUG ALLERGIES:  No Known Allergies  VITALS:  Blood pressure 124/67, pulse (!) 44, temperature 98.4 F (36.9 C), temperature source Oral, resp. rate 16, height 5' (1.524 m), weight 56.2 kg (124 lb), SpO2 (!) 64 %.  PHYSICAL EXAMINATION:   Physical Exam  GENERAL:  83 y.o.-year-old patient lying in the bed with no acute distress.  EYES: Pupils equal, round, reactive to light and accommodation. No scleral icterus. Extraocular muscles intact.  HEENT: Head atraumatic, normocephalic. Oropharynx and nasopharynx clear.  NECK:  Supple, no jugular venous distention. No thyroid enlargement, no tenderness.  LUNGS: Normal breath sounds bilaterally, no wheezing, rales, rhonchi. No use of accessory muscles of respiration.  CARDIOVASCULAR: S1, S2 normal. No murmurs, rubs, or gallops.  ABDOMEN: Soft, nontender, nondistended. Bowel sounds present. No organomegaly or mass.   EXTREMITIES: No cyanosis, clubbing or edema b/l.    NEUROLOGIC: Cranial nerves II through XII are intact. No focal Motor or sensory deficits b/l.   PSYCHIATRIC:  patient is alert and oriented x 3.  SKIN: No obvious rash, lesion, or ulcer.   LABORATORY PANEL:  CBC  Recent Labs Lab 02/12/2016 1532  WBC 6.9  HGB 9.7*  HCT 28.7*  PLT 360    Chemistries   Recent Labs Lab 02/08/2016 1117 01/25/2016 2010  01/21/2016 1532  NA 133* 132*  < > 126*  K 5.4* 4.8  < > 4.8  CL 106 105  < > 100*  CO2 20* 20*  < > 19*  GLUCOSE 128* 126*  < > 146*  BUN 78* 78*  < > 79*  CREATININE 4.95* 5.10*  < > 5.45*  CALCIUM 8.1* 7.9*  < > 7.6*  MG  --  2.6*  --   --   AST 36  --   --   --   ALT 29  --   --   --   ALKPHOS 61  --   --   --   BILITOT 0.7  --   --   --   < > = values in this interval not displayed. Cardiac Enzymes  Recent Labs Lab 02/12/16 0159  TROPONINI 0.06*   RADIOLOGY:  No results found. ASSESSMENT AND PLAN:  Joan Erickson  is a 83 y.o. female with a known history of A. fib, hypertension, hyperlipidemia, CKD stage 4 and asthma. The patient was treated for pneumonia with antibiotics a month ago by her primary care doctor. She has been feeling increasingly weak and shortness of breath on exertion for the past few days. Started to have a palpitation  today and feels cold. Her PCP told her that her heart rate was fast and sent patient to ED. She was found A. fib with RVR at 140   * Acute on chrnicA. fib with RVR. -Continue Cardizem, hold Coumadin due to elevated INR, echocardiogram wnl -cardiology input appreciated -HR today in the 70-80's -pt feels a lot better  *Acute renal failure on CKD stage 4 and Hyperkalemia - progressing to stage 5 Hold chlorthalidone and losartan due to renal failure -Nephrology following - no acute indication of HD yet -creat 5.10--4.94->5.4  * Pneumonia - Ruled out - normal procalcitonin - ID c/s as blood c/s growing GPR - likely false + - check  influenza and sputum c/s  *Elevated troponin, due to  A. fib and renal failure, neg serial troponins and cardiology consult, hold Coumadin. INR 4.5  *Hyponatremia: Na dropped, nephro following. Mgmt per them.   *Anticoagulopathy. Hold Coumadin, follow-up INR.  *Diabetes: sliding scale. Hold Tradjenta  Case discussed with Care Management/Social Worker. Management plans discussed with the patient, family and they are in agreement.  CODE STATUS: full  DVT Prophylaxis: elevated INR, supratherapeutic INR  TOTAL TIME TAKING CARE OF THIS PATIENT: *56minutes.  >50% time spent on counselling and coordination of care  POSSIBLE D/C IN 3-4 DAYS, DEPENDING ON CLINICAL CONDITION.  Note: This dictation was prepared with Dragon dictation along with smaller phrase technology. Any transcriptional errors that result from this process are unintentional.  Max Sane M.D on 02/02/2016 at 7:20 PM  Between 7am to 6pm - Pager - [phone]  After 6pm go to [email or web address] - password [email or web address] [facility]  Office  [phone]  CC: Primary care physician; Einar Pheasant, MD

## 2016-02-14 ENCOUNTER — Encounter: Admission: EM | Disposition: E | Payer: Self-pay | Source: Home / Self Care | Attending: Internal Medicine

## 2016-02-14 DIAGNOSIS — I483 Typical atrial flutter: Secondary | ICD-10-CM

## 2016-02-14 DIAGNOSIS — R0602 Shortness of breath: Secondary | ICD-10-CM

## 2016-02-14 DIAGNOSIS — N184 Chronic kidney disease, stage 4 (severe): Secondary | ICD-10-CM

## 2016-02-14 HISTORY — PX: PERIPHERAL VASCULAR CATHETERIZATION: SHX172C

## 2016-02-14 LAB — BASIC METABOLIC PANEL
Anion gap: 10 (ref 5–15)
BUN: 80 mg/dL — AB (ref 6–20)
CHLORIDE: 97 mmol/L — AB (ref 101–111)
CO2: 18 mmol/L — AB (ref 22–32)
CREATININE: 6.1 mg/dL — AB (ref 0.44–1.00)
Calcium: 7.8 mg/dL — ABNORMAL LOW (ref 8.9–10.3)
GFR calc Af Amer: 7 mL/min — ABNORMAL LOW (ref >60)
GFR calc non Af Amer: 6 mL/min — ABNORMAL LOW (ref >60)
Glucose, Bld: 160 mg/dL — ABNORMAL HIGH (ref 65–99)
Potassium: 5.3 mmol/L — ABNORMAL HIGH (ref 3.5–5.1)
Sodium: 125 mmol/L — ABNORMAL LOW (ref 135–145)

## 2016-02-14 LAB — CBC
HCT: 29.1 % — ABNORMAL LOW (ref 35.0–47.0)
Hemoglobin: 9.5 g/dL — ABNORMAL LOW (ref 12.0–16.0)
MCH: 27.6 pg (ref 26.0–34.0)
MCHC: 32.7 g/dL (ref 32.0–36.0)
MCV: 84.5 fL (ref 80.0–100.0)
PLATELETS: 342 10*3/uL (ref 150–440)
RBC: 3.44 MIL/uL — ABNORMAL LOW (ref 3.80–5.20)
RDW: 16.1 % — AB (ref 11.5–14.5)
WBC: 9.5 10*3/uL (ref 3.6–11.0)

## 2016-02-14 LAB — PROTIME-INR
INR: 3.12
PROTHROMBIN TIME: 32.8 s — AB (ref 11.4–15.2)

## 2016-02-14 LAB — GLUCOSE, CAPILLARY
GLUCOSE-CAPILLARY: 164 mg/dL — AB (ref 65–99)
Glucose-Capillary: 147 mg/dL — ABNORMAL HIGH (ref 65–99)
Glucose-Capillary: 125 mg/dL — ABNORMAL HIGH (ref 65–99)
Glucose-Capillary: 114 mg/dL — ABNORMAL HIGH (ref 65–99)

## 2016-02-14 SURGERY — DIALYSIS/PERMA CATHETER INSERTION
Anesthesia: Moderate Sedation

## 2016-02-14 MED ORDER — WARFARIN SODIUM 1 MG PO TABS
4.0000 mg | ORAL_TABLET | Freq: Once | ORAL | Status: AC
  Administered 2016-02-14: 4 mg via ORAL
  Filled 2016-02-14: qty 4

## 2016-02-14 MED ORDER — MIDAZOLAM HCL 5 MG/5ML IJ SOLN
INTRAMUSCULAR | Status: AC
  Filled 2016-02-14: qty 5

## 2016-02-14 MED ORDER — FENTANYL CITRATE (PF) 100 MCG/2ML IJ SOLN
INTRAMUSCULAR | Status: AC
  Filled 2016-02-14: qty 2

## 2016-02-14 MED ORDER — LIDOCAINE HCL (PF) 1 % IJ SOLN
INTRAMUSCULAR | Status: DC | PRN
  Administered 2016-02-14: 5 mL via INTRADERMAL

## 2016-02-14 MED ORDER — LIDOCAINE-EPINEPHRINE (PF) 2 %-1:200000 IJ SOLN
INTRAMUSCULAR | Status: AC
  Filled 2016-02-14: qty 20

## 2016-02-14 MED ORDER — WARFARIN - PHARMACIST DOSING INPATIENT: Freq: Every day | Status: DC

## 2016-02-14 MED ORDER — TUBERCULIN PPD 5 UNIT/0.1ML ID SOLN
5.0000 [IU] | Freq: Once | INTRADERMAL | Status: AC
  Administered 2016-02-14: 5 [IU] via INTRADERMAL
  Filled 2016-02-14: qty 0.1

## 2016-02-14 MED ORDER — HEPARIN (PORCINE) IN NACL 2-0.9 UNIT/ML-% IJ SOLN
INTRAMUSCULAR | Status: AC
  Filled 2016-02-14: qty 500

## 2016-02-14 MED ORDER — HEPARIN SODIUM (PORCINE) 10000 UNIT/ML IJ SOLN
INTRAMUSCULAR | Status: AC
  Filled 2016-02-14: qty 1

## 2016-02-14 MED ORDER — LIDOCAINE HCL (PF) 1 % IJ SOLN
INTRAMUSCULAR | Status: AC
  Filled 2016-02-14: qty 30

## 2016-02-14 SURGICAL SUPPLY — 2 items
BIOPATCH WHT 1IN DISK W/4.0 H (GAUZE/BANDAGES/DRESSINGS) ×3 IMPLANT
KIT DIALYSIS CATH TRI 30X13 (CATHETERS) ×3 IMPLANT

## 2016-02-14 NOTE — Progress Notes (Signed)
Post hd tx 

## 2016-02-14 NOTE — Progress Notes (Signed)
ANTICOAGULATION CONSULT NOTE - Initial Consult  Pharmacy Consult for warfarin  Indication: atrial fibrillation   No Known Allergies  Patient Measurements: Height: 5' (152.4 cm) Weight: 124 lb (56.2 kg) IBW/kg (Calculated) : 45.5 Heparin Dosing Weight:   Vital Signs: Temp: 97.5 F (36.4 C) (01/26 1105) Temp Source: Oral (01/26 1105) BP: 129/50 (01/26 1105) Pulse Rate: 68 (01/26 1105)  Labs:  Recent Labs  01/21/2016 2010  02/12/16 0159 02/08/2016 0603 01/23/2016 1532 01/29/2016 0538 01/22/2016 1250  HGB  --   < > 8.8*  --  9.7* 9.5*  --   HCT  --   --  25.4*  --  28.7* 29.1*  --   PLT  --   --  285  --  360 342  --   APTT  --   --  67*  --   --   --   --   LABPROT  --   --  55.5* 42.0*  --   --  32.8*  INR  --   --  6.02* 4.25*  --   --  3.12  CREATININE 5.10*  --  4.94*  --  5.45* 6.10*  --   TROPONINI 0.06*  --  0.06*  --   --   --   --   < > = values in this interval not displayed.  Estimated Creatinine Clearance: 5.6 mL/min (by C-G formula based on SCr of 6.1 mg/dL (H)).   Medical History: Past Medical History:  Diagnosis Date  . Asthma   . Atrial fibrillation ([place])   . GERD (gastroesophageal reflux disease)   . History of colon polyps   . Hypercholesterolemia   . Hypertension   . Hypogammaglobulinemia ([facility])    IgA  . Warfarin anticoagulation   . Xanthogranulomatous pyelonephritis    s/p right nephrectomy    Medications:  Prescriptions Prior to Admission  Medication Sig Dispense Refill Last Dose  . acetaminophen (TYLENOL) 500 MG tablet Take 500 mg by mouth every 6 (six) hours as needed.   prn at prn  . ADVAIR DISKUS 250-50 MCG/DOSE AEPB INHALE 1 PUFF BY MOUTH TWICE A DAY 60 each 5 02/10/2016 at 2100  . chlorthalidone (HYGROTON) 25 MG tablet Take 0.5 tablets (12.5 mg total) by mouth daily. 30 tablet 2 01/27/2016 at 0900  . Cholecalciferol (VITAMIN D) 2000 UNITS tablet Take 2,000 Units by mouth daily.   01/30/2016 at 0900  . citalopram (CELEXA) 10 MG tablet Take 1  tablet (10 mg total) by mouth daily. 90 tablet 1 01/28/2016 at 0900  . diltiazem (TIAZAC) 360 MG 24 hr capsule Take 1 capsule (360 mg total) by mouth daily. 90 capsule 1 01/29/2016 at 0900  . finasteride (PROSCAR) 5 MG tablet    02/17/2016 at 0900  . fluticasone (FLONASE) 50 MCG/ACT nasal spray USE 2 SPRAYS IN EACH NOSTRIL ONCE A DAY 16 g 5 02/10/2016 at Unknown time  . losartan (COZAAR) 100 MG tablet TAKE 1 TABLET BY MOUTH ONCE A DAY (Patient taking differently: tk 0.5 t qd) 30 tablet 5 01/20/2016 at 0900  . montelukast (SINGULAIR) 10 MG tablet Take 1 tablet (10 mg total) by mouth daily. 90 tablet 1 01/26/2016 at 0900  . pravastatin (PRAVACHOL) 20 MG tablet TAKE 1 TABLET (20 MG TOTAL) BY MOUTH DAILY. 30 tablet 8 02/10/2016 at 2100  . PROAIR HFA 108 (90 Base) MCG/ACT inhaler INHALE 2 PUFF EVERY SIX HOURS AS NEEDED 8.5 Inhaler 2 02/10/2016 at 0900  . TRADJENTA 5 MG TABS  tablet TAKE 1 TABLET (5 MG TOTAL) BY MOUTH DAILY. 30 tablet 11 01/21/2016 at 0900  . warfarin (COUMADIN) 1 MG tablet Take 1 mg by mouth at bedtime. Pt takes 0.5 t in addition to the 5 mg dose  4 02/10/2016 at 2100  . warfarin (COUMADIN) 5 MG tablet Take 5 mg by mouth at bedtime. Pt takes 5 mg dose along with 0.5 of a 1 mg tab qhs   02/10/2016 at 2100  . Blood Glucose Monitoring Suppl (ONE TOUCH ULTRA SYSTEM KIT) W/DEVICE KIT 1 kit by Does not apply route once. 1 each 0 Taking  . eplerenone (INSPRA) 25 MG tablet TAKE 1 TABLET (25 MG TOTAL) BY MOUTH ONCE DAILY FOR 14 DAYS.  0 Taking  . fluticasone (FLONASE) 50 MCG/ACT nasal spray USE 2 SPRAYS IN EACH NOSTRIL ONCE A DAY 16 g 1 Taking  . ONE TOUCH ULTRA TEST test strip CHECK BLOOD SUGAR 2 TIMES A DAY DX: E11.9 100 each 3 Taking   Scheduled:  . citalopram  10 mg Oral Daily  . diltiazem  360 mg Oral Daily  . finasteride  5 mg Oral Daily  . fluticasone  2 spray Each Nare Daily  . insulin aspart  0-5 Units Subcutaneous QHS  . insulin aspart  0-9 Units Subcutaneous TID WC  . mometasone-formoterol   2 puff Inhalation BID  . montelukast  10 mg Oral Daily  . pravastatin  20 mg Oral q1800  . sodium chloride flush  3 mL Intravenous Q12H  . tuberculin  5 Units Intradermal Once  . warfarin  4 mg Oral ONCE-1800  . Warfarin - Pharmacist Dosing Inpatient   Does not apply q1800    Assessment: Pharmacy consulted to dose and monitor warfarin therapy in this 83 year old woman with atrial fibrillation. Patient was admitted with elevated INR.  Patient takes warfarin 6 mg PO daily per Med rec  Patient's INR is almost at goal range of 3; however likely will drop again tomorrow secondary to held doses during admission  Goal of Therapy:  INR 2-3 Monitor platelets by anticoagulation protocol: Yes   Plan:  Will give a reduced dose of warfarin 4 mg PO x 1. Will recheck PT/INR with am labs.   Briannie Gutierrez D 01/25/2016,2:54 PM

## 2016-02-14 NOTE — Progress Notes (Signed)
This note also relates to the following rows which could not be included: BP - Cannot attach notes to unvalidated device data  First hemodialysis treatment started without complications.

## 2016-02-14 NOTE — Progress Notes (Signed)
Pt has small amount of blood on dressing and some seepage out of the side. Dressing marked for observation and pt repositioned. Seepage not noted after repositioning. Will continue to monitor

## 2016-02-14 NOTE — Progress Notes (Signed)
Pre-hd tx 

## 2016-02-14 NOTE — Consult Note (Signed)
Subjective:  Patient continues to feel poorly. Still appears to be somewhat short of breath. Appetite remains poor.  Today she appears to be much more somnolent. Her daughter reports that she has not slept well for the past 3 days Sodium, potassium, creatinine, BUN are all worse   Weight trends: [facility] Weights   02/04/2016 2038 01/31/2016 0728  Weight: 56.6 kg (124 lb 12.8 oz) 56.2 kg (124 lb)    Physical Exam: General:  ill-appearing,   HEENT Anicteric, moist oral mucous membranes  Neck:  supple, no masses  Lungs: bilateral crackles, no wheezing today  Heart:: irregular rate and rhythm  Abdomen: Soft, nontender, nondistended  Extremities:  2+ pitting edema bilaterally, L > Rt  Neurologic: Lethargic, however, able to answer a few questions   Skin: Warm, dry, normal turgor             Lab results: Basic Metabolic Panel:  Recent Labs Lab 01/28/2016 2010 02/12/16 0159 02/15/2016 1532 02/12/2016 0538  NA 132* 134* 126* 125*  K 4.8 4.3 4.8 5.3*  CL 105 106 100* 97*  CO2 20* 22 19* 18*  GLUCOSE 126* 172* 146* 160*  BUN 78* 77* 79* 80*  CREATININE 5.10* 4.94* 5.45* 6.10*  CALCIUM 7.9* 7.8* 7.6* 7.8*  MG 2.6*  --   --   --     Liver Function Tests:  Recent Labs Lab 01/22/2016 1117  AST 36  ALT 29  ALKPHOS 61  BILITOT 0.7  PROT 7.2  ALBUMIN 2.7*   No results for input(s): LIPASE, AMYLASE in the last 168 hours. No results for input(s): AMMONIA in the last 168 hours.  CBC:  Recent Labs Lab 01/31/2016 1117  01/21/2016 1532 02/15/2016 0538  WBC 6.5  < > 6.9 9.5  NEUTROABS 5.3  --   --   --   HGB 9.7*  < > 9.7* 9.5*  HCT 28.2*  < > 28.7* 29.1*  MCV 82.5  < > 83.9 84.5  PLT 313  < > 360 342  < > = values in this interval not displayed.  Cardiac Enzymes:  Recent Labs Lab 02/12/16 0159  TROPONINI 0.06*    BNP: Invalid input(s): POCBNP  CBG:  Recent Labs Lab 02/12/2016 1111 02/12/2016 1613 01/25/2016 2140 02/07/2016 0723 01/28/2016 1135  GLUCAP 156* 135* 204* 147*  164*    Microbiology: Recent Results (from the past 720 hour(s))  Blood culture (routine x 2)     Status: None (Preliminary result)   Collection Time: 01/29/2016 12:31 PM  Result Value Ref Range Status   Specimen Description BLOOD RIGHT AC  Final   Special Requests   Final    BOTTLES DRAWN AEROBIC AND ANAEROBIC AER 11ML ANA 8ML   Culture  Setup Time   Final    GRAM POSITIVE COCCI AEROBIC BOTTLE ONLY CRITICAL RESULT CALLED TO, READ BACK BY AND VERIFIED WITH:  KAREN HAYES AT [id number] 01/29/2016 SDR    Culture   Final    GRAM POSITIVE COCCI CULTURE REINCUBATED FOR BETTER GROWTH Performed at [facility], [place] [address] [place], [place] [address]    Report Status PENDING  Incomplete  Blood Culture ID Panel (Reflexed)     Status: None   Collection Time: 02/03/2016 12:31 PM  Result Value Ref Range Status   Enterococcus species NOT DETECTED NOT DETECTED Final   Listeria monocytogenes NOT DETECTED NOT DETECTED Final   Staphylococcus species NOT DETECTED NOT DETECTED Final   Staphylococcus aureus NOT DETECTED NOT DETECTED Final  Streptococcus species NOT DETECTED NOT DETECTED Final   Streptococcus agalactiae NOT DETECTED NOT DETECTED Final   Streptococcus pneumoniae NOT DETECTED NOT DETECTED Final   Streptococcus pyogenes NOT DETECTED NOT DETECTED Final   Acinetobacter baumannii NOT DETECTED NOT DETECTED Final   Enterobacteriaceae species NOT DETECTED NOT DETECTED Final   Enterobacter cloacae complex NOT DETECTED NOT DETECTED Final   Escherichia coli NOT DETECTED NOT DETECTED Final   Klebsiella oxytoca NOT DETECTED NOT DETECTED Final   Klebsiella pneumoniae NOT DETECTED NOT DETECTED Final   Proteus species NOT DETECTED NOT DETECTED Final   Serratia marcescens NOT DETECTED NOT DETECTED Final   Haemophilus influenzae NOT DETECTED NOT DETECTED Final   Neisseria meningitidis NOT DETECTED NOT DETECTED Final   Pseudomonas aeruginosa NOT DETECTED NOT DETECTED Final   Candida albicans NOT  DETECTED NOT DETECTED Final   Candida glabrata NOT DETECTED NOT DETECTED Final   Candida krusei NOT DETECTED NOT DETECTED Final   Candida parapsilosis NOT DETECTED NOT DETECTED Final   Candida tropicalis NOT DETECTED NOT DETECTED Final  Blood culture (routine x 2)     Status: None (Preliminary result)   Collection Time: 02/16/2016 12:32 PM  Result Value Ref Range Status   Specimen Description BLOOD LEFT HAND  Final   Special Requests   Final    BOTTLES DRAWN AEROBIC AND ANAEROBIC AER 13ML ANA 13ML   Culture NO GROWTH 3 DAYS  Final   Report Status PENDING  Incomplete  MRSA PCR Screening     Status: None   Collection Time: 01/28/2016 10:32 PM  Result Value Ref Range Status   MRSA by PCR NEGATIVE NEGATIVE Final    Comment:        The GeneXpert MRSA Assay (FDA approved for NASAL specimens only), is one component of a comprehensive MRSA colonization surveillance program. It is not intended to diagnose MRSA infection nor to guide or monitor treatment for MRSA infections.   Culture, expectorated sputum-assessment     Status: None   Collection Time: 01/25/2016  3:24 PM  Result Value Ref Range Status   Specimen Description SPUTUM  Final   Special Requests Normal  Final   Sputum evaluation   Final    Sputum specimen not acceptable for testing.  Please recollect.   RESULT CALLED TO, READ BACK BY AND VERIFIED WITH: TARA [facility] 02/10/2016 1608 KLW    Report Status 02/15/2016 FINAL  Final     Coagulation Studies:  Recent Labs  02/12/16 0159 01/22/2016 0603 02/10/2016 1250  LABPROT 55.5* 42.0* 32.8*  INR 6.02* 4.25* 3.12    Urinalysis: No results for input(s): COLORURINE, LABSPEC, PHURINE, GLUCOSEU, HGBUR, BILIRUBINUR, KETONESUR, PROTEINUR, UROBILINOGEN, NITRITE, LEUKOCYTESUR in the last 72 hours.  Invalid input(s): APPERANCEUR      Imaging: No results found.   Assessment & Plan: Pt is a 83 y.o. 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 Acute renal failure on chronic kidney disease st 4  versus progression to chronic kidney disease stage V/ESRD Worsening of renal function is likely secondary to abnormal cardiorenal hemodynamics caused by arrhythmias.   Patient is getting antiarrhythmics to control her heart rhythm  Serum creatinine, BUN, sodium have all worsened. Patient is very somnolent today. Appetite remains poor. Potassium has increased to 5.3. Discussed case with patient's daughter and patient. It seems like she is approaching end-stage  renal disease. She will benefit from dialysis. Risks, benefits and alternatives were discussed with patient and her daughter. They have agreed to proceed. Placed a consult with vascular surgery for a dialysis catheter. In light of her high INR and the fact that she had breakfast this morning, she will get a temporary catheter. We will plan on PermCath next week.  We'll plan on first dialysis treatment today. Two hr treatment. We will try to remove about 1000 cc of fluid Next treatment will be tomorrow.  2. Shortness of breath, pulm edema Pneumonia versus arrhythmia versus pulmonary edema Evaluation is in progress     3.  Lower extremity edema Low-salt diet  4. Hyperkalemia and hyponatremia Expected to improve with dialysis

## 2016-02-14 NOTE — Progress Notes (Signed)
Joan Erickson NAME: Joan Erickson    MR#:  [id number]  DATE OF BIRTH:  05/09/1931  SUBJECTIVE:  Creat worsening. REVIEW OF SYSTEMS:   Review of Systems  Constitutional: Negative for chills, fever and weight loss.  HENT: Negative for ear discharge, ear pain and nosebleeds.   Eyes: Negative for blurred vision, pain and discharge.  Respiratory: Positive for shortness of breath. Negative for sputum production, wheezing and stridor.   Cardiovascular: Negative for chest pain, palpitations, orthopnea and PND.  Gastrointestinal: Negative for abdominal pain, diarrhea, nausea and vomiting.  Genitourinary: Negative for frequency and urgency.  Musculoskeletal: Negative for back pain and joint pain.  Neurological: Positive for weakness. Negative for sensory change, speech change and focal weakness.  Psychiatric/Behavioral: Negative for depression and hallucinations. The patient is not nervous/anxious.    Tolerating Diet:yes  DRUG ALLERGIES:  No Known Allergies  VITALS:  Blood pressure (!) 115/56, pulse (!) 58, temperature 98 F (36.7 C), temperature source Oral, resp. rate 20, height 5' (1.524 m), weight 56.2 kg (124 lb), SpO2 95 %.  PHYSICAL EXAMINATION:   Physical Exam  GENERAL:  83 y.o.-year-old patient lying in the bed with no acute distress.  EYES: Pupils equal, round, reactive to light and accommodation. No scleral icterus. Extraocular muscles intact.  HEENT: Head atraumatic, normocephalic. Oropharynx and nasopharynx clear.  NECK:  Supple, no jugular venous distention. No thyroid enlargement, no tenderness.  LUNGS: Normal breath sounds bilaterally, no wheezing, rales, rhonchi. No use of accessory muscles of respiration.  CARDIOVASCULAR: S1, S2 normal. No murmurs, rubs, or gallops.  ABDOMEN: Soft, nontender, nondistended. Bowel sounds present. No organomegaly or mass.  EXTREMITIES: No cyanosis, clubbing or edema b/l.     NEUROLOGIC: Cranial nerves II through XII are intact. No focal Motor or sensory deficits b/l.   PSYCHIATRIC:  patient is alert and oriented x 3.  SKIN: No obvious rash, lesion, or ulcer.  LABORATORY PANEL:  CBC  Recent Labs Lab 02/13/2016 0538  WBC 9.5  HGB 9.5*  HCT 29.1*  PLT 342    Chemistries   Recent Labs Lab 02/04/2016 1117 02/07/2016 2010  02/07/2016 0538  NA 133* 132*  < > 125*  K 5.4* 4.8  < > 5.3*  CL 106 105  < > 97*  CO2 20* 20*  < > 18*  GLUCOSE 128* 126*  < > 160*  BUN 78* 78*  < > 80*  CREATININE 4.95* 5.10*  < > 6.10*  CALCIUM 8.1* 7.9*  < > 7.8*  MG  --  2.6*  --   --   AST 36  --   --   --   ALT 29  --   --   --   ALKPHOS 61  --   --   --   BILITOT 0.7  --   --   --   < > = values in this interval not displayed. Cardiac Enzymes  Recent Labs Lab 02/12/16 0159  TROPONINI 0.06*   RADIOLOGY:  No results found. ASSESSMENT AND PLAN:  Joan Erickson  is a 83 y.o. female with a known history of A. fib, hypertension, hyperlipidemia, CKD stage 4 and asthma. The patient was treated for pneumonia with antibiotics a month ago by her primary care doctor. She has been feeling increasingly weak and shortness of breath on exertion for the past few days. Started to have a palpitation today and feels cold. Her PCP told her that her  heart rate was fast and sent patient to ED. She was found A. fib with RVR at 140   * Acute on chrnicA. fib with RVR. -Continue Cardizem, hold Coumadin due to elevated INR, echocardiogram wnl -cardiology input appreciated -HR controlled  *Acute renal failure on CKD stage 4 and Hyperkalemia - progressing to stage 5/ESRD Hold chlorthalidone and losartan due to renal failure -Nephrology following - plan to place temp cathetar today and start HD -creat 5.10--4.94->5.4  * Pneumonia - Ruled out - normal procalcitonin - stopped Abx - ID c/s as blood c/s growing GPR - likely false +  *Elevated troponin, due to  A. fib and renal failure, neg  serial troponins and cardiology following, hold Coumadin. INR 3.1  *Hyponatremia: Na dropped 125, nephro following. Mgmt per them.   *Anticoagulopathy. Hold Coumadin, follow-up INR.  *Diabetes: sliding scale. Hold Tradjenta  Case discussed with Care Management/Social Worker. Management plans discussed with the patient, family and they are in agreement.  CODE STATUS: full  DVT Prophylaxis: elevated INR, supratherapeutic INR  TOTAL TIME TAKING CARE OF THIS PATIENT: *57minutes.  >50% time spent on counselling and coordination of care  POSSIBLE D/C IN 3-4 DAYS, DEPENDING ON CLINICAL CONDITION.  Note: This dictation was prepared with Dragon dictation along with smaller phrase technology. Any transcriptional errors that result from this process are unintentional.  Max Sane M.D on 02/19/2016 at 6:03 PM  Between 7am to 6pm - Pager - [phone]  After 6pm go to [email or web address] - password [email or web address] [facility]  Office  [phone]  CC: Primary care physician; Einar Pheasant, MD

## 2016-02-14 NOTE — Consult Note (Signed)
[facility] Vascular Consult Note  MRN : [id number]  Joan Erickson is a 83 y.o. (1931/12/17) female who presents with chief complaint of  Chief Complaint  Patient presents with  . Shortness of Breath  . Atrial Flutter  .  History of Present Illness: I am asked to evaluate this patient for dialysis access by Dr. Candiss Norse.  The patient is a 83 y.o. female with a known history of A. fib, hypertension, hyperlipidemia, CKD stage 4 and asthma. The patient was treated for pneumonia with antibiotics a month ago by her primary care doctor. She has been feeling increasingly weak and shortness of breath on exertion for the past few days. Started to have a palpitation today and feels cold. Her PCP told her that her heart rate was fast and sent patient to ED. She was found A. fib with RVR at 140 in the ED and treated with Cardizem IV and by mouth. Heart rate decreased to about 120s. She was found acute renal failure with creatinine increased to 4.95, elevated INR above 5.94. Chest x-ray show persistent pneumonia. Subsequently 2 days ago she underwent cardioversion. She has continued to persistent shortness of breath at her renal function has not improved and is now felt by nephrology that she will require hemodialysis. Given the urgency of the situation and the fact that she has eaten breakfast I will opt for a temporary catheter at this time and subsequently plans for a permacath can be made.  Current Facility-Administered Medications  Medication Dose Route Frequency Provider Last Rate Last Dose  . 0.9 %  sodium chloride infusion  250 mL Intravenous Continuous Corey Skains, MD      . Doug Sou Hold] acetaminophen (TYLENOL) tablet 650 mg  650 mg Oral Q6H PRN Demetrios Loll, MD       Or  . Doug Sou Hold] acetaminophen (TYLENOL) suppository 650 mg  650 mg Rectal Q6H PRN Demetrios Loll, MD      . Doug Sou Hold] albuterol (PROVENTIL) (2.5 MG/3ML) 0.083% nebulizer solution 2.5 mg  2.5 mg Nebulization Q2H PRN Demetrios Loll, MD   2.5 mg at 01/31/2016 0749  . [MAR Hold] bisacodyl (DULCOLAX) EC tablet 5 mg  5 mg Oral Daily PRN Demetrios Loll, MD      . Doug Sou Hold] citalopram (CELEXA) tablet 10 mg  10 mg Oral Daily Demetrios Loll, MD   10 mg at 02/18/2016 2131  . [MAR Hold] diltiazem (CARDIZEM CD) 24 hr capsule 360 mg  360 mg Oral Daily Demetrios Loll, MD   360 mg at 01/31/2016 1059  . [MAR Hold] finasteride (PROSCAR) tablet 5 mg  5 mg Oral Daily Demetrios Loll, MD   5 mg at 02/06/2016 1059  . [MAR Hold] fluticasone (FLONASE) 50 MCG/ACT nasal spray 2 spray  2 spray Each Nare Daily Demetrios Loll, MD   2 spray at 02/07/2016 1100  . [MAR Hold] guaiFENesin-dextromethorphan (ROBITUSSIN DM) 100-10 MG/5ML syrup 5 mL  5 mL Oral Q4H PRN Max Sane, MD   5 mL at 01/30/2016 2219  . [MAR Hold] HYDROcodone-acetaminophen (NORCO/VICODIN) 5-325 MG per tablet 1-2 tablet  1-2 tablet Oral Q4H PRN Demetrios Loll, MD      . Doug Sou Hold] insulin aspart (novoLOG) injection 0-5 Units  0-5 Units Subcutaneous QHS Demetrios Loll, MD   2 Units at 01/24/2016 2219  . [MAR Hold] insulin aspart (novoLOG) injection 0-9 Units  0-9 Units Subcutaneous TID WC Demetrios Loll, MD   2 Units at 02/06/2016 1249  . [facility]  Hold] mometasone-formoterol (DULERA) 200-5 MCG/ACT inhaler 2 puff  2 puff Inhalation BID Demetrios Loll, MD   2 puff at 02/15/2016 [phone]  . [MAR Hold] montelukast (SINGULAIR) tablet 10 mg  10 mg Oral Daily Demetrios Loll, MD   10 mg at 01/26/2016 1059  . [MAR Hold] ondansetron (ZOFRAN) tablet 4 mg  4 mg Oral Q6H PRN Demetrios Loll, MD       Or  . Doug Sou Hold] ondansetron [facility]) injection 4 mg  4 mg Intravenous Q6H PRN Demetrios Loll, MD      . Doug Sou Hold] pravastatin (PRAVACHOL) tablet 20 mg  20 mg Oral q1800 Demetrios Loll, MD   20 mg at 02/10/2016 2131  . [MAR Hold] senna-docusate (Senokot-S) tablet 1 tablet  1 tablet Oral QHS PRN Demetrios Loll, MD      . Doug Sou Hold] sodium chloride flush (NS) 0.9 % injection 3 mL  3 mL Intravenous Q12H Demetrios Loll, MD   3 mL at 02/15/2016 1100  . tuberculin injection 5 Units  5 Units Intradermal Once  Murlean Iba, MD   5 Units at 01/31/2016 1349  . [MAR Hold] warfarin (COUMADIN) tablet 4 mg  4 mg Oral ONCE-1800 Max Sane, MD      . Doug Sou Hold] Warfarin - Pharmacist Dosing Inpatient   Does not apply q1800 Max Sane, MD      . Doug Sou Hold] zolpidem (AMBIEN) tablet 5 mg  5 mg Oral QHS PRN Max Sane, MD        Past Medical History:  Diagnosis Date  . Asthma   . Atrial fibrillation ([place])   . GERD (gastroesophageal reflux disease)   . History of colon polyps   . Hypercholesterolemia   . Hypertension   . Hypogammaglobulinemia ([facility])    IgA  . Warfarin anticoagulation   . Xanthogranulomatous pyelonephritis    s/p right nephrectomy    Past Surgical History:  Procedure Laterality Date  . ABDOMINAL HYSTERECTOMY     ovaries not removed  . CESAREAN SECTION    . ELECTROPHYSIOLOGIC STUDY N/A 02/19/2016   Procedure: CARDIOVERSION;  Surgeon: Corey Skains, MD;  Location: [facility] ORS;  Service: Cardiovascular;  Laterality: N/A;  . NEPHRECTOMY     right - for xanthogranulomatous pyelonephritis    Social History Social History  Substance Use Topics  . Smoking status: Never Smoker  . Smokeless tobacco: Never Used  . Alcohol use No    Family History Family History  Problem Relation Age of Onset  . Brain cancer Mother   . Liver cancer Father     questionabl colon cancer  . Breast cancer Neg Hx   No family history of bleeding/clotting disorders, porphyria or autoimmune disease   No Known Allergies   REVIEW OF SYSTEMS :  Review of Systems  Constitutional: Positive for malaise/fatigue. Negative for chills and fever.  HENT: Negative for congestion, nosebleeds and sore throat.   Eyes: Negative for blurred vision and double vision.  Respiratory: Positive for cough and shortness of breath. Negative for hemoptysis, sputum production, wheezing and stridor.   Cardiovascular: Positive for palpitations and leg swelling. Negative for chest pain and orthopnea.  Gastrointestinal: Negative for  abdominal pain, blood in stool, diarrhea, melena, nausea and vomiting.  Genitourinary: Negative for dysuria, flank pain, hematuria and urgency.  Musculoskeletal: Negative for joint pain.  Skin: Negative for itching and rash.  Neurological: Positive for weakness. Negative for dizziness, focal weakness and loss of consciousness.  Psychiatric/Behavioral: Negative for depression. The patient is not nervous/anxious.  Physical Examination  Vitals:   02/19/2016 0434 01/25/2016 0747 02/11/2016 1105 02/04/2016 1551  BP: 123/73 134/65 (!) 129/50 (!) 107/55  Pulse: (!) 129 68 68 60  Resp: 18 18 18 16   Temp: 97.8 F (36.6 C) 97.7 F (36.5 C) 97.5 F (36.4 C) 98.2 F (36.8 C)  TempSrc: Oral Oral Oral Oral  SpO2: 100% 94% 100% 92%  Weight:      Height:       Body mass index is 24.22 kg/m.  Head: [place]/AT, No temporalis wasting. Prominent temp pulse not noted. Ear/Nose/Throat: Nares w/o erythema or drainage, oropharynx ET tube present   Eyes: PERRLA, Sclera nonicteric.  Neck: Supple, no nuchal rigidity.  No bruit or JVD.  Pulmonary:  Breath sounds equal bilaterally with rales and rhonchi noted bilaterally, no use of accessory muscles.  Cardiac: Irregularly irregular, normal S1, S2, no Murmurs, rubs or gallops. Vascular: Both groins clean dry and intact Gastrointestinal: soft, non-tender, non-distended.  Musculoskeletal: Moves all extremities.  No deformity or atrophy. No edema. Neurologic: Unable to assess secondary to intubation and sedation.  Psychiatric: Patient sedated Dermatologic: No rashes or ulcers noted.  No cellulitis or open wounds. Lymph : No Cervical,  or Inguinal lymphadenopathy.      CBC Lab Results  Component Value Date   WBC 9.5 01/26/2016   HGB 9.5 (L) 02/04/2016   HCT 29.1 (L) 02/06/2016   MCV 84.5 01/24/2016   PLT 342 02/07/2016    BMET    Component Value Date/Time   NA 125 (L) 02/09/2016 0538   K 5.3 (H) 02/01/2016 0538   CL 97 (L) 02/08/2016 0538   CO2 18 (L)  02/10/2016 0538   GLUCOSE 160 (H) 02/19/2016 0538   BUN 80 (H) 01/27/2016 0538   CREATININE 6.10 (H) 02/11/2016 0538   CALCIUM 7.8 (L) 02/05/2016 0538   GFRNONAA 6 (L) 02/18/2016 0538   GFRAA 7 (L) 02/12/2016 0538   Estimated Creatinine Clearance: 5.6 mL/min (by C-G formula based on SCr of 6.1 mg/dL (H)).  COAG Lab Results  Component Value Date   INR 3.12 01/29/2016   INR 4.25 (HH) 02/09/2016   INR 6.02 (HH) 02/12/2016     Assessment/Plan 1. Acute renal failure on chronic kidney disease st 4  versus progression to chronic kidney disease stage V Worsening of renal function is likely secondary to abnormal cardiorenal hemodynamics caused by arrhythmias.   Patient is getting antiarrhythmics to control her heart rhythm. She now has indications for dialysis and this will be initiated today. She is eating breakfast and therefore I would opt for a temporary catheter placed in the femoral to preserve the right internal jugular for a tunneled catheter which can be placed next week.  2.Shortness of breath/pneumonia, Evaluation is ongoing patient is on antibiotics Now that blood pressure has improved, will consider starting Lasix in the morning for volume control   3.  Lower extremity edema Low-salt diet, initiate dialysis  4. Hyperkinemia Improved with Kayexalate   Hortencia Pilar, MD  01/29/2016 4:30 PM

## 2016-02-14 NOTE — Progress Notes (Signed)
Patients right femoral dialysis catheter was instilled with heparin dwell post treatment and immediately insertion site started bleeding,saturating dressing.Site was held with pressure gauze and saturated through 6 gauze dressing with staff holding pressure,this was after 58minutes of holding site post treatment.Dr.Singh was notified via telephone of prolonged site bleeding,orders given to see if femstop or pressure holding device was available to assist in clotting site.Nurse supervisor was called to see if device was available.Nurse supervisor informed staff that femstop was used to stop pressure from arterial bleeding and not recommended for current situation,however a pressure holding device was available.When nurse supervisor inquired on instructions of how to use pressure holding device,found out device could not be used while catheter was still in place,so staff continued to hold site as was doing during this time.Surgiseal was placed on insertion site when pea sized blood stain was noted on gauze dressing.Insertion site stopped bleeding after holding pressure on/off for 1hour 28minutes,patient was kept in dialysis for 75minutes after not holding pressure to ensure bleeding had stopped.Patient tolerated well,no shortness of breath noted.estimated blood loss =30ml.

## 2016-02-14 NOTE — Op Note (Signed)
  OPERATIVE NOTE   PROCEDURE: 1. Insertion of temporary dialysis catheter catheter right femoral approach.  PRE-OPERATIVE DIAGNOSIS: Acute on chronic renal insufficiency  POST-OPERATIVE DIAGNOSIS: Same  SURGEON: Katha Cabal M.D.  ANESTHESIA: 1% lidocaine local infiltration  ESTIMATED BLOOD LOSS: Minimal cc  INDICATIONS:   Joan Erickson is a 83 y.o. female who presents with acute on chronic renal insufficiency and will now require initiation of hemodialysis. We will proceed with temporary catheter placement and later this week we'll plan for tunneled catheter placement.  DESCRIPTION: After obtaining full informed written consent, the patient was positioned supine. The right groin was prepped and draped in a sterile fashion. Ultrasound was placed in a sterile sleeve. Ultrasound was utilized to identify the right common femoral vein which is noted to be echolucent and compressible indicating patency. Images recorded for the permanent record. Under real-time visualization a Seldinger needle is inserted into the vein and the guidewires advanced without difficulty. Small counterincision was made at the wire insertion site. Dilator is passed over the wire and the temporary dialysis catheter catheter is fed over the wire without difficulty.  All lumens aspirate and flush easily and are packed with heparin saline. Catheter secured to the skin of the right thigh with 2-0 silk. A sterile dressing is applied with Biopatch.  COMPLICATIONS: None  CONDITION: Unchanged  Hortencia Pilar Office:  [phone] 01/24/2016, 4:28 PM

## 2016-02-14 NOTE — Progress Notes (Signed)
Hemodialysis completed. 

## 2016-02-14 NOTE — Progress Notes (Signed)
Tuberculin injected to right . This will need to be read Sunday 1/ 28/18

## 2016-02-14 NOTE — Care Management (Signed)
Faxed demographcis, H/P, ID and Nephrology consult and notes to [facility] with [facility] [employer].   Patient had temporary dilaysis cath placed today  and to receive her first dialysis treatment today.  At present, patient and family does not  have a choice of clinics, days or times.  Hep screen has been ordered and PPD order is present.

## 2016-02-14 NOTE — Progress Notes (Signed)
Pt arrived from [facility]. Rt femoral dressing assessed, no bleeding noted on arrival. Pt is alert and oriented. Will continue to monitor

## 2016-02-14 NOTE — Anesthesia Postprocedure Evaluation (Signed)
Anesthesia Post Note  Patient: ELEANER BALDNER  Procedure(s) Performed: Procedure(s) (LRB): CARDIOVERSION (N/A)  Patient location during evaluation: PACU Anesthesia Type: General Level of consciousness: awake and alert Pain management: pain level controlled Vital Signs Assessment: post-procedure vital signs reviewed and stable Respiratory status: spontaneous breathing, nonlabored ventilation, respiratory function stable and patient connected to nasal cannula oxygen Cardiovascular status: blood pressure returned to baseline and stable Postop Assessment: no signs of nausea or vomiting Anesthetic complications: no     Last Vitals:  Vitals:   01/27/2016 0747 02/05/2016 1105  BP: 134/65 (!) 129/50  Pulse: 68 68  Resp: 18 18  Temp: 36.5 C 36.4 C    Last Pain:  Vitals:   02/19/2016 1105  TempSrc: Oral                 Martha Clan

## 2016-02-14 NOTE — Plan of Care (Signed)
Problem: Activity: Goal: Activity intolerance will improve Outcome: Not Progressing Pt is placed on  2L via , patient is 87% on RA at rest and is DOE. I will continue to assess.   Problem: Urinary Elimination: Goal: Progression of disease will be identified and treated Outcome: Progressing Pt will have temp dialysis port placed today. Pt will need HD today.   Problem: Respiratory: Goal: Ability to maintain adequate ventilation will improve Outcome: Progressing 2L placed.

## 2016-02-15 ENCOUNTER — Inpatient Hospital Stay: Payer: Medicare Other

## 2016-02-15 LAB — CBC
HEMATOCRIT: 25.8 % — AB (ref 35.0–47.0)
Hemoglobin: 8.7 g/dL — ABNORMAL LOW (ref 12.0–16.0)
MCH: 28 pg (ref 26.0–34.0)
MCHC: 33.7 g/dL (ref 32.0–36.0)
MCV: 83.1 fL (ref 80.0–100.0)
Platelets: 322 10*3/uL (ref 150–440)
RBC: 3.1 MIL/uL — ABNORMAL LOW (ref 3.80–5.20)
RDW: 16.5 % — AB (ref 11.5–14.5)
WBC: 10.3 10*3/uL (ref 3.6–11.0)

## 2016-02-15 LAB — HEPATITIS B SURFACE ANTIBODY,QUALITATIVE: Hep B S Ab: NONREACTIVE

## 2016-02-15 LAB — GLUCOSE, CAPILLARY
GLUCOSE-CAPILLARY: 178 mg/dL — AB (ref 65–99)
Glucose-Capillary: 122 mg/dL — ABNORMAL HIGH (ref 65–99)
Glucose-Capillary: 130 mg/dL — ABNORMAL HIGH (ref 65–99)
Glucose-Capillary: 117 mg/dL — ABNORMAL HIGH (ref 65–99)

## 2016-02-15 LAB — HEPATITIS C ANTIBODY

## 2016-02-15 LAB — HEPATITIS B CORE ANTIBODY, TOTAL: Hep B Core Total Ab: NEGATIVE

## 2016-02-15 LAB — PROTIME-INR
INR: 3.01
Prothrombin Time: 31.9 seconds — ABNORMAL HIGH (ref 11.4–15.2)

## 2016-02-15 LAB — HEPATITIS B SURFACE ANTIGEN: HEP B S AG: NEGATIVE

## 2016-02-15 MED ORDER — GELATIN ABSORBABLE 12-7 MM EX MISC
1.0000 | Freq: Once | CUTANEOUS | Status: AC
  Filled 2016-02-15: qty 1

## 2016-02-15 MED ORDER — WARFARIN SODIUM 4 MG PO TABS
4.0000 mg | ORAL_TABLET | Freq: Once | ORAL | Status: DC
  Filled 2016-02-15: qty 1

## 2016-02-15 MED ORDER — GELATIN ABSORBABLE 12-7 MM EX MISC
1.0000 | Freq: Once | CUTANEOUS | Status: AC
  Administered 2016-02-15: 1 via TOPICAL
  Filled 2016-02-15: qty 1

## 2016-02-15 NOTE — Progress Notes (Signed)
No bleeding on dressing noted at this time.

## 2016-02-15 NOTE — Progress Notes (Signed)
HD completed without issue. Total UF 1L per order. Patient tolerated well. No further bleeding from catheter site.

## 2016-02-15 NOTE — Progress Notes (Signed)
ANTICOAGULATION CONSULT NOTE - Follow Up Consult  Pharmacy Consult for warfarin  Indication: atrial fibrillation   No Known Allergies  Patient Measurements: Height: 5' (152.4 cm) Weight: 124 lb (56.2 kg) IBW/kg (Calculated) : 45.5  Vital Signs: Temp: 98.4 F (36.9 C) (01/27 0359) Temp Source: Oral (01/27 0359) BP: 132/53 (01/27 0359) Pulse Rate: 83 (01/27 0359)  Labs:  Recent Labs  02/03/2016 0603  02/07/2016 1532 01/30/2016 0538 01/31/2016 1250 02/15/16 1023  HGB  --   < > 9.7* 9.5*  --  8.7*  HCT  --   --  28.7* 29.1*  --  25.8*  PLT  --   --  360 342  --  322  LABPROT 42.0*  --   --   --  32.8* 31.9*  INR 4.25*  --   --   --  3.12 3.01  CREATININE  --   --  5.45* 6.10*  --   --   < > = values in this interval not displayed.  Estimated Creatinine Clearance: 5.6 mL/min (by C-G formula based on SCr of 6.1 mg/dL (H)).   Medical History: Past Medical History:  Diagnosis Date  . Asthma   . Atrial fibrillation ([place])   . GERD (gastroesophageal reflux disease)   . History of colon polyps   . Hypercholesterolemia   . Hypertension   . Hypogammaglobulinemia ([facility])    IgA  . Warfarin anticoagulation   . Xanthogranulomatous pyelonephritis    s/p right nephrectomy    Medications:  Prescriptions Prior to Admission  Medication Sig Dispense Refill Last Dose  . acetaminophen (TYLENOL) 500 MG tablet Take 500 mg by mouth every 6 (six) hours as needed.   prn at prn  . ADVAIR DISKUS 250-50 MCG/DOSE AEPB INHALE 1 PUFF BY MOUTH TWICE A DAY 60 each 5 02/10/2016 at 2100  . chlorthalidone (HYGROTON) 25 MG tablet Take 0.5 tablets (12.5 mg total) by mouth daily. 30 tablet 2 02/05/2016 at 0900  . Cholecalciferol (VITAMIN D) 2000 UNITS tablet Take 2,000 Units by mouth daily.   02/06/2016 at 0900  . citalopram (CELEXA) 10 MG tablet Take 1 tablet (10 mg total) by mouth daily. 90 tablet 1 01/23/2016 at 0900  . diltiazem (TIAZAC) 360 MG 24 hr capsule Take 1 capsule (360 mg total) by mouth daily. 90  capsule 1 02/05/2016 at 0900  . finasteride (PROSCAR) 5 MG tablet    01/23/2016 at 0900  . fluticasone (FLONASE) 50 MCG/ACT nasal spray USE 2 SPRAYS IN EACH NOSTRIL ONCE A DAY 16 g 5 02/10/2016 at Unknown time  . losartan (COZAAR) 100 MG tablet TAKE 1 TABLET BY MOUTH ONCE A DAY (Patient taking differently: tk 0.5 t qd) 30 tablet 5 02/09/2016 at 0900  . montelukast (SINGULAIR) 10 MG tablet Take 1 tablet (10 mg total) by mouth daily. 90 tablet 1 01/24/2016 at 0900  . pravastatin (PRAVACHOL) 20 MG tablet TAKE 1 TABLET (20 MG TOTAL) BY MOUTH DAILY. 30 tablet 8 02/10/2016 at 2100  . PROAIR HFA 108 (90 Base) MCG/ACT inhaler INHALE 2 PUFF EVERY SIX HOURS AS NEEDED 8.5 Inhaler 2 02/10/2016 at 0900  . TRADJENTA 5 MG TABS tablet TAKE 1 TABLET (5 MG TOTAL) BY MOUTH DAILY. 30 tablet 11 01/21/2016 at 0900  . warfarin (COUMADIN) 1 MG tablet Take 1 mg by mouth at bedtime. Pt takes 0.5 t in addition to the 5 mg dose  4 02/10/2016 at 2100  . warfarin (COUMADIN) 5 MG tablet Take 5 mg by mouth at  bedtime. Pt takes 5 mg dose along with 0.5 of a 1 mg tab qhs   02/10/2016 at 2100  . Blood Glucose Monitoring Suppl (ONE TOUCH ULTRA SYSTEM KIT) W/DEVICE KIT 1 kit by Does not apply route once. 1 each 0 Taking  . eplerenone (INSPRA) 25 MG tablet TAKE 1 TABLET (25 MG TOTAL) BY MOUTH ONCE DAILY FOR 14 DAYS.  0 Taking  . fluticasone (FLONASE) 50 MCG/ACT nasal spray USE 2 SPRAYS IN EACH NOSTRIL ONCE A DAY 16 g 1 Taking  . ONE TOUCH ULTRA TEST test strip CHECK BLOOD SUGAR 2 TIMES A DAY DX: E11.9 100 each 3 Taking   Scheduled:  . citalopram  10 mg Oral Daily  . diltiazem  360 mg Oral Daily  . finasteride  5 mg Oral Daily  . fluticasone  2 spray Each Nare Daily  . insulin aspart  0-5 Units Subcutaneous QHS  . insulin aspart  0-9 Units Subcutaneous TID WC  . mometasone-formoterol  2 puff Inhalation BID  . montelukast  10 mg Oral Daily  . pravastatin  20 mg Oral q1800  . sodium chloride flush  3 mL Intravenous Q12H  . tuberculin  5  Units Intradermal Once  . Warfarin - Pharmacist Dosing Inpatient   Does not apply q1800    Assessment: Pharmacy consulted to dose and monitor warfarin therapy in this 83 year old woman with atrial fibrillation. Patient was admitted with elevated INR.  Patient takes warfarin 6 mg PO daily per Med rec  1/26 INR 3.12   Coumadin 61m 1/27 INR 3.01  Goal of Therapy:  INR 2-3 Monitor platelets by anticoagulation protocol: Yes   Plan:  Will give a reduced dose of warfarin 4 mg PO x 1. Will recheck PT/INR with am labs.   KCanovaClinical Pharmacist 02/15/2016,11:11 AM

## 2016-02-15 NOTE — Progress Notes (Signed)
Pt dressing became saturated. MD Jannifer Franklin made aware. Pressure dressing reapplied and pt repositioned to supine. Dressing being checked every 15 minutes starting at 23:30 with no bleeding noted at this time.

## 2016-02-15 NOTE — Progress Notes (Signed)
Pre HD  

## 2016-02-15 NOTE — Progress Notes (Signed)
[facility] NAME: Joan Erickson    MR#:  [id number]  DATE OF BIRTH:  [birth year]  SUBJECTIVE:  Patent patient was bleeding from the dialysis catheter site pressure dressing applied Denies any chest pain  REVIEW OF SYSTEMS:   Review of Systems  Constitutional: Negative for chills, fever and weight loss.  HENT: Negative for ear discharge, ear pain and nosebleeds.   Eyes: Negative for blurred vision, pain and discharge.  Respiratory: Positive for shortness of breath. Negative for sputum production, wheezing and stridor.   Cardiovascular: Negative for chest pain, palpitations, orthopnea and PND.  Gastrointestinal: Negative for abdominal pain, diarrhea, nausea and vomiting.  Genitourinary: Negative for frequency and urgency.  Musculoskeletal: Negative for back pain and joint pain.  Neurological: Positive for weakness. Negative for sensory change, speech change and focal weakness.  Psychiatric/Behavioral: Negative for depression and hallucinations. The patient is not nervous/anxious.    Tolerating Diet:yes  DRUG ALLERGIES:  No Known Allergies  VITALS:  Blood pressure (!) 153/59, pulse 80, temperature 97.6 F (36.4 C), temperature source Oral, resp. rate 19, height 5' (1.524 m), weight 64 kg (141 lb 1.5 oz), SpO2 100 %.  PHYSICAL EXAMINATION:   Physical Exam  GENERAL:  83 y.o.-year-old patient lying in the bed with no acute distress.  EYES: Pupils equal, round, reactive to light and accommodation. No scleral icterus. Extraocular muscles intact.  HEENT: Head atraumatic, normocephalic. Oropharynx and nasopharynx clear.  NECK:  Supple, no jugular venous distention. No thyroid enlargement, no tenderness.  LUNGS: Normal breath sounds bilaterally, no wheezing, rales, rhonchi. No use of accessory muscles of respiration.  CARDIOVASCULAR: S1, S2 normal. No murmurs, rubs, or gallops.  ABDOMEN: Soft, nontender, nondistended. Bowel  sounds present. No organomegaly or mass.  EXTREMITIES: No bleeding noticed on the right groin area at the dialysis catheter site  No cyanosis, clubbing or edema b/l.    NEUROLOGIC: Cranial nerves II through XII are intact. No focal Motor or sensory deficits b/l.   PSYCHIATRIC:  patient is alert and oriented x 3.  SKIN: No obvious rash, lesion, or ulcer.  LABORATORY PANEL:  CBC  Recent Labs Lab 02/15/16 1023  WBC 10.3  HGB 8.7*  HCT 25.8*  PLT 322    Chemistries   Recent Labs Lab 02/06/2016 1117 01/23/2016 2010  02/08/2016 0538  NA 133* 132*  < > 125*  K 5.4* 4.8  < > 5.3*  CL 106 105  < > 97*  CO2 20* 20*  < > 18*  GLUCOSE 128* 126*  < > 160*  BUN 78* 78*  < > 80*  CREATININE 4.95* 5.10*  < > 6.10*  CALCIUM 8.1* 7.9*  < > 7.8*  MG  --  2.6*  --   --   AST 36  --   --   --   ALT 29  --   --   --   ALKPHOS 61  --   --   --   BILITOT 0.7  --   --   --   < > = values in this interval not displayed. Cardiac Enzymes  Recent Labs Lab 02/12/16 0159  TROPONINI 0.06*   RADIOLOGY:  No results found. ASSESSMENT AND PLAN:  Joan Erickson  is a 83 y.o. female with a known history of A. fib, hypertension, hyperlipidemia, CKD stage 4 and asthma. The patient was treated for pneumonia with antibiotics a month ago by her primary care doctor. She has  been feeling increasingly weak and shortness of breath on exertion for the past few days. Started to have a palpitation today and feels cold. Her PCP told her that her heart rate was fast and sent patient to ED. She was found A. fib with RVR at 140   * Acute on chrnicA. fib with RVR. -Rate controlled -Continue Cardizem, hold Coumadin due to elevated INR 3.01 echocardiogram wnl -cardiology input appreciated - - *Acute renal failure on CKD stage 4 and hyponatremia and Hyperkalemia - progressing to stage 5/ESRD Hold chlorthalidone and losartan due to renal failure -Nephrology following - plan to place temp cathetar 01/26/2016 had hemodialysis  today -creat 5.10--4.94->5.4 -Pressure dressing applied to dialysis catheter site secondary to bleeding -Follow up with nephrology -Monitor hemoglobin 9.5-8.7  * Pneumonia - Ruled out - normal procalcitonin - stopped Abx - ID c/s as blood c/s growing GPR - likely false +  *Elevated troponin, due to  A. fib and renal failure, neg serial troponins and cardiology following, hold Coumadin. INR 3.1  *Hyponatremia: Na dropped 125, nephro following.  Patient had hemodialysis today   *Anticoagulopathy. Hold Coumadin, follow-up INR.  *Diabetes: sliding scale. Hold Tradjenta  Case discussed with Care Management/Social Worker. Management plans discussed with the patient,  daughter and they are in agreement.  Discussed with Dr. Candiss Norse  CODE STATUS: full  DVT Prophylaxis: elevated INR, supratherapeutic INR  TOTAL TIME TAKING CARE OF THIS PATIENT: 36 minutes.  >50% time spent on counselling and coordination of care  POSSIBLE D/C IN 3 DAYS, DEPENDING ON CLINICAL CONDITION.  Note: This dictation was prepared with Dragon dictation along with smaller phrase technology. Any transcriptional errors that result from this process are unintentional.  Joan Erickson M.D on 02/15/2016 at 3:19 PM  Between 7am to 6pm - Pager - [phone]  After 6pm go to [email or web address] - password [email or web address]  Office  [phone]  CC: Primary care physician; Einar Pheasant, MD

## 2016-02-15 NOTE — Progress Notes (Signed)
HD initiated without issue via R Fem Cath. Large amount of blood on pad and patient gown. Another pressure dressing applied and surgifoam ordered from pharmacy, will apply a new pressure dressing with foam when arrives.

## 2016-02-15 NOTE — Progress Notes (Signed)
Family member, present at bedside, refuses Dr. Rinaldo Ratel verbal order for HD catheter dressing change prior to HD today. Vascular surgeon present at bedside also at this time, who agrees to assess site when patient in HD in a few hours. Already spoke to HD nurse who verbalizes will change when patient receives HD later today. Will continue to monitor. Wenda Low [facility]

## 2016-02-15 NOTE — Progress Notes (Signed)
ANTICOAGULATION CONSULT NOTE - Follow Up Consult  Pharmacy Consult for warfarin  Indication: atrial fibrillation   No Known Allergies  Patient Measurements: Height: 5' (152.4 cm) Weight: 141 lb 1.5 oz (64 kg) IBW/kg (Calculated) : 45.5  Vital Signs: Temp: 97.6 F (36.4 C) (01/27 1449) Temp Source: Oral (01/27 1449) BP: 153/59 (01/27 1456) Pulse Rate: 80 (01/27 1456)  Labs:  Recent Labs  01/30/2016 0603  02/10/2016 1532 02/02/2016 0538 02/13/2016 1250 02/15/16 1023  HGB  --   < > 9.7* 9.5*  --  8.7*  HCT  --   --  28.7* 29.1*  --  25.8*  PLT  --   --  360 342  --  322  LABPROT 42.0*  --   --   --  32.8* 31.9*  INR 4.25*  --   --   --  3.12 3.01  CREATININE  --   --  5.45* 6.10*  --   --   < > = values in this interval not displayed.  Estimated Creatinine Clearance: 5.9 mL/min (by C-G formula based on SCr of 6.1 mg/dL (H)).   Medical History: Past Medical History:  Diagnosis Date  . Asthma   . Atrial fibrillation (HCC)   . GERD (gastroesophageal reflux disease)   . History of colon polyps   . Hypercholesterolemia   . Hypertension   . Hypogammaglobulinemia (HCC)    IgA  . Warfarin anticoagulation   . Xanthogranulomatous pyelonephritis    s/p right nephrectomy    Medications:  Prescriptions Prior to Admission  Medication Sig Dispense Refill Last Dose  . acetaminophen (TYLENOL) 500 MG tablet Take 500 mg by mouth every 6 (six) hours as needed.   prn at prn  . ADVAIR DISKUS 250-50 MCG/DOSE AEPB INHALE 1 PUFF BY MOUTH TWICE A DAY 60 each 5 02/10/2016 at 2100  . chlorthalidone (HYGROTON) 25 MG tablet Take 0.5 tablets (12.5 mg total) by mouth daily. 30 tablet 2 02/10/2016 at 0900  . Cholecalciferol (VITAMIN D) 2000 UNITS tablet Take 2,000 Units by mouth daily.   01/21/2016 at 0900  . citalopram (CELEXA) 10 MG tablet Take 1 tablet (10 mg total) by mouth daily. 90 tablet 1 02/19/2016 at 0900  . diltiazem (TIAZAC) 360 MG 24 hr capsule Take 1 capsule (360 mg total) by mouth  daily. 90 capsule 1 01/25/2016 at 0900  . finasteride (PROSCAR) 5 MG tablet    02/18/2016 at 0900  . fluticasone (FLONASE) 50 MCG/ACT nasal spray USE 2 SPRAYS IN EACH NOSTRIL ONCE A DAY 16 g 5 02/10/2016 at Unknown time  . losartan (COZAAR) 100 MG tablet TAKE 1 TABLET BY MOUTH ONCE A DAY (Patient taking differently: tk 0.5 t qd) 30 tablet 5 01/25/2016 at 0900  . montelukast (SINGULAIR) 10 MG tablet Take 1 tablet (10 mg total) by mouth daily. 90 tablet 1 02/10/2016 at 0900  . pravastatin (PRAVACHOL) 20 MG tablet TAKE 1 TABLET (20 MG TOTAL) BY MOUTH DAILY. 30 tablet 8 02/10/2016 at 2100  . PROAIR HFA 108 (90 Base) MCG/ACT inhaler INHALE 2 PUFF EVERY SIX HOURS AS NEEDED 8.5 Inhaler 2 02/10/2016 at 0900  . TRADJENTA 5 MG TABS tablet TAKE 1 TABLET (5 MG TOTAL) BY MOUTH DAILY. 30 tablet 11 02/08/2016 at 0900  . warfarin (COUMADIN) 1 MG tablet Take 1 mg by mouth at bedtime. Pt takes 0.5 t in addition to the 5 mg dose  4 02/10/2016 at 2100  . warfarin (COUMADIN) 5 MG tablet Take 5 mg by   mouth at bedtime. Pt takes 5 mg dose along with 0.5 of a 1 mg tab qhs   02/10/2016 at 2100  . Blood Glucose Monitoring Suppl (ONE TOUCH ULTRA SYSTEM KIT) W/DEVICE KIT 1 kit by Does not apply route once. 1 each 0 Taking  . eplerenone (INSPRA) 25 MG tablet TAKE 1 TABLET (25 MG TOTAL) BY MOUTH ONCE DAILY FOR 14 DAYS.  0 Taking  . fluticasone (FLONASE) 50 MCG/ACT nasal spray USE 2 SPRAYS IN EACH NOSTRIL ONCE A DAY 16 g 1 Taking  . ONE TOUCH ULTRA TEST test strip CHECK BLOOD SUGAR 2 TIMES A DAY DX: E11.9 100 each 3 Taking   Scheduled:  . citalopram  10 mg Oral Daily  . diltiazem  360 mg Oral Daily  . finasteride  5 mg Oral Daily  . fluticasone  2 spray Each Nare Daily  . insulin aspart  0-5 Units Subcutaneous QHS  . insulin aspart  0-9 Units Subcutaneous TID WC  . mometasone-formoterol  2 puff Inhalation BID  . montelukast  10 mg Oral Daily  . pravastatin  20 mg Oral q1800  . sodium chloride flush  3 mL Intravenous Q12H  .  tuberculin  5 Units Intradermal Once    Assessment: Pharmacy consulted to dose and monitor warfarin therapy in this 83 year old woman with atrial fibrillation. Patient was admitted with elevated INR.  Patient takes warfarin 6 mg PO daily per Med rec  1/26 INR 3.12   Coumadin 50m 1/27 INR 3.01  Goal of Therapy:  INR 2-3 Monitor platelets by anticoagulation protocol: Yes   Plan:  Previous plan was to give a reduced dose of warfarin 4 mg PO x 1. However patient is now bleeding from dialysis catheterization site. Per MD's request, will hold warfarin tonight.  Will recheck PT/INR with am labs.   SPernell Dupre PharmD, BCPS Clinical Pharmacist 02/15/2016 3:19 PM

## 2016-02-15 NOTE — Progress Notes (Signed)
Subjective:  Patient continues to feel poorly although breathing is somewhat better than yesterday. Still appears to be short of breath. Appetite remains poor No nausea or vomiting reported  1000 cc of fluid removed with HD yesterday Post dialysis, there was bleeding noted around the cathter. Stopped with holding pressure Was oozing still this morning    Weight trends: [employer]   01/25/2016 2038 02/19/2016 0728  Weight: 56.6 kg (124 lb 12.8 oz) 56.2 kg (124 lb)    Physical Exam: General:  ill-appearing,   HEENT Anicteric, moist oral mucous membranes  Neck:  supple, no masses  Lungs: bilateral crackles, mild wheezing today  Heart:: irregular rate and rhythm  Abdomen: Soft, nontender, nondistended  Extremities:  2+ pitting edema bilaterally, L > Rt  Neurologic: Lethargic, however, able to answer a few questions   Skin: Warm, dry, normal turgor             Lab results: Basic Metabolic Panel:  Recent Labs Lab 01/31/2016 2010 02/12/16 0159 02/05/2016 1532 02/13/2016 0538  NA 132* 134* 126* 125*  K 4.8 4.3 4.8 5.3*  CL 105 106 100* 97*  CO2 20* 22 19* 18*  GLUCOSE 126* 172* 146* 160*  BUN 78* 77* 79* 80*  CREATININE 5.10* 4.94* 5.45* 6.10*  CALCIUM 7.9* 7.8* 7.6* 7.8*  MG 2.6*  --   --   --     Liver Function Tests:  Recent Labs Lab 02/16/2016 1117  AST 36  ALT 29  ALKPHOS 61  BILITOT 0.7  PROT 7.2  ALBUMIN 2.7*   No results for input(s): LIPASE, AMYLASE in the last 168 hours. No results for input(s): AMMONIA in the last 168 hours.  CBC:  Recent Labs Lab 01/26/2016 1117  02/09/2016 0538 02/15/16 1023  WBC 6.5  < > 9.5 10.3  NEUTROABS 5.3  --   --   --   HGB 9.7*  < > 9.5* 8.7*  HCT 28.2*  < > 29.1* 25.8*  MCV 82.5  < > 84.5 83.1  PLT 313  < > 342 322  < > = values in this interval not displayed.  Cardiac Enzymes:  Recent Labs Lab 02/12/16 0159  TROPONINI 0.06*    BNP: Invalid input(s): POCBNP  CBG:  Recent Labs Lab 02/19/2016 0723  02/07/2016 1135 02/15/2016 1716 01/24/2016 2237 02/15/16 0823  GLUCAP 147* 164* 125* 114* 122*    Microbiology: Recent Results (from the past 720 hour(s))  Blood culture (routine x 2)     Status: None (Preliminary result)   Collection Time: 02/15/2016 12:31 PM  Result Value Ref Range Status   Specimen Description BLOOD RIGHT AC  Final   Special Requests   Final    BOTTLES DRAWN AEROBIC AND ANAEROBIC AER 11ML ANA 8ML   Culture  Setup Time   Final    GRAM POSITIVE COCCI AEROBIC BOTTLE ONLY CRITICAL RESULT CALLED TO, READ BACK BY AND VERIFIED WITH:  KAREN HAYES AT [id number] 02/13/16 SDR    Culture   Final    GRAM POSITIVE COCCI CULTURE REINCUBATED FOR BETTER GROWTH Performed at [facility], [place] [address], [place], [place] [address]    Report Status PENDING  Incomplete  Blood Culture ID Panel (Reflexed)     Status: None   Collection Time: 02/05/2016 12:31 PM  Result Value Ref Range Status   Enterococcus species NOT DETECTED NOT DETECTED Final   Listeria monocytogenes NOT DETECTED NOT DETECTED Final   Staphylococcus species NOT DETECTED NOT DETECTED Final  Staphylococcus aureus NOT DETECTED NOT DETECTED Final   Streptococcus species NOT DETECTED NOT DETECTED Final   Streptococcus agalactiae NOT DETECTED NOT DETECTED Final   Streptococcus pneumoniae NOT DETECTED NOT DETECTED Final   Streptococcus pyogenes NOT DETECTED NOT DETECTED Final   Acinetobacter baumannii NOT DETECTED NOT DETECTED Final   Enterobacteriaceae species NOT DETECTED NOT DETECTED Final   Enterobacter cloacae complex NOT DETECTED NOT DETECTED Final   Escherichia coli NOT DETECTED NOT DETECTED Final   Klebsiella oxytoca NOT DETECTED NOT DETECTED Final   Klebsiella pneumoniae NOT DETECTED NOT DETECTED Final   Proteus species NOT DETECTED NOT DETECTED Final   Serratia marcescens NOT DETECTED NOT DETECTED Final   Haemophilus influenzae NOT DETECTED NOT DETECTED Final   Neisseria meningitidis NOT DETECTED NOT DETECTED  Final   Pseudomonas aeruginosa NOT DETECTED NOT DETECTED Final   Candida albicans NOT DETECTED NOT DETECTED Final   Candida glabrata NOT DETECTED NOT DETECTED Final   Candida krusei NOT DETECTED NOT DETECTED Final   Candida parapsilosis NOT DETECTED NOT DETECTED Final   Candida tropicalis NOT DETECTED NOT DETECTED Final  Blood culture (routine x 2)     Status: None (Preliminary result)   Collection Time: 02/10/2016 12:32 PM  Result Value Ref Range Status   Specimen Description BLOOD LEFT HAND  Final   Special Requests   Final    BOTTLES DRAWN AEROBIC AND ANAEROBIC AER 13ML ANA 13ML   Culture NO GROWTH 3 DAYS  Final   Report Status PENDING  Incomplete  MRSA PCR Screening     Status: None   Collection Time: 02/17/2016 10:32 PM  Result Value Ref Range Status   MRSA by PCR NEGATIVE NEGATIVE Final    Comment:        The GeneXpert MRSA Assay (FDA approved for NASAL specimens only), is one component of a comprehensive MRSA colonization surveillance program. It is not intended to diagnose MRSA infection nor to guide or monitor treatment for MRSA infections.   Culture, expectorated sputum-assessment     Status: None   Collection Time: 02/05/2016  3:24 PM  Result Value Ref Range Status   Specimen Description SPUTUM  Final   Special Requests Normal  Final   Sputum evaluation   Final    Sputum specimen not acceptable for testing.  Please recollect.   RESULT CALLED TO, READ BACK BY AND VERIFIED WITH: TARA [facility] 01/27/2016 1608 KLW    Report Status 01/27/2016 FINAL  Final     Coagulation Studies:  Recent Labs  02/04/2016 0603 02/03/2016 1250 02/15/16 1023  LABPROT 42.0* 32.8* 31.9*  INR 4.25* 3.12 3.01    Urinalysis: No results for input(s): COLORURINE, LABSPEC, PHURINE, GLUCOSEU, HGBUR, BILIRUBINUR, KETONESUR, PROTEINUR, UROBILINOGEN, NITRITE, LEUKOCYTESUR in the last 72 hours.  Invalid input(s): APPERANCEUR      Imaging: No results found.   Assessment & Plan: Pt is a 83  y.o. 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 ESRD/ [facility] Started with HD  #2 treatment today UF as tlerated Next treatment tomorrow or Monday Will need permcath prior to discharge Family prefers dialysis set up at [place]  2. Shortness of breath, pulm edema Pneumonia versus arrhythmia versus pulmonary edema Evaluation is in progress   3.  Lower extremity edema Low-salt diet UF with HD  4. Hyperkalemia and hyponatremia Expected to improve  with dialysis   5. Bleeding around cathter site Pressure dressing Consider holding coumadin

## 2016-02-15 NOTE — Progress Notes (Signed)
Post HD assessment, unchanged.  

## 2016-02-15 NOTE — Progress Notes (Addendum)
Vascular MD at bedside.

## 2016-02-16 ENCOUNTER — Encounter: Payer: Self-pay | Admitting: Internal Medicine

## 2016-02-16 ENCOUNTER — Inpatient Hospital Stay: Payer: Medicare Other

## 2016-02-16 LAB — BASIC METABOLIC PANEL
ANION GAP: 10 (ref 5–15)
BUN: 35 mg/dL — ABNORMAL HIGH (ref 6–20)
CO2: 25 mmol/L (ref 22–32)
CREATININE: 4.39 mg/dL — AB (ref 0.44–1.00)
Calcium: 7.5 mg/dL — ABNORMAL LOW (ref 8.9–10.3)
Chloride: 96 mmol/L — ABNORMAL LOW (ref 101–111)
GFR calc non Af Amer: 9 mL/min — ABNORMAL LOW (ref >60)
GFR, EST AFRICAN AMERICAN: 10 mL/min — AB (ref >60)
GLUCOSE: 121 mg/dL — AB (ref 65–99)
Potassium: 4.1 mmol/L (ref 3.5–5.1)
Sodium: 131 mmol/L — ABNORMAL LOW (ref 135–145)

## 2016-02-16 LAB — CULTURE, BLOOD (ROUTINE X 2): Culture: NO GROWTH

## 2016-02-16 LAB — GLUCOSE, CAPILLARY
GLUCOSE-CAPILLARY: 182 mg/dL — AB (ref 65–99)
Glucose-Capillary: 124 mg/dL — ABNORMAL HIGH (ref 65–99)
Glucose-Capillary: 108 mg/dL — ABNORMAL HIGH (ref 65–99)
Glucose-Capillary: 130 mg/dL — ABNORMAL HIGH (ref 65–99)

## 2016-02-16 LAB — CBC
HCT: 23.8 % — ABNORMAL LOW (ref 35.0–47.0)
Hemoglobin: 8.2 g/dL — ABNORMAL LOW (ref 12.0–16.0)
MCH: 28.1 pg (ref 26.0–34.0)
MCHC: 34.4 g/dL (ref 32.0–36.0)
MCV: 81.5 fL (ref 80.0–100.0)
PLATELETS: 303 10*3/uL (ref 150–440)
RBC: 2.92 MIL/uL — AB (ref 3.80–5.20)
RDW: 16.3 % — ABNORMAL HIGH (ref 11.5–14.5)
WBC: 9 10*3/uL (ref 3.6–11.0)

## 2016-02-16 LAB — PROTIME-INR
INR: 2.53
Prothrombin Time: 27.7 seconds — ABNORMAL HIGH (ref 11.4–15.2)

## 2016-02-16 MED ORDER — WARFARIN SODIUM 4 MG PO TABS
4.0000 mg | ORAL_TABLET | Freq: Once | ORAL | Status: AC
  Administered 2016-02-16: 4 mg via ORAL
  Filled 2016-02-16: qty 1

## 2016-02-16 MED ORDER — GUAIFENESIN-DM 100-10 MG/5ML PO SYRP
10.0000 mL | ORAL_SOLUTION | Freq: Four times a day (QID) | ORAL | Status: DC | PRN
  Administered 2016-02-24: 10 mL via ORAL
  Filled 2016-02-16: qty 10

## 2016-02-16 MED ORDER — WARFARIN - PHARMACIST DOSING INPATIENT: Freq: Every day | Status: DC

## 2016-02-16 MED ORDER — SODIUM CHLORIDE 0.9 % IV BOLUS (SEPSIS)
500.0000 mL | Freq: Once | INTRAVENOUS | Status: AC
  Administered 2016-02-16: 500 mL via INTRAVENOUS

## 2016-02-16 MED ORDER — DIPHENHYDRAMINE HCL 25 MG PO CAPS
25.0000 mg | ORAL_CAPSULE | Freq: Every evening | ORAL | Status: DC | PRN
  Administered 2016-02-19: 25 mg via ORAL
  Filled 2016-02-16: qty 1

## 2016-02-16 MED ORDER — DILTIAZEM HCL 25 MG/5ML IV SOLN
10.0000 mg | Freq: Once | INTRAVENOUS | Status: AC
Start: 2016-02-16 — End: 2016-02-16
  Administered 2016-02-16: 10 mg via INTRAVENOUS
  Filled 2016-02-16: qty 5

## 2016-02-16 MED ORDER — ENSURE ENLIVE PO LIQD
237.0000 mL | Freq: Two times a day (BID) | ORAL | Status: DC
  Administered 2016-02-17 – 2016-02-24 (×11): 237 mL via ORAL

## 2016-02-16 NOTE — Assessment & Plan Note (Signed)
Low carb diet and exercise.  Follow met b and a1c.  States sugars have been under good control.  Follow.

## 2016-02-16 NOTE — Progress Notes (Signed)
Notified Dr. Marcille Blanco of heart rate in the 120's to 140's. 1/2 liter bolus given with no results. Notified Dr. Again of no change in heart rate.

## 2016-02-16 NOTE — Progress Notes (Signed)
HD initiated without issue via R Fem Cath. No bleeding today. Dressing changed. Pt currently has no complaints. AFib on monitor, rate 110s-150s. Dr. Candiss Norse aware. Continue to monitor closely

## 2016-02-16 NOTE — Progress Notes (Signed)
[place]  SUBJECTIVE: asymptomatic but in recurrent afib   Vitals:   02/16/16 1519 02/16/16 1530 02/16/16 1559 02/16/16 1630  BP: (!) 162/59 (!) 155/53 (!) 143/60 125/68  Pulse: (!) 109 91 (!) 105 (!) 122  Resp: 15 16 15 15   Temp:      TempSrc:      SpO2: 100% 99% 99% 100%  Weight:      Height:        Intake/Output Summary (Last 24 hours) at 02/16/16 1653 Last data filed at 02/16/16 1300  Gross per 24 hour  Intake              740 ml  Output              650 ml  Net               90 ml    LABS: Basic Metabolic Panel:  Recent Labs  02/11/2016 0538 02/16/16 0516  NA 125* 131*  K 5.3* 4.1  CL 97* 96*  CO2 18* 25  GLUCOSE 160* 121*  BUN 80* 35*  CREATININE 6.10* 4.39*  CALCIUM 7.8* 7.5*   Liver Function Tests: No results for input(s): AST, ALT, ALKPHOS, BILITOT, PROT, ALBUMIN in the last 72 hours. No results for input(s): LIPASE, AMYLASE in the last 72 hours. CBC:  Recent Labs  02/15/16 1023 02/16/16 0516  WBC 10.3 9.0  HGB 8.7* 8.2*  HCT 25.8* 23.8*  MCV 83.1 81.5  PLT 322 303   Cardiac Enzymes: No results for input(s): CKTOTAL, CKMB, CKMBINDEX, TROPONINI in the last 72 hours. BNP: Invalid input(s): POCBNP D-Dimer: No results for input(s): DDIMER in the last 72 hours. Hemoglobin A1C: No results for input(s): HGBA1C in the last 72 hours. Fasting Lipid Panel: No results for input(s): CHOL, HDL, LDLCALC, TRIG, CHOLHDL, LDLDIRECT in the last 72 hours. Thyroid Function Tests: No results for input(s): TSH, T4TOTAL, T3FREE, THYROIDAB in the last 72 hours.  Invalid input(s): FREET3 Anemia Panel: No results for input(s): VITAMINB12, FOLATE, FERRITIN, TIBC, IRON, RETICCTPCT in the last 72 hours.   Physical Exam: Blood pressure 125/68, pulse (!) 122, temperature 98 F (36.7 C), temperature source Oral, resp. rate 15, height 5' (1.524 m), weight 142 lb 10.2 oz (64.7 kg), SpO2 100 %.   Wt Readings from Last 1  Encounters:  02/16/16 142 lb 10.2 oz (64.7 kg)     General appearance: alert and cooperative Resp: clear to auscultation bilaterally Cardio: irregularly irregular rhythm GI: soft, non-tender; bowel sounds normal; no masses,  no organomegaly Neurologic: Grossly normal  TELEMETRY: Reviewed telemetry pt in afib  ASSESSMENT AND PLAN:  Active Problems:   ARF (acute renal failure) (HCC)-on hd per dept of nephrology  afib-had been cardioverted to nsr. Today had recurrent afib with good control. Will continue with rate control with cardizem and anticoagulate with warfarin with inr goal between 2-3. Pt is asymptomatic with her afib. Rate control appears to be best choice at present. Will cotiniue with diltizem for rate control and anticoagulate. Would only reconsider repeat cardioversion if she becomes symptomatic.     Teodoro Spray, MD, [facility] 02/16/2016 4:53 PM

## 2016-02-16 NOTE — Progress Notes (Signed)
Pre HD  

## 2016-02-16 NOTE — Assessment & Plan Note (Signed)
Was recently evaluated and treated for bronchitis.  With persistent cough and congestion.  Wheezing noted on exam.  Sob with exertion.  atrovent neb given.  Breathing improved. She felt better and was more comfortable after neb.  Heart rate still elevated.  To ER/hospital for further treatment.  Will need f/u cxr and/or scan.

## 2016-02-16 NOTE — Progress Notes (Signed)
Patients HR fluctuating from 100-130's. MD made aware. Gave dose of PO cardizem. Will continue to monitor.

## 2016-02-16 NOTE — Progress Notes (Signed)
ANTICOAGULATION CONSULT NOTE - Follow Up Consult  Pharmacy Consult for warfarin  Indication: atrial fibrillation   No Known Allergies  Patient Measurements: Height: 5' (152.4 cm) Weight: 141 lb 1.5 oz (64 kg) IBW/kg (Calculated) : 45.5  Vital Signs: Temp: 97.9 F (36.6 C) (01/28 1122) Temp Source: Oral (01/28 1122) BP: 137/87 (01/28 1122) Pulse Rate: 119 (01/28 1122)  Labs:  Recent Labs  02/03/2016 1532 01/26/2016 0538 02/01/2016 1250 02/15/16 1023 02/16/16 0516  HGB 9.7* 9.5*  --  8.7* 8.2*  HCT 28.7* 29.1*  --  25.8* 23.8*  PLT 360 342  --  322 303  LABPROT  --   --  32.8* 31.9* 27.7*  INR  --   --  3.12 3.01 2.53  CREATININE 5.45* 6.10*  --   --  4.39*    Estimated Creatinine Clearance: 8.3 mL/min (by C-G formula based on SCr of 4.39 mg/dL (H)).   Medical History: Past Medical History:  Diagnosis Date  . Asthma   . Atrial fibrillation ([place])   . GERD (gastroesophageal reflux disease)   . History of colon polyps   . Hypercholesterolemia   . Hypertension   . Hypogammaglobulinemia ([facility])    IgA  . Warfarin anticoagulation   . Xanthogranulomatous pyelonephritis    s/p right nephrectomy    Medications:  Prescriptions Prior to Admission  Medication Sig Dispense Refill Last Dose  . acetaminophen (TYLENOL) 500 MG tablet Take 500 mg by mouth every 6 (six) hours as needed.   prn at prn  . ADVAIR DISKUS 250-50 MCG/DOSE AEPB INHALE 1 PUFF BY MOUTH TWICE A DAY 60 each 5 02/10/2016 at 2100  . chlorthalidone (HYGROTON) 25 MG tablet Take 0.5 tablets (12.5 mg total) by mouth daily. 30 tablet 2 02/06/2016 at 0900  . Cholecalciferol (VITAMIN D) 2000 UNITS tablet Take 2,000 Units by mouth daily.   01/31/2016 at 0900  . citalopram (CELEXA) 10 MG tablet Take 1 tablet (10 mg total) by mouth daily. 90 tablet 1 02/10/2016 at 0900  . diltiazem (TIAZAC) 360 MG 24 hr capsule Take 1 capsule (360 mg total) by mouth daily. 90 capsule 1 01/31/2016 at 0900  . finasteride (PROSCAR) 5 MG tablet     02/17/2016 at 0900  . fluticasone (FLONASE) 50 MCG/ACT nasal spray USE 2 SPRAYS IN EACH NOSTRIL ONCE A DAY 16 g 5 02/10/2016 at Unknown time  . losartan (COZAAR) 100 MG tablet TAKE 1 TABLET BY MOUTH ONCE A DAY (Patient taking differently: tk 0.5 t qd) 30 tablet 5 01/30/2016 at 0900  . montelukast (SINGULAIR) 10 MG tablet Take 1 tablet (10 mg total) by mouth daily. 90 tablet 1 02/12/2016 at 0900  . pravastatin (PRAVACHOL) 20 MG tablet TAKE 1 TABLET (20 MG TOTAL) BY MOUTH DAILY. 30 tablet 8 02/10/2016 at 2100  . PROAIR HFA 108 (90 Base) MCG/ACT inhaler INHALE 2 PUFF EVERY SIX HOURS AS NEEDED 8.5 Inhaler 2 02/10/2016 at 0900  . TRADJENTA 5 MG TABS tablet TAKE 1 TABLET (5 MG TOTAL) BY MOUTH DAILY. 30 tablet 11 01/25/2016 at 0900  . warfarin (COUMADIN) 1 MG tablet Take 1 mg by mouth at bedtime. Pt takes 0.5 t in addition to the 5 mg dose  4 02/10/2016 at 2100  . warfarin (COUMADIN) 5 MG tablet Take 5 mg by mouth at bedtime. Pt takes 5 mg dose along with 0.5 of a 1 mg tab qhs   02/10/2016 at 2100  . Blood Glucose Monitoring Suppl (ONE TOUCH ULTRA SYSTEM KIT) W/DEVICE KIT  1 kit by Does not apply route once. 1 each 0 Taking  . eplerenone (INSPRA) 25 MG tablet TAKE 1 TABLET (25 MG TOTAL) BY MOUTH ONCE DAILY FOR 14 DAYS.  0 Taking  . fluticasone (FLONASE) 50 MCG/ACT nasal spray USE 2 SPRAYS IN EACH NOSTRIL ONCE A DAY 16 g 1 Taking  . ONE TOUCH ULTRA TEST test strip CHECK BLOOD SUGAR 2 TIMES A DAY DX: E11.9 100 each 3 Taking   Scheduled:  . citalopram  10 mg Oral Daily  . diltiazem  360 mg Oral Daily  . finasteride  5 mg Oral Daily  . fluticasone  2 spray Each Nare Daily  . insulin aspart  0-5 Units Subcutaneous QHS  . insulin aspart  0-9 Units Subcutaneous TID WC  . mometasone-formoterol  2 puff Inhalation BID  . montelukast  10 mg Oral Daily  . pravastatin  20 mg Oral q1800  . sodium chloride flush  3 mL Intravenous Q12H  . tuberculin  5 Units Intradermal Once  . warfarin  4 mg Oral ONCE-1800  . Warfarin -  Pharmacist Dosing Inpatient   Does not apply q1800    Assessment: Pharmacy consulted to dose and monitor warfarin therapy in this 83 year old woman with atrial fibrillation. Patient was admitted with elevated INR.  Patient takes warfarin 6 mg PO daily per Med rec  1/26 INR 3.12   Coumadin 58m 1/27 INR 3.01   Held 1/28 INR 2.53  Goal of Therapy:  INR 2-3 Monitor platelets by anticoagulation protocol: Yes   Plan:  1/27  Previous plan was to give a reduced dose of warfarin 4 mg PO x 1. However patient is now bleeding from dialysis catheterization site. Per MD's request, will hold warfarin tonight.   1/28 Ordered Warfarin 432mfor tonight.  Will recheck PT/INR with am labs.   KaOlivia CanterRPNorth CarolinaClinical Pharmacist 02/16/2016 12:01 PM

## 2016-02-16 NOTE — Progress Notes (Signed)
Subjective:  Patient states that she feels a little better compared to yesterday Still feeling some shortness of breath Heart rate was high overnight, she is back in atrial fibrillation She was given 500 cc of IV fluid bolus which did not improve her heart rate Lower extremity edema has improved significantly  Left arm is more swollen compared to right   Weight trends: Filed Weights   02/04/2016 0728 02/15/16 1210 02/15/16 1449  Weight: 56.2 kg (124 lb) 65 kg (143 lb 4.8 oz) 64 kg (141 lb 1.5 oz)    Physical Exam: General:  ill-appearing,   HEENT Anicteric, moist oral mucous membranes  Neck:  supple, no masses  Lungs: bilateral crackles, mild wheezing today  Heart:: irregular rate and rhythm  Abdomen: Soft, nontender, nondistended  Extremities: trace pitting edema bilaterally, L > Rt  Neurologic: Lethargic, however, able to answer a few questions   Skin: Warm, dry, normal turgor             Lab results: Basic Metabolic Panel:  Recent Labs Lab 01/26/2016 2010  02/10/2016 1532 02/08/2016 0538 02/16/16 0516  NA 132*  < > 126* 125* 131*  K 4.8  < > 4.8 5.3* 4.1  CL 105  < > 100* 97* 96*  CO2 20*  < > 19* 18* 25  GLUCOSE 126*  < > 146* 160* 121*  BUN 78*  < > 79* 80* 35*  CREATININE 5.10*  < > 5.45* 6.10* 4.39*  CALCIUM 7.9*  < > 7.6* 7.8* 7.5*  MG 2.6*  --   --   --   --   < > = values in this interval not displayed.  Liver Function Tests:  Recent Labs Lab 01/20/2016 1117  AST 36  ALT 29  ALKPHOS 61  BILITOT 0.7  PROT 7.2  ALBUMIN 2.7*   No results for input(s): LIPASE, AMYLASE in the last 168 hours. No results for input(s): AMMONIA in the last 168 hours.  CBC:  Recent Labs Lab 02/18/2016 1117  02/15/16 1023 02/16/16 0516  WBC 6.5  < > 10.3 9.0  NEUTROABS 5.3  --   --   --   HGB 9.7*  < > 8.7* 8.2*  HCT 28.2*  < > 25.8* 23.8*  MCV 82.5  < > 83.1 81.5  PLT 313  < > 322 303  < > = values in this interval not displayed.  Cardiac Enzymes:  Recent  Labs Lab 02/12/16 0159  TROPONINI 0.06*    BNP: Invalid input(s): POCBNP  CBG:  Recent Labs Lab 02/15/16 1130 02/15/16 1712 02/15/16 2104 02/16/16 0721 02/16/16 1120  GLUCAP 178* 130* 117* 124* 182*    Microbiology: Recent Results (from the past 720 hour(s))  Blood culture (routine x 2)     Status: Abnormal (Preliminary result)   Collection Time: 02/17/2016 12:31 PM  Result Value Ref Range Status   Specimen Description BLOOD RIGHT AC  Final   Special Requests   Final    BOTTLES DRAWN AEROBIC AND ANAEROBIC AER 11ML ANA 8ML   Culture  Setup Time   Final    GRAM POSITIVE COCCI AEROBIC BOTTLE ONLY CRITICAL RESULT CALLED TO, READ BACK BY AND VERIFIED WITH:  Charlane Ferretti AT [id number] 02/02/2016 SDR Performed at [facility], [address], [place], [place] [address]    Culture [facility] HOMINIS (A)  Final   Report Status PENDING  Incomplete  Blood Culture ID Panel (Reflexed)     Status: None   Collection  Time: 01/26/2016 12:31 PM  Result Value Ref Range Status   Enterococcus species NOT DETECTED NOT DETECTED Final   Listeria monocytogenes NOT DETECTED NOT DETECTED Final   Staphylococcus species NOT DETECTED NOT DETECTED Final   Staphylococcus aureus NOT DETECTED NOT DETECTED Final   Streptococcus species NOT DETECTED NOT DETECTED Final   Streptococcus agalactiae NOT DETECTED NOT DETECTED Final   Streptococcus pneumoniae NOT DETECTED NOT DETECTED Final   Streptococcus pyogenes NOT DETECTED NOT DETECTED Final   Acinetobacter baumannii NOT DETECTED NOT DETECTED Final   Enterobacteriaceae species NOT DETECTED NOT DETECTED Final   Enterobacter cloacae complex NOT DETECTED NOT DETECTED Final   Escherichia coli NOT DETECTED NOT DETECTED Final   Klebsiella oxytoca NOT DETECTED NOT DETECTED Final   Klebsiella pneumoniae NOT DETECTED NOT DETECTED Final   Proteus species NOT DETECTED NOT DETECTED Final   Serratia marcescens NOT DETECTED NOT DETECTED Final   Haemophilus influenzae  NOT DETECTED NOT DETECTED Final   Neisseria meningitidis NOT DETECTED NOT DETECTED Final   Pseudomonas aeruginosa NOT DETECTED NOT DETECTED Final   Candida albicans NOT DETECTED NOT DETECTED Final   Candida glabrata NOT DETECTED NOT DETECTED Final   Candida krusei NOT DETECTED NOT DETECTED Final   Candida parapsilosis NOT DETECTED NOT DETECTED Final   Candida tropicalis NOT DETECTED NOT DETECTED Final  Blood culture (routine x 2)     Status: None (Preliminary result)   Collection Time: 02/05/2016 12:32 PM  Result Value Ref Range Status   Specimen Description BLOOD LEFT HAND  Final   Special Requests   Final    BOTTLES DRAWN AEROBIC AND ANAEROBIC AER 13ML ANA 13ML   Culture NO GROWTH 4 DAYS  Final   Report Status PENDING  Incomplete  MRSA PCR Screening     Status: None   Collection Time: 02/12/2016 10:32 PM  Result Value Ref Range Status   MRSA by PCR NEGATIVE NEGATIVE Final    Comment:        The GeneXpert MRSA Assay (FDA approved for NASAL specimens only), is one component of a comprehensive MRSA colonization surveillance program. It is not intended to diagnose MRSA infection nor to guide or monitor treatment for MRSA infections.   Culture, expectorated sputum-assessment     Status: None   Collection Time: 02/12/2016  3:24 PM  Result Value Ref Range Status   Specimen Description SPUTUM  Final   Special Requests Normal  Final   Sputum evaluation   Final    Sputum specimen not acceptable for testing.  Please recollect.   RESULT CALLED TO, READ BACK BY AND VERIFIED WITH: TARA [facility] 02/03/2016 1608 KLW    Report Status 02/07/2016 FINAL  Final     Coagulation Studies:  Recent Labs  02/15/2016 1250 02/15/16 1023 02/16/16 0516  LABPROT 32.8* 31.9* 27.7*  INR 3.12 3.01 2.53    Urinalysis: No results for input(s): COLORURINE, LABSPEC, PHURINE, GLUCOSEU, HGBUR, BILIRUBINUR, KETONESUR, PROTEINUR, UROBILINOGEN, NITRITE, LEUKOCYTESUR in the last 72 hours.  Invalid input(s):  APPERANCEUR      Imaging: Dg Chest Port 1 View  Result Date: 02/15/2016 CLINICAL DATA:  Asthma.  Shortness of breath. EXAM: PORTABLE CHEST 1 VIEW COMPARISON:  01/31/2016 FINDINGS: The cardiac silhouette is enlarged with preferential large amount of the right heart border. Mediastinal contours appear intact. No evidence of pneumothorax. Small right pleural effusion. Tiny left pleural effusion cannot be excluded. Mild increase of the interstitial markings. Osseous structures are without acute abnormality. Soft tissues are grossly normal. IMPRESSION: Enlargement  of the cardiac silhouette with abnormality of the right heart border. This may represent predominantly right heart enlargement, however overlapping hilar soft tissue mass cannot be excluded. Further evaluation with chest CT may be considered if found clinically appropriate. Smaller right and tiny left pleural effusions. Mild pulmonary edema. Electronically Signed   By: Fidela Salisbury M.D.   On: 02/15/2016 16:05     Assessment & Plan: Pt is a 83 y.o. 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 ESRD/ [facility] Started with HD  #3 treatment today; slow BFR and DFR due to tenuous cardiac rhythm UF as tolerated 1-1.5 L today   Will need permcath prior to discharge Family prefers dialysis set up at [facility]  2. Shortness of breath, pulm edema Probably combination of Pneumonia, cardiac arrhythmias and pulmonary edema Evaluation is in progress   3.  Lower extremity edema Low-salt diet UF with HD  4. Hyperkalemia and hyponatremia Expected to improve with dialysis   5. Bleeding around cathter site Pressure dressing; extra stitch placed by vascular surgery over bleeding site Consider holding coumadin for permcath  placement this coming week  6. Left arm Edema - u/s duplex to evaluate for DVT

## 2016-02-16 NOTE — Progress Notes (Signed)
Pre HD assessment  

## 2016-02-16 NOTE — Progress Notes (Signed)
HD completed without issue. Total UF 1.5L as ordered. Patient tolerated well. HR remains 110-140bpm Afib. Patient denies symptoms or complaints.

## 2016-02-16 NOTE — Assessment & Plan Note (Signed)
Followed by nephrology. 

## 2016-02-16 NOTE — Assessment & Plan Note (Signed)
On citalopram.  Stable.   

## 2016-02-16 NOTE — Progress Notes (Signed)
Post HD assessment unchanged  

## 2016-02-16 NOTE — Assessment & Plan Note (Signed)
Blood pressure elevated today.  Feel related to current ongoing issues.  Has been under good control.  Follow pressures.  Follow metabolic panel.

## 2016-02-16 NOTE — Assessment & Plan Note (Signed)
On pravastatin.  Low cholesterol diet and exercise.  Follow lipid panel and liver function tests.   

## 2016-02-16 NOTE — Assessment & Plan Note (Signed)
On coumadin.  It appears she is overdue pt/inr check.  Increased heart rate noted on exam.  Did not improve with neb and improvement in her breathing.  EKG - question of aflutter with vent rate of 140s.  Discussed treatment with her.  Will transfer her to ER for further evaluation and w/up.  Transferred via EMS.

## 2016-02-16 NOTE — Plan of Care (Signed)
Problem: Physical Regulation: Goal: Ability to maintain clinical measurements within normal limits will improve Outcome: Not Progressing Patient's HR ranging from 100-150's. Patient asymptomatic.

## 2016-02-16 NOTE — Assessment & Plan Note (Signed)
Has seen Dr Eddie Dibbles.  Biopsy negative.  Follow.

## 2016-02-16 NOTE — Progress Notes (Signed)
[place] [facility] [place] NAME: Joan Erickson    MR#:  [id number]  DATE OF BIRTH:  11-Dec-1931  SUBJECTIVE:  Patent patient was bleeding from the dialysis catheter site pressure dressing applied Denies any chest pain  REVIEW OF SYSTEMS:   Review of Systems  Constitutional: Negative for chills, fever and weight loss.  HENT: Negative for ear discharge, ear pain and nosebleeds.   Eyes: Negative for blurred vision, pain and discharge.  Respiratory: Positive for shortness of breath. Negative for sputum production, wheezing and stridor.   Cardiovascular: Negative for chest pain, palpitations, orthopnea and PND.  Gastrointestinal: Negative for abdominal pain, diarrhea, nausea and vomiting.  Genitourinary: Negative for frequency and urgency.  Musculoskeletal: Negative for back pain and joint pain.  Neurological: Positive for weakness. Negative for sensory change, speech change and focal weakness.  Psychiatric/Behavioral: Negative for depression and hallucinations. The patient is not nervous/anxious.    Tolerating Diet:yes  DRUG ALLERGIES:  No Known Allergies  VITALS:  Blood pressure 135/80, pulse (!) 124, temperature 98 F (36.7 C), temperature source Oral, resp. rate 15, height 5' (1.524 m), weight 64.7 kg (142 lb 10.2 oz), SpO2 99 %.  PHYSICAL EXAMINATION:   Physical Exam  GENERAL:  83 y.o.-year-old patient lying in the bed with no acute distress.  EYES: Pupils equal, round, reactive to light and accommodation. No scleral icterus. Extraocular muscles intact.  HEENT: Head atraumatic, normocephalic. Oropharynx and nasopharynx clear.  NECK:  Supple, no jugular venous distention. No thyroid enlargement, no tenderness.  LUNGS: Normal breath sounds bilaterally, no wheezing, rales, rhonchi. No use of accessory muscles of respiration.  CARDIOVASCULAR: S1, S2 normal. No murmurs, rubs, or gallops.  ABDOMEN: Soft, nontender, nondistended. Bowel  sounds present. No organomegaly or mass.  EXTREMITIES: No bleeding noticed on the right groin area at the dialysis catheter site  No cyanosis, clubbing or edema b/l.    NEUROLOGIC: Cranial nerves II through XII are intact. No focal Motor or sensory deficits b/l.   PSYCHIATRIC:  patient is alert and oriented x 3.  SKIN: No obvious rash, lesion, or ulcer.  LABORATORY PANEL:  CBC  Recent Labs Lab 02/16/16 0516  WBC 9.0  HGB 8.2*  HCT 23.8*  PLT 303    Chemistries   Recent Labs Lab 01/24/2016 1117 01/23/2016 2010  02/16/16 0516  NA 133* 132*  < > 131*  K 5.4* 4.8  < > 4.1  CL 106 105  < > 96*  CO2 20* 20*  < > 25  GLUCOSE 128* 126*  < > 121*  BUN 78* 78*  < > 35*  CREATININE 4.95* 5.10*  < > 4.39*  CALCIUM 8.1* 7.9*  < > 7.5*  MG  --  2.6*  --   --   AST 36  --   --   --   ALT 29  --   --   --   ALKPHOS 61  --   --   --   BILITOT 0.7  --   --   --   < > = values in this interval not displayed. Cardiac Enzymes  Recent Labs Lab 02/12/16 0159  TROPONINI 0.06*   RADIOLOGY:  Dg Chest Port 1 View  Result Date: 02/15/2016 CLINICAL DATA:  Asthma.  Shortness of breath. EXAM: PORTABLE CHEST 1 VIEW COMPARISON:  02/03/2016 FINDINGS: The cardiac silhouette is enlarged with preferential large amount of the right heart border. Mediastinal contours appear intact. No evidence of  pneumothorax. Small right pleural effusion. Tiny left pleural effusion cannot be excluded. Mild increase of the interstitial markings. Osseous structures are without acute abnormality. Soft tissues are grossly normal. IMPRESSION: Enlargement of the cardiac silhouette with abnormality of the right heart border. This may represent predominantly right heart enlargement, however overlapping hilar soft tissue mass cannot be excluded. Further evaluation with chest CT may be considered if found clinically appropriate. Smaller right and tiny left pleural effusions. Mild pulmonary edema. Electronically Signed   By: Fidela Salisbury M.D.   On: 02/15/2016 16:05   ASSESSMENT AND PLAN:  Joan Erickson  is a 83 y.o. female with a known history of A. fib, hypertension, hyperlipidemia, CKD stage 4 and asthma. The patient was treated for pneumonia with antibiotics a month ago by her primary care doctor. She has been feeling increasingly weak and shortness of breath on exertion for the past few days. Started to have a palpitation today and feels cold. Her PCP told her that her heart rate was fast and sent patient to ED. She was found A. fib with RVR at 140   * Acute on chrnicA. fib with RVR. -Heart rate is fluctuating from 110-125, discussed with cardiology, no new recommendations  at this time -Continue Cardizem, hold Coumadin for permacath placement next week  echocardiogram wnl -cardiology input appreciated - - *Acute renal failure on CKD stage 4 and hyponatremia and Hyperkalemia - progressing to stage 5/ESRD Hold chlorthalidone and losartan due to renal failure -Nephrology following - plan to place temp cathetar 02/19/2016 had hemodialysis today -creat 5.10--4.94->5.4 -Follow up with nephrology -Monitor hemoglobin 9.5-8.7  * Pneumonia - Ruled out - normal procalcitonin - stopped Abx - ID c/s as blood c/s growing GPR - likely false +  *Elevated troponin, due to  A. fib and renal failure, neg serial troponins and cardiology following, hold Coumadin. INR 2.53  *Hyponatremia: Na improved from 125-131  nephro following.  Patient had hemodialysis 1/26, 1/27, today scheduled for dialysis again  *Anticoagulopathy.  Continue to hold Coumadin for permacath placement early next week  *Diabetes: sliding scale. Hold Tradjenta  *Insomnia Benadryl as needed  Case discussed with Care Management/Social Worker. Management plans discussed with the patient,  daughter and they are in agreement.  Discussed with Dr. Candiss Norse  CODE STATUS: full  DVT Prophylaxis: elevated INR, supratherapeutic INR  TOTAL TIME TAKING  CARE OF THIS PATIENT: 36 minutes.  >50% time spent on counselling and coordination of care  POSSIBLE D/C IN 3 DAYS, DEPENDING ON CLINICAL CONDITION.  Note: This dictation was prepared with Dragon dictation along with smaller phrase technology. Any transcriptional errors that result from this process are unintentional.  Nicholes Mango M.D on 02/16/2016 at 2:58 PM  Between 7am to 6pm - Pager - [phone]  After 6pm go to [email or web address] - password [email or web address]  Office  [phone]  CC: Primary care physician; Einar Pheasant, MD

## 2016-02-17 ENCOUNTER — Other Ambulatory Visit: Payer: Self-pay

## 2016-02-17 ENCOUNTER — Inpatient Hospital Stay: Payer: Medicare Other

## 2016-02-17 LAB — GLUCOSE, CAPILLARY
GLUCOSE-CAPILLARY: 118 mg/dL — AB (ref 65–99)
GLUCOSE-CAPILLARY: 170 mg/dL — AB (ref 65–99)
GLUCOSE-CAPILLARY: 176 mg/dL — AB (ref 65–99)
Glucose-Capillary: 130 mg/dL — ABNORMAL HIGH (ref 65–99)
Glucose-Capillary: 121 mg/dL — ABNORMAL HIGH (ref 65–99)

## 2016-02-17 LAB — CULTURE, BLOOD (ROUTINE X 2)

## 2016-02-17 LAB — PROTIME-INR
INR: 2.02
Prothrombin Time: 23.2 seconds — ABNORMAL HIGH (ref 11.4–15.2)

## 2016-02-17 MED ORDER — METOPROLOL TARTRATE 50 MG PO TABS
50.0000 mg | ORAL_TABLET | Freq: Four times a day (QID) | ORAL | Status: DC
  Administered 2016-02-17 – 2016-02-21 (×10): 50 mg via ORAL
  Filled 2016-02-17 (×12): qty 1

## 2016-02-17 MED ORDER — MAGNESIUM SULFATE 2 GM/50ML IV SOLN: 2.0000 g | Freq: Once | INTRAVENOUS | Status: DC

## 2016-02-17 MED ORDER — DILTIAZEM HCL 25 MG/5ML IV SOLN
INTRAVENOUS | Status: AC
  Administered 2016-02-17: 10 mg via INTRAVENOUS
  Filled 2016-02-17: qty 5

## 2016-02-17 MED ORDER — DILTIAZEM HCL 25 MG/5ML IV SOLN
INTRAVENOUS | Status: DC
  Administered 2016-02-17: 17:00:00 via INTRAVENOUS
  Filled 2016-02-17 (×3): qty 100

## 2016-02-17 MED ORDER — METOPROLOL TARTRATE 5 MG/5ML IV SOLN
5.0000 mg | Freq: Once | INTRAVENOUS | Status: AC
  Administered 2016-02-17: 5 mg via INTRAVENOUS

## 2016-02-17 MED ORDER — DILTIAZEM HCL 25 MG/5ML IV SOLN
INTRAVENOUS | Status: DC
  Administered 2016-02-17 – 2016-02-21 (×4): via INTRAVENOUS
  Filled 2016-02-17 (×11): qty 100

## 2016-02-17 MED ORDER — ADENOSINE 6 MG/2ML IV SOLN
6.0000 mg | INTRAVENOUS | Status: AC
  Administered 2016-02-17: 6 mg via INTRAVENOUS
  Filled 2016-02-17: qty 2

## 2016-02-17 MED ORDER — DILTIAZEM HCL 25 MG/5ML IV SOLN
INTRAVENOUS | Status: DC
  Filled 2016-02-17 (×4): qty 100

## 2016-02-17 MED ORDER — DILTIAZEM HCL-DEXTROSE 125-5 MG/125ML-% IV SOLN (PREMIX): 5.0000 mg/h | INTRAVENOUS | Status: DC

## 2016-02-17 MED ORDER — METOPROLOL TARTRATE 5 MG/5ML IV SOLN
5.0000 mg | INTRAVENOUS | Status: DC | PRN
  Administered 2016-02-17 – 2016-02-20 (×4): 5 mg via INTRAVENOUS
  Filled 2016-02-17: qty 10
  Filled 2016-02-17 (×6): qty 5

## 2016-02-17 MED ORDER — DILTIAZEM HCL 25 MG/5ML IV SOLN
10.0000 mg | Freq: Once | INTRAVENOUS | Status: AC
  Administered 2016-02-17: 10 mg via INTRAVENOUS

## 2016-02-17 MED ORDER — AMIODARONE HCL IN DEXTROSE 360-4.14 MG/200ML-% IV SOLN
30.0000 mg/h | INTRAVENOUS | Status: DC
  Administered 2016-02-17 – 2016-02-21 (×3): 30 mg/h via INTRAVENOUS
  Filled 2016-02-17 (×12): qty 200

## 2016-02-17 MED ORDER — DIGOXIN 0.25 MG/ML IJ SOLN
0.2500 mg | Freq: Once | INTRAMUSCULAR | Status: AC
  Administered 2016-02-17: 0.25 mg via INTRAVENOUS
  Filled 2016-02-17: qty 2

## 2016-02-17 MED ORDER — DILTIAZEM HCL 100 MG IV SOLR: 5.0000 mg/h | INTRAVENOUS | Status: DC

## 2016-02-17 MED ORDER — METOPROLOL TARTRATE 5 MG/5ML IV SOLN
5.0000 mg | INTRAVENOUS | Status: AC | PRN
  Administered 2016-02-17 (×3): 5 mg via INTRAVENOUS

## 2016-02-17 MED ORDER — DILTIAZEM HCL 100 MG IV SOLR
5.0000 mg/h | INTRAVENOUS | Status: DC
  Filled 2016-02-17: qty 100

## 2016-02-17 MED ORDER — ADENOSINE 12 MG/4ML IV SOLN
12.0000 mg | Freq: Once | INTRAVENOUS | Status: AC
  Administered 2016-02-17: 12 mg via INTRAVENOUS
  Filled 2016-02-17: qty 4

## 2016-02-17 MED ORDER — SODIUM CHLORIDE 0.9 % IV SOLN
1.0000 g | Freq: Once | INTRAVENOUS | Status: AC
  Administered 2016-02-17: 1 g via INTRAVENOUS
  Filled 2016-02-17: qty 10

## 2016-02-17 MED ORDER — DIGOXIN 0.25 MG/ML IJ SOLN
INTRAMUSCULAR | Status: AC
  Filled 2016-02-17: qty 2

## 2016-02-17 NOTE — Progress Notes (Signed)
[facility]  HD 1.27.2018    02/15/16 1217 02/15/16 1237 02/15/16 1259  During Hemodialysis Assessment  Blood Flow Rate (mL/min) 250 mL/min 250 mL/min 250 mL/min  Arterial Pressure (mmHg) -100 mmHg -100 mmHg -100 mmHg  Venous Pressure (mmHg) 90 mmHg 100 mmHg 100 mmHg  Transmembrane Pressure (mmHg) 50 mmHg 50 mmHg 60 mmHg  Ultrafiltration Rate (mL/min) 600 mL/min 600 mL/min 600 mL/min  Dialysate Flow Rate (mL/min) 500 ml/min 500 ml/min 500 ml/min  Conductivity: Machine  14.1 14.1 14.2  HD Safety Checks Performed Yes Yes Yes  Dialysis Fluid Bolus Normal Saline --  --   Bolus Amount (mL) 250 mL --  --   Dialysate Change (3k 2.5ca) --  --   Intra-Hemodialysis Comments 250cc prime, initiated via R Fem HD cath. Has had significant amount of seeping from insertion site overnight. Pad and gown saturated with blood. 4x4 compression dressing applied and surgifoam ordered from pharmacy.  196 mL removed. Vascular MD at bedside 425 mL removed. no change. Vascular placed suture to cath insertion site. no further bleeding     02/15/16 1318 02/15/16 1330 02/15/16 1357  During Hemodialysis Assessment  Blood Flow Rate (mL/min) 250 mL/min 250 mL/min 250 mL/min  Arterial Pressure (mmHg) -100 mmHg -90 mmHg -100 mmHg  Venous Pressure (mmHg) 100 mmHg 100 mmHg 90 mmHg  Transmembrane Pressure (mmHg) 60 mmHg 50 mmHg 60 mmHg  Ultrafiltration Rate (mL/min) 600 mL/min 600 mL/min 600 mL/min  Dialysate Flow Rate (mL/min) 500 ml/min 500 ml/min 50 ml/min  Conductivity: Machine  14.1 14.2 14.2  HD Safety Checks Performed Yes Yes Yes  Dialysis Fluid Bolus --  --  --   Bolus Amount (mL) --  --  --   Dialysate Change --  --  --   Intra-Hemodialysis Comments 606 mL removed. pt sleeping 768 mL removed. pt sleeping, vs stable 990 mL removed, pt sleeping

## 2016-02-17 NOTE — Progress Notes (Signed)
Notified by CCMD that patient's heart rate has sustained in the 170's. Dr. Ubaldo Glassing on unit and MD stated to push 10mg  of cardizem. Given now. Will continue to monitor.

## 2016-02-17 NOTE — Progress Notes (Signed)
Notified cardiology, Dr. Clayborn Bigness, of what has been going on with patient, MD is covering for Dr. Ubaldo Glassing at this time. MD stated to give 5mg  pushes of metoprolol 5-10 minutes apart now X3 if needed while waiting on amio drip to be delivered to unit. Will give first push now and continue to assess.

## 2016-02-17 NOTE — Significant Event (Signed)
Rapid Response Event Note  Overview: Time Called: 1610 Arrival Time: 1612 Event Type: Cardiac  Initial Focused Assessment: When RRT RN arrived in room, patient was alert and oriented and appeared to be resting comfortably in bed hooked up to dinamap machine. Patient stated that she "felt good", had no shortness of breath or dizziness. Patient on 2 L nasal cannula and O2 saturation in upper 90s. Patient's RN, Surveyor, minerals, other RNs from 2A, and patient's family member at bedside. Patient in SVT 170-173 on cardiac monitor. Patient's RN had already given 10 mg of IV push cardizem with no effect. Dr. Margaretmary Eddy had already given order over the phone to give adenosine 6 mg IV push.   Interventions: Patient connected to zoll monitor and BP obtained of 142/73, MAP 86. Gave 6 mg of adenosine IV push. Patient's heart rate lowered after a few seconds to 80s (afib) and then after a few seconds increased back to 170s. Patient with no complaints. BP 163/69, MAP 91. MD arrived and then 12 mg of adenosine ordered and given IV push (pre push BP 197/84, MAP 113). Patient's heart rate lowered again to 80s (afib) and then increased and stabilized in 130-140s. Patient again had no complaints.  Plan of Care (if not transferred): Patient to receive another 10 mg of cardizem IV push and begin cardizem infusion. Patient currently to remain on 2A. Patient's RN, patient, and patient's family member informed to notify rapid response if patient's condition does not improve or worsens.  Event Summary: Name of Physician Notified: Dr. Margaretmary Eddy at 1609    at    Outcome: Stayed in room and stabalized  Event End Time: [place], [place]

## 2016-02-17 NOTE — Progress Notes (Signed)
Still waiting for cardizem drip to come up from pharmacy. Have been in contact with pharmacist and they are having to hand mix it with what they have in formulary. Heart rate is still 173. Will initiate drip as soon as it arrives.

## 2016-02-17 NOTE — Progress Notes (Signed)
FIRST HD TREATMENT 1.26.2018    02/04/2016 1756 01/30/2016 1800 02/10/2016 1830  During Hemodialysis Assessment  Blood Flow Rate (mL/min) 200 mL/min 200 mL/min 200 mL/min  Arterial Pressure (mmHg) -50 mmHg -50 mmHg -50 mmHg  Venous Pressure (mmHg) 70 mmHg 70 mmHg 70 mmHg  Transmembrane Pressure (mmHg) 60 mmHg 60 mmHg 60 mmHg  Ultrafiltration Rate (mL/min) 750 mL/min 750 mL/min 750 mL/min  Dialysate Flow Rate (mL/min) 500 ml/min 500 ml/min 500 ml/min  Conductivity: Machine  14 14 14   HD Safety Checks Performed Yes Yes Yes  Dialysis Fluid Bolus Normal Saline --  --   Bolus Amount (mL) 250 mL --  --   Dialysate Change 2K (Serum Potassium=5.3,2k per protocol) --  --   Intra-Hemodialysis Comments First hemodialysis treatment started without complications.Access secured/visible.Dialyzing in bed. 186ml.Marland KitchenMarland KitchenTolerating treatment well 478ml...resting with eyes closed.     01/24/2016 1900 01/24/2016 1930 01/26/2016 2000  During Hemodialysis Assessment  Blood Flow Rate (mL/min) 200 mL/min 200 mL/min 200 mL/min  Arterial Pressure (mmHg) -50 mmHg -60 mmHg -60 mmHg  Venous Pressure (mmHg) 70 mmHg 70 mmHg 70 mmHg  Transmembrane Pressure (mmHg) 60 mmHg 70 mmHg 70 mmHg  Ultrafiltration Rate (mL/min) 750 mL/min 750 mL/min 750 mL/min  Dialysate Flow Rate (mL/min) 500 ml/min 500 ml/min 500 ml/min  Conductivity: Machine  14 14 14   HD Safety Checks Performed Yes Yes Yes  Dialysis Fluid Bolus --  --  Normal Saline  Bolus Amount (mL) --  --  250 mL  Dialysate Change --  --  --   Intra-Hemodialysis Comments 842ml...alert,talking with staff 1340ml...no complaints voiced. 1533ml...tx completed.

## 2016-02-17 NOTE — Progress Notes (Signed)
Cardizem drip is currently going at 10mg /hr. Heart rate has not decreased from 173 at all. Cardizem pushes did not affect heart rate very much and drip is not changing heart rate. MD paged and Dr. Margaretmary Eddy placed new orders to place patient on amiodarone drip instead and PRN metoprolol pushes.

## 2016-02-17 NOTE — Care Management (Signed)
Results for HARTLYN, RADAKER (MRN [id number]) as of 02/17/2016 12:08  Ref. Range 02/02/2016 11:55 02/05/2016 12:50  Influenza A By PCR Latest Ref Range: NEGATIVE  NEGATIVE   Hepatitis B Surface Ag Latest Ref Range: Negative   Negative  Hep B S Ab Unknown  Non Reactive  Hep B Core Ab, Tot Latest Ref Range: Negative   Negative  HCV Ab Latest Ref Range: 0.0 - 0.9 s/co ratio  <0.1

## 2016-02-17 NOTE — Progress Notes (Signed)
THIRD HD TREATMENT 1.28.2018    02/16/16 1428 02/16/16 1448 02/16/16 1500  During Hemodialysis Assessment  Blood Flow Rate (mL/min) 150 mL/min 250 mL/min 250 mL/min  Arterial Pressure (mmHg) -40 mmHg -90 mmHg -90 mmHg  Venous Pressure (mmHg) 50 mmHg 90 mmHg 90 mmHg  Transmembrane Pressure (mmHg) 60 mmHg 50 mmHg 50 mmHg  Ultrafiltration Rate (mL/min) 670 mL/min 670 mL/min 670 mL/min  Dialysate Flow Rate (mL/min) 500 ml/min 500 ml/min 500 ml/min  Conductivity: Machine  14 15.3 14  HD Safety Checks Performed Yes Yes Yes  Dialysis Fluid Bolus Normal Saline --  --   Bolus Amount (mL) 250 mL --  --   Dialysate Change (3k 2.5ca) --  --   Intra-Hemodialysis Comments 250cc prime, initiated via R Fem temp cath. no bleeding noted with dressing change. no heparin tx. AFIB on monitor ranging from 110-158 non sustained. Dr. Candiss Norse Aware.  205 mL removed. Pt sleeping 396 mL removed. No change in HR.      02/16/16 1519 02/16/16 1530 02/16/16 1559  During Hemodialysis Assessment  Blood Flow Rate (mL/min) --  250 mL/min 250 mL/min  Arterial Pressure (mmHg) --  -90 mmHg -90 mmHg  Venous Pressure (mmHg) --  100 mmHg 90 mmHg  Transmembrane Pressure (mmHg) --  50 mmHg 50 mmHg  Ultrafiltration Rate (mL/min) --  670 mL/min 670 mL/min  Dialysate Flow Rate (mL/min) --  500 ml/min 500 ml/min  Conductivity: Machine  --  15.3 (olc) 14.3  HD Safety Checks Performed --  Yes Yes  Dialysis Fluid Bolus --  --  --   Bolus Amount (mL) --  --  --   Dialysate Change --  --  --   Intra-Hemodialysis Comments HR improved. Pt sleeping 726 mL removed. pt sleeping. no change 986 mL removed

## 2016-02-17 NOTE — Progress Notes (Signed)
Given 2 metoprolol pushes 5mg  each and started amio drip at 1805. Heart rate is still currenly 170. Current pressure is 102/75. Dr. Clayborn Bigness paged with no return call. On call MD, Sainani paged, MD put in orders to transfer patient to the unit and patient will go back on cardizem drip along with amio drip. Charge nurse notified to put in bed request. Will continue to monitor patient.

## 2016-02-17 NOTE — Progress Notes (Signed)
Report called to CCU charge nurse, Judson Roch, Patient transporting now on amiodarone drip. Belongings packed and daughter updated.

## 2016-02-17 NOTE — Progress Notes (Signed)
Subjective: Patient overall feeling better as compared to admission. She had her third dialysis treatment yesterday. We will plan for dialysis again tomorrow. Outpatient dialysis placement pending.     Weight trends: Filed Weights   02/15/16 1449 02/16/16 1427 02/16/16 1728  Weight: 64 kg (141 lb 1.5 oz) 64.7 kg (142 lb 10.2 oz) 63.1 kg (139 lb 1.8 oz)    Physical Exam: General: No acute distress  HEENT Anicteric, moist oral mucous membranes  Neck: supple, no masses  Lungs: Mild basilar rales  Heart:: irregular rate and rhythm  Abdomen: Soft, nontender, nondistended  Extremities: One plus lower extremity edema  Neurologic: Awake, alert, following commands  Skin: Warm, dry, normal turgor  Access: Right femoral temp cath          Lab results: Basic Metabolic Panel:  Recent Labs Lab 01/22/2016 2010  02/05/2016 1532 01/31/2016 0538 02/16/16 0516  NA 132*  < > 126* 125* 131*  K 4.8  < > 4.8 5.3* 4.1  CL 105  < > 100* 97* 96*  CO2 20*  < > 19* 18* 25  GLUCOSE 126*  < > 146* 160* 121*  BUN 78*  < > 79* 80* 35*  CREATININE 5.10*  < > 5.45* 6.10* 4.39*  CALCIUM 7.9*  < > 7.6* 7.8* 7.5*  MG 2.6*  --   --   --   --   < > = values in this interval not displayed.  Liver Function Tests:  Recent Labs Lab 02/18/2016 1117  AST 36  ALT 29  ALKPHOS 61  BILITOT 0.7  PROT 7.2  ALBUMIN 2.7*   No results for input(s): LIPASE, AMYLASE in the last 168 hours. No results for input(s): AMMONIA in the last 168 hours.  CBC:  Recent Labs Lab 02/12/2016 1117  02/15/16 1023 02/16/16 0516  WBC 6.5  < > 10.3 9.0  NEUTROABS 5.3  --   --   --   HGB 9.7*  < > 8.7* 8.2*  HCT 28.2*  < > 25.8* 23.8*  MCV 82.5  < > 83.1 81.5  PLT 313  < > 322 303  < > = values in this interval not displayed.  Cardiac Enzymes:  Recent Labs Lab 02/12/16 0159  TROPONINI 0.06*    BNP: Invalid input(s): POCBNP  CBG:  Recent Labs Lab 02/16/16 1120 02/16/16 1811 02/16/16 2105 02/17/16 0722  02/17/16 1154  GLUCAP 182* 108* 130* 118* 130*    Microbiology: Recent Results (from the past 720 hour(s))  Blood culture (routine x 2)     Status: Abnormal   Collection Time: 02/10/2016 12:31 PM  Result Value Ref Range Status   Specimen Description BLOOD RIGHT AC  Final   Special Requests   Final    BOTTLES DRAWN AEROBIC AND ANAEROBIC AER 11ML ANA 8ML   Culture  Setup Time   Final    GRAM POSITIVE COCCI AEROBIC BOTTLE ONLY CRITICAL RESULT CALLED TO, READ BACK BY AND VERIFIED WITH:  Severn AT [id number] 01/24/2016 SDR    Culture (A)  Final    FACKLAMIA HOMINIS Standardized susceptibility testing for this organism is not available. Performed at [facility], [place] [address] [place], [place] [address]    Report Status 02/17/2016 FINAL  Final  Blood Culture ID Panel (Reflexed)     Status: None   Collection Time: 02/10/2016 12:31 PM  Result Value Ref Range Status   Enterococcus species NOT DETECTED NOT DETECTED Final   Listeria monocytogenes NOT DETECTED  NOT DETECTED Final   Staphylococcus species NOT DETECTED NOT DETECTED Final   Staphylococcus aureus NOT DETECTED NOT DETECTED Final   Streptococcus species NOT DETECTED NOT DETECTED Final   Streptococcus agalactiae NOT DETECTED NOT DETECTED Final   Streptococcus pneumoniae NOT DETECTED NOT DETECTED Final   Streptococcus pyogenes NOT DETECTED NOT DETECTED Final   Acinetobacter baumannii NOT DETECTED NOT DETECTED Final   Enterobacteriaceae species NOT DETECTED NOT DETECTED Final   Enterobacter cloacae complex NOT DETECTED NOT DETECTED Final   Escherichia coli NOT DETECTED NOT DETECTED Final   Klebsiella oxytoca NOT DETECTED NOT DETECTED Final   Klebsiella pneumoniae NOT DETECTED NOT DETECTED Final   Proteus species NOT DETECTED NOT DETECTED Final   Serratia marcescens NOT DETECTED NOT DETECTED Final   Haemophilus influenzae NOT DETECTED NOT DETECTED Final   Neisseria meningitidis NOT DETECTED NOT DETECTED Final   Pseudomonas  aeruginosa NOT DETECTED NOT DETECTED Final   Candida albicans NOT DETECTED NOT DETECTED Final   Candida glabrata NOT DETECTED NOT DETECTED Final   Candida krusei NOT DETECTED NOT DETECTED Final   Candida parapsilosis NOT DETECTED NOT DETECTED Final   Candida tropicalis NOT DETECTED NOT DETECTED Final  Blood culture (routine x 2)     Status: None   Collection Time: 02/17/2016 12:32 PM  Result Value Ref Range Status   Specimen Description BLOOD LEFT HAND  Final   Special Requests   Final    BOTTLES DRAWN AEROBIC AND ANAEROBIC AER 13ML ANA 13ML   Culture NO GROWTH 5 DAYS  Final   Report Status 02/16/2016 FINAL  Final  MRSA PCR Screening     Status: None   Collection Time: 01/20/2016 10:32 PM  Result Value Ref Range Status   MRSA by PCR NEGATIVE NEGATIVE Final    Comment:        The GeneXpert MRSA Assay (FDA approved for NASAL specimens only), is one component of a comprehensive MRSA colonization surveillance program. It is not intended to diagnose MRSA infection nor to guide or monitor treatment for MRSA infections.   Culture, expectorated sputum-assessment     Status: None   Collection Time: 01/31/2016  3:24 PM  Result Value Ref Range Status   Specimen Description SPUTUM  Final   Special Requests Normal  Final   Sputum evaluation   Final    Sputum specimen not acceptable for testing.  Please recollect.   RESULT CALLED TO, READ BACK BY AND VERIFIED WITH: TARA [facility] 02/19/2016 1608 KLW    Report Status 02/05/2016 FINAL  Final     Coagulation Studies:  Recent Labs  02/15/16 1023 02/16/16 0516 02/17/16 0415  LABPROT 31.9* 27.7* 23.2*  INR 3.01 2.53 2.02    Urinalysis: No results for input(s): COLORURINE, LABSPEC, PHURINE, GLUCOSEU, HGBUR, BILIRUBINUR, KETONESUR, PROTEINUR, UROBILINOGEN, NITRITE, LEUKOCYTESUR in the last 72 hours.  Invalid input(s): APPERANCEUR      Imaging: US Venous Img Upper Uni Left  Result Date: 02/17/2016 CLINICAL DATA:  Left upper extremity  swelling for the past 2 days. History diabetes. Evaluate for DVT. EXAM: LEFT UPPER EXTREMITY VENOUS DOPPLER ULTRASOUND TECHNIQUE: Gray-scale sonography with graded compression, as well as color Doppler and duplex ultrasound were performed to evaluate the upper extremity deep venous system from the level of the subclavian vein and including the jugular, axillary, basilic, radial, ulnar and upper cephalic vein. Spectral Doppler was utilized to evaluate flow at rest and with distal augmentation maneuvers. COMPARISON:  Right lower extremity venous Doppler ultrasound - 01/24/2015 FINDINGS: Contralateral Subclavian  Vein: Respiratory phasicity is normal and symmetric with the symptomatic side. No evidence of thrombus. Normal compressibility. Internal Jugular Vein: No evidence of thrombus. Normal compressibility, respiratory phasicity and response to augmentation. Subclavian Vein: No evidence of thrombus. Normal compressibility, respiratory phasicity and response to augmentation. Axillary Vein: No evidence of thrombus. Normal compressibility, respiratory phasicity and response to augmentation. Cephalic Vein: No evidence of thrombus. Normal compressibility, respiratory phasicity and response to augmentation. Basilic Vein: No evidence of thrombus. Normal compressibility, respiratory phasicity and response to augmentation. Brachial Veins: No evidence of thrombus. Normal compressibility, respiratory phasicity and response to augmentation. Radial Veins: No evidence of thrombus. Normal compressibility, respiratory phasicity and response to augmentation. Ulnar Veins: No evidence of thrombus. Normal compressibility, respiratory phasicity and response to augmentation. Venous Reflux:  None visualized. Other Findings:  None visualized. IMPRESSION: No evidence of DVT within the left upper extremity. Electronically Signed   By: Sandi Mariscal M.D.   On: 02/17/2016 10:33   Dg Chest Port 1 View  Result Date: 02/15/2016 CLINICAL DATA:   Asthma.  Shortness of breath. EXAM: PORTABLE CHEST 1 VIEW COMPARISON:  01/21/2016 FINDINGS: The cardiac silhouette is enlarged with preferential large amount of the right heart border. Mediastinal contours appear intact. No evidence of pneumothorax. Small right pleural effusion. Tiny left pleural effusion cannot be excluded. Mild increase of the interstitial markings. Osseous structures are without acute abnormality. Soft tissues are grossly normal. IMPRESSION: Enlargement of the cardiac silhouette with abnormality of the right heart border. This may represent predominantly right heart enlargement, however overlapping hilar soft tissue mass cannot be excluded. Further evaluation with chest CT may be considered if found clinically appropriate. Smaller right and tiny left pleural effusions. Mild pulmonary edema. Electronically Signed   By: Fidela Salisbury M.D.   On: 02/15/2016 16:05     Assessment & Plan: Pt is a 83 y.o. 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 ESRD/ [facility] Started with HD  -  Patient completed hemodialysis yesterday.  We will plan for fourth dialysis treatment tomorrow.  Outpatient placement in [place] pending.  Patient will also need a PermCath prior to discharge.  2. Shortness of breath, pulm edema Symptomatically improved.  Dialysis may have assisted with this.  Continue ultrafiltration with dialysis.   3.  Lower extremity edema Low-salt diet UF with HD  4. Hyperkalemia and hyponatremia Potassium down to 4.1 yesterday.  Serum sodium still a bit low at 131.  Continue to monitor.  5. Bleeding around cathter site -  Hopefully we'll get PermCath later this week.

## 2016-02-17 NOTE — Progress Notes (Signed)
Patient sustained at 172 in SVT. Dr. Margaretmary Eddy paged and MD stated to push 6mg  of adenosine. Rapid Response called. Team arrived and adenosine was pushed. Heart rate came down to 80's but went back up to 170 again. Dr. Margaretmary Eddy came to unit and stated to push 12mg . Patient then went back into Afib RVR. MD ordered to give another cardizem push and to place patient on cardizem drip. Will continue to closely monitor.

## 2016-02-17 NOTE — Progress Notes (Signed)
[place]  SUBJECTIVE: [place] ok   Vitals:   02/16/16 1811 02/16/16 2019 02/17/16 0426 02/17/16 1158  BP: 126/71 124/88 (!) 162/65 (!) 154/69  Pulse: 87 (!) 152 (!) 108 (!) 125  Resp: 12 18 (!) 22 18  Temp: 98 F (36.7 C) 98.2 F (36.8 C) 98.9 F (37.2 C) 98.1 F (36.7 C)  TempSrc: Oral Oral Oral Oral  SpO2: 100% 98% 96% 100%  Weight:      Height:        Intake/Output Summary (Last 24 hours) at 02/17/16 1253 Last data filed at 02/17/16 1047  Gross per 24 hour  Intake              483 ml  Output             2400 ml  Net            -1917 ml    LABS: Basic Metabolic Panel:  Recent Labs  02/16/16 0516  NA 131*  K 4.1  CL 96*  CO2 25  GLUCOSE 121*  BUN 35*  CREATININE 4.39*  CALCIUM 7.5*   Liver Function Tests: No results for input(s): AST, ALT, ALKPHOS, BILITOT, PROT, ALBUMIN in the last 72 hours. No results for input(s): LIPASE, AMYLASE in the last 72 hours. CBC:  Recent Labs  02/15/16 1023 02/16/16 0516  WBC 10.3 9.0  HGB 8.7* 8.2*  HCT 25.8* 23.8*  MCV 83.1 81.5  PLT 322 303   Cardiac Enzymes: No results for input(s): CKTOTAL, CKMB, CKMBINDEX, TROPONINI in the last 72 hours. BNP: Invalid input(s): POCBNP D-Dimer: No results for input(s): DDIMER in the last 72 hours. Hemoglobin A1C: No results for input(s): HGBA1C in the last 72 hours. Fasting Lipid Panel: No results for input(s): CHOL, HDL, LDLCALC, TRIG, CHOLHDL, LDLDIRECT in the last 72 hours. Thyroid Function Tests: No results for input(s): TSH, T4TOTAL, T3FREE, THYROIDAB in the last 72 hours.  Invalid input(s): FREET3 Anemia Panel: No results for input(s): VITAMINB12, FOLATE, FERRITIN, TIBC, IRON, RETICCTPCT in the last 72 hours.   Physical Exam: Blood pressure (!) 154/69, pulse (!) 125, temperature 98.1 F (36.7 C), temperature source Oral, resp. rate 18, height 5' (1.524 m), weight 139 lb 1.8 oz (63.1 kg), SpO2 100 %.   Wt Readings from Last 1  Encounters:  02/16/16 139 lb 1.8 oz (63.1 kg)     General appearance: alert and cooperative Resp: rhonchi bibasilar Cardio: irregularly irregular rhythm GI: soft, non-tender; bowel sounds normal; no masses,  no organomegaly Neurologic: Grossly normal  TELEMETRY: Reviewed telemetry pt in afib  ASSESSMENT AND PLAN:  Active Problems:   ARF (acute renal failure) (HCC)-hd per nephrology  lue edema-ultrasound revealed no dvt.  afib-will continue to control rate and anticoagulate. Will not attempt repeat cardioversion at presnt.     Teodoro Spray, MD, [facility] 02/17/2016 12:53 PM

## 2016-02-17 NOTE — Progress Notes (Signed)
   02/16/16 1213 02/16/16 1607  PPD Results  Does patient have an induration at the injection site? No No  Induration(mm) 0 mm 0 mm  Name of Physician Notified Gouru Gouru

## 2016-02-17 NOTE — Progress Notes (Signed)
Per Dr. Margaretmary Eddy patient's is to have a perm cath placed and cannot take coumadin at this time. Pharmacist started coumadin back yesterday based on patient's INR. Orders discontinued for coumadin and pharmacist on unit notified.

## 2016-02-17 NOTE — Progress Notes (Signed)
THIRD HD TREATMENT 1.28.2018 CONT'D    02/16/16 1630 02/16/16 1700 02/16/16 1728  During Hemodialysis Assessment  Blood Flow Rate (mL/min) 250 mL/min 250 mL/min --   Arterial Pressure (mmHg) -90 mmHg -90 mmHg --   Venous Pressure (mmHg) 90 mmHg 90 mmHg --   Transmembrane Pressure (mmHg) 50 mmHg 50 mmHg --   Ultrafiltration Rate (mL/min) 670 mL/min 670 mL/min --   Dialysate Flow Rate (mL/min) 500 ml/min 500 ml/min --   Conductivity: Machine  14.2 14.3 --   HD Safety Checks Performed Yes Yes --   Intra-Hemodialysis Comments 1326 mL removed. Pt sleeping 1698 mL removed. pt sleeping 2000 mL removed. Rinseback

## 2016-02-17 NOTE — Progress Notes (Signed)
Pt. Noted to be in rapid a. Fib w/ RVR this afternoon. Has gotten Adenosine, Cardizem with no response.  Then placed on CArdizem gtt and then on Amio gtt. Continues to be tachycardic and now somewhat hypotensive.    - Will transfer to Step down and place on both Amio, Cardizem gtt.  - await answer from Cardiology (Dr. Clayborn Bigness) following.   Cannot give Dig. Given Renal Failure (ESRD).   Discussed w/ Nursing staff.

## 2016-02-17 NOTE — Progress Notes (Signed)
[place] [place] INFECTIOUS DISEASE PROGRESS NOTE Date of Admission:  02/14/2016     ID: Joan Erickson is a 83 y.o. female with bacteremia, ARF, SOB  Active Problems:   ARF (acute renal failure) (HCC)   Subjective: No fevers. Remains off abx. Has been getting HD session. Had recurrent Afib with RVR  ROS  Eleven systems are reviewed and negative except per hpi  Medications:  Antibiotics Given (last 72 hours)    None     . citalopram  10 mg Oral Daily  . diltiazem  360 mg Oral Daily  . feeding supplement (ENSURE ENLIVE)  237 mL Oral BID BM  . finasteride  5 mg Oral Daily  . fluticasone  2 spray Each Nare Daily  . insulin aspart  0-5 Units Subcutaneous QHS  . insulin aspart  0-9 Units Subcutaneous TID WC  . mometasone-formoterol  2 puff Inhalation BID  . montelukast  10 mg Oral Daily  . pravastatin  20 mg Oral q1800  . sodium chloride flush  3 mL Intravenous Q12H    Objective: Vital signs in last 24 hours: Temp:  [97.9 F (36.6 C)-98.9 F (37.2 C)] 98.1 F (36.7 C) (01/29 1158) Pulse Rate:  [87-155] 125 (01/29 1158) Resp:  [12-22] 18 (01/29 1158) BP: (115-162)/(53-88) 154/69 (01/29 1158) SpO2:  [96 %-100 %] 100 % (01/29 1158) Weight:  [63.1 kg (139 lb 1.8 oz)-64.7 kg (142 lb 10.2 oz)] 63.1 kg (139 lb 1.8 oz) (01/28 1728) Constitutional:  oriented to person, place, and time. appears well-developed and well-nourished. No distress.  HENT: [place]/AT, PERRLA, no scleral icterus Mouth/Throat: Oropharynx is clear and moist. No oropharyngeal exudate.  Cardiovascular: Normal rate, regular rhythm and normal heart sounds.  Pulmonary/Chest: poor air movement  Neck = supple, no nuchal rigidity Abdominal: Soft. Bowel sounds are normal.  exhibits no distension. There is no tenderness.  Lymphadenopathy: no cervical adenopathy. No axillary adenopathy Neurological: alert and oriented to person, place, and time.  Skin: Skin is warm and dry. No rash noted. No erythema.  Ext 2+  edema Psychiatric: a normal mood and affect.  behavior is normal.   Lab Results  Recent Labs  02/15/16 1023 02/16/16 0516  WBC 10.3 9.0  HGB 8.7* 8.2*  HCT 25.8* 23.8*  NA  --  131*  K  --  4.1  CL  --  96*  CO2  --  25  BUN  --  35*  CREATININE  --  4.39*    Microbiology: Results for orders placed or performed during the hospital encounter of 01/22/2016  Blood culture (routine x 2)     Status: Abnormal   Collection Time: 01/24/2016 12:31 PM  Result Value Ref Range Status   Specimen Description BLOOD RIGHT AC  Final   Special Requests   Final    BOTTLES DRAWN AEROBIC AND ANAEROBIC AER 11ML ANA 8ML   Culture  Setup Time   Final    GRAM POSITIVE COCCI AEROBIC BOTTLE ONLY CRITICAL RESULT CALLED TO, READ BACK BY AND VERIFIED WITH:  KAREN HAYES AT [id number] 02/12/2016 SDR    Culture (A)  Final    FACKLAMIA HOMINIS Standardized susceptibility testing for this organism is not available. Performed at [facility], [place] [address] [place], [place] [address]    Report Status 02/17/2016 FINAL  Final  Blood Culture ID Panel (Reflexed)     Status: None   Collection Time: 01/26/2016 12:31 PM  Result Value Ref Range Status   Enterococcus species  NOT DETECTED NOT DETECTED Final   Listeria monocytogenes NOT DETECTED NOT DETECTED Final   Staphylococcus species NOT DETECTED NOT DETECTED Final   Staphylococcus aureus NOT DETECTED NOT DETECTED Final   Streptococcus species NOT DETECTED NOT DETECTED Final   Streptococcus agalactiae NOT DETECTED NOT DETECTED Final   Streptococcus pneumoniae NOT DETECTED NOT DETECTED Final   Streptococcus pyogenes NOT DETECTED NOT DETECTED Final   Acinetobacter baumannii NOT DETECTED NOT DETECTED Final   Enterobacteriaceae species NOT DETECTED NOT DETECTED Final   Enterobacter cloacae complex NOT DETECTED NOT DETECTED Final   Escherichia coli NOT DETECTED NOT DETECTED Final   Klebsiella oxytoca NOT DETECTED NOT DETECTED Final   Klebsiella pneumoniae NOT  DETECTED NOT DETECTED Final   Proteus species NOT DETECTED NOT DETECTED Final   Serratia marcescens NOT DETECTED NOT DETECTED Final   Haemophilus influenzae NOT DETECTED NOT DETECTED Final   Neisseria meningitidis NOT DETECTED NOT DETECTED Final   Pseudomonas aeruginosa NOT DETECTED NOT DETECTED Final   Candida albicans NOT DETECTED NOT DETECTED Final   Candida glabrata NOT DETECTED NOT DETECTED Final   Candida krusei NOT DETECTED NOT DETECTED Final   Candida parapsilosis NOT DETECTED NOT DETECTED Final   Candida tropicalis NOT DETECTED NOT DETECTED Final  Blood culture (routine x 2)     Status: None   Collection Time: 01/30/2016 12:32 PM  Result Value Ref Range Status   Specimen Description BLOOD LEFT HAND  Final   Special Requests   Final    BOTTLES DRAWN AEROBIC AND ANAEROBIC AER 13ML ANA 13ML   Culture NO GROWTH 5 DAYS  Final   Report Status 02/16/2016 FINAL  Final  MRSA PCR Screening     Status: None   Collection Time: 02/10/2016 10:32 PM  Result Value Ref Range Status   MRSA by PCR NEGATIVE NEGATIVE Final    Comment:        The GeneXpert MRSA Assay (FDA approved for NASAL specimens only), is one component of a comprehensive MRSA colonization surveillance program. It is not intended to diagnose MRSA infection nor to guide or monitor treatment for MRSA infections.   Culture, expectorated sputum-assessment     Status: None   Collection Time: 02/19/2016  3:24 PM  Result Value Ref Range Status   Specimen Description SPUTUM  Final   Special Requests Normal  Final   Sputum evaluation   Final    Sputum specimen not acceptable for testing.  Please recollect.   RESULT CALLED TO, READ BACK BY AND VERIFIED WITH: Champ Mungo 02/04/2016 1608 KLW    Report Status 01/23/2016 FINAL  Final     Studies/Results: US Venous Img Upper Uni Left  Result Date: 02/17/2016 CLINICAL DATA:  Left upper extremity swelling for the past 2 days. History diabetes. Evaluate for DVT. EXAM: LEFT UPPER  EXTREMITY VENOUS DOPPLER ULTRASOUND TECHNIQUE: Gray-scale sonography with graded compression, as well as color Doppler and duplex ultrasound were performed to evaluate the upper extremity deep venous system from the level of the subclavian vein and including the jugular, axillary, basilic, radial, ulnar and upper cephalic vein. Spectral Doppler was utilized to evaluate flow at rest and with distal augmentation maneuvers. COMPARISON:  Right lower extremity venous Doppler ultrasound - 01/24/2015 FINDINGS: Contralateral Subclavian Vein: Respiratory phasicity is normal and symmetric with the symptomatic side. No evidence of thrombus. Normal compressibility. Internal Jugular Vein: No evidence of thrombus. Normal compressibility, respiratory phasicity and response to augmentation. Subclavian Vein: No evidence of thrombus. Normal compressibility, respiratory phasicity and response  to augmentation. Axillary Vein: No evidence of thrombus. Normal compressibility, respiratory phasicity and response to augmentation. Cephalic Vein: No evidence of thrombus. Normal compressibility, respiratory phasicity and response to augmentation. Basilic Vein: No evidence of thrombus. Normal compressibility, respiratory phasicity and response to augmentation. Brachial Veins: No evidence of thrombus. Normal compressibility, respiratory phasicity and response to augmentation. Radial Veins: No evidence of thrombus. Normal compressibility, respiratory phasicity and response to augmentation. Ulnar Veins: No evidence of thrombus. Normal compressibility, respiratory phasicity and response to augmentation. Venous Reflux:  None visualized. Other Findings:  None visualized. IMPRESSION: No evidence of DVT within the left upper extremity. Electronically Signed   By: Sandi Mariscal M.D.   On: 02/17/2016 10:33   Dg Chest Port 1 View  Result Date: 02/15/2016 CLINICAL DATA:  Asthma.  Shortness of breath. EXAM: PORTABLE CHEST 1 VIEW COMPARISON:  02/18/2016  FINDINGS: The cardiac silhouette is enlarged with preferential large amount of the right heart border. Mediastinal contours appear intact. No evidence of pneumothorax. Small right pleural effusion. Tiny left pleural effusion cannot be excluded. Mild increase of the interstitial markings. Osseous structures are without acute abnormality. Soft tissues are grossly normal. IMPRESSION: Enlargement of the cardiac silhouette with abnormality of the right heart border. This may represent predominantly right heart enlargement, however overlapping hilar soft tissue mass cannot be excluded. Further evaluation with chest CT may be considered if found clinically appropriate. Smaller right and tiny left pleural effusions. Mild pulmonary edema. Electronically Signed   By: Fidela Salisbury M.D.   On: 02/15/2016 16:05   Echo 02/12/16 Study Conclusions  - Left ventricle: The cavity size was normal. There was severe   concentric hypertrophy. Systolic function was normal. The   estimated ejection fraction was in the range of 60% to 65%. - Aortic valve: Valve area (Vmax): 2.12 cm^2. - Mitral valve: There was moderate regurgitation. - Left atrium: The atrium was mildly dilated. - Right atrium: The atrium was mildly dilated. - Tricuspid valve: There was moderate regurgitation. Assessment/Plan: Joan Erickson is a 83 y.o. female admitted with Afib RVR, Acute renal failure and PNA. No fevers, wbc nml. [place] 1/2 GPC Facklamia hominis.  Started on Ctx, azitho and vanco x one day but off since.  Given ARF had temp cath placed and getting HD.  I suspcet most of her sxs are from volume overload. BCx likely contaminant.  Her echo show no vegetation. She   Recommendations Continue to follow off abx I have ordered repeat bcx today but will consider the Donney Rankins contaminant unless grows again.  If cx neg in 48 hours would be ok to place a permacath.  If develops fevers chills, check sputum cx and consider CT chest Thank you  very much for the consult. Will follow with you.  FITZGERALD, DAVID P   02/17/2016, 1:54 PM

## 2016-02-17 NOTE — Progress Notes (Signed)
[facility] NAME: Joan Erickson    MR#:  [id number]  DATE OF BIRTH:  [birth year]  SUBJECTIVE:  Patient is doing okay but went into SVT 170s today. Patient denies any palpitations or chest pressure. Daughter at bedside  REVIEW OF SYSTEMS:   Review of Systems  Constitutional: Negative for chills, fever and weight loss.  HENT: Negative for ear discharge, ear pain and nosebleeds.   Eyes: Negative for blurred vision, pain and discharge.  Respiratory: Negative for sputum production, shortness of breath, wheezing and stridor.   Cardiovascular: Negative for chest pain, palpitations, orthopnea and PND.  Gastrointestinal: Negative for abdominal pain, diarrhea, nausea and vomiting.  Genitourinary: Negative for frequency and urgency.  Musculoskeletal: Negative for back pain and joint pain.  Neurological: Positive for weakness. Negative for sensory change, speech change and focal weakness.  Psychiatric/Behavioral: Negative for depression and hallucinations. The patient is not nervous/anxious.    Tolerating Diet:yes  DRUG ALLERGIES:  No Known Allergies  VITALS:  Blood pressure 126/72, pulse (!) 125, temperature 98.1 F (36.7 C), temperature source Oral, resp. rate 18, height 5' (1.524 m), weight 63.1 kg (139 lb 1.8 oz), SpO2 100 %.  PHYSICAL EXAMINATION:   Physical Exam  GENERAL:  83 y.o.-year-old patient lying in the bed with no acute distress.  EYES: Pupils equal, round, reactive to light and accommodation. No scleral icterus. Extraocular muscles intact.  HEENT: Head atraumatic, normocephalic. Oropharynx and nasopharynx clear.  NECK:  Supple, no jugular venous distention. No thyroid enlargement, no tenderness.  LUNGS: Normal breath sounds bilaterally, no wheezing, rales, rhonchi. No use of accessory muscles of respiration.  CARDIOVASCULAR: Irregularly irregular No murmurs, rubs, or gallops.  ABDOMEN: Soft, nontender, nondistended.  Bowel sounds present. No organomegaly or mass.  EXTREMITIES: No bleeding noticed on the right groin area at the dialysis catheter site  No cyanosis, clubbing or edema b/l.    NEUROLOGIC: Cranial nerves II through XII are intact. No focal Motor or sensory deficits b/l.   PSYCHIATRIC:  patient is alert and oriented x 3.  SKIN: No obvious rash, lesion, or ulcer.  LABORATORY PANEL:  CBC  Recent Labs Lab 02/16/16 0516  WBC 9.0  HGB 8.2*  HCT 23.8*  PLT 303    Chemistries   Recent Labs Lab 01/28/2016 1117 01/28/2016 2010  02/16/16 0516  NA 133* 132*  < > 131*  K 5.4* 4.8  < > 4.1  CL 106 105  < > 96*  CO2 20* 20*  < > 25  GLUCOSE 128* 126*  < > 121*  BUN 78* 78*  < > 35*  CREATININE 4.95* 5.10*  < > 4.39*  CALCIUM 8.1* 7.9*  < > 7.5*  MG  --  2.6*  --   --   AST 36  --   --   --   ALT 29  --   --   --   ALKPHOS 61  --   --   --   BILITOT 0.7  --   --   --   < > = values in this interval not displayed. Cardiac Enzymes  Recent Labs Lab 02/12/16 0159  TROPONINI 0.06*   RADIOLOGY:  US Venous Img Upper Uni Left  Result Date: 02/17/2016 CLINICAL DATA:  Left upper extremity swelling for the past 2 days. History diabetes. Evaluate for DVT. EXAM: LEFT UPPER EXTREMITY VENOUS DOPPLER ULTRASOUND TECHNIQUE: Gray-scale sonography with graded compression, as well as color Doppler and  duplex ultrasound were performed to evaluate the upper extremity deep venous system from the level of the subclavian vein and including the jugular, axillary, basilic, radial, ulnar and upper cephalic vein. Spectral Doppler was utilized to evaluate flow at rest and with distal augmentation maneuvers. COMPARISON:  Right lower extremity venous Doppler ultrasound - 01/24/2015 FINDINGS: Contralateral Subclavian Vein: Respiratory phasicity is normal and symmetric with the symptomatic side. No evidence of thrombus. Normal compressibility. Internal Jugular Vein: No evidence of thrombus. Normal compressibility,  respiratory phasicity and response to augmentation. Subclavian Vein: No evidence of thrombus. Normal compressibility, respiratory phasicity and response to augmentation. Axillary Vein: No evidence of thrombus. Normal compressibility, respiratory phasicity and response to augmentation. Cephalic Vein: No evidence of thrombus. Normal compressibility, respiratory phasicity and response to augmentation. Basilic Vein: No evidence of thrombus. Normal compressibility, respiratory phasicity and response to augmentation. Brachial Veins: No evidence of thrombus. Normal compressibility, respiratory phasicity and response to augmentation. Radial Veins: No evidence of thrombus. Normal compressibility, respiratory phasicity and response to augmentation. Ulnar Veins: No evidence of thrombus. Normal compressibility, respiratory phasicity and response to augmentation. Venous Reflux:  None visualized. Other Findings:  None visualized. IMPRESSION: No evidence of DVT within the left upper extremity. Electronically Signed   By: Sandi Mariscal M.D.   On: 02/17/2016 10:33   ASSESSMENT AND PLAN:  Joan Erickson  is a 83 y.o. female with a known history of A. fib, hypertension, hyperlipidemia, CKD stage 4 and asthma. The patient was treated for pneumonia with antibiotics a month ago by her primary care doctor. She has been feeling increasingly weak and shortness of breath on exertion for the past few days. Started to have a palpitation today and feels cold. Her PCP told her that her heart rate was fast and sent patient to ED. She was found A. fib with RVR at 140   *SVT Heart rate was at 170s Rapid Response called Adenosine 6 mg and 12 mg  IV was given and patient converted back to fibrillaatrial fibrillation with RVR   * Acute on chrnicA. fib with RVR. -Heart rate is fluctuating from 110-125  patient is started on Cardizem drip  echocardiogram wnl -cardiology input appreciated -Currently Coumadin on hold for permacath placement  -  ultrasound with no evidence of DVT and upper extremity  - *Acute renal failure on CKD stage 4 and hyponatremia and Hyperkalemia - progressing to stage 5/ESRD Hold chlorthalidone and losartan due to renal failure -Nephrology following - plan to place temp cathetar 02/19/2016 had hemodialysis today -creat 5.10--4.94->5.4 -Follow up with nephrology -Monitor hemoglobin 9.5-8.7--8.2  * Pneumonia - Ruled out - normal procalcitonin - stopped Abx - ID c/s as blood c/s growing GPR - likely false +  *Elevated troponin, due to  A. fib and renal failure, neg serial troponins and cardiology following, hold Coumadin. INR at 2.0   *Hyponatremia: Na improved from 125-131  nephro following.  Patient had hemodialysis 1/26, 1/27  *Diabetes: sliding scale. Hold Tradjenta  *Insomnia Benadryl as needed  Case discussed with Care Management/Social Worker. Management plans discussed with the patient,  daughter and they are in agreement.   CODE STATUS: full  DVT Prophylaxis: SCDs  TOTAL CRITICAL CARE TIME TAKING CARE OF THIS PATIENT: 41 minutes.  >50% time spent on counselling and coordination of care  POSSIBLE D/C IN 3 DAYS, DEPENDING ON CLINICAL CONDITION.  Note: This dictation was prepared with Dragon dictation along with smaller phrase technology. Any transcriptional errors that result from this process are unintentional.  Nicholes Mango M.D on 02/17/2016 at 4:42 PM  Between 7am to 6pm - Pager - [phone]  After 6pm go to [email or web address] - password [email or web address] [facility]  Office  [phone]  CC: Primary care physician; Einar Pheasant, MD

## 2016-02-17 NOTE — Progress Notes (Signed)
SECOND HD TREATMENT 1.27.2018  CONT'D    02/15/16 1421 02/15/16 1433 02/15/16 1449  During Hemodialysis Assessment  Blood Flow Rate (mL/min) 250 mL/min 250 mL/min --   Arterial Pressure (mmHg) -100 mmHg -100 mmHg --   Venous Pressure (mmHg) 90 mmHg 100 mmHg --   Transmembrane Pressure (mmHg) 50 mmHg 60 mmHg --   Ultrafiltration Rate (mL/min) 600 mL/min 600 mL/min --   Dialysate Flow Rate (mL/min) 500 ml/min 500 ml/min --   Conductivity: Machine  14.2 14.2 --   HD Safety Checks Performed Yes Yes --   Intra-Hemodialysis Comments 1222 mL removed, pt sleeping 1345 mL removed Rinseback

## 2016-02-17 NOTE — Care Management (Signed)
Barrier- repeat blood culture- if comes back negative will proceed with perm cath.  It is thought the positive blood culture may be contaminent.

## 2016-02-17 NOTE — Care Management (Signed)
Sent the three HD assessment treatment notes, nephrology progress notes, PPD and hepatitis results face sheet, ekg and social to [place] with [facility]. Received confirmation of fax

## 2016-02-18 ENCOUNTER — Inpatient Hospital Stay: Payer: Medicare Other

## 2016-02-18 LAB — BASIC METABOLIC PANEL
Anion gap: 4 — ABNORMAL LOW (ref 5–15)
Anion gap: 8 (ref 5–15)
BUN: 31 mg/dL — AB (ref 6–20)
BUN: 34 mg/dL — AB (ref 6–20)
CALCIUM: 7.9 mg/dL — AB (ref 8.9–10.3)
CHLORIDE: 95 mmol/L — AB (ref 101–111)
CO2: 29 mmol/L (ref 22–32)
CO2: 24 mmol/L (ref 22–32)
CREATININE: 3.78 mg/dL — AB (ref 0.44–1.00)
Calcium: 8 mg/dL — ABNORMAL LOW (ref 8.9–10.3)
Chloride: 97 mmol/L — ABNORMAL LOW (ref 101–111)
Creatinine, Ser: 3.86 mg/dL — ABNORMAL HIGH (ref 0.44–1.00)
GFR calc Af Amer: 12 mL/min — ABNORMAL LOW (ref >60)
GFR calc Af Amer: 12 mL/min — ABNORMAL LOW (ref >60)
GFR calc non Af Amer: 10 mL/min — ABNORMAL LOW (ref >60)
GFR, EST NON AFRICAN AMERICAN: 10 mL/min — AB (ref >60)
GLUCOSE: 180 mg/dL — AB (ref 65–99)
Glucose, Bld: 219 mg/dL — ABNORMAL HIGH (ref 65–99)
Potassium: 4.4 mmol/L (ref 3.5–5.1)
Potassium: 4.3 mmol/L (ref 3.5–5.1)
SODIUM: 128 mmol/L — AB (ref 135–145)
SODIUM: 129 mmol/L — AB (ref 135–145)

## 2016-02-18 LAB — PROTIME-INR
INR: 1.69
PROTHROMBIN TIME: 20.1 s — AB (ref 11.4–15.2)

## 2016-02-18 LAB — GLUCOSE, CAPILLARY
GLUCOSE-CAPILLARY: 119 mg/dL — AB (ref 65–99)
GLUCOSE-CAPILLARY: 98 mg/dL (ref 65–99)
GLUCOSE-CAPILLARY: 112 mg/dL — AB (ref 65–99)
Glucose-Capillary: 153 mg/dL — ABNORMAL HIGH (ref 65–99)
Glucose-Capillary: 163 mg/dL — ABNORMAL HIGH (ref 65–99)

## 2016-02-18 LAB — CBC WITH DIFFERENTIAL/PLATELET
Basophils Absolute: 0 10*3/uL (ref 0–0.1)
Basophils Relative: 0 %
EOS ABS: 0 10*3/uL (ref 0–0.7)
Eosinophils Relative: 0 %
HEMATOCRIT: 25.1 % — AB (ref 35.0–47.0)
HEMOGLOBIN: 8.5 g/dL — AB (ref 12.0–16.0)
LYMPHS ABS: 0.3 10*3/uL — AB (ref 1.0–3.6)
Lymphocytes Relative: 3 %
MCH: 28.3 pg (ref 26.0–34.0)
MCHC: 33.7 g/dL (ref 32.0–36.0)
MCV: 83.9 fL (ref 80.0–100.0)
MONO ABS: 0.6 10*3/uL (ref 0.2–0.9)
MONOS PCT: 7 %
NEUTROS PCT: 90 %
Neutro Abs: 8.3 10*3/uL — ABNORMAL HIGH (ref 1.4–6.5)
Platelets: 368 10*3/uL (ref 150–440)
RBC: 3 MIL/uL — ABNORMAL LOW (ref 3.80–5.20)
RDW: 16.3 % — ABNORMAL HIGH (ref 11.5–14.5)
WBC: 9.2 10*3/uL (ref 3.6–11.0)

## 2016-02-18 LAB — MAGNESIUM: MAGNESIUM: 2.1 mg/dL (ref 1.7–2.4)

## 2016-02-18 LAB — TROPONIN I
TROPONIN I: 0.07 ng/mL — AB (ref <0.03)
Troponin I: 0.05 ng/mL (ref <0.03)
Troponin I: 0.06 ng/mL (ref <0.03)

## 2016-02-18 LAB — CBC
HCT: 24.5 % — ABNORMAL LOW (ref 35.0–47.0)
Hemoglobin: 8.1 g/dL — ABNORMAL LOW (ref 12.0–16.0)
MCH: 27.9 pg (ref 26.0–34.0)
MCHC: 33 g/dL (ref 32.0–36.0)
MCV: 84.6 fL (ref 80.0–100.0)
PLATELETS: 308 10*3/uL (ref 150–440)
RBC: 2.9 MIL/uL — ABNORMAL LOW (ref 3.80–5.20)
RDW: 16.6 % — AB (ref 11.5–14.5)
WBC: 6.7 10*3/uL (ref 3.6–11.0)

## 2016-02-18 LAB — PHOSPHORUS: PHOSPHORUS: 5.9 mg/dL — AB (ref 2.5–4.6)

## 2016-02-18 LAB — BRAIN NATRIURETIC PEPTIDE: B Natriuretic Peptide: 2830 pg/mL — ABNORMAL HIGH (ref 0.0–100.0)

## 2016-02-18 MED ORDER — FUROSEMIDE 10 MG/ML IJ SOLN
40.0000 mg | Freq: Once | INTRAMUSCULAR | Status: AC
  Administered 2016-02-18: 40 mg via INTRAVENOUS

## 2016-02-18 MED ORDER — NITROGLYCERIN 0.4 MG SL SUBL
SUBLINGUAL_TABLET | SUBLINGUAL | Status: AC
  Administered 2016-02-18: 0.4 mg via SUBLINGUAL
  Filled 2016-02-18: qty 1

## 2016-02-18 MED ORDER — VANCOMYCIN HCL 10 G IV SOLR
1250.0000 mg | Freq: Once | INTRAVENOUS | Status: AC
  Administered 2016-02-18: 1250 mg via INTRAVENOUS
  Filled 2016-02-18: qty 1250

## 2016-02-18 MED ORDER — VANCOMYCIN HCL IN DEXTROSE 1-5 GM/200ML-% IV SOLN
1000.0000 mg | Freq: Once | INTRAVENOUS | Status: DC
  Filled 2016-02-18: qty 200

## 2016-02-18 MED ORDER — FUROSEMIDE 10 MG/ML IJ SOLN
INTRAMUSCULAR | Status: AC
  Administered 2016-02-18: 40 mg via INTRAVENOUS
  Filled 2016-02-18: qty 4

## 2016-02-18 MED ORDER — HEPARIN SODIUM (PORCINE) 5000 UNIT/ML IJ SOLN
5000.0000 [IU] | Freq: Three times a day (TID) | INTRAMUSCULAR | Status: DC
  Administered 2016-02-18 – 2016-02-24 (×16): 5000 [IU] via SUBCUTANEOUS
  Filled 2016-02-18 (×16): qty 1

## 2016-02-18 MED ORDER — NITROGLYCERIN 0.4 MG SL SUBL
0.4000 mg | SUBLINGUAL_TABLET | SUBLINGUAL | Status: DC | PRN
  Administered 2016-02-18 (×2): 0.4 mg via SUBLINGUAL

## 2016-02-18 MED ORDER — VANCOMYCIN HCL 500 MG IV SOLR
500.0000 mg | INTRAVENOUS | Status: DC | PRN
  Filled 2016-02-18: qty 500

## 2016-02-18 MED ORDER — PIPERACILLIN-TAZOBACTAM 3.375 G IVPB
3.3750 g | Freq: Two times a day (BID) | INTRAVENOUS | Status: DC
  Administered 2016-02-18 (×2): 3.375 g via INTRAVENOUS
  Filled 2016-02-18 (×2): qty 50

## 2016-02-18 NOTE — Progress Notes (Signed)
Pharmacy Antibiotic Note  Joan Erickson is a 83 y.o. female admitted on 01/27/2016 with pneumonia.  Pharmacy has been consulted for Zosyn and vancomycin dosing.  Plan: 1. Continue Zosyn 3.375 gm IV Q12H EI. 2. Vancomycin 500 mg iv q HD placed as prn order with plans for patient to receive HD this PM.   Height: 5' (152.4 cm) Weight: 139 lb 1.8 oz (63.1 kg) IBW/kg (Calculated) : 45.5  Temp (24hrs), Avg:97.9 F (36.6 C), Min:97.6 F (36.4 C), Max:98.4 F (36.9 C)   Recent Labs Lab 01/26/2016 1232  02/03/2016 1532 02/18/2016 0538 02/15/16 1023 02/16/16 0516 02/18/16 0102 02/18/16 0612  WBC  --   < > 6.9 9.5 10.3 9.0 9.2 6.7  CREATININE  --   < > 5.45* 6.10*  --  4.39* 3.78* 3.86*  LATICACIDVEN 0.8  --   --   --   --   --   --   --   < > = values in this interval not displayed.  Estimated Creatinine Clearance: 9.3 mL/min (by C-G formula based on SCr of 3.86 mg/dL (H)).    No Known Allergies  Thank you for allowing pharmacy to be a part of this patient's care.  Ulice Dash D, Pharm.D., BCPS Clinical Pharmacist 02/18/2016 10:36 AM

## 2016-02-18 NOTE — Progress Notes (Signed)
[facility] [place] NAME: Joan Erickson    MR#:  [id number]  DATE OF BIRTH:  [birth year]  SUBJECTIVE:  Patient is doing okay , transferred to ICU for atrial fibrillation with RVR overnight  Now rate  controlled   REVIEW OF SYSTEMS:   Review of Systems  Constitutional: Negative for chills, fever and weight loss.  HENT: Negative for ear discharge, ear pain and nosebleeds.   Eyes: Negative for blurred vision, pain and discharge.  Respiratory: Negative for sputum production, shortness of breath, wheezing and stridor.   Cardiovascular: Negative for chest pain, palpitations, orthopnea and PND.  Gastrointestinal: Negative for abdominal pain, diarrhea, nausea and vomiting.  Genitourinary: Negative for frequency and urgency.  Musculoskeletal: Negative for back pain and joint pain.  Neurological: Positive for weakness. Negative for sensory change, speech change and focal weakness.  Psychiatric/Behavioral: Negative for depression and hallucinations. The patient is not nervous/anxious.    Tolerating Diet:yes  DRUG ALLERGIES:  No Known Allergies  VITALS:  Blood pressure (!) 108/57, pulse 70, temperature 97.7 F (36.5 C), temperature source Oral, resp. rate 12, height 5' (1.524 m), weight 63.1 kg (139 lb 1.8 oz), SpO2 100 %.  PHYSICAL EXAMINATION:   Physical Exam  GENERAL:  83 y.o.-year-old patient lying in the bed with no acute distress.  EYES: Pupils equal, round, reactive to light and accommodation. No scleral icterus. Extraocular muscles intact.  HEENT: Head atraumatic, normocephalic. Oropharynx and nasopharynx clear.  NECK:  Supple, no jugular venous distention. No thyroid enlargement, no tenderness.  LUNGS: Normal breath sounds bilaterally, no wheezing, rales, rhonchi. No use of accessory muscles of respiration.  CARDIOVASCULAR: Irregularly irregular No murmurs, rubs, or gallops.  ABDOMEN: Soft, nontender, nondistended. Bowel sounds  present. No organomegaly or mass.  EXTREMITIES: No bleeding noticed on the right groin area at the dialysis catheter site  No cyanosis, clubbing or edema b/l.    NEUROLOGIC: Cranial nerves II through XII are intact. No focal Motor or sensory deficits b/l.   PSYCHIATRIC:  patient is alert and oriented x 3.  SKIN: No obvious rash, lesion, or ulcer.  LABORATORY PANEL:  CBC  Recent Labs Lab 02/18/16 0612  WBC 6.7  HGB 8.1*  HCT 24.5*  PLT 308    Chemistries   Recent Labs Lab 02/18/16 0612  NA 129*  K 4.3  CL 97*  CO2 24  GLUCOSE 180*  BUN 34*  CREATININE 3.86*  CALCIUM 7.9*  MG 2.1   Cardiac Enzymes  Recent Labs Lab 02/18/16 1152  TROPONINI 0.06*   RADIOLOGY:  US Venous Img Upper Uni Left  Result Date: 02/17/2016 CLINICAL DATA:  Left upper extremity swelling for the past 2 days. History diabetes. Evaluate for DVT. EXAM: LEFT UPPER EXTREMITY VENOUS DOPPLER ULTRASOUND TECHNIQUE: Gray-scale sonography with graded compression, as well as color Doppler and duplex ultrasound were performed to evaluate the upper extremity deep venous system from the level of the subclavian vein and including the jugular, axillary, basilic, radial, ulnar and upper cephalic vein. Spectral Doppler was utilized to evaluate flow at rest and with distal augmentation maneuvers. COMPARISON:  Right lower extremity venous Doppler ultrasound - 01/24/2015 FINDINGS: Contralateral Subclavian Vein: Respiratory phasicity is normal and symmetric with the symptomatic side. No evidence of thrombus. Normal compressibility. Internal Jugular Vein: No evidence of thrombus. Normal compressibility, respiratory phasicity and response to augmentation. Subclavian Vein: No evidence of thrombus. Normal compressibility, respiratory phasicity and response to augmentation. Axillary Vein: No evidence of thrombus.  Normal compressibility, respiratory phasicity and response to augmentation. Cephalic Vein: No evidence of thrombus. Normal  compressibility, respiratory phasicity and response to augmentation. Basilic Vein: No evidence of thrombus. Normal compressibility, respiratory phasicity and response to augmentation. Brachial Veins: No evidence of thrombus. Normal compressibility, respiratory phasicity and response to augmentation. Radial Veins: No evidence of thrombus. Normal compressibility, respiratory phasicity and response to augmentation. Ulnar Veins: No evidence of thrombus. Normal compressibility, respiratory phasicity and response to augmentation. Venous Reflux:  None visualized. Other Findings:  None visualized. IMPRESSION: No evidence of DVT within the left upper extremity. Electronically Signed   By: Sandi Mariscal M.D.   On: 02/17/2016 10:33   Dg Chest Port 1 View  Result Date: 02/18/2016 CLINICAL DATA:  Acute onset of shortness of breath and diaphoresis. Initial encounter. EXAM: PORTABLE CHEST 1 VIEW COMPARISON:  Chest radiograph performed 02/15/2016 FINDINGS: Small bilateral pleural effusions are seen. Bibasilar airspace opacities raise concern for pulmonary edema. No pneumothorax is seen. The cardiomediastinal silhouette is enlarged. No acute osseous abnormalities are identified. External pacing pads are noted. IMPRESSION: Small bilateral pleural effusions. Bibasilar airspace opacities raise concern for pulmonary edema. Cardiomegaly. Electronically Signed   By: Garald Balding M.D.   On: 02/18/2016 00:40   ASSESSMENT AND PLAN:  Joan Erickson  is a 83 y.o. female with a known history of A. fib, hypertension, hyperlipidemia, CKD stage 4 and asthma. The patient was treated for pneumonia with antibiotics a month ago by her primary care doctor. She has been feeling increasingly weak and shortness of breath on exertion for the past few days. Started to have a palpitation today and feels cold. Her PCP told her that her heart rate was fast and sent patient to ED. She was found A. fib with RVR at 140   * Acute on chrnicA. fib with  RVR. -Heart rate is better with amiodarone drip Patient did not respond much to Cardizem drip which is discontinued  -cardiology input appreciated -Currently Coumadin on hold for permacath placement  - ultrasound with no evidence of DVT and upper extremity  -Pauseswere noticed while patient was on amiodarone discontinued Continue Cardizem CD 360 mg by mouth   *SVT Rapid Response called01/29/2018 resolved after giving adenosine  * Acute on chrnicA. fib with RVR. -Heart rate is fluctuating from 110-125  patient is started on Cardizem drip  echocardiogram wnl -cardiology input appreciated -Currently Coumadin on hold for permacath placement  - ultrasound with no evidence of DVT and upper extremity  -  * Pneumonia - Ruled out - normal procalcitonin - stopped Abx - ID c/s as blood c/s growing GPR - likely false + Dr. Ola Spurr has recommended repeat blood cultures and if they are negative for the next 48 hours okay to the permacath placement. Repeat blood cultures are done today 02/18/2016 will follow  -  *Elevated troponin, due to  A. fib and renal failure, neg serial troponins and cardiology following, hold Coumadin.  DVT prophylaxis with heparin subcutaneous. Discussed with cardiology not recommending therapeutic Anticoagulation at this time  *Hyponatremia: Na improved from 125-131  nephro following.  Patient had hemodialysis 1/26, 1/27, 02/18/2016   *Diabetes: sliding scale. Hold Tradjenta  *Insomnia Benadryl as needed  Case discussed with Care Management/Social Worker. Management plans discussed with the patient,  daughter and they are in agreement.   CODE STATUS: full  DVT Prophylaxis: SCDs  TOTAL CRITICAL CARE TIME TAKING CARE OF THIS PATIENT: 41 minutes.  >50% time spent on counselling and coordination of care  POSSIBLE D/C IN 3-4 DAYS, DEPENDING ON CLINICAL CONDITION.  Note: This dictation was prepared with Dragon dictation along with smaller phrase  technology. Any transcriptional errors that result from this process are unintentional.  Nicholes Mango M.D on 02/18/2016 at 5:52 PM  Between 7am to 6pm - Pager - [phone]  After 6pm go to [email or web address] - password [email or web address] [facility]  Office  [phone]  CC: Primary care physician; Einar Pheasant, MD

## 2016-02-18 NOTE — Progress Notes (Signed)
Pharmacy Antibiotic Note  Joan Erickson is a 83 y.o. female admitted on 02/03/2016 with pneumonia.  Pharmacy has been consulted for Zosyn and vancomycin dosing.  Plan: 1. Zosyn 3.375 gm IV Q12H EI 2. Vancomycin 1.25 gm IV x 1. Patient started HD during this admission. Will give one time dose tonight and f/u AM for HD schedule.  Height: 5' (152.4 cm) Weight: 139 lb 1.8 oz (63.1 kg) IBW/kg (Calculated) : 45.5  Temp (24hrs), Avg:98.2 F (36.8 C), Min:97.7 F (36.5 C), Max:98.9 F (37.2 C)   Recent Labs Lab 02/07/2016 1232  02/12/16 0159 01/26/2016 1532 01/27/2016 0538 02/15/16 1023 02/16/16 0516 02/18/16 0102  WBC  --   --  5.6 6.9 9.5 10.3 9.0 9.2  CREATININE  --   < > 4.94* 5.45* 6.10*  --  4.39* 3.78*  LATICACIDVEN 0.8  --   --   --   --   --   --   --   < > = values in this interval not displayed.  Estimated Creatinine Clearance: 9.5 mL/min (by C-G formula based on SCr of 3.78 mg/dL (H)).    No Known Allergies  Thank you for allowing pharmacy to be a part of this patient's care.  Laural Benes, Pharm.D., BCPS Clinical Pharmacist 02/18/2016 2:23 AM

## 2016-02-18 NOTE — Progress Notes (Addendum)
Have now attempted to call dialysis department a total of 4 times for various reasons over the past half hour. Also need to clarify no heparin order. No answer. Wenda Low Lecretia Buczek   Attempted to reach 2 additional times. No answer. Wenda Low [facility]

## 2016-02-18 NOTE — Progress Notes (Signed)
Chaplain was making rounds and visited with pt in room IC-1. Pt was unconscious. Chaplain provided the ministry of silent prayer and a spiritual presence.    02/18/16 0930  Clinical Encounter Type  Visited With Patient  Visit Type Initial;Spiritual support  Referral From Nurse  Spiritual Encounters  Spiritual Needs Prayer

## 2016-02-18 NOTE — Care Management (Signed)
Barrier- Transfer to icu stepdown for rapid atrial fib. Placed on cardizem drip and amiodarone. Chancy Hurter with [employer]

## 2016-02-18 NOTE — Progress Notes (Signed)
Called to evaluate patient who became acutely diaphoretic and with more labored breathing.  EKG showed no significant change, pt has been tachycardic with difficult to control SVT for past 24 hours.  New ESRD and dialysis started this hospital stay.  There was some report of patient being treated for pneumonia prior to coming to hospital about 1 week ago now.  She has not been on abx here, and initially did not have lab or imaging evidence suggestive of pna.  However, she has significant bibasilar opacitied on CXR tonight that seem likely more like pulmonary congestion/edema, but her differential also has bandemia.  Pulmonary congestion could perhaps obscure a PNA, and given the difficulty we have had controlling her heart rate despite multiple rate controlling meds, there is likely some other underlying stressor.  Will send blood/urine cultures, and start abx.  Jacqulyn Bath [facility] 02/18/2016, 1:58 AM

## 2016-02-18 NOTE — Progress Notes (Signed)
Already on consult  Will plan for tunneled cath Wednesday or Thursday

## 2016-02-18 NOTE — Progress Notes (Signed)
Subjective: Patient went into atrial fibrillation with rapid ventricular response yesterday. Transition to the CCU. Placed on an amiodarone drip. Due for hemodialysis today.     Weight trends: Filed Weights   02/15/16 1449 02/16/16 1427 02/16/16 1728  Weight: 64 kg (141 lb 1.5 oz) 64.7 kg (142 lb 10.2 oz) 63.1 kg (139 lb 1.8 oz)    Physical Exam: General: No acute distress  HEENT Anicteric, moist oral mucous membranes  Neck: supple, no masses  Lungs: Mild basilar rales  Heart:: irregular rate and rhythm  Abdomen: Soft, nontender, nondistended  Extremities: One plus lower extremity edema  Neurologic: Awake, alert, following commands  Skin: Warm, dry, normal turgor  Access: Right femoral temp cath          Lab results: Basic Metabolic Panel:  Recent Labs Lab 01/26/2016 2010  02/16/16 0516 02/18/16 0102 02/18/16 0612  NA 132*  < > 131* 128* 129*  K 4.8  < > 4.1 4.4 4.3  CL 105  < > 96* 95* 97*  CO2 20*  < > 25 29 24   GLUCOSE 126*  < > 121* 219* 180*  BUN 78*  < > 35* 31* 34*  CREATININE 5.10*  < > 4.39* 3.78* 3.86*  CALCIUM 7.9*  < > 7.5* 8.0* 7.9*  MG 2.6*  --   --   --  2.1  < > = values in this interval not displayed.  Liver Function Tests:  Recent Labs Lab 02/14/2016 1117  AST 36  ALT 29  ALKPHOS 61  BILITOT 0.7  PROT 7.2  ALBUMIN 2.7*   No results for input(s): LIPASE, AMYLASE in the last 168 hours. No results for input(s): AMMONIA in the last 168 hours.  CBC:  Recent Labs Lab 02/14/2016 1117  02/18/16 0102 02/18/16 0612  WBC 6.5  < > 9.2 6.7  NEUTROABS 5.3  --  8.3*  --   HGB 9.7*  < > 8.5* 8.1*  HCT 28.2*  < > 25.1* 24.5*  MCV 82.5  < > 83.9 84.6  PLT 313  < > 368 308  < > = values in this interval not displayed.  Cardiac Enzymes:  Recent Labs Lab 02/18/16 0612  TROPONINI 0.05*    BNP: Invalid input(s): POCBNP  CBG:  Recent Labs Lab 02/17/16 1154 02/17/16 1640 02/17/16 2110 02/17/16 2354 02/18/16 0723  GLUCAP 130*  176* 121* 170* 153*    Microbiology: Recent Results (from the past 720 hour(s))  Blood culture (routine x 2)     Status: Abnormal   Collection Time: 02/12/2016 12:31 PM  Result Value Ref Range Status   Specimen Description BLOOD RIGHT AC  Final   Special Requests   Final    BOTTLES DRAWN AEROBIC AND ANAEROBIC AER 11ML ANA 8ML   Culture  Setup Time   Final    GRAM POSITIVE COCCI AEROBIC BOTTLE ONLY CRITICAL RESULT CALLED TO, READ BACK BY AND VERIFIED WITH:  [place] AT [id number] 01/24/2016 SDR    Culture (A)  Final    FACKLAMIA HOMINIS Standardized susceptibility testing for this organism is not available. Performed at [facility], [place] [address], [place], [place] [address]    Report Status 02/17/2016 FINAL  Final  Blood Culture ID Panel (Reflexed)     Status: None   Collection Time: 02/12/2016 12:31 PM  Result Value Ref Range Status   Enterococcus species NOT DETECTED NOT DETECTED Final   Listeria monocytogenes NOT DETECTED NOT DETECTED Final   Staphylococcus species  NOT DETECTED NOT DETECTED Final   Staphylococcus aureus NOT DETECTED NOT DETECTED Final   Streptococcus species NOT DETECTED NOT DETECTED Final   Streptococcus agalactiae NOT DETECTED NOT DETECTED Final   Streptococcus pneumoniae NOT DETECTED NOT DETECTED Final   Streptococcus pyogenes NOT DETECTED NOT DETECTED Final   Acinetobacter baumannii NOT DETECTED NOT DETECTED Final   Enterobacteriaceae species NOT DETECTED NOT DETECTED Final   Enterobacter cloacae complex NOT DETECTED NOT DETECTED Final   Escherichia coli NOT DETECTED NOT DETECTED Final   Klebsiella oxytoca NOT DETECTED NOT DETECTED Final   Klebsiella pneumoniae NOT DETECTED NOT DETECTED Final   Proteus species NOT DETECTED NOT DETECTED Final   Serratia marcescens NOT DETECTED NOT DETECTED Final   Haemophilus influenzae NOT DETECTED NOT DETECTED Final   Neisseria meningitidis NOT DETECTED NOT DETECTED Final   Pseudomonas aeruginosa NOT DETECTED NOT  DETECTED Final   Candida albicans NOT DETECTED NOT DETECTED Final   Candida glabrata NOT DETECTED NOT DETECTED Final   Candida krusei NOT DETECTED NOT DETECTED Final   Candida parapsilosis NOT DETECTED NOT DETECTED Final   Candida tropicalis NOT DETECTED NOT DETECTED Final  Blood culture (routine x 2)     Status: None   Collection Time: 02/07/2016 12:32 PM  Result Value Ref Range Status   Specimen Description BLOOD LEFT HAND  Final   Special Requests   Final    BOTTLES DRAWN AEROBIC AND ANAEROBIC AER 13ML ANA 13ML   Culture NO GROWTH 5 DAYS  Final   Report Status 02/16/2016 FINAL  Final  MRSA PCR Screening     Status: None   Collection Time: 01/26/2016 10:32 PM  Result Value Ref Range Status   MRSA by PCR NEGATIVE NEGATIVE Final    Comment:        The GeneXpert MRSA Assay (FDA approved for NASAL specimens only), is one component of a comprehensive MRSA colonization surveillance program. It is not intended to diagnose MRSA infection nor to guide or monitor treatment for MRSA infections.   Culture, expectorated sputum-assessment     Status: None   Collection Time: 02/09/2016  3:24 PM  Result Value Ref Range Status   Specimen Description SPUTUM  Final   Special Requests Normal  Final   Sputum evaluation   Final    Sputum specimen not acceptable for testing.  Please recollect.   RESULT CALLED TO, READ BACK BY AND VERIFIED WITH: Champ Mungo 02/04/2016 1608 KLW    Report Status 02/12/2016 FINAL  Final  Culture, blood (single) w Reflex to ID Panel     Status: None (Preliminary result)   Collection Time: 02/17/16  2:45 PM  Result Value Ref Range Status   Specimen Description BLOOD RIGHT ANTECUBITAL  Final   Special Requests   Final    BOTTLES DRAWN AEROBIC AND ANAEROBIC ANA 8CC AER [place]   Culture NO GROWTH < 24 HOURS  Final   Report Status PENDING  Incomplete  Culture, blood (Routine X 2) w Reflex to ID Panel     Status: None (Preliminary result)   Collection Time: 02/18/16  6:12  AM  Result Value Ref Range Status   Specimen Description BLOOD R HAND  Final   Special Requests BOTTLES DRAWN AEROBIC AND ANAEROBIC 1ML  Final   Culture NO GROWTH <12 HOURS  Final   Report Status PENDING  Incomplete     Coagulation Studies:  Recent Labs  02/16/16 0516 02/17/16 0415 02/18/16 0612  LABPROT 27.7* 23.2* 20.1*  INR 2.53 2.02  1.69    Urinalysis: No results for input(s): COLORURINE, LABSPEC, PHURINE, GLUCOSEU, HGBUR, BILIRUBINUR, KETONESUR, PROTEINUR, UROBILINOGEN, NITRITE, LEUKOCYTESUR in the last 72 hours.  Invalid input(s): APPERANCEUR      Imaging: US Venous Img Upper Uni Left  Result Date: 02/17/2016 CLINICAL DATA:  Left upper extremity swelling for the past 2 days. History diabetes. Evaluate for DVT. EXAM: LEFT UPPER EXTREMITY VENOUS DOPPLER ULTRASOUND TECHNIQUE: Gray-scale sonography with graded compression, as well as color Doppler and duplex ultrasound were performed to evaluate the upper extremity deep venous system from the level of the subclavian vein and including the jugular, axillary, basilic, radial, ulnar and upper cephalic vein. Spectral Doppler was utilized to evaluate flow at rest and with distal augmentation maneuvers. COMPARISON:  Right lower extremity venous Doppler ultrasound - 01/24/2015 FINDINGS: Contralateral Subclavian Vein: Respiratory phasicity is normal and symmetric with the symptomatic side. No evidence of thrombus. Normal compressibility. Internal Jugular Vein: No evidence of thrombus. Normal compressibility, respiratory phasicity and response to augmentation. Subclavian Vein: No evidence of thrombus. Normal compressibility, respiratory phasicity and response to augmentation. Axillary Vein: No evidence of thrombus. Normal compressibility, respiratory phasicity and response to augmentation. Cephalic Vein: No evidence of thrombus. Normal compressibility, respiratory phasicity and response to augmentation. Basilic Vein: No evidence of thrombus.  Normal compressibility, respiratory phasicity and response to augmentation. Brachial Veins: No evidence of thrombus. Normal compressibility, respiratory phasicity and response to augmentation. Radial Veins: No evidence of thrombus. Normal compressibility, respiratory phasicity and response to augmentation. Ulnar Veins: No evidence of thrombus. Normal compressibility, respiratory phasicity and response to augmentation. Venous Reflux:  None visualized. Other Findings:  None visualized. IMPRESSION: No evidence of DVT within the left upper extremity. Electronically Signed   By: Sandi Mariscal M.D.   On: 02/17/2016 10:33   Dg Chest Port 1 View  Result Date: 02/18/2016 CLINICAL DATA:  Acute onset of shortness of breath and diaphoresis. Initial encounter. EXAM: PORTABLE CHEST 1 VIEW COMPARISON:  Chest radiograph performed 02/15/2016 FINDINGS: Small bilateral pleural effusions are seen. Bibasilar airspace opacities raise concern for pulmonary edema. No pneumothorax is seen. The cardiomediastinal silhouette is enlarged. No acute osseous abnormalities are identified. External pacing pads are noted. IMPRESSION: Small bilateral pleural effusions. Bibasilar airspace opacities raise concern for pulmonary edema. Cardiomegaly. Electronically Signed   By: Garald Balding M.D.   On: 02/18/2016 00:40     Assessment & Plan: Pt is a 83 y.o. 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 ESRD/ [facility] Started with HD  -  We will plan for hemodialysis later today. We will also consult vascular surgery for PermCath placement.  2. Shortness of breath, pulm edema We will plan for mild ultrafiltration with dialysis today.   3.  Lower extremity edema Low-salt diet UF with HD  4. Hyperkalemia and hyponatremia Sodium  slightly low at 129 today. This hopefully should correct with hemodialysis.Marland Kitchen  5. Bleeding around catheter site -  We have requested vascular surgery consultation for PermCath placement. Thereafter we can remove temp read Allises catheter.

## 2016-02-18 NOTE — Progress Notes (Signed)
[facility] INFECTIOUS DISEASE PROGRESS NOTE Date of Admission:  02/02/2016     ID: Joan Erickson is a 83 y.o. female with bacteremia, ARF, SOB  Active Problems:   ARF (acute renal failure) (HCC)   Subjective: Had to go to stepdown with AFib with RVR, but no fever or wbc. CXR had bb opacities concerning for edema. Started on vanco and zosyn.   ROS  Eleven systems are reviewed and negative except per hpi  Medications:  Antibiotics Given (last 72 hours)    Date/Time Action Medication Dose Rate   02/18/16 0247 Given   vancomycin (VANCOCIN) 1,250 mg in sodium chloride 0.9 % 250 mL IVPB 1,250 mg 166.7 mL/hr   02/18/16 0301 Given   piperacillin-tazobactam (ZOSYN) IVPB 3.375 g 3.375 g 12.5 mL/hr   02/18/16 1007 Given   piperacillin-tazobactam (ZOSYN) IVPB 3.375 g 3.375 g 12.5 mL/hr     . citalopram  10 mg Oral Daily  . diltiazem  360 mg Oral Daily  . feeding supplement (ENSURE ENLIVE)  237 mL Oral BID BM  . finasteride  5 mg Oral Daily  . fluticasone  2 spray Each Nare Daily  . insulin aspart  0-5 Units Subcutaneous QHS  . insulin aspart  0-9 Units Subcutaneous TID WC  . metoprolol tartrate  50 mg Oral Q6H  . mometasone-formoterol  2 puff Inhalation BID  . montelukast  10 mg Oral Daily  . piperacillin-tazobactam (ZOSYN)  IV  3.375 g Intravenous Q12H  . pravastatin  20 mg Oral q1800  . sodium chloride flush  3 mL Intravenous Q12H    Objective: Vital signs in last 24 hours: Temp:  [97.6 F (36.4 C)-98.4 F (36.9 C)] 97.7 F (36.5 C) (01/30 0800) Pulse Rate:  [59-174] 70 (01/30 1100) Resp:  [11-29] 12 (01/30 1100) BP: (86-197)/(49-127) 108/57 (01/30 1100) SpO2:  [93 %-100 %] 100 % (01/30 1100) Constitutional:  oriented to person, place, and time. appears well-developed and well-nourished. No distress.  HENT: [place]/AT, PERRLA, no scleral icterus Mouth/Throat: Oropharynx is clear and moist. No oropharyngeal exudate.  Cardiovascular: Normal rate, regular rhythm and normal  heart sounds.  Pulmonary/Chest: poor air movement  Neck = supple, no nuchal rigidity Abdominal: Soft. Bowel sounds are normal.  exhibits no distension. There is no tenderness.  Lymphadenopathy: no cervical adenopathy. No axillary adenopathy Neurological: alert and oriented to person, place, and time.  Skin: Skin is warm and dry. No rash noted. No erythema.  Ext 2+ edema Psychiatric: a normal mood and affect.  behavior is normal.   Lab Results  Recent Labs  02/18/16 0102 02/18/16 0612  WBC 9.2 6.7  HGB 8.5* 8.1*  HCT 25.1* 24.5*  NA 128* 129*  K 4.4 4.3  CL 95* 97*  CO2 29 24  BUN 31* 34*  CREATININE 3.78* 3.86*    Microbiology: Results for orders placed or performed during the hospital encounter of 02/10/2016  Blood culture (routine x 2)     Status: Abnormal   Collection Time: 01/20/2016 12:31 PM  Result Value Ref Range Status   Specimen Description BLOOD RIGHT AC  Final   Special Requests   Final    BOTTLES DRAWN AEROBIC AND ANAEROBIC AER 11ML ANA 8ML   Culture  Setup Time   Final    GRAM POSITIVE COCCI AEROBIC BOTTLE ONLY CRITICAL RESULT CALLED TO, READ BACK BY AND VERIFIED WITH:  KAREN HAYES AT [id number] 02/04/2016 [place]    Culture (A)  Final    FACKLAMIA HOMINIS Standardized  susceptibility testing for this organism is not available. Performed at [facility], [place] [address] [place], [place] [address]    Report Status 02/17/2016 FINAL  Final  Blood Culture ID Panel (Reflexed)     Status: None   Collection Time: 02/10/2016 12:31 PM  Result Value Ref Range Status   Enterococcus species NOT DETECTED NOT DETECTED Final   Listeria monocytogenes NOT DETECTED NOT DETECTED Final   Staphylococcus species NOT DETECTED NOT DETECTED Final   Staphylococcus aureus NOT DETECTED NOT DETECTED Final   Streptococcus species NOT DETECTED NOT DETECTED Final   Streptococcus agalactiae NOT DETECTED NOT DETECTED Final   Streptococcus pneumoniae NOT DETECTED NOT DETECTED Final    Streptococcus pyogenes NOT DETECTED NOT DETECTED Final   Acinetobacter baumannii NOT DETECTED NOT DETECTED Final   Enterobacteriaceae species NOT DETECTED NOT DETECTED Final   Enterobacter cloacae complex NOT DETECTED NOT DETECTED Final   Escherichia coli NOT DETECTED NOT DETECTED Final   Klebsiella oxytoca NOT DETECTED NOT DETECTED Final   Klebsiella pneumoniae NOT DETECTED NOT DETECTED Final   Proteus species NOT DETECTED NOT DETECTED Final   Serratia marcescens NOT DETECTED NOT DETECTED Final   Haemophilus influenzae NOT DETECTED NOT DETECTED Final   Neisseria meningitidis NOT DETECTED NOT DETECTED Final   Pseudomonas aeruginosa NOT DETECTED NOT DETECTED Final   Candida albicans NOT DETECTED NOT DETECTED Final   Candida glabrata NOT DETECTED NOT DETECTED Final   Candida krusei NOT DETECTED NOT DETECTED Final   Candida parapsilosis NOT DETECTED NOT DETECTED Final   Candida tropicalis NOT DETECTED NOT DETECTED Final  Blood culture (routine x 2)     Status: None   Collection Time: 02/06/2016 12:32 PM  Result Value Ref Range Status   Specimen Description BLOOD LEFT HAND  Final   Special Requests   Final    BOTTLES DRAWN AEROBIC AND ANAEROBIC AER 13ML ANA 13ML   Culture NO GROWTH 5 DAYS  Final   Report Status 02/16/2016 FINAL  Final  MRSA PCR Screening     Status: None   Collection Time: 02/17/2016 10:32 PM  Result Value Ref Range Status   MRSA by PCR NEGATIVE NEGATIVE Final    Comment:        The GeneXpert MRSA Assay (FDA approved for NASAL specimens only), is one component of a comprehensive MRSA colonization surveillance program. It is not intended to diagnose MRSA infection nor to guide or monitor treatment for MRSA infections.   Culture, expectorated sputum-assessment     Status: None   Collection Time: 02/12/2016  3:24 PM  Result Value Ref Range Status   Specimen Description SPUTUM  Final   Special Requests Normal  Final   Sputum evaluation   Final    Sputum specimen not  acceptable for testing.  Please recollect.   RESULT CALLED TO, READ BACK BY AND VERIFIED WITH: Champ Mungo 02/03/2016 1608 KLW    Report Status 01/27/2016 FINAL  Final  Culture, blood (single) w Reflex to ID Panel     Status: None (Preliminary result)   Collection Time: 02/17/16  2:45 PM  Result Value Ref Range Status   Specimen Description BLOOD RIGHT ANTECUBITAL  Final   Special Requests   Final    BOTTLES DRAWN AEROBIC AND ANAEROBIC ANA 8CC AER [place]   Culture NO GROWTH < 24 HOURS  Final   Report Status PENDING  Incomplete  Culture, blood (Routine X 2) w Reflex to ID Panel     Status: None (Preliminary  result)   Collection Time: 02/18/16  6:12 AM  Result Value Ref Range Status   Specimen Description BLOOD R HAND  Final   Special Requests BOTTLES DRAWN AEROBIC AND ANAEROBIC 1ML  Final   Culture NO GROWTH <12 HOURS  Final   Report Status PENDING  Incomplete     Studies/Results: US Venous Img Upper Uni Left  Result Date: 02/17/2016 CLINICAL DATA:  Left upper extremity swelling for the past 2 days. History diabetes. Evaluate for DVT. EXAM: LEFT UPPER EXTREMITY VENOUS DOPPLER ULTRASOUND TECHNIQUE: Gray-scale sonography with graded compression, as well as color Doppler and duplex ultrasound were performed to evaluate the upper extremity deep venous system from the level of the subclavian vein and including the jugular, axillary, basilic, radial, ulnar and upper cephalic vein. Spectral Doppler was utilized to evaluate flow at rest and with distal augmentation maneuvers. COMPARISON:  Right lower extremity venous Doppler ultrasound - 01/24/2015 FINDINGS: Contralateral Subclavian Vein: Respiratory phasicity is normal and symmetric with the symptomatic side. No evidence of thrombus. Normal compressibility. Internal Jugular Vein: No evidence of thrombus. Normal compressibility, respiratory phasicity and response to augmentation. Subclavian Vein: No evidence of thrombus. Normal compressibility,  respiratory phasicity and response to augmentation. Axillary Vein: No evidence of thrombus. Normal compressibility, respiratory phasicity and response to augmentation. Cephalic Vein: No evidence of thrombus. Normal compressibility, respiratory phasicity and response to augmentation. Basilic Vein: No evidence of thrombus. Normal compressibility, respiratory phasicity and response to augmentation. Brachial Veins: No evidence of thrombus. Normal compressibility, respiratory phasicity and response to augmentation. Radial Veins: No evidence of thrombus. Normal compressibility, respiratory phasicity and response to augmentation. Ulnar Veins: No evidence of thrombus. Normal compressibility, respiratory phasicity and response to augmentation. Venous Reflux:  None visualized. Other Findings:  None visualized. IMPRESSION: No evidence of DVT within the left upper extremity. Electronically Signed   By: Sandi Mariscal M.D.   On: 02/17/2016 10:33   Dg Chest Port 1 View  Result Date: 02/18/2016 CLINICAL DATA:  Acute onset of shortness of breath and diaphoresis. Initial encounter. EXAM: PORTABLE CHEST 1 VIEW COMPARISON:  Chest radiograph performed 02/15/2016 FINDINGS: Small bilateral pleural effusions are seen. Bibasilar airspace opacities raise concern for pulmonary edema. No pneumothorax is seen. The cardiomediastinal silhouette is enlarged. No acute osseous abnormalities are identified. External pacing pads are noted. IMPRESSION: Small bilateral pleural effusions. Bibasilar airspace opacities raise concern for pulmonary edema. Cardiomegaly. Electronically Signed   By: Garald Balding M.D.   On: 02/18/2016 00:40   Echo 02/12/16 Study Conclusions  - Left ventricle: The cavity size was normal. There was severe   concentric hypertrophy. Systolic function was normal. The   estimated ejection fraction was in the range of 60% to 65%. - Aortic valve: Valve area (Vmax): 2.12 cm^2. - Mitral valve: There was moderate  regurgitation. - Left atrium: The atrium was mildly dilated. - Right atrium: The atrium was mildly dilated. - Tricuspid valve: There was moderate regurgitation. Assessment/Plan: Joan Erickson is a 83 y.o. female admitted with Afib RVR, Acute renal failure and PNA. No fevers, wbc nml. [place] 1/2 GPC Facklamia hominis.  Started on Ctx, azitho and vanco x one day but off since.  Given ARF had temp cath placed and getting HD.  I suspcet most of her sxs are from volume overload. BCx likely contaminant.  Her echo show no vegetation.  I do not think she has active infection but was started yest on vanco and zosyn after afib and resp decompensation.  BNP was 2000.  Recommendations DC abx Repeat bcx have been negative If cx neg in 48 hours would be ok to place a permacath.  If develops fevers chills, check sputum cx and consider CT chest Thank you very much for the consult. Will follow with you.  Mikko Lewellen P   02/18/2016, 3:08 PM

## 2016-02-18 NOTE — Progress Notes (Signed)
Report called to Richardson Landry, RN. Pt A&O x 4, A-Fib rate in the 80's, Amiodarone infusing on pump at 16.7 mL/hr (30 mg/hr). Pt transported to room 250 on cardiac monitor and O2 @ 4 lpm via [place] w/ assistance of [place], NT. Pt connected to 2-A telemetry box by this Probation officer. Nursing secretary Carmel [place]) notified of pt's arrival to unit.

## 2016-02-19 ENCOUNTER — Encounter: Payer: Self-pay | Admitting: Vascular Surgery

## 2016-02-19 LAB — GLUCOSE, CAPILLARY
GLUCOSE-CAPILLARY: 113 mg/dL — AB (ref 65–99)
GLUCOSE-CAPILLARY: 238 mg/dL — AB (ref 65–99)
GLUCOSE-CAPILLARY: 98 mg/dL (ref 65–99)

## 2016-02-19 LAB — PROTIME-INR
INR: 1.66
PROTHROMBIN TIME: 19.8 s — AB (ref 11.4–15.2)

## 2016-02-19 LAB — PHOSPHORUS: PHOSPHORUS: 4.7 mg/dL — AB (ref 2.5–4.6)

## 2016-02-19 MED ORDER — SODIUM CHLORIDE 0.9 % IV SOLN
INTRAVENOUS | Status: DC
  Administered 2016-02-19 – 2016-02-20 (×2): via INTRAVENOUS

## 2016-02-19 NOTE — Progress Notes (Signed)
Subjective: Patient seen and evaluated during hemodialysis. She appears to be tolerating this well.    HEMODIALYSIS FLOWSHEET:  Blood Flow Rate (mL/min): 400 mL/min Arterial Pressure (mmHg): -150 mmHg Venous Pressure (mmHg): 140 mmHg Transmembrane Pressure (mmHg): 30 mmHg Ultrafiltration Rate (mL/min): 490 mL/min Dialysate Flow Rate (mL/min): 600 ml/min Conductivity: Machine : 13.9 Conductivity: Machine : 13.9 Dialysis Fluid Bolus: Normal Saline Bolus Amount (mL): 250 mL Dialysate Change:  (3k 2.5ca) Intra-Hemodialysis Comments: 557ml...Dr.Tawnya Pujol bedside rounding with patient.   Weight trends: Filed Weights   02/16/16 1427 02/16/16 1728 02/19/16 0955  Weight: 64.7 kg (142 lb 10.2 oz) 63.1 kg (139 lb 1.8 oz) 63.6 kg (140 lb 3.4 oz)    Physical Exam: General: No acute distress  HEENT Anicteric, moist oral mucous membranes  Neck: supple  Lungs: Mild basilar rales, normal effort  Heart:: irregular no rubs  Abdomen: Soft, nontender, nondistended  Extremities: One plus lower extremity edema  Neurologic: Awake, alert, following commands  Skin: Warm, dry, normal turgor  Access: Right femoral temp cath          Lab results: Basic Metabolic Panel:  Recent Labs Lab 02/16/16 0516 02/18/16 0102 02/18/16 0612 02/18/16 1152 02/19/16 0513  NA 131* 128* 129*  --   --   K 4.1 4.4 4.3  --   --   CL 96* 95* 97*  --   --   CO2 25 29 24   --   --   GLUCOSE 121* 219* 180*  --   --   BUN 35* 31* 34*  --   --   CREATININE 4.39* 3.78* 3.86*  --   --   CALCIUM 7.5* 8.0* 7.9*  --   --   MG  --   --  2.1  --   --   PHOS  --   --   --  5.9* 4.7*    Liver Function Tests: No results for input(s): AST, ALT, ALKPHOS, BILITOT, PROT, ALBUMIN in the last 168 hours. No results for input(s): LIPASE, AMYLASE in the last 168 hours. No results for input(s): AMMONIA in the last 168 hours.  CBC:  Recent Labs Lab 02/18/16 0102 02/18/16 0612  WBC 9.2 6.7  NEUTROABS 8.3*  --   HGB 8.5*  8.1*  HCT 25.1* 24.5*  MCV 83.9 84.6  PLT 368 308    Cardiac Enzymes:  Recent Labs Lab 02/18/16 1152  TROPONINI 0.06*    BNP: Invalid input(s): POCBNP  CBG:  Recent Labs Lab 02/18/16 0723 02/18/16 1228 02/18/16 1704 02/18/16 2140 02/19/16 0746  GLUCAP 153* 163* 98 112* 113*    Microbiology: Recent Results (from the past 720 hour(s))  Blood culture (routine x 2)     Status: Abnormal   Collection Time: 02/06/2016 12:31 PM  Result Value Ref Range Status   Specimen Description BLOOD RIGHT AC  Final   Special Requests   Final    BOTTLES DRAWN AEROBIC AND ANAEROBIC AER 11ML ANA 8ML   Culture  Setup Time   Final    GRAM POSITIVE COCCI AEROBIC BOTTLE ONLY CRITICAL RESULT CALLED TO, READ BACK BY AND VERIFIED WITH:  KAREN HAYES AT [id number] 01/21/2016 SDR    Culture (A)  Final    FACKLAMIA HOMINIS Standardized susceptibility testing for this organism is not available. Performed at [facility], [place] [address], [place], [place] [address]    Report Status 02/17/2016 FINAL  Final  Blood Culture ID Panel (Reflexed)     Status: None  Collection Time: 01/20/2016 12:31 PM  Result Value Ref Range Status   Enterococcus species NOT DETECTED NOT DETECTED Final   Listeria monocytogenes NOT DETECTED NOT DETECTED Final   Staphylococcus species NOT DETECTED NOT DETECTED Final   Staphylococcus aureus NOT DETECTED NOT DETECTED Final   Streptococcus species NOT DETECTED NOT DETECTED Final   Streptococcus agalactiae NOT DETECTED NOT DETECTED Final   Streptococcus pneumoniae NOT DETECTED NOT DETECTED Final   Streptococcus pyogenes NOT DETECTED NOT DETECTED Final   Acinetobacter baumannii NOT DETECTED NOT DETECTED Final   Enterobacteriaceae species NOT DETECTED NOT DETECTED Final   Enterobacter cloacae complex NOT DETECTED NOT DETECTED Final   Escherichia coli NOT DETECTED NOT DETECTED Final   Klebsiella oxytoca NOT DETECTED NOT DETECTED Final   Klebsiella pneumoniae NOT DETECTED NOT  DETECTED Final   Proteus species NOT DETECTED NOT DETECTED Final   Serratia marcescens NOT DETECTED NOT DETECTED Final   Haemophilus influenzae NOT DETECTED NOT DETECTED Final   Neisseria meningitidis NOT DETECTED NOT DETECTED Final   Pseudomonas aeruginosa NOT DETECTED NOT DETECTED Final   Candida albicans NOT DETECTED NOT DETECTED Final   Candida glabrata NOT DETECTED NOT DETECTED Final   Candida krusei NOT DETECTED NOT DETECTED Final   Candida parapsilosis NOT DETECTED NOT DETECTED Final   Candida tropicalis NOT DETECTED NOT DETECTED Final  Blood culture (routine x 2)     Status: None   Collection Time: 02/12/2016 12:32 PM  Result Value Ref Range Status   Specimen Description BLOOD LEFT HAND  Final   Special Requests   Final    BOTTLES DRAWN AEROBIC AND ANAEROBIC AER 13ML ANA 13ML   Culture NO GROWTH 5 DAYS  Final   Report Status 02/16/2016 FINAL  Final  MRSA PCR Screening     Status: None   Collection Time: 02/17/2016 10:32 PM  Result Value Ref Range Status   MRSA by PCR NEGATIVE NEGATIVE Final    Comment:        The GeneXpert MRSA Assay (FDA approved for NASAL specimens only), is one component of a comprehensive MRSA colonization surveillance program. It is not intended to diagnose MRSA infection nor to guide or monitor treatment for MRSA infections.   Culture, expectorated sputum-assessment     Status: None   Collection Time: 02/12/2016  3:24 PM  Result Value Ref Range Status   Specimen Description SPUTUM  Final   Special Requests Normal  Final   Sputum evaluation   Final    Sputum specimen not acceptable for testing.  Please recollect.   RESULT CALLED TO, READ BACK BY AND VERIFIED WITH: Champ Mungo 02/15/2016 1608 KLW    Report Status 01/31/2016 FINAL  Final  Culture, blood (single) w Reflex to ID Panel     Status: None (Preliminary result)   Collection Time: 02/17/16  2:45 PM  Result Value Ref Range Status   Specimen Description BLOOD RIGHT ANTECUBITAL  Final    Special Requests   Final    BOTTLES DRAWN AEROBIC AND ANAEROBIC ANA 8CC AER [place]   Culture NO GROWTH 2 DAYS  Final   Report Status PENDING  Incomplete  Culture, blood (Routine X 2) w Reflex to ID Panel     Status: None (Preliminary result)   Collection Time: 02/18/16  6:12 AM  Result Value Ref Range Status   Specimen Description BLOOD R HAND  Final   Special Requests BOTTLES DRAWN AEROBIC AND ANAEROBIC 1ML  Final   Culture NO GROWTH 1 DAY  Final  Report Status PENDING  Incomplete  Culture, blood (Routine X 2) w Reflex to ID Panel     Status: None (Preliminary result)   Collection Time: 02/18/16  6:12 AM  Result Value Ref Range Status   Specimen Description BLOOD LEFT HAND  Final   Special Requests BOTTLES DRAWN AEROBIC AND ANAEROBIC 4ML  Final   Culture NO GROWTH < 24 HOURS  Final   Report Status PENDING  Incomplete     Coagulation Studies:  Recent Labs  02/17/16 0415 02/18/16 0612 02/19/16 0513  LABPROT 23.2* 20.1* 19.8*  INR 2.02 1.69 1.66    Urinalysis: No results for input(s): COLORURINE, LABSPEC, PHURINE, GLUCOSEU, HGBUR, BILIRUBINUR, KETONESUR, PROTEINUR, UROBILINOGEN, NITRITE, LEUKOCYTESUR in the last 72 hours.  Invalid input(s): APPERANCEUR      Imaging: Dg Chest Port 1 View  Result Date: 02/18/2016 CLINICAL DATA:  Acute onset of shortness of breath and diaphoresis. Initial encounter. EXAM: PORTABLE CHEST 1 VIEW COMPARISON:  Chest radiograph performed 02/15/2016 FINDINGS: Small bilateral pleural effusions are seen. Bibasilar airspace opacities raise concern for pulmonary edema. No pneumothorax is seen. The cardiomediastinal silhouette is enlarged. No acute osseous abnormalities are identified. External pacing pads are noted. IMPRESSION: Small bilateral pleural effusions. Bibasilar airspace opacities raise concern for pulmonary edema. Cardiomegaly. Electronically Signed   By: Garald Balding M.D.   On: 02/18/2016 00:40     Assessment & Plan: Pt is a 83 y.o. 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 ESRD/ [facility] Started with HD  -  Pt seen and evaluated during HD, tolerating well, complete HD today, will need permcath tomorrow.   2. Shortness of breath, pulm edema Improved from admission, continue UF with HD.   3.  Lower extremity edema - continue UF with HD, will need EDW adjustment as outpt.   4. Hyperkalemia and hyponatremia Hyperkalemia improved with dialysis. Potassium down to 4.3. Continue to monitor.  5. Bleeding around catheter site -  Patient for PermCath placement tomorrow. Thereafter we will discontinue temporary dialysis catheter.

## 2016-02-19 NOTE — Progress Notes (Signed)
CH responded to a PG for RR. Pt was awake and being examined by RR team. [place] provided presence and silent prayer until released by RR team. [place] is available for follow up as needed.    02/19/16 1400  Clinical Encounter Type  Visited With Patient;Health care provider  Visit Type Initial;Spiritual support;Code (Rapid Response)  Referral From Nurse  Spiritual Encounters  Spiritual Needs Prayer

## 2016-02-19 NOTE — Progress Notes (Signed)
Post Hemodialysis:Patient remains asymptomatic tachycardic.Primary nurse stated to send patient back to room,there he will push medication for heartrate.Patient alert/talking with staff continues to deny any chest pains,shortness of breath or heart flutters.CCMD notified.

## 2016-02-19 NOTE — Care Management (Signed)
Anticipate perm cath 2.1.2018.  Chancy Hurter with [facility] that could anticipate discharge within next 48 hours.  Patient still has to sit for dialysis.  She has not been ambulated due to temporary dialysis cath in right femoral.

## 2016-02-19 NOTE — Progress Notes (Signed)
Hemodialysis stopped 38minutes early due to sustained heartrate in 160s.Patient denies any chest pains,shortness of breath,or heart flutters.Talking with staff appropriately.Primary nurse called,made aware.

## 2016-02-19 NOTE — Progress Notes (Addendum)
[facility] [place] NAME: Joan Erickson    MR#:  [id number]  DATE OF BIRTH:  [birth year]  SUBJECTIVE:  Patient is doing okay , transferred to ICU for atrial fibrillation with RVR overnight  Now rate  controlled   REVIEW OF SYSTEMS:   Review of Systems  Constitutional: Negative for chills, fever and weight loss.  HENT: Negative for ear discharge, ear pain and nosebleeds.   Eyes: Negative for blurred vision, pain and discharge.  Respiratory: Negative for sputum production, shortness of breath, wheezing and stridor.   Cardiovascular: Negative for chest pain, palpitations, orthopnea and PND.  Gastrointestinal: Negative for abdominal pain, diarrhea, nausea and vomiting.  Genitourinary: Negative for frequency and urgency.  Musculoskeletal: Negative for back pain and joint pain.  Neurological: Positive for weakness. Negative for sensory change, speech change and focal weakness.  Psychiatric/Behavioral: Negative for depression and hallucinations. The patient is not nervous/anxious.    Tolerating Diet:yes  DRUG ALLERGIES:  No Known Allergies  VITALS:  Blood pressure (!) 128/49, pulse 73, temperature 98.1 F (36.7 C), temperature source Oral, resp. rate 18, height 5' (1.524 m), weight 63.1 kg (139 lb 3.2 oz), SpO2 99 %.  PHYSICAL EXAMINATION:   Physical Exam  GENERAL:  83 y.o.-year-old patient lying in the bed with no acute distress.  EYES: Pupils equal, round, reactive to light and accommodation. No scleral icterus. Extraocular muscles intact.  HEENT: Head atraumatic, normocephalic. Oropharynx and nasopharynx clear.  NECK:  Supple, no jugular venous distention. No thyroid enlargement, no tenderness.  LUNGS: Normal breath sounds bilaterally, no wheezing, rales, rhonchi. No use of accessory muscles of respiration.  CARDIOVASCULAR: Irregularly irregular No murmurs, rubs, or gallops.  ABDOMEN: Soft, nontender, nondistended. Bowel sounds  present. No organomegaly or mass.  EXTREMITIES: No bleeding noticed on the right groin area at the dialysis catheter site  No cyanosis, clubbing or edema b/l.    NEUROLOGIC: Cranial nerves II through XII are intact. No focal Motor or sensory deficits b/l.   PSYCHIATRIC:  patient is alert and oriented x 3.  SKIN: No obvious rash, lesion, or ulcer.  LABORATORY PANEL:  CBC  Recent Labs Lab 02/18/16 0612  WBC 6.7  HGB 8.1*  HCT 24.5*  PLT 308    Chemistries   Recent Labs Lab 02/18/16 0612  NA 129*  K 4.3  CL 97*  CO2 24  GLUCOSE 180*  BUN 34*  CREATININE 3.86*  CALCIUM 7.9*  MG 2.1   Cardiac Enzymes  Recent Labs Lab 02/18/16 1152  TROPONINI 0.06*   RADIOLOGY:  Dg Chest Port 1 View  Result Date: 02/18/2016 CLINICAL DATA:  Acute onset of shortness of breath and diaphoresis. Initial encounter. EXAM: PORTABLE CHEST 1 VIEW COMPARISON:  Chest radiograph performed 02/15/2016 FINDINGS: Small bilateral pleural effusions are seen. Bibasilar airspace opacities raise concern for pulmonary edema. No pneumothorax is seen. The cardiomediastinal silhouette is enlarged. No acute osseous abnormalities are identified. External pacing pads are noted. IMPRESSION: Small bilateral pleural effusions. Bibasilar airspace opacities raise concern for pulmonary edema. Cardiomegaly. Electronically Signed   By: Garald Balding M.D.   On: 02/18/2016 00:40   ASSESSMENT AND PLAN:  Joan Erickson  is a 83 y.o. female with a known history of A. fib, hypertension, hyperlipidemia, CKD stage 4 and asthma. The patient was treated for pneumonia with antibiotics a month ago by her primary care doctor. She has been feeling increasingly weak and shortness of breath on exertion for the past few  days. Started to have a palpitation today and feels cold. Her PCP told her that her heart rate was fast and sent patient to ED. She was found A. fib with RVR at 140   * Acute on chrnicA. fib with RVR. -Heart rate is better with  amiodarone dripDiscontinued Patient did not respond much to Cardizem drip which is discontinued  -cardiology input appreciated -Currently Coumadin on hold for permacath placement  - ultrasound with no evidence of DVT and upper extremity  -Pauseswere noticed while patient was on amiodarone discontinued Continue Cardizem CD 360 mg by mouth   *SVT-resolved Rapid Response called01/29/2018 resolved after giving adenosine  * Acute on chrnicA. fib with RVR. -Heart rate is fluctuating from 110-125  patient is started on Cardizem drip  echocardiogram wnl -cardiology input appreciated -Currently Coumadin on hold for permacath placement  - ultrasound with no evidence of DVT and upper extremity  -  * Pneumonia - Ruled out - normal procalcitonin - stopped Abx - ID c/s as blood c/s growing GPR - likely false + Dr. Ola Spurr has recommended repeat blood cultures and if they are negative for the next 48 hours okay to the permacath placement. -  IF patient develops fever and chills check sputum culture and consider CT chest  *Elevated troponin, due to  A. fib and renal failure, neg serial troponins and cardiology following, hold Coumadin.  DVT prophylaxis with heparin subcutaneous. Discussed with cardiology not recommending therapeutic Anticoagulation at this time  *Acute renal failure /Hyponatremia: Na improved from 125-131  nephro following.  Patient had hemodialysis 1/26, 1/27, 02/18/2016  Repeat blood cultures performed on January 30 are negative for 24 hours  if they are negative for the next 48 hours okay to the permacath placement Nothing by mouth after midnight with an anticipatION for permacath placement tomorrow  *Diabetes: sliding scale. Hold Tradjenta  *Insomnia Benadryl as needed  Case discussed with Care Management/Social Worker. Management plans discussed with the patient,  daughter and they are in agreement.   CODE STATUS: full  DVT Prophylaxis: SCDs  TOTAL  TIME  TAKING CARE OF THIS PATIENT: 11minutes.  >50% time spent on counselling and coordination of care  POSSIBLE D/C IN 2 DAYS, DEPENDING ON CLINICAL CONDITION.  Note: This dictation was prepared with Dragon dictation along with smaller phrase technology. Any transcriptional errors that result from this process are unintentional.  Nicholes Mango M.D on 02/19/2016 at 5:48 PM  Between 7am to 6pm - Pager - [phone]  After 6pm go to [email or web address] - password [email or web address]  Office  [phone]  CC: Primary care physician; Einar Pheasant, MD

## 2016-02-19 NOTE — Progress Notes (Signed)
Joan Erickson INFECTIOUS DISEASE PROGRESS NOTE Date of Admission:  01/22/2016     ID: Joan Erickson is a 83 y.o. female with bacteremia, ARF, SOB  Active Problems:   ARF (acute renal failure) (HCC)   Subjective: Remains af, abx stopped yest  ROS  Eleven systems are reviewed and negative except per hpi  Medications:  Antibiotics Given (last 72 hours)    Date/Time Action Medication Dose Rate   02/18/16 0247 Given   vancomycin (VANCOCIN) 1,250 mg in sodium chloride 0.9 % 250 mL IVPB 1,250 mg 166.7 mL/hr   02/18/16 0301 Given   piperacillin-tazobactam (ZOSYN) IVPB 3.375 g 3.375 g 12.5 mL/hr   02/18/16 1007 Given   piperacillin-tazobactam (ZOSYN) IVPB 3.375 g 3.375 g 12.5 mL/hr     . citalopram  10 mg Oral Daily  . diltiazem  360 mg Oral Daily  . feeding supplement (ENSURE ENLIVE)  237 mL Oral BID BM  . finasteride  5 mg Oral Daily  . fluticasone  2 spray Each Nare Daily  . heparin subcutaneous  5,000 Units Subcutaneous Q8H  . insulin aspart  0-5 Units Subcutaneous QHS  . insulin aspart  0-9 Units Subcutaneous TID WC  . metoprolol tartrate  50 mg Oral Q6H  . mometasone-formoterol  2 puff Inhalation BID  . montelukast  10 mg Oral Daily  . pravastatin  20 mg Oral q1800  . sodium chloride flush  3 mL Intravenous Q12H    Objective: Vital signs in last 24 hours: Temp:  [98 F (36.7 C)-98.5 F (36.9 C)] 98.1 F (36.7 C) (01/31 1416) Pulse Rate:  [63-167] 73 (01/31 1416) Resp:  [16-18] 18 (01/31 1416) BP: (103-142)/(49-88) 128/49 (01/31 1416) SpO2:  [94 %-100 %] 99 % (01/31 1416) Weight:  [63.1 kg (139 lb 3.2 oz)-63.6 kg (140 lb 3.4 oz)] 63.1 kg (139 lb 3.2 oz) (01/31 1416) Constitutional:  oriented to person, place, and time. appears well-developed and well-nourished. No distress.  HENT: [place]/AT, PERRLA, no scleral icterus Mouth/Throat: Oropharynx is clear and moist. No oropharyngeal exudate.  Cardiovascular: Normal rate, regular rhythm and normal heart sounds.   Pulmonary/Chest: poor air movement  Neck = supple, no nuchal rigidity Abdominal: Soft. Bowel sounds are normal.  exhibits no distension. There is no tenderness.  Lymphadenopathy: no cervical adenopathy. No axillary adenopathy Neurological: alert and oriented to person, place, and time.  Skin: Skin is warm and dry. No rash noted. No erythema.  Ext 2+ edema Psychiatric: a normal mood and affect.  behavior is normal.   Lab Results  Recent Labs  02/18/16 0102 02/18/16 0612  WBC 9.2 6.7  HGB 8.5* 8.1*  HCT 25.1* 24.5*  NA 128* 129*  K 4.4 4.3  CL 95* 97*  CO2 29 24  BUN 31* 34*  CREATININE 3.78* 3.86*    Microbiology: Results for orders placed or performed during the hospital encounter of 02/03/2016  Blood culture (routine x 2)     Status: Abnormal   Collection Time: 02/18/2016 12:31 PM  Result Value Ref Range Status   Specimen Description BLOOD RIGHT AC  Final   Special Requests   Final    BOTTLES DRAWN AEROBIC AND ANAEROBIC AER 11ML ANA 8ML   Culture  Setup Time   Final    GRAM POSITIVE COCCI AEROBIC BOTTLE ONLY CRITICAL RESULT CALLED TO, READ BACK BY AND VERIFIED WITH:  KAREN HAYES AT [id number] 02/01/2016 SDR    Culture (A)  Final    FACKLAMIA HOMINIS Standardized susceptibility testing for  this organism is not available. Performed at [facility], [place] [address] [place], [place] [address]    Report Status 02/17/2016 FINAL  Final  Blood Culture ID Panel (Reflexed)     Status: None   Collection Time: 02/03/2016 12:31 PM  Result Value Ref Range Status   Enterococcus species NOT DETECTED NOT DETECTED Final   Listeria monocytogenes NOT DETECTED NOT DETECTED Final   Staphylococcus species NOT DETECTED NOT DETECTED Final   Staphylococcus aureus NOT DETECTED NOT DETECTED Final   Streptococcus species NOT DETECTED NOT DETECTED Final   Streptococcus agalactiae NOT DETECTED NOT DETECTED Final   Streptococcus pneumoniae NOT DETECTED NOT DETECTED Final   Streptococcus pyogenes  NOT DETECTED NOT DETECTED Final   Acinetobacter baumannii NOT DETECTED NOT DETECTED Final   Enterobacteriaceae species NOT DETECTED NOT DETECTED Final   Enterobacter cloacae complex NOT DETECTED NOT DETECTED Final   Escherichia coli NOT DETECTED NOT DETECTED Final   Klebsiella oxytoca NOT DETECTED NOT DETECTED Final   Klebsiella pneumoniae NOT DETECTED NOT DETECTED Final   Proteus species NOT DETECTED NOT DETECTED Final   Serratia marcescens NOT DETECTED NOT DETECTED Final   Haemophilus influenzae NOT DETECTED NOT DETECTED Final   Neisseria meningitidis NOT DETECTED NOT DETECTED Final   Pseudomonas aeruginosa NOT DETECTED NOT DETECTED Final   Candida albicans NOT DETECTED NOT DETECTED Final   Candida glabrata NOT DETECTED NOT DETECTED Final   Candida krusei NOT DETECTED NOT DETECTED Final   Candida parapsilosis NOT DETECTED NOT DETECTED Final   Candida tropicalis NOT DETECTED NOT DETECTED Final  Blood culture (routine x 2)     Status: None   Collection Time: 01/21/2016 12:32 PM  Result Value Ref Range Status   Specimen Description BLOOD LEFT HAND  Final   Special Requests   Final    BOTTLES DRAWN AEROBIC AND ANAEROBIC AER 13ML ANA 13ML   Culture NO GROWTH 5 DAYS  Final   Report Status 02/16/2016 FINAL  Final  MRSA PCR Screening     Status: None   Collection Time: 01/22/2016 10:32 PM  Result Value Ref Range Status   MRSA by PCR NEGATIVE NEGATIVE Final    Comment:        The GeneXpert MRSA Assay (FDA approved for NASAL specimens only), is one component of a comprehensive MRSA colonization surveillance program. It is not intended to diagnose MRSA infection nor to guide or monitor treatment for MRSA infections.   Culture, expectorated sputum-assessment     Status: None   Collection Time: 02/18/2016  3:24 PM  Result Value Ref Range Status   Specimen Description SPUTUM  Final   Special Requests Normal  Final   Sputum evaluation   Final    Sputum specimen not acceptable for testing.   Please recollect.   RESULT CALLED TO, READ BACK BY AND VERIFIED WITH: Champ Mungo 02/18/2016 1608 KLW    Report Status 02/19/2016 FINAL  Final  Culture, blood (single) w Reflex to ID Panel     Status: None (Preliminary result)   Collection Time: 02/17/16  2:45 PM  Result Value Ref Range Status   Specimen Description BLOOD RIGHT ANTECUBITAL  Final   Special Requests   Final    BOTTLES DRAWN AEROBIC AND ANAEROBIC ANA 8CC AER [place]   Culture NO GROWTH 2 DAYS  Final   Report Status PENDING  Incomplete  Culture, blood (Routine X 2) w Reflex to ID Panel     Status: None (Preliminary result)   Collection  Time: 02/18/16  6:12 AM  Result Value Ref Range Status   Specimen Description BLOOD R HAND  Final   Special Requests BOTTLES DRAWN AEROBIC AND ANAEROBIC 1ML  Final   Culture NO GROWTH 1 DAY  Final   Report Status PENDING  Incomplete  Culture, blood (Routine X 2) w Reflex to ID Panel     Status: None (Preliminary result)   Collection Time: 02/18/16  6:12 AM  Result Value Ref Range Status   Specimen Description BLOOD LEFT HAND  Final   Special Requests BOTTLES DRAWN AEROBIC AND ANAEROBIC 4ML  Final   Culture NO GROWTH < 24 HOURS  Final   Report Status PENDING  Incomplete     Studies/Results: Dg Chest Port 1 View  Result Date: 02/18/2016 CLINICAL DATA:  Acute onset of shortness of breath and diaphoresis. Initial encounter. EXAM: PORTABLE CHEST 1 VIEW COMPARISON:  Chest radiograph performed 02/15/2016 FINDINGS: Small bilateral pleural effusions are seen. Bibasilar airspace opacities raise concern for pulmonary edema. No pneumothorax is seen. The cardiomediastinal silhouette is enlarged. No acute osseous abnormalities are identified. External pacing pads are noted. IMPRESSION: Small bilateral pleural effusions. Bibasilar airspace opacities raise concern for pulmonary edema. Cardiomegaly. Electronically Signed   By: Garald Balding M.D.   On: 02/18/2016 00:40   Echo 02/12/16 Study  Conclusions  - Left ventricle: The cavity size was normal. There was severe   concentric hypertrophy. Systolic function was normal. The   estimated ejection fraction was in the range of 60% to 65%. - Aortic valve: Valve area (Vmax): 2.12 cm^2. - Mitral valve: There was moderate regurgitation. - Left atrium: The atrium was mildly dilated. - Right atrium: The atrium was mildly dilated. - Tricuspid valve: There was moderate regurgitation.  Assessment/Plan: Joan Erickson is a 83 y.o. female admitted with Afib RVR, Acute renal failure and PNA. No fevers, wbc nml. [place] 1/2 GPC Facklamia hominis.  Started on Ctx, azitho and vanco x one day but off since.  Given ARF had temp cath placed and getting HD.  I suspect most of her sxs are from volume overload. BCx likely contaminant.  Her echo show no vegetation.  I do not think she has active infection but was started 1/29 on vanco and zosyn after afib and resp decompensation.  BNP was 2000.  Stopped abx 1/30 and doing well Recommendations Repeat bcx have been negative Since fu cx neg x 48 hours would be ok to place a permacath.  If develops fevers chills, check sputum cx and consider CT chest Thank you very much for the consult. Will follow with you.  Myrtis Maille P   02/19/2016, 3:59 PM

## 2016-02-19 NOTE — Progress Notes (Signed)
Post hd tx 

## 2016-02-19 NOTE — Progress Notes (Signed)
Hemodialysis treatment started. 

## 2016-02-19 NOTE — Progress Notes (Signed)
Pre-hd tx 

## 2016-02-19 NOTE — Significant Event (Signed)
Rapid Response Event Note  Overview:  Event Type: Cardiac  Initial Focused Assessment: Pt in bed, denies pain, no obvious signs of distress. Pt vs HR 167, BP 11//84, O2 100% 3L [place]   Interventions: Pt administered 5 mg Metop x 2 per MD order  Plan of Care (if not transferred): Continue to monitor pt on tele floor  Event Summary: Name of Physician Notified: Dr. Margaretmary Eddy at 1330 Pt responded to treatment on floor. Pt converted to NSR, HR 80 - 90s, Pt denies pain. Vss. Family at bedside. RN to continue to monitor   at    Outcome: Stayed in room and stabalized  Event End Time: 1400  Tomasa Rand

## 2016-02-20 ENCOUNTER — Encounter: Admission: EM | Disposition: E | Payer: Self-pay | Source: Home / Self Care | Attending: Internal Medicine

## 2016-02-20 DIAGNOSIS — N186 End stage renal disease: Secondary | ICD-10-CM

## 2016-02-20 HISTORY — PX: PERIPHERAL VASCULAR CATHETERIZATION: SHX172C

## 2016-02-20 LAB — CBC
HCT: 22 % — ABNORMAL LOW (ref 35.0–47.0)
HEMOGLOBIN: 7.4 g/dL — AB (ref 12.0–16.0)
MCH: 27.7 pg (ref 26.0–34.0)
MCHC: 33.6 g/dL (ref 32.0–36.0)
MCV: 82.7 fL (ref 80.0–100.0)
Platelets: 267 10*3/uL (ref 150–440)
RBC: 2.66 MIL/uL — AB (ref 3.80–5.20)
RDW: 16.1 % — ABNORMAL HIGH (ref 11.5–14.5)
WBC: 5.5 10*3/uL (ref 3.6–11.0)

## 2016-02-20 LAB — BLOOD GAS, ARTERIAL
Acid-Base Excess: 4.9 mmol/L — ABNORMAL HIGH (ref 0.0–2.0)
BICARBONATE: 33.4 mmol/L — AB (ref 20.0–28.0)
FIO2: 0.4
O2 Saturation: 91.5 %
PATIENT TEMPERATURE: 37
pCO2 arterial: 78 mmHg (ref 32.0–48.0)
pH, Arterial: 7.24 — ABNORMAL LOW (ref 7.350–7.450)
pO2, Arterial: 73 mmHg — ABNORMAL LOW (ref 83.0–108.0)

## 2016-02-20 LAB — GLUCOSE, CAPILLARY
GLUCOSE-CAPILLARY: 123 mg/dL — AB (ref 65–99)
GLUCOSE-CAPILLARY: 144 mg/dL — AB (ref 65–99)
GLUCOSE-CAPILLARY: 145 mg/dL — AB (ref 65–99)
GLUCOSE-CAPILLARY: 99 mg/dL (ref 65–99)

## 2016-02-20 LAB — PROTIME-INR
INR: 1.45
PROTHROMBIN TIME: 17.8 s — AB (ref 11.4–15.2)

## 2016-02-20 SURGERY — DIALYSIS/PERMA CATHETER INSERTION
Anesthesia: Moderate Sedation

## 2016-02-20 MED ORDER — FLUMAZENIL 0.5 MG/5ML IV SOLN
INTRAVENOUS | Status: AC
  Administered 2016-02-20: 0.5 mg via INTRAVENOUS
  Filled 2016-02-20: qty 5

## 2016-02-20 MED ORDER — FLUMAZENIL 0.5 MG/5ML IV SOLN
0.5000 mg | INTRAVENOUS | Status: DC | PRN
  Administered 2016-02-20: 0.5 mg via INTRAVENOUS

## 2016-02-20 MED ORDER — HEPARIN (PORCINE) IN NACL 2-0.9 UNIT/ML-% IJ SOLN
INTRAMUSCULAR | Status: AC
  Filled 2016-02-20: qty 1000

## 2016-02-20 MED ORDER — SODIUM CHLORIDE 0.9 % IV SOLN: 100.0000 mL | INTRAVENOUS | Status: DC | PRN

## 2016-02-20 MED ORDER — LIDOCAINE-EPINEPHRINE (PF) 2 %-1:200000 IJ SOLN
INTRAMUSCULAR | Status: AC
  Filled 2016-02-20: qty 40

## 2016-02-20 MED ORDER — FLUMAZENIL 0.5 MG/5ML IV SOLN
0.2000 mg | Freq: Once | INTRAVENOUS | Status: AC
  Administered 2016-02-20: 0.2 mg via INTRAVENOUS

## 2016-02-20 MED ORDER — CEFAZOLIN IN D5W 1 GM/50ML IV SOLN
1.0000 g | Freq: Once | INTRAVENOUS | Status: AC
Start: 2016-02-20 — End: 2016-02-20
  Administered 2016-02-20: 1 g via INTRAVENOUS

## 2016-02-20 MED ORDER — MIDAZOLAM HCL 2 MG/ML PO SYRP
5.0000 mg | ORAL_SOLUTION | Freq: Once | ORAL | Status: AC
  Administered 2016-02-20: 5 mg via ORAL

## 2016-02-20 MED ORDER — FENTANYL CITRATE (PF) 100 MCG/2ML IJ SOLN
INTRAMUSCULAR | Status: AC
  Filled 2016-02-20: qty 2

## 2016-02-20 MED ORDER — FLUMAZENIL 0.5 MG/5ML IV SOLN
0.2000 mg | INTRAVENOUS | Status: DC | PRN
  Administered 2016-02-20: 0.2 mg via INTRAVENOUS
  Filled 2016-02-20: qty 5

## 2016-02-20 MED ORDER — MIDAZOLAM HCL 2 MG/ML PO SYRP
ORAL_SOLUTION | ORAL | Status: AC
  Administered 2016-02-20: 5 mg via ORAL
  Filled 2016-02-20: qty 4

## 2016-02-20 MED ORDER — HEPARIN SODIUM (PORCINE) 10000 UNIT/ML IJ SOLN
INTRAMUSCULAR | Status: AC
  Filled 2016-02-20: qty 1

## 2016-02-20 MED ORDER — FLUMAZENIL 0.5 MG/5ML IV SOLN
INTRAVENOUS | Status: AC
  Administered 2016-02-20: 0.2 mg via INTRAVENOUS
  Filled 2016-02-20: qty 10

## 2016-02-20 MED ORDER — MIDAZOLAM HCL 5 MG/5ML IJ SOLN
INTRAMUSCULAR | Status: AC
  Filled 2016-02-20: qty 5

## 2016-02-20 SURGICAL SUPPLY — 2 items
CATH PALINDROME RT-P 15FX19CM (CATHETERS) ×3 IMPLANT
PACK ANGIOGRAPHY (CUSTOM PROCEDURE TRAY) ×3 IMPLANT

## 2016-02-20 NOTE — Progress Notes (Signed)
Post hemodialysis treatment.Tolerated 2.5hours of treatment with no fluid removal per orders.Patient remains on Cardizem drip and Amiodarone drip.

## 2016-02-20 NOTE — Progress Notes (Signed)
Subjective:   Weight trends: Filed Weights   02/16/16 1728 02/19/16 0955 02/19/16 1416  Weight: 63.1 kg (139 lb 1.8 oz) 63.6 kg (140 lb 3.4 oz) 63.1 kg (139 lb 3.2 oz)    Physical Exam: General: No acute distress  HEENT Anicteric, moist oral mucous membranes  Neck: supple  Lungs: Mild basilar rales, normal effort  Heart:: irregular no rubs  Abdomen: Soft, nontender, nondistended  Extremities: 1+ plus lower extremity edema  Neurologic: Awake, alert, following commands  Skin: Warm, dry, normal turgor  Access: Right femoral temp cath          Lab results: Basic Metabolic Panel:  Recent Labs Lab 02/16/16 0516 02/18/16 0102 02/18/16 0612 02/18/16 1152 02/19/16 0513  NA 131* 128* 129*  --   --   K 4.1 4.4 4.3  --   --   CL 96* 95* 97*  --   --   CO2 25 29 24   --   --   GLUCOSE 121* 219* 180*  --   --   BUN 35* 31* 34*  --   --   CREATININE 4.39* 3.78* 3.86*  --   --   CALCIUM 7.5* 8.0* 7.9*  --   --   MG  --   --  2.1  --   --   PHOS  --   --   --  5.9* 4.7*    Liver Function Tests: No results for input(s): AST, ALT, ALKPHOS, BILITOT, PROT, ALBUMIN in the last 168 hours. No results for input(s): LIPASE, AMYLASE in the last 168 hours. No results for input(s): AMMONIA in the last 168 hours.  CBC:  Recent Labs Lab 02/18/16 0102 02/18/16 0612 02/22/2016 0512  WBC 9.2 6.7 5.5  NEUTROABS 8.3*  --   --   HGB 8.5* 8.1* 7.4*  HCT 25.1* 24.5* 22.0*  MCV 83.9 84.6 82.7  PLT 368 308 267    Cardiac Enzymes:  Recent Labs Lab 02/18/16 1152  TROPONINI 0.06*    BNP: Invalid input(s): POCBNP  CBG:  Recent Labs Lab 02/18/16 2140 02/19/16 0746 02/19/16 1635 02/19/16 2059 02/28/2016 0720  GLUCAP 112* 113* 238* 98 123*    Microbiology: Recent Results (from the past 720 hour(s))  Blood culture (routine x 2)     Status: Abnormal   Collection Time: 02/12/2016 12:31 PM  Result Value Ref Range Status   Specimen Description BLOOD RIGHT AC  Final   Special  Requests   Final    BOTTLES DRAWN AEROBIC AND ANAEROBIC AER 11ML ANA 8ML   Culture  Setup Time   Final    GRAM POSITIVE COCCI AEROBIC BOTTLE ONLY CRITICAL RESULT CALLED TO, READ BACK BY AND VERIFIED WITH:  KAREN HAYES AT [id number] 02/05/2016 SDR    Culture (A)  Final    FACKLAMIA HOMINIS Standardized susceptibility testing for this organism is not available. Performed at [facility], [place] [address], [place], [place] [address]    Report Status 02/17/2016 FINAL  Final  Blood Culture ID Panel (Reflexed)     Status: None   Collection Time: 02/01/2016 12:31 PM  Result Value Ref Range Status   Enterococcus species NOT DETECTED NOT DETECTED Final   Listeria monocytogenes NOT DETECTED NOT DETECTED Final   Staphylococcus species NOT DETECTED NOT DETECTED Final   Staphylococcus aureus NOT DETECTED NOT DETECTED Final   Streptococcus species NOT DETECTED NOT DETECTED Final   Streptococcus agalactiae NOT DETECTED NOT DETECTED Final   Streptococcus pneumoniae NOT DETECTED  NOT DETECTED Final   Streptococcus pyogenes NOT DETECTED NOT DETECTED Final   Acinetobacter baumannii NOT DETECTED NOT DETECTED Final   Enterobacteriaceae species NOT DETECTED NOT DETECTED Final   Enterobacter cloacae complex NOT DETECTED NOT DETECTED Final   Escherichia coli NOT DETECTED NOT DETECTED Final   Klebsiella oxytoca NOT DETECTED NOT DETECTED Final   Klebsiella pneumoniae NOT DETECTED NOT DETECTED Final   Proteus species NOT DETECTED NOT DETECTED Final   Serratia marcescens NOT DETECTED NOT DETECTED Final   Haemophilus influenzae NOT DETECTED NOT DETECTED Final   Neisseria meningitidis NOT DETECTED NOT DETECTED Final   Pseudomonas aeruginosa NOT DETECTED NOT DETECTED Final   Candida albicans NOT DETECTED NOT DETECTED Final   Candida glabrata NOT DETECTED NOT DETECTED Final   Candida krusei NOT DETECTED NOT DETECTED Final   Candida parapsilosis NOT DETECTED NOT DETECTED Final   Candida tropicalis NOT DETECTED NOT  DETECTED Final  Blood culture (routine x 2)     Status: None   Collection Time: 02/19/2016 12:32 PM  Result Value Ref Range Status   Specimen Description BLOOD LEFT HAND  Final   Special Requests   Final    BOTTLES DRAWN AEROBIC AND ANAEROBIC AER 13ML ANA 13ML   Culture NO GROWTH 5 DAYS  Final   Report Status 02/16/2016 FINAL  Final  MRSA PCR Screening     Status: None   Collection Time: 01/26/2016 10:32 PM  Result Value Ref Range Status   MRSA by PCR NEGATIVE NEGATIVE Final    Comment:        The GeneXpert MRSA Assay (FDA approved for NASAL specimens only), is one component of a comprehensive MRSA colonization surveillance program. It is not intended to diagnose MRSA infection nor to guide or monitor treatment for MRSA infections.   Culture, expectorated sputum-assessment     Status: None   Collection Time: 01/28/2016  3:24 PM  Result Value Ref Range Status   Specimen Description SPUTUM  Final   Special Requests Normal  Final   Sputum evaluation   Final    Sputum specimen not acceptable for testing.  Please recollect.   RESULT CALLED TO, READ BACK BY AND VERIFIED WITH: Champ Mungo 02/04/2016 1608 KLW    Report Status 02/09/2016 FINAL  Final  Culture, blood (single) w Reflex to ID Panel     Status: None (Preliminary result)   Collection Time: 02/17/16  2:45 PM  Result Value Ref Range Status   Specimen Description BLOOD RIGHT ANTECUBITAL  Final   Special Requests   Final    BOTTLES DRAWN AEROBIC AND ANAEROBIC ANA 8CC AER [place]   Culture NO GROWTH 3 DAYS  Final   Report Status PENDING  Incomplete  Culture, blood (Routine X 2) w Reflex to ID Panel     Status: None (Preliminary result)   Collection Time: 02/18/16  6:12 AM  Result Value Ref Range Status   Specimen Description BLOOD R HAND  Final   Special Requests BOTTLES DRAWN AEROBIC AND ANAEROBIC 1ML  Final   Culture NO GROWTH 2 DAYS  Final   Report Status PENDING  Incomplete  Culture, blood (Routine X 2) w Reflex to ID Panel      Status: None (Preliminary result)   Collection Time: 02/18/16  6:12 AM  Result Value Ref Range Status   Specimen Description BLOOD LEFT HAND  Final   Special Requests BOTTLES DRAWN AEROBIC AND ANAEROBIC 4ML  Final   Culture NO GROWTH 2 DAYS  Final  Report Status PENDING  Incomplete     Coagulation Studies:  Recent Labs  02/18/16 0612 02/19/16 0513 02/27/2016 0512  LABPROT 20.1* 19.8* 17.8*  INR 1.69 1.66 1.45    Urinalysis: No results for input(s): COLORURINE, LABSPEC, PHURINE, GLUCOSEU, HGBUR, BILIRUBINUR, KETONESUR, PROTEINUR, UROBILINOGEN, NITRITE, LEUKOCYTESUR in the last 72 hours.  Invalid input(s): APPERANCEUR      Imaging: No results found.   Assessment & Plan: Pt is a 83 y.o. 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 ESRD/ [facility] Started with HD  -  Patient due for PermCath placement today. Outpatient hemodialysis placement pending.  2. Shortness of breath, pulm edema Overall significantly improved from admission. Continue ultrafiltration with dialysis.   3.  Lower extremity edema - We will plan for ultrafiltration target of 1.5-2 kg tomorrow.   4. Hyperkalemia and hyponatremia Consider rechecking potassium and serum sodium tomorrow.  5. Bleeding around catheter site -  Patient for PermCath placement today. We will landed discontinue temporary dialysis catheter once PermCath in place.

## 2016-02-20 NOTE — Progress Notes (Signed)
Pre-hd tx 

## 2016-02-20 NOTE — Progress Notes (Signed)
Spoke to ICU charge RN concerning pending transfer for patient, awaiting bed assignment. MD paged to relay critical ABG result, awaiting callback. Dr Holley Raring placing orders for patient to receive dialysis upon transfer to ICU.

## 2016-02-20 NOTE — Op Note (Signed)
OPERATIVE NOTE    PRE-OPERATIVE DIAGNOSIS: 1. ESRD   POST-OPERATIVE DIAGNOSIS: same as above  PROCEDURE: 1. Ultrasound guidance for vascular access to the right internal jugular vein 2. Fluoroscopic guidance for placement of catheter 3. Placement of a 19 cm tip to cuff tunneled hemodialysis catheter via the right internal jugular vein  SURGEON: Leotis Pain, MD  ANESTHESIA:  Local   ESTIMATED BLOOD LOSS: 10 cc  FLUORO TIME: less than one minute  CONTRAST: none  FINDING(S): 1.  Patent right internal jugular vein  SPECIMEN(S):  None  INDICATIONS:   Joan Erickson is a 83 y.o.female who presents with ESRD.  The patient needs long term dialysis access for their ESRD, and a Permcath is necessary.  Risks and benefits are discussed and informed consent is obtained.    DESCRIPTION: After obtaining full informed written consent, the patient was brought back to the vascular suited. The patient's right neck and chest were sterilely prepped and draped in a sterile surgical field was created.  The right internal jugular vein was visualized with ultrasound and found to be patent. It was then accessed under direct ultrasound guidance and a permanent image was recorded. A wire was placed. After skin nick and dilatation, the peel-away sheath was placed over the wire. I then turned my attention to an area under the clavicle. Approximately 1-2 fingerbreadths below the clavicle a small counterincision was created and tunneled from the subclavicular incision to the access site. Using fluoroscopic guidance, a 19 centimeter tip to cuff tunneled hemodialysis catheter was selected, and tunneled from the subclavicular incision to the access site. It was then placed through the peel-away sheath and the peel-away sheath was removed. Using fluoroscopic guidance the catheter tips were parked in the right atrium. The appropriate distal connectors were placed. It withdrew blood well and flushed easily with heparinized  saline and a concentrated heparin solution was then placed. It was secured to the chest wall with 2 Prolene sutures. The access incision was closed single 4-0 Monocryl. A 4-0 Monocryl pursestring suture was placed around the exit site. Sterile dressings were placed. The patient tolerated the procedure well and was taken to the recovery room in stable condition.  COMPLICATIONS: None  CONDITION: Stable  Leotis Pain  10/28/2015, 12:56 PM   This note was created with Dragon Medical transcription system. Any errors in dictation are purely unintentional.

## 2016-02-20 NOTE — Progress Notes (Signed)
ANTICOAGULATION CONSULT NOTE - Follow Up Consult  Pharmacy Consult for warfarin  Indication: atrial fibrillation   No Known Allergies  Patient Measurements: Height: 5' (152.4 cm) Weight: 139 lb 3.2 oz (63.1 kg) IBW/kg (Calculated) : 45.5  Vital Signs: Temp: 98.4 F (36.9 C) (02/01 0958) Temp Source: Oral (02/01 0958) BP: 127/64 (02/01 1502) Pulse Rate: 76 (02/01 1502)  Labs:  Recent Labs  02/18/16 0026  02/18/16 0102 02/18/16 0612 02/18/16 1152 02/19/16 0513 03/08/2016 0512  HGB  --   < > 8.5* 8.1*  --   --  7.4*  HCT  --   --  25.1* 24.5*  --   --  22.0*  PLT  --   --  368 308  --   --  267  LABPROT  --   --   --  20.1*  --  19.8* 17.8*  INR  --   --   --  1.69  --  1.66 1.45  CREATININE  --   --  3.78* 3.86*  --   --   --   TROPONINI 0.07*  --   --  0.05* 0.06*  --   --   < > = values in this interval not displayed.  Estimated Creatinine Clearance: 9.3 mL/min (by C-G formula based on SCr of 3.86 mg/dL (H)).   Medical History: Past Medical History:  Diagnosis Date  . Asthma   . Atrial fibrillation ([place])   . GERD (gastroesophageal reflux disease)   . History of colon polyps   . Hypercholesterolemia   . Hypertension   . Hypogammaglobulinemia ([facility])    IgA  . Warfarin anticoagulation   . Xanthogranulomatous pyelonephritis    s/p right nephrectomy    Medications:  Prescriptions Prior to Admission  Medication Sig Dispense Refill Last Dose  . acetaminophen (TYLENOL) 500 MG tablet Take 500 mg by mouth every 6 (six) hours as needed.   prn at prn  . ADVAIR DISKUS 250-50 MCG/DOSE AEPB INHALE 1 PUFF BY MOUTH TWICE A DAY 60 each 5 02/10/2016 at 2100  . chlorthalidone (HYGROTON) 25 MG tablet Take 0.5 tablets (12.5 mg total) by mouth daily. 30 tablet 2 02/03/2016 at 0900  . Cholecalciferol (VITAMIN D) 2000 UNITS tablet Take 2,000 Units by mouth daily.   02/03/2016 at 0900  . citalopram (CELEXA) 10 MG tablet Take 1 tablet (10 mg total) by mouth daily. 90 tablet 1 02/05/2016  at 0900  . diltiazem (TIAZAC) 360 MG 24 hr capsule Take 1 capsule (360 mg total) by mouth daily. 90 capsule 1 02/04/2016 at 0900  . finasteride (PROSCAR) 5 MG tablet    01/25/2016 at 0900  . fluticasone (FLONASE) 50 MCG/ACT nasal spray USE 2 SPRAYS IN EACH NOSTRIL ONCE A DAY 16 g 5 02/10/2016 at Unknown time  . losartan (COZAAR) 100 MG tablet TAKE 1 TABLET BY MOUTH ONCE A DAY (Patient taking differently: tk 0.5 t qd) 30 tablet 5 02/02/2016 at 0900  . montelukast (SINGULAIR) 10 MG tablet Take 1 tablet (10 mg total) by mouth daily. 90 tablet 1 01/21/2016 at 0900  . pravastatin (PRAVACHOL) 20 MG tablet TAKE 1 TABLET (20 MG TOTAL) BY MOUTH DAILY. 30 tablet 8 02/10/2016 at 2100  . PROAIR HFA 108 (90 Base) MCG/ACT inhaler INHALE 2 PUFF EVERY SIX HOURS AS NEEDED 8.5 Inhaler 2 02/10/2016 at 0900  . TRADJENTA 5 MG TABS tablet TAKE 1 TABLET (5 MG TOTAL) BY MOUTH DAILY. 30 tablet 11 02/16/2016 at 0900  . warfarin (COUMADIN)  1 MG tablet Take 1 mg by mouth at bedtime. Pt takes 0.5 t in addition to the 5 mg dose  4 02/10/2016 at 2100  . warfarin (COUMADIN) 5 MG tablet Take 5 mg by mouth at bedtime. Pt takes 5 mg dose along with 0.5 of a 1 mg tab qhs   02/10/2016 at 2100  . Blood Glucose Monitoring Suppl (ONE TOUCH ULTRA SYSTEM KIT) W/DEVICE KIT 1 kit by Does not apply route once. 1 each 0 Taking  . eplerenone (INSPRA) 25 MG tablet TAKE 1 TABLET (25 MG TOTAL) BY MOUTH ONCE DAILY FOR 14 DAYS.  0 Taking  . fluticasone (FLONASE) 50 MCG/ACT nasal spray USE 2 SPRAYS IN EACH NOSTRIL ONCE A DAY 16 g 1 Taking  . ONE TOUCH ULTRA TEST test strip CHECK BLOOD SUGAR 2 TIMES A DAY DX: E11.9 100 each 3 Taking   Scheduled:  . [MAR Hold] citalopram  10 mg Oral Daily  . [MAR Hold] diltiazem  360 mg Oral Daily  . [MAR Hold] feeding supplement (ENSURE ENLIVE)  237 mL Oral BID BM  . [MAR Hold] finasteride  5 mg Oral Daily  . [MAR Hold] fluticasone  2 spray Each Nare Daily  . [MAR Hold] heparin subcutaneous  5,000 Units Subcutaneous Q8H  .  [MAR Hold] insulin aspart  0-5 Units Subcutaneous QHS  . [MAR Hold] insulin aspart  0-9 Units Subcutaneous TID WC  . [MAR Hold] metoprolol tartrate  50 mg Oral Q6H  . [MAR Hold] mometasone-formoterol  2 puff Inhalation BID  . [MAR Hold] montelukast  10 mg Oral Daily  . [MAR Hold] pravastatin  20 mg Oral q1800  . [MAR Hold] sodium chloride flush  3 mL Intravenous Q12H    Assessment: Pharmacy consulted to dose and monitor warfarin therapy in this 83 year old woman with atrial fibrillation. Warfarin was held since 1/29 and patient is now s/p permcath placement.   Goal of Therapy:  INR 2-3 Monitor platelets by anticoagulation protocol: Yes   Plan:  Patient required flumazenil in recovery room and is being admitted to ICU. Per conversation with Dr. Margaretmary Eddy, will resume Coumadin 2/2.   Napoleon Form, [place]  Clinical Pharmacist 02/24/2016 4:00 PM

## 2016-02-20 NOTE — H&P (Signed)
[facility] History & Physical Update  The patient was interviewed and re-examined.  The patient's previous History and Physical has been reviewed and is unchanged.  There is no change in the plan of care. We plan to proceed with the scheduled procedure.  Leotis Pain, MD  03/16/2016, 11:20 AM

## 2016-02-20 NOTE — Progress Notes (Signed)
Patient returned from procedure lethargic, oxygen saturation declined shortly after return from procedure. Patient was unarousable to sternal rub, shallow/agonal respirations, placed on non-rebreather with monimal improvements, bagged until reversal agent was administered and patient's breathing improved with oxygen saturation returning to WNL. Patient required second dose of reversal at 1300 for shallow breathing and lethargy with immediate improvement. Patient now alert. Notified Dr Holley Raring of Dr Bunnie Domino request to dialyze patient today if possible, awaiting notification. Notified floor RN that reversals were administered and that patient would be in recovery for additional time. Patient's vital signs are stable at this time, will wean from NRB as tolerated. Right chest dressing CDI.

## 2016-02-20 NOTE — Progress Notes (Signed)
Hemodialysis: Notified by specials that patient getting ready to be transferred to ICU.

## 2016-02-20 NOTE — Progress Notes (Signed)
Hemodialysis: Notifed by Dr.Lateef at 1400 about patient to have dialysis treatment today due to patient having to be given several doses of  reversal agents by specials department after permcath placement.Upon arrival to ICU patient had not been assigned a room or ICU staff was not aware of patient at this time.

## 2016-02-20 NOTE — Progress Notes (Signed)
Hemodialysis treatment started. 

## 2016-02-20 NOTE — Progress Notes (Signed)
Transfer patient to ICU per Dr. Lucky Cowboy due to patient requiring reversals. Lovena Le RN on 2A notified. Bed request submitted.

## 2016-02-20 NOTE — Progress Notes (Signed)
Pos hd tx 

## 2016-02-20 NOTE — Progress Notes (Signed)
Hemodialysis: Spoke with Specials department,patient currently still there and bed assignment has been placed,but no current ICU bed assigned at this time.Specials estimated that patient will not be transferred until about 3pm-4pm.Will continue to check on patients arrival

## 2016-02-20 NOTE — Progress Notes (Signed)
[facility] [place] NAME: Joan Erickson    MR#:  [id number]  DATE OF BIRTH:  Jun 14, 1931  SUBJECTIVE:  Patient is doing okay , transferred to ICU for atrial fibrillation with RVR overnight  Now rate  controlled   REVIEW OF SYSTEMS:   Review of Systems  Constitutional: Negative for chills, fever and weight loss.  HENT: Negative for ear discharge, ear pain and nosebleeds.   Eyes: Negative for blurred vision, pain and discharge.  Respiratory: Negative for sputum production, shortness of breath, wheezing and stridor.   Cardiovascular: Negative for chest pain, palpitations, orthopnea and PND.  Gastrointestinal: Negative for abdominal pain, diarrhea, nausea and vomiting.  Genitourinary: Negative for frequency and urgency.  Musculoskeletal: Negative for back pain and joint pain.  Neurological: Positive for weakness. Negative for sensory change, speech change and focal weakness.  Psychiatric/Behavioral: Negative for depression and hallucinations. The patient is not nervous/anxious.    Tolerating Diet:yes  DRUG ALLERGIES:  No Known Allergies  VITALS:  Blood pressure 127/64, pulse 76, temperature 98.4 F (36.9 C), temperature source Oral, resp. rate 18, height 5' (1.524 m), weight 63.1 kg (139 lb 3.2 oz), SpO2 95 %.  PHYSICAL EXAMINATION:   Physical Exam  GENERAL:  83 y.o.-year-old patient lying in the bed with no acute distress.  EYES: Pupils equal, round, reactive to light and accommodation. No scleral icterus. Extraocular muscles intact.  HEENT: Head atraumatic, normocephalic. Oropharynx and nasopharynx clear.  NECK:  Supple, no jugular venous distention. No thyroid enlargement, no tenderness.  LUNGS: Normal breath sounds bilaterally, no wheezing, rales, rhonchi. No use of accessory muscles of respiration.  CARDIOVASCULAR: Irregularly irregular No murmurs, rubs, or gallops.  ABDOMEN: Soft, nontender, nondistended. Bowel sounds  present. No organomegaly or mass.  EXTREMITIES: No bleeding noticed on the right groin area at the dialysis catheter site  No cyanosis, clubbing or edema b/l.    NEUROLOGIC: Cranial nerves II through XII are intact. No focal Motor or sensory deficits b/l.   PSYCHIATRIC:  patient is alert and oriented x 3.  SKIN: No obvious rash, lesion, or ulcer.  LABORATORY PANEL:  CBC  Recent Labs Lab 03/09/2016 0512  WBC 5.5  HGB 7.4*  HCT 22.0*  PLT 267    Chemistries   Recent Labs Lab 02/18/16 0612  NA 129*  K 4.3  CL 97*  CO2 24  GLUCOSE 180*  BUN 34*  CREATININE 3.86*  CALCIUM 7.9*  MG 2.1   Cardiac Enzymes  Recent Labs Lab 02/18/16 1152  TROPONINI 0.06*   RADIOLOGY:  No results found. ASSESSMENT AND PLAN:  Joan Erickson  is a 83 y.o. female with a known history of A. fib, hypertension, hyperlipidemia, CKD stage 4 and asthma. The patient was treated for pneumonia with antibiotics a month ago by her primary care doctor. She has been feeling increasingly weak and shortness of breath on exertion for the past few days. Started to have a palpitation today and feels cold. Her PCP told her that her heart rate was fast and sent patient to ED.   *Acute hypoxic respiratory failure with hypercapnia secondary to sedation for procedure and poor inspiratory effort BiPAP Monitor patient closely in ICU Several doses of flumazenil was given following the procedure after permacath placement. Patient is arousable during my examination  pulmonology consult placed     * Acute on chrnicA. fib with RVR. Resolved - -Currently Coumadin on hold for permacath placement . We will resume Coumadin from  tomorrow if vascular is agreeable - ultrasound with no evidence of DVT and upper extremity  -Pauseswere noticed while patient was on amiodarone discontinued Continue Cardizem CD 360 mg by mouth   *SVT-resolved -Status post rapid response and adenosine  * Pneumonia - Ruled out - normal  procalcitonin - stopped Abx - ID c/s as blood c/s growing GPR - likely false + Dr. Ola Spurr has recommended repeat blood cultures and if they are negative for the next 48 hours okay to the permacath placement. -  *Elevated troponin, due to  A. fib and renal failure, neg serial troponins and cardiology following, hold Coumadin.   DVT prophylaxis with heparin subcutaneous. Discussed with cardiology not recommending therapeutic Anticoagulation at this time  *Acute renal failure /Hyponatremia: Na improved from 125-131  nephro following.  Nephrologist considering hemodialysis soon after the patient comes to the ICU today  Patient had permacath placement 03/07/2016   *Diabetes: sliding scale. Hold Tradjenta  *Insomnia Benadryl as needed  Case discussed with Care Management/Social Worker. Management plans discussed with the patient,  daughter and they are in agreement.   CODE STATUS: full  DVT Prophylaxis: heparin  TOTAL  Critical careTIME TAKING CARE OF THIS PATIENT: 58minutes.  >50% time spent on counselling and coordination of care  POSSIBLE D/C IN 2 DAYS, DEPENDING ON CLINICAL CONDITION.  Note: This dictation was prepared with Dragon dictation along with smaller phrase technology. Any transcriptional errors that result from this process are unintentional.  Nicholes Mango M.D on 03/04/2016 at 4:04 PM  Between 7am to 6pm - Pager - [phone]  After 6pm go to [email or web address] - password [email or web address] [facility]  Office  [phone]  CC: Primary care physician; Einar Pheasant, MD

## 2016-02-20 NOTE — Progress Notes (Signed)
Report called to ICU. Dialysis notified of patient move.

## 2016-02-20 NOTE — Progress Notes (Signed)
Hemodialysis completed. 

## 2016-02-20 NOTE — Progress Notes (Signed)
Patient to transfer to ICU from [place]. Brad RN (ICU) called to receive report from this RN. Belongings taken to ICU, family aware of transfer.

## 2016-02-20 DEATH — deceased

## 2016-02-21 ENCOUNTER — Other Ambulatory Visit: Payer: Self-pay | Admitting: Internal Medicine

## 2016-02-21 ENCOUNTER — Encounter: Payer: Self-pay | Admitting: Vascular Surgery

## 2016-02-21 LAB — GLUCOSE, CAPILLARY
Glucose-Capillary: 124 mg/dL — ABNORMAL HIGH (ref 65–99)
Glucose-Capillary: 243 mg/dL — ABNORMAL HIGH (ref 65–99)
Glucose-Capillary: 176 mg/dL — ABNORMAL HIGH (ref 65–99)
Glucose-Capillary: 190 mg/dL — ABNORMAL HIGH (ref 65–99)

## 2016-02-21 LAB — CBC
HEMATOCRIT: 23.1 % — AB (ref 35.0–47.0)
HEMOGLOBIN: 8 g/dL — AB (ref 12.0–16.0)
MCH: 28.7 pg (ref 26.0–34.0)
MCHC: 34.6 g/dL (ref 32.0–36.0)
MCV: 82.9 fL (ref 80.0–100.0)
Platelets: 267 10*3/uL (ref 150–440)
RBC: 2.79 MIL/uL — ABNORMAL LOW (ref 3.80–5.20)
RDW: 16.2 % — AB (ref 11.5–14.5)
WBC: 6.4 10*3/uL (ref 3.6–11.0)

## 2016-02-21 LAB — BASIC METABOLIC PANEL
Anion gap: 6 (ref 5–15)
BUN: 21 mg/dL — ABNORMAL HIGH (ref 6–20)
CALCIUM: 7.8 mg/dL — AB (ref 8.9–10.3)
CHLORIDE: 99 mmol/L — AB (ref 101–111)
CO2: 29 mmol/L (ref 22–32)
CREATININE: 3 mg/dL — AB (ref 0.44–1.00)
GFR calc non Af Amer: 14 mL/min — ABNORMAL LOW (ref >60)
GFR, EST AFRICAN AMERICAN: 16 mL/min — AB (ref >60)
GLUCOSE: 127 mg/dL — AB (ref 65–99)
Potassium: 4.3 mmol/L (ref 3.5–5.1)
Sodium: 134 mmol/L — ABNORMAL LOW (ref 135–145)

## 2016-02-21 LAB — PROTIME-INR
INR: 1.29
Prothrombin Time: 16.2 seconds — ABNORMAL HIGH (ref 11.4–15.2)

## 2016-02-21 MED ORDER — WARFARIN - PHARMACIST DOSING INPATIENT
Freq: Every day | Status: DC
  Administered 2016-02-21 – 2016-02-25 (×2)

## 2016-02-21 MED ORDER — WARFARIN SODIUM 5 MG PO TABS
5.0000 mg | ORAL_TABLET | Freq: Once | ORAL | Status: AC
  Administered 2016-02-21: 5 mg via ORAL
  Filled 2016-02-21: qty 1

## 2016-02-21 NOTE — Evaluation (Signed)
Physical Therapy Evaluation Patient Details Name: Joan Erickson MRN: [id number] DOB: [birth year] Today's Date: 02/21/2016   History of Present Illness  83 y.o. female with a known history of A. fib, hypertension, hyperlipidemia, CKD stage 4 and asthma. The patient was treated for pneumonia with antibiotics a month ago. She has had increasingly weakness and shortness of breath on exertion.  Pt had temp fem cath and has been having dialysis t/o the admission.  Clinical Impression  Pt did well with PT and showed good effort t/o the exam.  We did attempt to do some light in bed exercises/testing on room air and her sats dropped relatively quickly to the 80s. Pt stayed in the 90s most of the time on 3-4 liters, though with brief ambulation it did drop into the high 80s again.  Pt showed functional strength and good balance and confidence with standing/walking but her activity tolerance is her biggest issue at this time.  Pt will be safe to return home (dependent on improved O2 issues) with husband to assist and HHPT.    Follow Up Recommendations Home health PT    Equipment Recommendations   (O2?)    Recommendations for Other Services       Precautions / Restrictions Precautions Precautions: Fall Restrictions Weight Bearing Restrictions: No      Mobility  Bed Mobility Overal bed mobility: Independent             General bed mobility comments: Pt able to get herself up to sitting and back to supine w/o assist  Transfers Overall transfer level: Independent Equipment used: None             General transfer comment: Pt able to rise to standing and showed good confidence with getting upright and maintaining balnace.   Ambulation/Gait Ambulation/Gait assistance: Supervision Ambulation Distance (Feet): 20 Feet Assistive device: None       General Gait Details: limited ambulation secondary to O2 limitations/fatigue and dealing with CCU line/cords/etc but pt was safe and  relatively confident.  Pt is not at her baseline and her O2 sats dropped to the high 80s on 4 liters, ultimately she was safe but limited.   Stairs            Wheelchair Mobility    Modified Rankin (Stroke Patients Only)       Balance                                             Pertinent Vitals/Pain Pain Assessment: No/denies pain    Home Living Family/patient expects to be discharged to:: Private residence Living Arrangements: Spouse/significant other   Type of Home: House Home Access: Stairs to enter Entrance Stairs-Rails: None Technical brewer of Steps: 4   Home Equipment: Environmental consultant - 2 wheels      Prior Function Level of Independence: Independent         Comments: Pt active, able to drive, etc     Hand Dominance        Extremity/Trunk Assessment   Upper Extremity Assessment Upper Extremity Assessment: Overall WFL for tasks assessed    Lower Extremity Assessment Lower Extremity Assessment: Overall WFL for tasks assessed       Communication   Communication: No difficulties  Cognition Arousal/Alertness: Awake/alert Behavior During Therapy: WFL for tasks assessed/performed Overall Cognitive Status: Within Functional Limits for tasks assessed  General Comments      Exercises     Assessment/Plan    PT Assessment Patient needs continued PT services  PT Problem List Decreased strength;Decreased activity tolerance;Decreased mobility;Decreased safety awareness;Cardiopulmonary status limiting activity          PT Treatment Interventions Gait training;Stair training;Functional mobility training;Therapeutic activities;Therapeutic exercise;Balance training;Neuromuscular re-education;Patient/family education    PT Goals (Current goals can be found in the Care Plan section)  Acute Rehab PT Goals Patient Stated Goal: go home PT Goal Formulation: With patient Time For Goal Achievement:  03/06/16 Potential to Achieve Goals: Good    Frequency Min 2X/week   Barriers to discharge        Co-evaluation               End of Session Equipment Utilized During Treatment: Gait belt;Oxygen (4 liters) Activity Tolerance: Patient limited by fatigue Patient left: in bed;with call bell/phone within reach;with nursing/sitter in room Nurse Communication: Mobility status         Time: [id number] PT Time Calculation (min) (ACUTE ONLY): 26 min   Charges:   PT Evaluation $PT Eval Low Complexity: 1 Procedure     PT G Codes:        Kreg Shropshire, DPT 02/21/2016, 2:46 PM

## 2016-02-21 NOTE — Care Management (Signed)
Received chair [employer] [place] [id number] Erickson [facility] Sat 11A.  first appointment Feb 6 at 10:15.  Physical consult is pending.  Patient still has the catheter in her groin.  have instructed patient and staff of need to sit for dialysis.  Physical therapy consult is pending.  Gave patient the Welcome letter and place a copy in the shadow chart

## 2016-02-21 NOTE — Care Management Important Message (Signed)
Important Message  Patient Details  Name: NICEY KECK MRN: [id number] Date of Birth: 1931/07/02   Medicare Important Message Given:  Yes    Katrina Stack, RN 02/21/2016, 11:34 AM

## 2016-02-21 NOTE — Progress Notes (Signed)
Subjective: Patient was rather somnolent after PermCath placement yesterday. Therefore she was transitioned to the critical care unit. This morning she is awake and alert and following commands. She remains on Cardizem and amiodarone drips. Patient underwent hemodialysis yesterday as well.  Weight trends: Filed Weights   02/16/16 1728 02/19/16 0955 02/19/16 1416  Weight: 63.1 kg (139 lb 1.8 oz) 63.6 kg (140 lb 3.4 oz) 63.1 kg (139 lb 3.2 oz)    Physical Exam: General: No acute distress  HEENT Anicteric, moist oral mucous membranes  Neck: supple  Lungs: Mild basilar rales, normal effort  Heart:: irregular no rubs  Abdomen: Soft, nontender, nondistended  Extremities: 1+ plus lower extremity edema  Neurologic: Awake, alert, following commands  Skin: Warm, dry, normal turgor  Access: Right femoral temp cath, R IJ permcath as well          Lab results: Basic Metabolic Panel:  Recent Labs Lab 02/18/16 0102 02/18/16 0612 02/18/16 1152 02/19/16 0513 02/21/16 0548  NA 128* 129*  --   --  134*  K 4.4 4.3  --   --  4.3  CL 95* 97*  --   --  99*  CO2 29 24  --   --  29  GLUCOSE 219* 180*  --   --  127*  BUN 31* 34*  --   --  21*  CREATININE 3.78* 3.86*  --   --  3.00*  CALCIUM 8.0* 7.9*  --   --  7.8*  MG  --  2.1  --   --   --   PHOS  --   --  5.9* 4.7*  --     Liver Function Tests: No results for input(s): AST, ALT, ALKPHOS, BILITOT, PROT, ALBUMIN in the last 168 hours. No results for input(s): LIPASE, AMYLASE in the last 168 hours. No results for input(s): AMMONIA in the last 168 hours.  CBC:  Recent Labs Lab 02/18/16 0102  03/17/2016 0512 02/21/16 0548  WBC 9.2  < > 5.5 6.4  NEUTROABS 8.3*  --   --   --   HGB 8.5*  < > 7.4* 8.0*  HCT 25.1*  < > 22.0* 23.1*  MCV 83.9  < > 82.7 82.9  PLT 368  < > 267 267  < > = values in this interval not displayed.  Cardiac Enzymes:  Recent Labs Lab 02/18/16 1152  TROPONINI 0.06*    BNP: Invalid input(s):  POCBNP  CBG:  Recent Labs Lab 03/06/2016 0720 02/29/2016 1404 03/04/2016 1657 03/10/2016 2058 02/21/16 0723  GLUCAP 123* 144* 145* 99 124*    Microbiology: Recent Results (from the past 720 hour(s))  Blood culture (routine x 2)     Status: Abnormal   Collection Time: 02/05/2016 12:31 PM  Result Value Ref Range Status   Specimen Description BLOOD RIGHT AC  Final   Special Requests   Final    BOTTLES DRAWN AEROBIC AND ANAEROBIC AER 11ML ANA 8ML   Culture  Setup Time   Final    GRAM POSITIVE COCCI AEROBIC BOTTLE ONLY CRITICAL RESULT CALLED TO, READ BACK BY AND VERIFIED WITH:  [place] AT [id number] 01/28/2016 SDR    Culture (A)  Final    FACKLAMIA HOMINIS Standardized susceptibility testing for this organism is not available. Performed at [facility], [place] [address] [place], [place] [address]    Report Status 02/17/2016 FINAL  Final  Blood Culture ID Panel (Reflexed)     Status: None  Collection Time: 02/06/2016 12:31 PM  Result Value Ref Range Status   Enterococcus species NOT DETECTED NOT DETECTED Final   Listeria monocytogenes NOT DETECTED NOT DETECTED Final   Staphylococcus species NOT DETECTED NOT DETECTED Final   Staphylococcus aureus NOT DETECTED NOT DETECTED Final   Streptococcus species NOT DETECTED NOT DETECTED Final   Streptococcus agalactiae NOT DETECTED NOT DETECTED Final   Streptococcus pneumoniae NOT DETECTED NOT DETECTED Final   Streptococcus pyogenes NOT DETECTED NOT DETECTED Final   Acinetobacter baumannii NOT DETECTED NOT DETECTED Final   Enterobacteriaceae species NOT DETECTED NOT DETECTED Final   Enterobacter cloacae complex NOT DETECTED NOT DETECTED Final   Escherichia coli NOT DETECTED NOT DETECTED Final   Klebsiella oxytoca NOT DETECTED NOT DETECTED Final   Klebsiella pneumoniae NOT DETECTED NOT DETECTED Final   Proteus species NOT DETECTED NOT DETECTED Final   Serratia marcescens NOT DETECTED NOT DETECTED Final   Haemophilus influenzae NOT DETECTED  NOT DETECTED Final   Neisseria meningitidis NOT DETECTED NOT DETECTED Final   Pseudomonas aeruginosa NOT DETECTED NOT DETECTED Final   Candida albicans NOT DETECTED NOT DETECTED Final   Candida glabrata NOT DETECTED NOT DETECTED Final   Candida krusei NOT DETECTED NOT DETECTED Final   Candida parapsilosis NOT DETECTED NOT DETECTED Final   Candida tropicalis NOT DETECTED NOT DETECTED Final  Blood culture (routine x 2)     Status: None   Collection Time: 02/14/2016 12:32 PM  Result Value Ref Range Status   Specimen Description BLOOD LEFT HAND  Final   Special Requests   Final    BOTTLES DRAWN AEROBIC AND ANAEROBIC AER 13ML ANA 13ML   Culture NO GROWTH 5 DAYS  Final   Report Status 02/16/2016 FINAL  Final  MRSA PCR Screening     Status: None   Collection Time: 01/29/2016 10:32 PM  Result Value Ref Range Status   MRSA by PCR NEGATIVE NEGATIVE Final    Comment:        The GeneXpert MRSA Assay (FDA approved for NASAL specimens only), is one component of a comprehensive MRSA colonization surveillance program. It is not intended to diagnose MRSA infection nor to guide or monitor treatment for MRSA infections.   Culture, expectorated sputum-assessment     Status: None   Collection Time: 02/04/2016  3:24 PM  Result Value Ref Range Status   Specimen Description SPUTUM  Final   Special Requests Normal  Final   Sputum evaluation   Final    Sputum specimen not acceptable for testing.  Please recollect.   RESULT CALLED TO, READ BACK BY AND VERIFIED WITH: Champ Mungo 02/17/2016 1608 KLW    Report Status 01/26/2016 FINAL  Final  Culture, blood (single) w Reflex to ID Panel     Status: None (Preliminary result)   Collection Time: 02/17/16  2:45 PM  Result Value Ref Range Status   Specimen Description BLOOD RIGHT ANTECUBITAL  Final   Special Requests   Final    BOTTLES DRAWN AEROBIC AND ANAEROBIC ANA 8CC AER [place]   Culture NO GROWTH 4 DAYS  Final   Report Status PENDING  Incomplete  Culture,  blood (Routine X 2) w Reflex to ID Panel     Status: None (Preliminary result)   Collection Time: 02/18/16  6:12 AM  Result Value Ref Range Status   Specimen Description BLOOD R HAND  Final   Special Requests BOTTLES DRAWN AEROBIC AND ANAEROBIC 1ML  Final   Culture NO GROWTH 3 DAYS  Final  Report Status PENDING  Incomplete  Culture, blood (Routine X 2) w Reflex to ID Panel     Status: None (Preliminary result)   Collection Time: 02/18/16  6:12 AM  Result Value Ref Range Status   Specimen Description BLOOD LEFT HAND  Final   Special Requests BOTTLES DRAWN AEROBIC AND ANAEROBIC 4ML  Final   Culture NO GROWTH 3 DAYS  Final   Report Status PENDING  Incomplete     Coagulation Studies:  Recent Labs  02/19/16 0513 02/26/2016 0512 02/21/16 0548  LABPROT 19.8* 17.8* 16.2*  INR 1.66 1.45 1.29    Urinalysis: No results for input(s): COLORURINE, LABSPEC, PHURINE, GLUCOSEU, HGBUR, BILIRUBINUR, KETONESUR, PROTEINUR, UROBILINOGEN, NITRITE, LEUKOCYTESUR in the last 72 hours.  Invalid input(s): APPERANCEUR      Imaging: No results found.   Assessment & Plan: Pt is a 83 y.o. 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 ESRD/ [facility] Started with HD  - We have requested that nursing remove the temporary right femoral dialysis catheter. She has a right internal jugular PermCath in place now. She completed hemodialysis yesterday. No urgent indication for dialysis today. We will plan for dialysis again tomorrow.  2. Shortness of breath, pulm edema Breathing comfortably at the moment. Off of BiPAP. Continue to monitor status.   3.  Lower extremity edema - Continue ultrafiltration with hemodialysis.  4. Hyperkalemia and hyponatremia Sodium up to 134 and potassium normalized now  at 4.3. Continue to monitor.  5. Bleeding around catheter site -  Requested nursing to remove temporary femoral dialysis catheter today.

## 2016-02-21 NOTE — Progress Notes (Addendum)
Initial Nutrition Assessment  DOCUMENTATION CODES:   Not applicable  INTERVENTION:  -Encouraged adequate protein intake including protein source at each meal. Pt also receiving bedtime snack -Continue Ensure Enlive po BID, each supplement provides 350 kcal and 20 grams of protein -Reviewed basics of dialysis diet. Pt and family report they already have received some diet education and were able to demonstrate some basic knowledge of high phosphorus, high potassium foods. Encouraged pt and family to follow up with outpatient RD at dialysis facility as good resource for diet/nutrition at discharge. RD will be happy to follow-up and provide further diet education prior to discharge if requested  NUTRITION DIAGNOSIS:   Inadequate oral intake related to acute illness as evidenced by per patient/family report.  GOAL:   Patient will meet greater than or equal to 90% of their needs  MONITOR:   PO intake, Supplement acceptance, Labs, Weight trends  REASON FOR ASSESSMENT:   LOS    ASSESSMENT:   83 yo female admitted with sepsis with pneumonia, acute on CKD with hyperkalemia and hyponatremia, afib with RVR. 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, type 2 DM  New start dialysis Permcath placed 02/26/2016 Labs: Creatinine 3.00, phosphorus 4.7, sodium 134 Meds: reviewed  Pt reports appetite improving. Good appetite in general prior to admission with poor appetite with acute illness. Recorded po in take 85% of meals on average. Pt drinking EnsureEnlive on visit today. Pt reports she is drinking at least 1 per day. Pt reports wt has been stable.   Diet Order:  Diet renal/carb modified with fluid restriction Diet-HS Snack? Nothing; Room service appropriate? Yes; Fluid consistency: Thin  Skin:  Reviewed, no issues  Last BM:  02/21/16  Height:   Ht Readings from Last 1 Encounters:  02/06/2016 5' (1.524 m)    Weight:   Wt Readings  from Last 1 Encounters:  02/19/16 139 lb 3.2 oz (63.1 kg)   Wt Readings from Last 10 Encounters:  02/19/16 139 lb 3.2 oz (63.1 kg)  01/28/2016 129 lb 8 oz (58.7 kg)  12/26/15 128 lb 6.4 oz (58.2 kg)  10/08/15 127 lb 12.8 oz (58 kg)  07/02/15 128 lb 4 oz (58.2 kg)  01/24/15 127 lb (57.6 kg)  11/29/14 118 lb (53.5 kg)  11/14/14 116 lb (52.6 kg)  09/19/14 142 lb 6 oz (64.6 kg)  04/04/14 142 lb 4 oz (64.5 kg)    Ideal Body Weight:  45.45 kg  BMI:  Body mass index is 27.19 kg/m.  Estimated Nutritional Needs:   Kcal:  1500-1800 kcals  Protein:  76-95 g  Fluid:  1000 mL plus UOP  EDUCATION NEEDS:   Education needs addressed  Kerman Passey [place], [place], LDN [phone] Pager  [phone] Weekend/On-Call Pager

## 2016-02-21 NOTE — Progress Notes (Signed)
[facility] INFECTIOUS DISEASE PROGRESS NOTE Date of Admission:  02/17/2016     ID: Joan Erickson is a 83 y.o. female with bacteremia, ARF, SOB  Active Problems:   ARF (acute renal failure) (HCC)   Subjective: Remains af, but back in unit after permacath placed   ROS  Eleven systems are reviewed and negative except per hpi  Medications:  Antibiotics Given (last 72 hours)    Date/Time Action Medication Dose Rate   03/04/2016 1140 Given   ceFAZolin (ANCEF) IVPB 1 g/50 mL premix 1 g 100 mL/hr     . citalopram  10 mg Oral Daily  . diltiazem  360 mg Oral Daily  . feeding supplement (ENSURE ENLIVE)  237 mL Oral BID BM  . finasteride  5 mg Oral Daily  . fluticasone  2 spray Each Nare Daily  . heparin subcutaneous  5,000 Units Subcutaneous Q8H  . insulin aspart  0-5 Units Subcutaneous QHS  . insulin aspart  0-9 Units Subcutaneous TID WC  . metoprolol tartrate  50 mg Oral Q6H  . mometasone-formoterol  2 puff Inhalation BID  . montelukast  10 mg Oral Daily  . pravastatin  20 mg Oral q1800  . sodium chloride flush  3 mL Intravenous Q12H  . warfarin  5 mg Oral ONCE-1800  . Warfarin - Pharmacist Dosing Inpatient   Does not apply q1800    Objective: Vital signs in last 24 hours: Temp:  [96.1 F (35.6 C)-98.6 F (37 C)] 98.6 F (37 C) (02/02 1200) Pulse Rate:  [59-105] 61 (02/02 1400) Resp:  [11-29] 29 (02/02 1400) BP: (112-155)/(52-94) 131/56 (02/02 1400) SpO2:  [88 %-100 %] 88 % (02/02 1400) Constitutional:  oriented to person, place, and time. appears well-developed and well-nourished. No distress.  HENT: Eros/AT, PERRLA, no scleral icterus Mouth/Throat: Oropharynx is clear and moist. No oropharyngeal exudate.  Cardiovascular: Normal rate, regular rhythm and normal heart sounds.  Pulmonary/Chest: poor air movement  Neck = supple, no nuchal rigidity Abdominal: Soft. Bowel sounds are normal.  exhibits no distension. There is no tenderness.  Lymphadenopathy: no cervical  adenopathy. No axillary adenopathy Neurological: alert and oriented to person, place, and time.  Skin: Skin is warm and dry. No rash noted. No erythema.  Ext 2+ edema Psychiatric: a normal mood and affect.  behavior is normal.   Lab Results  Recent Labs  03/04/2016 0512 02/21/16 0548  WBC 5.5 6.4  HGB 7.4* 8.0*  HCT 22.0* 23.1*  NA  --  134*  K  --  4.3  CL  --  99*  CO2  --  29  BUN  --  21*  CREATININE  --  3.00*    Microbiology: Results for orders placed or performed during the hospital encounter of 01/29/2016  Blood culture (routine x 2)     Status: Abnormal   Collection Time: 01/28/2016 12:31 PM  Result Value Ref Range Status   Specimen Description BLOOD RIGHT AC  Final   Special Requests   Final    BOTTLES DRAWN AEROBIC AND ANAEROBIC AER 11ML ANA 8ML   Culture  Setup Time   Final    GRAM POSITIVE COCCI AEROBIC BOTTLE ONLY CRITICAL RESULT CALLED TO, READ BACK BY AND VERIFIED WITH:  KAREN HAYES AT [id number] 01/27/2016 SDR    Culture (A)  Final    FACKLAMIA HOMINIS Standardized susceptibility testing for this organism is not available. Performed at [facility], [place] [address] [place], [place] [address]  Report Status 02/17/2016 FINAL  Final  Blood Culture ID Panel (Reflexed)     Status: None   Collection Time: 02/01/2016 12:31 PM  Result Value Ref Range Status   Enterococcus species NOT DETECTED NOT DETECTED Final   Listeria monocytogenes NOT DETECTED NOT DETECTED Final   Staphylococcus species NOT DETECTED NOT DETECTED Final   Staphylococcus aureus NOT DETECTED NOT DETECTED Final   Streptococcus species NOT DETECTED NOT DETECTED Final   Streptococcus agalactiae NOT DETECTED NOT DETECTED Final   Streptococcus pneumoniae NOT DETECTED NOT DETECTED Final   Streptococcus pyogenes NOT DETECTED NOT DETECTED Final   Acinetobacter baumannii NOT DETECTED NOT DETECTED Final   Enterobacteriaceae species NOT DETECTED NOT DETECTED Final   Enterobacter cloacae complex NOT  DETECTED NOT DETECTED Final   Escherichia coli NOT DETECTED NOT DETECTED Final   Klebsiella oxytoca NOT DETECTED NOT DETECTED Final   Klebsiella pneumoniae NOT DETECTED NOT DETECTED Final   Proteus species NOT DETECTED NOT DETECTED Final   Serratia marcescens NOT DETECTED NOT DETECTED Final   Haemophilus influenzae NOT DETECTED NOT DETECTED Final   Neisseria meningitidis NOT DETECTED NOT DETECTED Final   Pseudomonas aeruginosa NOT DETECTED NOT DETECTED Final   Candida albicans NOT DETECTED NOT DETECTED Final   Candida glabrata NOT DETECTED NOT DETECTED Final   Candida krusei NOT DETECTED NOT DETECTED Final   Candida parapsilosis NOT DETECTED NOT DETECTED Final   Candida tropicalis NOT DETECTED NOT DETECTED Final  Blood culture (routine x 2)     Status: None   Collection Time: 01/24/2016 12:32 PM  Result Value Ref Range Status   Specimen Description BLOOD LEFT HAND  Final   Special Requests   Final    BOTTLES DRAWN AEROBIC AND ANAEROBIC AER 13ML ANA 13ML   Culture NO GROWTH 5 DAYS  Final   Report Status 02/16/2016 FINAL  Final  MRSA PCR Screening     Status: None   Collection Time: 02/10/2016 10:32 PM  Result Value Ref Range Status   MRSA by PCR NEGATIVE NEGATIVE Final    Comment:        The GeneXpert MRSA Assay (FDA approved for NASAL specimens only), is one component of a comprehensive MRSA colonization surveillance program. It is not intended to diagnose MRSA infection nor to guide or monitor treatment for MRSA infections.   Culture, expectorated sputum-assessment     Status: None   Collection Time: 02/19/2016  3:24 PM  Result Value Ref Range Status   Specimen Description SPUTUM  Final   Special Requests Normal  Final   Sputum evaluation   Final    Sputum specimen not acceptable for testing.  Please recollect.   RESULT CALLED TO, READ BACK BY AND VERIFIED WITH: Champ Mungo 02/16/2016 1608 KLW    Report Status 02/09/2016 FINAL  Final  Culture, blood (single) w Reflex to ID  Panel     Status: None (Preliminary result)   Collection Time: 02/17/16  2:45 PM  Result Value Ref Range Status   Specimen Description BLOOD RIGHT ANTECUBITAL  Final   Special Requests   Final    BOTTLES DRAWN AEROBIC AND ANAEROBIC ANA 8CC AER [place]   Culture NO GROWTH 4 DAYS  Final   Report Status PENDING  Incomplete  Culture, blood (Routine X 2) w Reflex to ID Panel     Status: None (Preliminary result)   Collection Time: 02/18/16  6:12 AM  Result Value Ref Range Status   Specimen Description BLOOD R HAND  Final  Special Requests BOTTLES DRAWN AEROBIC AND ANAEROBIC 1ML  Final   Culture NO GROWTH 3 DAYS  Final   Report Status PENDING  Incomplete  Culture, blood (Routine X 2) w Reflex to ID Panel     Status: None (Preliminary result)   Collection Time: 02/18/16  6:12 AM  Result Value Ref Range Status   Specimen Description BLOOD LEFT HAND  Final   Special Requests BOTTLES DRAWN AEROBIC AND ANAEROBIC 4ML  Final   Culture NO GROWTH 3 DAYS  Final   Report Status PENDING  Incomplete     Studies/Results: No results found. Echo 02/12/16 Study Conclusions  - Left ventricle: The cavity size was normal. There was severe   concentric hypertrophy. Systolic function was normal. The   estimated ejection fraction was in the range of 60% to 65%. - Aortic valve: Valve area (Vmax): 2.12 cm^2. - Mitral valve: There was moderate regurgitation. - Left atrium: The atrium was mildly dilated. - Right atrium: The atrium was mildly dilated. - Tricuspid valve: There was moderate regurgitation.  Assessment/Plan: Joan Erickson is a 83 y.o. female admitted with Afib RVR, Acute renal failure and PNA. No fevers, wbc nml. Ama 1/2 GPC Facklamia hominis.  Started on Ctx, azitho and vanco x one day but off since.  Given ARF had temp cath placed and getting HD.  I suspect most of her sxs are from volume overload. BCx likely contaminant.  Her echo show no vegetation.  I do not think she has active infection but  was started 1/29 on vanco and zosyn after afib and resp decompensation.  BNP was 2000.  Stopped abx 1/30 and no fevers but back in unit with  Recommendations Repeat bcx have been negative If develops fevers chills, check sputum cx and consider CT chest Thank you very much for the consult. Will follow with you.  Joan Erickson   02/21/2016, 3:54 PM

## 2016-02-21 NOTE — Progress Notes (Signed)
ANTICOAGULATION CONSULT NOTE - Follow Up Consult  Pharmacy Consult for warfarin  Indication: atrial fibrillation   No Known Allergies  Patient Measurements: Height: 5' (152.4 cm) Weight: 139 lb 3.2 oz (63.1 kg) IBW/kg (Calculated) : 45.5  Vital Signs: Temp: 98.6 F (37 C) (02/02 1200) Temp Source: Oral (02/02 1200) BP: 131/56 (02/02 1400) Pulse Rate: 61 (02/02 1400)  Labs:  Recent Labs  02/19/16 0513 03/06/2016 0512 02/21/16 0548  HGB  --  7.4* 8.0*  HCT  --  22.0* 23.1*  PLT  --  267 267  LABPROT 19.8* 17.8* 16.2*  INR 1.66 1.45 1.29  CREATININE  --   --  3.00*    Estimated Creatinine Clearance: 12 mL/min (by C-G formula based on SCr of 3 mg/dL (H)).   Medical History: Past Medical History:  Diagnosis Date  . Asthma   . Atrial fibrillation ([place])   . GERD (gastroesophageal reflux disease)   . History of colon polyps   . Hypercholesterolemia   . Hypertension   . Hypogammaglobulinemia (HCC)    IgA  . Warfarin anticoagulation   . Xanthogranulomatous pyelonephritis    s/p right nephrectomy    Medications:  Prescriptions Prior to Admission  Medication Sig Dispense Refill Last Dose  . acetaminophen (TYLENOL) 500 MG tablet Take 500 mg by mouth every 6 (six) hours as needed.   prn at prn  . ADVAIR DISKUS 250-50 MCG/DOSE AEPB INHALE 1 PUFF BY MOUTH TWICE A DAY 60 each 5 02/10/2016 at 2100  . chlorthalidone (HYGROTON) 25 MG tablet Take 0.5 tablets (12.5 mg total) by mouth daily. 30 tablet 2 01/26/2016 at 0900  . Cholecalciferol (VITAMIN D) 2000 UNITS tablet Take 2,000 Units by mouth daily.   02/01/2016 at 0900  . citalopram (CELEXA) 10 MG tablet Take 1 tablet (10 mg total) by mouth daily. 90 tablet 1 01/27/2016 at 0900  . diltiazem (TIAZAC) 360 MG 24 hr capsule Take 1 capsule (360 mg total) by mouth daily. 90 capsule 1 02/06/2016 at 0900  . finasteride (PROSCAR) 5 MG tablet    02/03/2016 at 0900  . fluticasone (FLONASE) 50 MCG/ACT nasal spray USE 2 SPRAYS IN EACH NOSTRIL  ONCE A DAY 16 g 5 02/10/2016 at Unknown time  . montelukast (SINGULAIR) 10 MG tablet Take 1 tablet (10 mg total) by mouth daily. 90 tablet 1 02/06/2016 at 0900  . pravastatin (PRAVACHOL) 20 MG tablet TAKE 1 TABLET (20 MG TOTAL) BY MOUTH DAILY. 30 tablet 8 02/10/2016 at 2100  . PROAIR HFA 108 (90 Base) MCG/ACT inhaler INHALE 2 PUFF EVERY SIX HOURS AS NEEDED 8.5 Inhaler 2 02/10/2016 at 0900  . TRADJENTA 5 MG TABS tablet TAKE 1 TABLET (5 MG TOTAL) BY MOUTH DAILY. 30 tablet 11 02/15/2016 at 0900  . warfarin (COUMADIN) 1 MG tablet Take 1 mg by mouth at bedtime. Pt takes 0.5 t in addition to the 5 mg dose  4 02/10/2016 at 2100  . warfarin (COUMADIN) 5 MG tablet Take 5 mg by mouth at bedtime. Pt takes 5 mg dose along with 0.5 of a 1 mg tab qhs   02/10/2016 at 2100  . Blood Glucose Monitoring Suppl (ONE TOUCH ULTRA SYSTEM KIT) W/DEVICE KIT 1 kit by Does not apply route once. 1 each 0 Taking  . eplerenone (INSPRA) 25 MG tablet TAKE 1 TABLET (25 MG TOTAL) BY MOUTH ONCE DAILY FOR 14 DAYS.  0 Taking  . fluticasone (FLONASE) 50 MCG/ACT nasal spray USE 2 SPRAYS IN EACH NOSTRIL ONCE A DAY  16 g 1 Taking  . ONE TOUCH ULTRA TEST test strip CHECK BLOOD SUGAR 2 TIMES A DAY DX: E11.9 100 each 3 Taking   Scheduled:  . citalopram  10 mg Oral Daily  . diltiazem  360 mg Oral Daily  . feeding supplement (ENSURE ENLIVE)  237 mL Oral BID BM  . finasteride  5 mg Oral Daily  . fluticasone  2 spray Each Nare Daily  . heparin subcutaneous  5,000 Units Subcutaneous Q8H  . insulin aspart  0-5 Units Subcutaneous QHS  . insulin aspart  0-9 Units Subcutaneous TID WC  . metoprolol tartrate  50 mg Oral Q6H  . mometasone-formoterol  2 puff Inhalation BID  . montelukast  10 mg Oral Daily  . pravastatin  20 mg Oral q1800  . sodium chloride flush  3 mL Intravenous Q12H  . warfarin  5 mg Oral ONCE-1800  . Warfarin - Pharmacist Dosing Inpatient   Does not apply q1800    Assessment: Pharmacy consulted to dose and monitor warfarin therapy  in this 83 year old woman with atrial fibrillation. Warfarin was held since 1/29 and patient is now s/p permcath placement. Home warfarin dose is 5.18m daily.   Goal of Therapy:  INR 2-3 Monitor platelets by anticoagulation protocol: Yes   Plan:  INR subtherapeutic at 1.29. Patient had warfarin held since the 1/28. Will start patient with a load dose of warfarin 51mtoday. Suspect that warfarin will need to be decreased from home dose as patient started on amiodarone in the hospital. Patient is currently receiving DVT ppx with heparin at this time till INR comes up.   KeLoree FeePharmD  02/21/2016 3:53 PM

## 2016-02-21 NOTE — Progress Notes (Signed)
[facility] [place] NAME: Joan Erickson    MR#:  [id number]  DATE OF BIRTH:  20-Jun-1931  SUBJECTIVE:  Patient is Very awake and alert. Rate controlled on amiodarone and Cardizem drip Off BiPAP husband at bedside   REVIEW OF SYSTEMS:   Review of Systems  Constitutional: Negative for chills, fever and weight loss.  HENT: Negative for ear discharge, ear pain and nosebleeds.   Eyes: Negative for blurred vision, pain and discharge.  Respiratory: Negative for sputum production, shortness of breath, wheezing and stridor.   Cardiovascular: Negative for chest pain, palpitations, orthopnea and PND.  Gastrointestinal: Negative for abdominal pain, diarrhea, nausea and vomiting.  Genitourinary: Negative for frequency and urgency.  Musculoskeletal: Negative for back pain and joint pain.  Neurological: Positive for weakness. Negative for sensory change, speech change and focal weakness.  Psychiatric/Behavioral: Negative for depression and hallucinations. The patient is not nervous/anxious.    Tolerating Diet:yes  DRUG ALLERGIES:  No Known Allergies  VITALS:  Blood pressure 117/67, pulse 64, temperature 98.6 F (37 C), temperature source Oral, resp. rate 16, height 5' (1.524 m), weight 63.1 kg (139 lb 3.2 oz), SpO2 95 %.  PHYSICAL EXAMINATION:   Physical Exam  GENERAL:  83 y.o.-year-old patient lying in the bed with no acute distress.  EYES: Pupils equal, round, reactive to light and accommodation. No scleral icterus. Extraocular muscles intact.  HEENT: Head atraumatic, normocephalic. Oropharynx and nasopharynx clear.  NECK:  Supple, no jugular venous distention. No thyroid enlargement, no tenderness.  LUNGS: Normal breath sounds bilaterally, no wheezing, rales, rhonchi. No use of accessory muscles of respiration.  CARDIOVASCULAR: Irregularly irregular No murmurs, rubs, or gallops.  ABDOMEN: Soft, nontender, nondistended. Bowel sounds  present. No organomegaly or mass.  EXTREMITIES: No bleeding noticed on the right groin area at the dialysis catheter site  No cyanosis, clubbing or edema b/l.    NEUROLOGIC: Cranial nerves II through XII are intact. No focal Motor or sensory deficits b/l.   PSYCHIATRIC:  patient is alert and oriented x 3.  SKIN: No obvious rash, lesion, or ulcer.  LABORATORY PANEL:  CBC  Recent Labs Lab 02/21/16 0548  WBC 6.4  HGB 8.0*  HCT 23.1*  PLT 267    Chemistries   Recent Labs Lab 02/18/16 0612 02/21/16 0548  NA 129* 134*  K 4.3 4.3  CL 97* 99*  CO2 24 29  GLUCOSE 180* 127*  BUN 34* 21*  CREATININE 3.86* 3.00*  CALCIUM 7.9* 7.8*  MG 2.1  --    Cardiac Enzymes  Recent Labs Lab 02/18/16 1152  TROPONINI 0.06*   RADIOLOGY:  No results found. ASSESSMENT AND PLAN:  Joan Erickson  is a 83 y.o. female with a known history of A. fib, hypertension, hyperlipidemia, CKD stage 4 and asthma. The patient was treated for pneumonia with antibiotics a month ago by her primary care doctor. She has been feeling increasingly weak and shortness of breath on exertion for the past few days. Started to have a palpitation today and feels cold. Her PCP told her that her heart rate was fast and sent patient to ED.   *Acute hypoxic respiratory failure with hypercapnia secondary to sedation for procedure and poor inspiratory effort Clinically improved status post hemodialysis yesterday Of BiPAP Monitor patient closely in ICU in view of bradycardia Several doses of flumazenil was given following the procedure after permacath placement on 03/17/2016.   pulmonology signed off   * Acute on chrnicA.  fib with RVR with bradycardia-6 sinus syndrome Cardizem and amiodarone drip discontinued Patient is asymptomatic  -Resume Coumadin -DVT prophylaxis with heparin subcutaneous only as recommended by cardiology - ultrasound with no evidence of DVT and upper extremity  -Pauseswere noticed while patient was on  amiodarone discontinued Continue Cardizem CD 360 mg by mouth   *SVT-resolved -Status post rapid response and adenosine  * Pneumonia - Ruled out - normal procalcitonin - stopped Abx - ID c/s as blood c/s growing GPR - likely false + Dr. Ola Spurr has recommended repeat blood cultures and if they are negative for the next 48 hours okay to the permacath placement. -  *Elevated troponin, due to  A. fib and renal failure, neg serial troponins and cardiology following, hold Coumadin.   DVT prophylaxis with heparin subcutaneous. Discussed with cardiology not recommending therapeutic Anticoagulation at this time  *Acute renal failure /Hyponatremia: Na improved from 125-131--134  nephro following.  t  Patient had permacath placement 03/16/2016   *Diabetes: sliding scale. Hold Tradjenta  *Insomnia Benadryl as needed  PT segment and home health PT  Case discussed with Care Management/Social Worker. Management plans discussed with the patient,  daughter and they are in agreement.   CODE STATUS: full  DVT Prophylaxis: heparin  TOTAL  Critical careTIME TAKING CARE OF THIS PATIENT: 20minutes.  >50% time spent on counselling and coordination of care  POSSIBLE D/C IN 2 DAYS, DEPENDING ON CLINICAL CONDITION.  Note: This dictation was prepared with Dragon dictation along with smaller phrase technology. Any transcriptional errors that result from this process are unintentional.  Nicholes Mango M.D on 02/21/2016 at 5:34 PM  Between 7am to 6pm - Pager - [phone]  After 6pm go to [email or web address] - password [email or web address] [facility]  Office  [phone]  CC: Primary care physician; Einar Pheasant, MD

## 2016-02-21 NOTE — Care Management (Signed)
Anticipate discharge with next 24 - 48 hours.  Updated Joan Erickson with [facility].  UPdated nephrology that still waiting on assignment form [facility].  Requested a chair time and return call.Marland Kitchen

## 2016-02-22 DIAGNOSIS — R001 Bradycardia, unspecified: Secondary | ICD-10-CM

## 2016-02-22 DIAGNOSIS — J45902 Unspecified asthma with status asthmaticus: Secondary | ICD-10-CM

## 2016-02-22 DIAGNOSIS — I4892 Unspecified atrial flutter: Secondary | ICD-10-CM

## 2016-02-22 LAB — CBC
HCT: 23 % — ABNORMAL LOW (ref 35.0–47.0)
Hemoglobin: 7.8 g/dL — ABNORMAL LOW (ref 12.0–16.0)
MCH: 28.4 pg (ref 26.0–34.0)
MCHC: 33.8 g/dL (ref 32.0–36.0)
MCV: 84 fL (ref 80.0–100.0)
PLATELETS: 241 10*3/uL (ref 150–440)
RBC: 2.74 MIL/uL — ABNORMAL LOW (ref 3.80–5.20)
RDW: 16.4 % — AB (ref 11.5–14.5)
WBC: 7.7 10*3/uL (ref 3.6–11.0)

## 2016-02-22 LAB — PHOSPHORUS: PHOSPHORUS: 5 mg/dL — AB (ref 2.5–4.6)

## 2016-02-22 LAB — CULTURE, BLOOD (SINGLE): Culture: NO GROWTH

## 2016-02-22 LAB — GLUCOSE, CAPILLARY
GLUCOSE-CAPILLARY: 114 mg/dL — AB (ref 65–99)
GLUCOSE-CAPILLARY: 133 mg/dL — AB (ref 65–99)
Glucose-Capillary: 140 mg/dL — ABNORMAL HIGH (ref 65–99)
Glucose-Capillary: 191 mg/dL — ABNORMAL HIGH (ref 65–99)
Glucose-Capillary: 173 mg/dL — ABNORMAL HIGH (ref 65–99)

## 2016-02-22 LAB — PROTIME-INR
INR: 1.24
PROTHROMBIN TIME: 15.7 s — AB (ref 11.4–15.2)

## 2016-02-22 MED ORDER — ARFORMOTEROL TARTRATE 15 MCG/2ML IN NEBU
15.0000 ug | INHALATION_SOLUTION | Freq: Two times a day (BID) | RESPIRATORY_TRACT | Status: DC
  Administered 2016-02-22 – 2016-03-02 (×18): 15 ug via RESPIRATORY_TRACT
  Filled 2016-02-22 (×20): qty 2

## 2016-02-22 MED ORDER — METOPROLOL TARTRATE 25 MG PO TABS: 25.0000 mg | ORAL_TABLET | Freq: Two times a day (BID) | ORAL | Status: DC

## 2016-02-22 MED ORDER — AMIODARONE HCL 200 MG PO TABS
200.0000 mg | ORAL_TABLET | Freq: Every day | ORAL | Status: DC
  Administered 2016-02-22 – 2016-02-27 (×6): 200 mg via ORAL
  Filled 2016-02-22 (×8): qty 1

## 2016-02-22 MED ORDER — ALBUTEROL SULFATE (2.5 MG/3ML) 0.083% IN NEBU
2.5000 mg | INHALATION_SOLUTION | RESPIRATORY_TRACT | Status: DC | PRN
  Administered 2016-02-23: 2.5 mg via RESPIRATORY_TRACT
  Filled 2016-02-22: qty 3

## 2016-02-22 MED ORDER — EPOETIN ALFA 10000 UNIT/ML IJ SOLN
10000.0000 [IU] | INTRAMUSCULAR | Status: DC
  Administered 2016-02-22 – 2016-02-29 (×4): 10000 [IU] via INTRAVENOUS

## 2016-02-22 MED ORDER — METOPROLOL TARTRATE 25 MG PO TABS
25.0000 mg | ORAL_TABLET | Freq: Two times a day (BID) | ORAL | Status: DC
  Administered 2016-02-22 – 2016-02-27 (×11): 25 mg via ORAL
  Filled 2016-02-22 (×12): qty 1

## 2016-02-22 MED ORDER — WARFARIN SODIUM 5 MG PO TABS
5.0000 mg | ORAL_TABLET | Freq: Once | ORAL | Status: AC
  Administered 2016-02-22: 5 mg via ORAL
  Filled 2016-02-22: qty 1

## 2016-02-22 MED ORDER — BUDESONIDE 0.25 MG/2ML IN SUSP
0.2500 mg | Freq: Two times a day (BID) | RESPIRATORY_TRACT | Status: DC
  Administered 2016-02-22 – 2016-03-02 (×19): 0.25 mg via RESPIRATORY_TRACT
  Filled 2016-02-22 (×21): qty 2

## 2016-02-22 MED ORDER — DILTIAZEM HCL ER COATED BEADS 240 MG PO CP24
240.0000 mg | ORAL_CAPSULE | Freq: Every day | ORAL | Status: DC
  Administered 2016-02-22 – 2016-02-24 (×3): 240 mg via ORAL
  Filled 2016-02-22 (×3): qty 1

## 2016-02-22 MED ORDER — TRAZODONE HCL 50 MG PO TABS
50.0000 mg | ORAL_TABLET | Freq: Every evening | ORAL | Status: DC | PRN
  Administered 2016-02-24: 50 mg via ORAL
  Filled 2016-02-22: qty 1

## 2016-02-22 NOTE — Progress Notes (Signed)
POST DIALYSIS ASSESSMENT 

## 2016-02-22 NOTE — Progress Notes (Signed)
[facility] Progress Note Patient Name: Joan Erickson DOB: 1931/12/21 MRN: [id number]   Date of Service  02/22/2016  HPI/Events of Note  AFIB/AFlutter - Ventricular rate = 100's to 130's. Currently on Cardizzem SR Q day and Metoprolol 25 mg PO BID.  eICU Interventions  Will change Metoprolol to 25 mg PO Q 12 hours, first dose now.      Intervention Category Major Interventions: Arrhythmia - evaluation and management  Sommer,Steven Cornelia Copa 02/22/2016, 5:33 PM

## 2016-02-22 NOTE — Progress Notes (Signed)
[facility] [place] NAME: Joan Erickson    MR#:  [id number]  DATE OF BIRTH:  09-24-1931  SUBJECTIVE:  Patient is Very awake and alert. Rate controlled on amiodarone and Cardizem drip Off BiPAP husband at bedside  REVIEW OF SYSTEMS:   Review of Systems  Constitutional: Negative for chills, fever and weight loss.  HENT: Negative for ear discharge, ear pain and nosebleeds.   Eyes: Negative for blurred vision, pain and discharge.  Respiratory: Negative for sputum production, shortness of breath, wheezing and stridor.   Cardiovascular: Negative for chest pain, palpitations, orthopnea and PND.  Gastrointestinal: Negative for abdominal pain, diarrhea, nausea and vomiting.  Genitourinary: Negative for frequency and urgency.  Musculoskeletal: Negative for back pain and joint pain.  Neurological: Positive for weakness. Negative for sensory change, speech change and focal weakness.  Psychiatric/Behavioral: Negative for depression and hallucinations. The patient is not nervous/anxious.    Tolerating Diet:yes  DRUG ALLERGIES:  No Known Allergies  VITALS:  Blood pressure 129/66, pulse (!) 111, temperature 98.4 F (36.9 C), temperature source Oral, resp. rate 19, height 5' (1.524 m), weight 63.1 kg (139 lb 3.2 oz), SpO2 96 %.  PHYSICAL EXAMINATION:   Physical Exam  GENERAL:  83 y.o.-year-old patient lying in the bed with no acute distress.  EYES: Pupils equal, round, reactive to light and accommodation. No scleral icterus. Extraocular muscles intact.  HEENT: Head atraumatic, normocephalic. Oropharynx and nasopharynx clear.  NECK:  Supple, no jugular venous distention. No thyroid enlargement, no tenderness.  LUNGS: Normal breath sounds bilaterally, no wheezing, rales, rhonchi. No use of accessory muscles of respiration.  CARDIOVASCULAR: Irregularly irregular No murmurs, rubs, or gallops.  ABDOMEN: Soft, nontender, nondistended. Bowel sounds  present. No organomegaly or mass.  EXTREMITIES: No bleeding noticed on the right groin area at the dialysis catheter site  No cyanosis, clubbing or edema b/l.    NEUROLOGIC: Cranial nerves II through XII are intact. No focal Motor or sensory deficits b/l.   PSYCHIATRIC:  patient is alert and oriented x 3.  SKIN: No obvious rash, lesion, or ulcer.  LABORATORY PANEL:  CBC  Recent Labs Lab 02/22/16 0617  WBC 7.7  HGB 7.8*  HCT 23.0*  PLT 241    Chemistries   Recent Labs Lab 02/18/16 0612 02/21/16 0548  NA 129* 134*  K 4.3 4.3  CL 97* 99*  CO2 24 29  GLUCOSE 180* 127*  BUN 34* 21*  CREATININE 3.86* 3.00*  CALCIUM 7.9* 7.8*  MG 2.1  --    Cardiac Enzymes  Recent Labs Lab 02/18/16 1152  TROPONINI 0.06*   RADIOLOGY:  No results found. ASSESSMENT AND PLAN:  Joan Erickson  is a 83 y.o. female with a known history of A. fib, hypertension, hyperlipidemia, CKD stage 4 and asthma. The patient was treated for pneumonia with antibiotics a month ago by her primary care doctor. She has been feeling increasingly weak and shortness of breath on exertion for the past few days. Started to have a palpitation today and feels cold. Her PCP told her that her heart rate was fast and sent patient to ED.   *Acute hypoxic respiratory failure with hypercapnia secondary to sedation for procedure and poor inspiratory effort Clinically improved status post hemodialysis  Of BiPAP Improved now, may transfer to tele. Several doses of flumazenil was given following the procedure after permacath placement on 03/17/2016.   pulmonology signed off   * Acute on chrnicA. fib with RVR  with bradycardia-6 sinus syndrome Cardizem and amiodarone drip discontinued Patient is asymptomatic  -Resume Coumadin -DVT prophylaxis with heparin subcutaneous only as recommended by cardiology - ultrasound with no evidence of DVT and upper extremity  -Pauseswere noticed while patient was on amiodarone  discontinued Continue Cardizem CD 360 mg by mouth  Oral amio as per cardio.   *SVT-resolved -Status post rapid response and adenosine  * Pneumonia - Ruled out - normal procalcitonin - stopped Abx - ID c/s as blood c/s growing GPR - likely false +  repeat cx negative.  *Elevated troponin, due to  A. fib and renal failure, neg serial troponins and cardiology following, hold Coumadin.   DVT prophylaxis with heparin subcutaneous. Discussed with cardiology not recommending therapeutic Anticoagulation at this time  *Acute renal failure /Hyponatremia: Na improved from 125-131--134  nephro following.    Patient had permacath placement 02/26/2016   *Diabetes: sliding scale. Hold Tradjenta  *Insomnia Benadryl as needed  PT segment and home health PT  Case discussed with Care Management/Social Worker. Management plans discussed with the patient,  daughter and they are in agreement.   CODE STATUS: full  DVT Prophylaxis: heparin  TOTAL  Critical careTIME TAKING CARE OF THIS PATIENT: 56minutes.  >50% time spent on counselling and coordination of care  POSSIBLE D/C IN 2 DAYS, DEPENDING ON CLINICAL CONDITION.  Note: This dictation was prepared with Dragon dictation along with smaller phrase technology. Any transcriptional errors that result from this process are unintentional.  Vaughan Basta M.D on 02/22/2016 at 4:17 PM  Between 7am to 6pm - Pager - [phone]  After 6pm go to [email or web address] - password [email or web address] [facility]  Office  [phone]  CC: Primary care physician; Einar Pheasant, MD

## 2016-02-22 NOTE — Progress Notes (Signed)
Pt's HR is beginning to rise since dialysis ended. Elink called and orders to start PO metoprolol starting now were obtained. Will administer PO metoprolol.

## 2016-02-22 NOTE — Consult Note (Signed)
PULMONARY CONSULT NOTE  Requesting MD/Service: Modena Nunnery Date of initial consultation: 02/03 Reason for consultation: hypercarbia  HPI:  80 F with history of CAF, asthma, CKD admitted 02/09/2016 with SOB and atrial flutter. Underwent temporary HD cath insertion 01/26. Transferred to ICU/SDU 02/01 with atrial flutter and rapid ventricular response. Underwent tunneled catheter placement 02/01. She was hypercarbic after that procedure and was transiently on BiPAP. Presently comfortable on [place]ica o2. No complaints of dyspnea, chest pain. RN reports episode of bradycardia last PM and amiodarone infusion was stopped  Past Medical History:  Diagnosis Date  . Asthma   . Atrial fibrillation ([place])   . GERD (gastroesophageal reflux disease)   . History of colon polyps   . Hypercholesterolemia   . Hypertension   . Hypogammaglobulinemia (H[facility])    IgA  . Warfarin anticoagulation   . Xanthogranulomatous pyelonephritis    s/p right nephrectomy    Past Surgical History:  Procedure Laterality Date  . ABDOMINAL HYSTERECTOMY     ovaries not removed  . CESAREAN SECTION    . ELECTROPHYSIOLOGIC STUDY N/A 02/12/2016   Procedure: CARDIOVERSION;  Surgeon: Corey Skains, MD;  Location: [facility] ORS;  Service: Cardiovascular;  Laterality: N/A;  . NEPHRECTOMY     right - for xanthogranulomatous pyelonephritis  . PERIPHERAL VASCULAR CATHETERIZATION N/A 01/27/2016   Procedure: Dialysis/Perma Catheter Insertion;  Surgeon: Katha Cabal, MD;  Location: [facility];  Service: Cardiovascular;  Laterality: N/A;  . PERIPHERAL VASCULAR CATHETERIZATION N/A 02/25/2016   Procedure: Dialysis/Perma Catheter Insertion;  Surgeon: Algernon Huxley, MD;  Location: [facility];  Service: Cardiovascular;  Laterality: N/A;    MEDICATIONS: I have reviewed all medications and confirmed regimen as documented  Social History   Social History  . Marital status: Married    Spouse name: N/A  . Number of children: 1  .  Years of education: N/A   Occupational History  . Not on file.   Social History Main Topics  . Smoking status: Never Smoker  . Smokeless tobacco: Never Used  . Alcohol use No  . Drug use: No  . Sexual activity: Not on file   Other Topics Concern  . Not on file   Social History Narrative  . No narrative on file    Family History  Problem Relation Age of Onset  . Brain cancer Mother   . Liver cancer Father     questionabl colon cancer  . Breast cancer Neg Hx     ROS: No fever, myalgias/arthralgias, unexplained weight loss or weight gain No new focal weakness or sensory deficits No otalgia, hearing loss, visual changes, nasal and sinus symptoms, mouth and throat problems No neck pain or adenopathy No abdominal pain, N/V/D, diarrhea, change in bowel pattern No dysuria, change in urinary pattern   Vitals:   02/22/16 1345 02/22/16 1400 02/22/16 1430 02/22/16 1445  BP: 122/67 130/61 138/67 (!) 120/101  Pulse: 85 (!) 133 (!) 113 96  Resp: 18 20 16  (!) 21  Temp:      TempSrc:      SpO2:      Weight:      Height:         EXAM:  Gen: Frail, No overt respiratory distress, cognition intact, occasional rattling cough HEENT: NCAT, sclera white, oropharynx normal Neck: Supple without LAN, thyromegaly, JVD Lungs: scattered wheezes, few rhonchi Cardiovascular: IR, no murmurs noted. Variable flutter on monitor Abdomen: Soft, nontender, normal BS Ext: 1-2+ symmetric ankle edema Neuro: CNs grossly  intact, motor and sensory intact Skin: Limited exam, no lesions noted  DATA:   BMP Latest Ref Rng & Units 02/21/2016 02/18/2016 02/18/2016  Glucose 65 - 99 mg/dL 127(H) 180(H) 219(H)  BUN 6 - 20 mg/dL 21(H) 34(H) 31(H)  Creatinine 0.44 - 1.00 mg/dL 3.00(H) 3.86(H) 3.78(H)  Sodium 135 - 145 mmol/L 134(L) 129(L) 128(L)  Potassium 3.5 - 5.1 mmol/L 4.3 4.3 4.4  Chloride 101 - 111 mmol/L 99(L) 97(L) 95(L)  CO2 22 - 32 mmol/L 29 24 29   Calcium 8.9 - 10.3 mg/dL 7.8(L) 7.9(L) 8.0(L)     CBC Latest Ref Rng & Units 02/22/2016 02/21/2016 03/17/2016  WBC 3.6 - 11.0 K/uL 7.7 6.4 5.5  Hemoglobin 12.0 - 16.0 g/dL 7.8(L) 8.0(L) 7.4(L)  Hematocrit 35.0 - 47.0 % 23.0(L) 23.1(L) 22.0(L)  Platelets 150 - 440 K/uL 241 267 267    CXR (01/30):  Edema pattern  IMPRESSION:   1) Acute hypercarbic respiratory failure due to sedation administered during procedure 02/01  Appears resolved 2) History of asthma with wheezing present 3) Atrial flutter with variable block  4) Bradycardia last PM - resolved after discontinuation of amiodarone infulsion 5) ESRD  PLAN:  1) Nebulized budesonide and arformoterol ordered 2) PRN albuterol ordered 3) Amiodarone 200 mg daily ordered 4) diltiazem and metoprolol doses reduced 5) Recheck CXR in AM 02/04 6) Agree with transfer to telemetry floor 7) PCCM will sign off. Please call if we can be of further assistance   Merton Border, MD PCCM service Mobile [phone] Pager [phone] 02/22/2016

## 2016-02-22 NOTE — Progress Notes (Signed)
ANTICOAGULATION CONSULT NOTE - Follow Up Consult  Pharmacy Consult for warfarin  Indication: atrial fibrillation   No Known Allergies  Patient Measurements: Height: 5' (152.4 cm) Weight: 139 lb 3.2 oz (63.1 kg) IBW/kg (Calculated) : 45.5  Vital Signs: Temp: 98.5 F (36.9 C) (02/03 1206) Temp Source: Oral (02/03 1206) BP: 139/54 (02/03 1200) Pulse Rate: 76 (02/03 1200)  Labs:  Recent Labs  03/06/2016 0512 02/21/16 0548 02/22/16 0617  HGB 7.4* 8.0* 7.8*  HCT 22.0* 23.1* 23.0*  PLT 267 267 241  LABPROT 17.8* 16.2* 15.7*  INR 1.45 1.29 1.24  CREATININE  --  3.00*  --     Estimated Creatinine Clearance: 12 mL/min (by C-G formula based on SCr of 3 mg/dL (H)).   Medical History: Past Medical History:  Diagnosis Date  . Asthma   . Atrial fibrillation ([place])   . GERD (gastroesophageal reflux disease)   . History of colon polyps   . Hypercholesterolemia   . Hypertension   . Hypogammaglobulinemia (H[facility])    IgA  . Warfarin anticoagulation   . Xanthogranulomatous pyelonephritis    s/p right nephrectomy    Medications:  Prescriptions Prior to Admission  Medication Sig Dispense Refill Last Dose  . acetaminophen (TYLENOL) 500 MG tablet Take 500 mg by mouth every 6 (six) hours as needed.   prn at prn  . ADVAIR DISKUS 250-50 MCG/DOSE AEPB INHALE 1 PUFF BY MOUTH TWICE A DAY 60 each 5 02/10/2016 at 2100  . chlorthalidone (HYGROTON) 25 MG tablet Take 0.5 tablets (12.5 mg total) by mouth daily. 30 tablet 2 02/09/2016 at 0900  . Cholecalciferol (VITAMIN D) 2000 UNITS tablet Take 2,000 Units by mouth daily.   02/10/2016 at 0900  . citalopram (CELEXA) 10 MG tablet Take 1 tablet (10 mg total) by mouth daily. 90 tablet 1 02/07/2016 at 0900  . diltiazem (TIAZAC) 360 MG 24 hr capsule Take 1 capsule (360 mg total) by mouth daily. 90 capsule 1 01/21/2016 at 0900  . finasteride (PROSCAR) 5 MG tablet    02/10/2016 at 0900  . fluticasone (FLONASE) 50 MCG/ACT nasal spray USE 2 SPRAYS IN EACH  NOSTRIL ONCE A DAY 16 g 5 02/10/2016 at Unknown time  . montelukast (SINGULAIR) 10 MG tablet Take 1 tablet (10 mg total) by mouth daily. 90 tablet 1 02/09/2016 at 0900  . pravastatin (PRAVACHOL) 20 MG tablet TAKE 1 TABLET (20 MG TOTAL) BY MOUTH DAILY. 30 tablet 8 02/10/2016 at 2100  . PROAIR HFA 108 (90 Base) MCG/ACT inhaler INHALE 2 PUFF EVERY SIX HOURS AS NEEDED 8.5 Inhaler 2 02/10/2016 at 0900  . TRADJENTA 5 MG TABS tablet TAKE 1 TABLET (5 MG TOTAL) BY MOUTH DAILY. 30 tablet 11 01/22/2016 at 0900  . warfarin (COUMADIN) 1 MG tablet Take 1 mg by mouth at bedtime. Pt takes 0.5 t in addition to the 5 mg dose  4 02/10/2016 at 2100  . warfarin (COUMADIN) 5 MG tablet Take 5 mg by mouth at bedtime. Pt takes 5 mg dose along with 0.5 of a 1 mg tab qhs   02/10/2016 at 2100  . Blood Glucose Monitoring Suppl (ONE TOUCH ULTRA SYSTEM KIT) W/DEVICE KIT 1 kit by Does not apply route once. 1 each 0 Taking  . eplerenone (INSPRA) 25 MG tablet TAKE 1 TABLET (25 MG TOTAL) BY MOUTH ONCE DAILY FOR 14 DAYS.  0 Taking  . fluticasone (FLONASE) 50 MCG/ACT nasal spray USE 2 SPRAYS IN EACH NOSTRIL ONCE A DAY 16 g 1 Taking  .  ONE TOUCH ULTRA TEST test strip CHECK BLOOD SUGAR 2 TIMES A DAY DX: E11.9 100 each 3 Taking   Scheduled:  . amiodarone  200 mg Oral Daily  . arformoterol  15 mcg Nebulization BID  . budesonide (PULMICORT) nebulizer solution  0.25 mg Nebulization BID  . diltiazem  240 mg Oral Daily  . epoetin (EPOGEN/PROCRIT) injection  10,000 Units Intravenous Q T,Th,Sa-HD  . feeding supplement (ENSURE ENLIVE)  237 mL Oral BID BM  . heparin subcutaneous  5,000 Units Subcutaneous Q8H  . insulin aspart  0-5 Units Subcutaneous QHS  . insulin aspart  0-9 Units Subcutaneous TID WC  . metoprolol tartrate  25 mg Oral BID  . pravastatin  20 mg Oral q1800  . sodium chloride flush  3 mL Intravenous Q12H  . Warfarin - Pharmacist Dosing Inpatient   Does not apply q1800    Assessment: Pharmacy consulted to dose and monitor  warfarin therapy in this 83 year old woman with atrial fibrillation. Warfarin was held since 1/29 and patient is now s/p permcath placement. Home warfarin dose is 5.4m  daily.   2/2  INR   1.29   Warfarin 5 mg 2/3  INR   1.24    Goal of Therapy:  INR 2-3 Monitor platelets by anticoagulation protocol: Yes   Plan:  INR subtherapeutic at 1.29. Patient had warfarin held since the 1/28. Will start patient with a load dose of warfarin 517mtoday. Suspect that warfarin will need to be decreased from home dose as patient started on amiodarone in the hospital. Patient is currently receiving DVT ppx with heparin at this time till INR comes up.   2/3:  Will give Warfarin 5 mg again tonight. F/u INR in am.   MeNoralee SpacePharmD  02/22/2016 12:07 PM

## 2016-02-22 NOTE — Progress Notes (Signed)
HD STARTED  

## 2016-02-22 NOTE — Progress Notes (Signed)
HD COMPLETED  

## 2016-02-22 NOTE — Progress Notes (Signed)
Subjective: Patient due for hemodialysis today. Orders have been prepared. Still in atrial fibrillation however heart rate in control.   Weight trends: Filed Weights   02/16/16 1728 02/19/16 0955 02/19/16 1416  Weight: 63.1 kg (139 lb 1.8 oz) 63.6 kg (140 lb 3.4 oz) 63.1 kg (139 lb 3.2 oz)    Physical Exam: General: No acute distress  HEENT Anicteric, moist oral mucous membranes  Neck: supple  Lungs: Mild basilar rales, normal effort  Heart:: irregular no rubs  Abdomen: Soft, nontender, nondistended  Extremities: 1+ plus lower extremity edema  Neurologic: Awake, alert, following commands  Skin: Warm, dry, normal turgor  Access: Right femoral temp cath, R IJ permcath as well          Lab results: Basic Metabolic Panel:  Recent Labs Lab 02/18/16 0102 02/18/16 0612 02/18/16 1152 02/19/16 0513 02/21/16 0548 02/22/16 0617  NA 128* 129*  --   --  134*  --   K 4.4 4.3  --   --  4.3  --   CL 95* 97*  --   --  99*  --   CO2 29 24  --   --  29  --   GLUCOSE 219* 180*  --   --  127*  --   BUN 31* 34*  --   --  21*  --   CREATININE 3.78* 3.86*  --   --  3.00*  --   CALCIUM 8.0* 7.9*  --   --  7.8*  --   MG  --  2.1  --   --   --   --   PHOS  --   --  5.9* 4.7*  --  5.0*    Liver Function Tests: No results for input(s): AST, ALT, ALKPHOS, BILITOT, PROT, ALBUMIN in the last 168 hours. No results for input(s): LIPASE, AMYLASE in the last 168 hours. No results for input(s): AMMONIA in the last 168 hours.  CBC:  Recent Labs Lab 02/18/16 0102  02/21/16 0548 02/22/16 0617  WBC 9.2  < > 6.4 7.7  NEUTROABS 8.3*  --   --   --   HGB 8.5*  < > 8.0* 7.8*  HCT 25.1*  < > 23.1* 23.0*  MCV 83.9  < > 82.9 84.0  PLT 368  < > 267 241  < > = values in this interval not displayed.  Cardiac Enzymes:  Recent Labs Lab 02/18/16 1152  TROPONINI 0.06*    BNP: Invalid input(s): POCBNP  CBG:  Recent Labs Lab 02/21/16 0723 02/21/16 1202 02/21/16 1643 02/21/16 2146  02/22/16 1000  GLUCAP 124* 243* 176* 190* 140*    Microbiology: Recent Results (from the past 720 hour(s))  Blood culture (routine x 2)     Status: Abnormal   Collection Time: 01/26/2016 12:31 PM  Result Value Ref Range Status   Specimen Description BLOOD RIGHT AC  Final   Special Requests   Final    BOTTLES DRAWN AEROBIC AND ANAEROBIC AER 11ML ANA 8ML   Culture  Setup Time   Final    GRAM POSITIVE COCCI AEROBIC BOTTLE ONLY CRITICAL RESULT CALLED TO, READ BACK BY AND VERIFIED WITH:  KAREN HAYES AT [id number] 01/20/2016 SDR    Culture (A)  Final    FACKLAMIA HOMINIS Standardized susceptibility testing for this organism is not available. Performed at [facility], [place] [address] [place],  [address]    Report Status 02/17/2016 FINAL  Final  Blood Culture ID Panel (  Reflexed)     Status: None   Collection Time: 02/07/2016 12:31 PM  Result Value Ref Range Status   Enterococcus species NOT DETECTED NOT DETECTED Final   Listeria monocytogenes NOT DETECTED NOT DETECTED Final   Staphylococcus species NOT DETECTED NOT DETECTED Final   Staphylococcus aureus NOT DETECTED NOT DETECTED Final   Streptococcus species NOT DETECTED NOT DETECTED Final   Streptococcus agalactiae NOT DETECTED NOT DETECTED Final   Streptococcus pneumoniae NOT DETECTED NOT DETECTED Final   Streptococcus pyogenes NOT DETECTED NOT DETECTED Final   Acinetobacter baumannii NOT DETECTED NOT DETECTED Final   Enterobacteriaceae species NOT DETECTED NOT DETECTED Final   Enterobacter cloacae complex NOT DETECTED NOT DETECTED Final   Escherichia coli NOT DETECTED NOT DETECTED Final   Klebsiella oxytoca NOT DETECTED NOT DETECTED Final   Klebsiella pneumoniae NOT DETECTED NOT DETECTED Final   Proteus species NOT DETECTED NOT DETECTED Final   Serratia marcescens NOT DETECTED NOT DETECTED Final   Haemophilus influenzae NOT DETECTED NOT DETECTED Final   Neisseria meningitidis NOT DETECTED NOT DETECTED Final   Pseudomonas  aeruginosa NOT DETECTED NOT DETECTED Final   Candida albicans NOT DETECTED NOT DETECTED Final   Candida glabrata NOT DETECTED NOT DETECTED Final   Candida krusei NOT DETECTED NOT DETECTED Final   Candida parapsilosis NOT DETECTED NOT DETECTED Final   Candida tropicalis NOT DETECTED NOT DETECTED Final  Blood culture (routine x 2)     Status: None   Collection Time: 02/15/2016 12:32 PM  Result Value Ref Range Status   Specimen Description BLOOD LEFT HAND  Final   Special Requests   Final    BOTTLES DRAWN AEROBIC AND ANAEROBIC AER 13ML ANA 13ML   Culture NO GROWTH 5 DAYS  Final   Report Status 02/16/2016 FINAL  Final  MRSA PCR Screening     Status: None   Collection Time: 02/10/2016 10:32 PM  Result Value Ref Range Status   MRSA by PCR NEGATIVE NEGATIVE Final    Comment:        The GeneXpert MRSA Assay (FDA approved for NASAL specimens only), is one component of a comprehensive MRSA colonization surveillance program. It is not intended to diagnose MRSA infection nor to guide or monitor treatment for MRSA infections.   Culture, expectorated sputum-assessment     Status: None   Collection Time: 01/21/2016  3:24 PM  Result Value Ref Range Status   Specimen Description SPUTUM  Final   Special Requests Normal  Final   Sputum evaluation   Final    Sputum specimen not acceptable for testing.  Please recollect.   RESULT CALLED TO, READ BACK BY AND VERIFIED WITH: Champ Mungo 02/04/2016 1608 KLW    Report Status 02/12/2016 FINAL  Final  Culture, blood (single) w Reflex to ID Panel     Status: None   Collection Time: 02/17/16  2:45 PM  Result Value Ref Range Status   Specimen Description BLOOD RIGHT ANTECUBITAL  Final   Special Requests   Final    BOTTLES DRAWN AEROBIC AND ANAEROBIC ANA 8CC AER [place]   Culture NO GROWTH 5 DAYS  Final   Report Status 02/22/2016 FINAL  Final  Culture, blood (Routine X 2) w Reflex to ID Panel     Status: None (Preliminary result)   Collection Time: 02/18/16   6:12 AM  Result Value Ref Range Status   Specimen Description BLOOD R HAND  Final   Special Requests BOTTLES DRAWN AEROBIC AND ANAEROBIC 1ML  Final  Culture NO GROWTH 4 DAYS  Final   Report Status PENDING  Incomplete  Culture, blood (Routine X 2) w Reflex to ID Panel     Status: None (Preliminary result)   Collection Time: 02/18/16  6:12 AM  Result Value Ref Range Status   Specimen Description BLOOD LEFT HAND  Final   Special Requests BOTTLES DRAWN AEROBIC AND ANAEROBIC 4ML  Final   Culture NO GROWTH 4 DAYS  Final   Report Status PENDING  Incomplete     Coagulation Studies:  Recent Labs  02/24/2016 0512 02/21/16 0548 02/22/16 0617  LABPROT 17.8* 16.2* 15.7*  INR 1.45 1.29 1.24    Urinalysis: No results for input(s): COLORURINE, LABSPEC, PHURINE, GLUCOSEU, HGBUR, BILIRUBINUR, KETONESUR, PROTEINUR, UROBILINOGEN, NITRITE, LEUKOCYTESUR in the last 72 hours.  Invalid input(s): APPERANCEUR      Imaging: No results found.   Assessment & Plan: Pt is a 83 y.o. 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 ESRD/ [facility] Started with HD  - Patient due for hemodialysis today. Orders have been prepared.  2. Shortness of breath, pulm edema Clinically improved. We will continue ultrafiltration with dialysis.   3.  Lower extremity edema - Still has significant edema however some of this may be nutritional. Continue to monitor and perform ultrafiltration with dialysis.  4. Hyperkalemia and hyponatremia Most recent serum potassium was 4.3 and is acceptable. Continue to monitor.  5.  Anemia of CKD:  Hemoglobin currently 7.8.  We will start Epogen 10,000 units IV with dialysis.

## 2016-02-22 NOTE — Progress Notes (Signed)
Pt transferring to unit 2A today. Pt/family is aware and agreeable to transfer. Assessment is unchanged from this morning and receiving RN has been given report. Belongings sent with pt at bedside.  

## 2016-02-22 NOTE — Progress Notes (Signed)
PRE DIALYSIS ASSESSMENT 

## 2016-02-23 ENCOUNTER — Inpatient Hospital Stay: Payer: Medicare Other

## 2016-02-23 LAB — CBC
HEMATOCRIT: 24.2 % — AB (ref 35.0–47.0)
Hemoglobin: 8.1 g/dL — ABNORMAL LOW (ref 12.0–16.0)
MCH: 27.9 pg (ref 26.0–34.0)
MCHC: 33.6 g/dL (ref 32.0–36.0)
MCV: 83 fL (ref 80.0–100.0)
PLATELETS: 254 10*3/uL (ref 150–440)
RBC: 2.91 MIL/uL — ABNORMAL LOW (ref 3.80–5.20)
RDW: 15.9 % — AB (ref 11.5–14.5)
WBC: 9.2 10*3/uL (ref 3.6–11.0)

## 2016-02-23 LAB — BASIC METABOLIC PANEL
Anion gap: 6 (ref 5–15)
BUN: 22 mg/dL — AB (ref 6–20)
CO2: 31 mmol/L (ref 22–32)
CREATININE: 3.34 mg/dL — AB (ref 0.44–1.00)
Calcium: 7.8 mg/dL — ABNORMAL LOW (ref 8.9–10.3)
Chloride: 95 mmol/L — ABNORMAL LOW (ref 101–111)
GFR calc Af Amer: 14 mL/min — ABNORMAL LOW (ref >60)
GFR, EST NON AFRICAN AMERICAN: 12 mL/min — AB (ref >60)
GLUCOSE: 146 mg/dL — AB (ref 65–99)
Potassium: 3.9 mmol/L (ref 3.5–5.1)
Sodium: 132 mmol/L — ABNORMAL LOW (ref 135–145)

## 2016-02-23 LAB — GLUCOSE, CAPILLARY
GLUCOSE-CAPILLARY: 158 mg/dL — AB (ref 65–99)
Glucose-Capillary: 167 mg/dL — ABNORMAL HIGH (ref 65–99)
Glucose-Capillary: 231 mg/dL — ABNORMAL HIGH (ref 65–99)
Glucose-Capillary: 181 mg/dL — ABNORMAL HIGH (ref 65–99)

## 2016-02-23 LAB — CULTURE, BLOOD (ROUTINE X 2)
Culture: NO GROWTH
Culture: NO GROWTH

## 2016-02-23 LAB — PROTIME-INR
INR: 1.43
PROTHROMBIN TIME: 17.6 s — AB (ref 11.4–15.2)

## 2016-02-23 MED ORDER — WARFARIN SODIUM 5 MG PO TABS
5.0000 mg | ORAL_TABLET | Freq: Once | ORAL | Status: AC
  Administered 2016-02-23: 5 mg via ORAL
  Filled 2016-02-23: qty 1

## 2016-02-23 NOTE — Progress Notes (Signed)
Subjective: Patient had hemodialysis yesterday. She tolerated this well. Ultrafiltration achieved was 1.7 kg.    Weight trends: Filed Weights   02/16/16 1728 02/19/16 0955 02/19/16 1416  Weight: 63.1 kg (139 lb 1.8 oz) 63.6 kg (140 lb 3.4 oz) 63.1 kg (139 lb 3.2 oz)   Blood pressure (!) 159/67, pulse 96, temperature 97.6 F (36.4 C), temperature source Oral, resp. rate 20, height 5' (1.524 m), weight 63.1 kg (139 lb 3.2 oz), SpO2 97 %.  Physical Exam: General: No acute distress  HEENT Anicteric, moist oral mucous membranes  Neck: supple  Lungs: Mild basilar rales, normal effort  Heart:: irregular no rubs  Abdomen: Soft, nontender, nondistended  Extremities: 1+ plus lower extremity edema  Neurologic: Awake, alert, following commands  Skin: Warm, dry, normal turgor  Access: R IJ permcath          Lab results: Basic Metabolic Panel:  Recent Labs Lab 02/18/16 0612 02/18/16 1152 02/19/16 0513 02/21/16 0548 02/22/16 0617 02/23/16 0602  NA 129*  --   --  134*  --  132*  K 4.3  --   --  4.3  --  3.9  CL 97*  --   --  99*  --  95*  CO2 24  --   --  29  --  31  GLUCOSE 180*  --   --  127*  --  146*  BUN 34*  --   --  21*  --  22*  CREATININE 3.86*  --   --  3.00*  --  3.34*  CALCIUM 7.9*  --   --  7.8*  --  7.8*  MG 2.1  --   --   --   --   --   PHOS  --  5.9* 4.7*  --  5.0*  --     Liver Function Tests: No results for input(s): AST, ALT, ALKPHOS, BILITOT, PROT, ALBUMIN in the last 168 hours. No results for input(s): LIPASE, AMYLASE in the last 168 hours. No results for input(s): AMMONIA in the last 168 hours.  CBC:  Recent Labs Lab 02/18/16 0102  02/22/16 0617 02/23/16 0602  WBC 9.2  < > 7.7 9.2  NEUTROABS 8.3*  --   --   --   HGB 8.5*  < > 7.8* 8.1*  HCT 25.1*  < > 23.0* 24.2*  MCV 83.9  < > 84.0 83.0  PLT 368  < > 241 254  < > = values in this interval not displayed.  Cardiac Enzymes:  Recent Labs Lab 02/18/16 1152  TROPONINI 0.06*     BNP: Invalid input(s): POCBNP  CBG:  Recent Labs Lab 02/22/16 1301 02/22/16 1555 02/22/16 2048 02/23/16 0721 02/23/16 1142  GLUCAP 173* 114* 133* 167* 231*    Microbiology: Recent Results (from the past 720 hour(s))  Blood culture (routine x 2)     Status: Abnormal   Collection Time: 02/14/2016 12:31 PM  Result Value Ref Range Status   Specimen Description BLOOD RIGHT AC  Final   Special Requests   Final    BOTTLES DRAWN AEROBIC AND ANAEROBIC AER 11ML ANA 8ML   Culture  Setup Time   Final    GRAM POSITIVE COCCI AEROBIC BOTTLE ONLY CRITICAL RESULT CALLED TO, READ BACK BY AND VERIFIED WITH:  [place] AT [id number] 02/02/2016 SDR    Culture (A)  Final    FACKLAMIA HOMINIS Standardized susceptibility testing for this organism is not available. Performed at [facility],  [address], [place], [place] [address]    Report Status 02/17/2016 FINAL  Final  Blood Culture ID Panel (Reflexed)     Status: None   Collection Time: 02/03/2016 12:31 PM  Result Value Ref Range Status   Enterococcus species NOT DETECTED NOT DETECTED Final   Listeria monocytogenes NOT DETECTED NOT DETECTED Final   Staphylococcus species NOT DETECTED NOT DETECTED Final   Staphylococcus aureus NOT DETECTED NOT DETECTED Final   Streptococcus species NOT DETECTED NOT DETECTED Final   Streptococcus agalactiae NOT DETECTED NOT DETECTED Final   Streptococcus pneumoniae NOT DETECTED NOT DETECTED Final   Streptococcus pyogenes NOT DETECTED NOT DETECTED Final   Acinetobacter baumannii NOT DETECTED NOT DETECTED Final   Enterobacteriaceae species NOT DETECTED NOT DETECTED Final   Enterobacter cloacae complex NOT DETECTED NOT DETECTED Final   Escherichia coli NOT DETECTED NOT DETECTED Final   Klebsiella oxytoca NOT DETECTED NOT DETECTED Final   Klebsiella pneumoniae NOT DETECTED NOT DETECTED Final   Proteus species NOT DETECTED NOT DETECTED Final   Serratia marcescens NOT DETECTED NOT DETECTED Final    Haemophilus influenzae NOT DETECTED NOT DETECTED Final   Neisseria meningitidis NOT DETECTED NOT DETECTED Final   Pseudomonas aeruginosa NOT DETECTED NOT DETECTED Final   Candida albicans NOT DETECTED NOT DETECTED Final   Candida glabrata NOT DETECTED NOT DETECTED Final   Candida krusei NOT DETECTED NOT DETECTED Final   Candida parapsilosis NOT DETECTED NOT DETECTED Final   Candida tropicalis NOT DETECTED NOT DETECTED Final  Blood culture (routine x 2)     Status: None   Collection Time: 02/09/2016 12:32 PM  Result Value Ref Range Status   Specimen Description BLOOD LEFT HAND  Final   Special Requests   Final    BOTTLES DRAWN AEROBIC AND ANAEROBIC AER 13ML ANA 13ML   Culture NO GROWTH 5 DAYS  Final   Report Status 02/16/2016 FINAL  Final  MRSA PCR Screening     Status: None   Collection Time: 02/12/2016 10:32 PM  Result Value Ref Range Status   MRSA by PCR NEGATIVE NEGATIVE Final    Comment:        The GeneXpert MRSA Assay (FDA approved for NASAL specimens only), is one component of a comprehensive MRSA colonization surveillance program. It is not intended to diagnose MRSA infection nor to guide or monitor treatment for MRSA infections.   Culture, expectorated sputum-assessment     Status: None   Collection Time: 01/30/2016  3:24 PM  Result Value Ref Range Status   Specimen Description SPUTUM  Final   Special Requests Normal  Final   Sputum evaluation   Final    Sputum specimen not acceptable for testing.  Please recollect.   RESULT CALLED TO, READ BACK BY AND VERIFIED WITH: Champ Mungo 02/12/2016 1608 KLW    Report Status 02/07/2016 FINAL  Final  Culture, blood (single) w Reflex to ID Panel     Status: None   Collection Time: 02/17/16  2:45 PM  Result Value Ref Range Status   Specimen Description BLOOD RIGHT ANTECUBITAL  Final   Special Requests   Final    BOTTLES DRAWN AEROBIC AND ANAEROBIC ANA 8CC AER [place]   Culture NO GROWTH 5 DAYS  Final   Report Status 02/22/2016  FINAL  Final  Culture, blood (Routine X 2) w Reflex to ID Panel     Status: None   Collection Time: 02/18/16  6:12 AM  Result Value Ref Range Status   Specimen  Description BLOOD R HAND  Final   Special Requests BOTTLES DRAWN AEROBIC AND ANAEROBIC 1ML  Final   Culture NO GROWTH 5 DAYS  Final   Report Status 02/23/2016 FINAL  Final  Culture, blood (Routine X 2) w Reflex to ID Panel     Status: None   Collection Time: 02/18/16  6:12 AM  Result Value Ref Range Status   Specimen Description BLOOD LEFT HAND  Final   Special Requests BOTTLES DRAWN AEROBIC AND ANAEROBIC 4ML  Final   Culture NO GROWTH 5 DAYS  Final   Report Status 02/23/2016 FINAL  Final     Coagulation Studies:  Recent Labs  02/21/16 0548 02/22/16 0617 02/23/16 0602  LABPROT 16.2* 15.7* 17.6*  INR 1.29 1.24 1.43    Urinalysis: No results for input(s): COLORURINE, LABSPEC, PHURINE, GLUCOSEU, HGBUR, BILIRUBINUR, KETONESUR, PROTEINUR, UROBILINOGEN, NITRITE, LEUKOCYTESUR in the last 72 hours.  Invalid input(s): APPERANCEUR      Imaging: Dg Chest Port 1 View  Result Date: 02/23/2016 CLINICAL DATA:  83 year old female with shortness of breath. Respiratory failure. Acute renal failure. Initial encounter. EXAM: PORTABLE CHEST 1 VIEW COMPARISON:  02/18/2016 and earlier. FINDINGS: Portable AP upright view at at 0536 hours. A right IJ approach dual lumen dialysis type catheter has been placed. No pneumothorax. Stable cardiomegaly and mediastinal contours. Continued veiling and confluent bibasilar opacity. Stable pulmonary vascularity without overt edema. IMPRESSION: 1. Right IJ dual-lumen catheter placed.  No pneumothorax. 2. Bilateral pleural effusions and lower lobe collapse or consolidation. Electronically Signed   By: Genevie Ann M.D.   On: 02/23/2016 07:28     Assessment & Plan: Pt is a 83 y.o. 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 ESRD/ [facility] Started with HD  - Patient completed hemodialysis yesterday. Outpatient dialysis has been set at [facility] under the care of [facility] for [facility] 2nd shift.  No acute indication for dialysis at the moment, we will plan for dialysis again on Tuesday if still here.  2. Shortness of breath, pulm edema - Significantly improved from admission. Continue ultrafiltration with dialysis.   3.  Lower extremity edema - Patient will need monitoring of her estimated dry weight as an outpatient and reevaluation of her edema status.  4. Hyperkalemia and hyponatremia Serum sodium 132 and potassium 3.9 at last check. Continue to monitor. Improved with dialysis.  5.  Anemia of CKD:  Hemoglobin currently 8.1.  Continue Epogen with dialysis.  6. Secondary hyperparathyroidism. Phosphorus 5.0 at last check. Continue to monitor bone mineral metabolism parameters as an outpatient.

## 2016-02-23 NOTE — Progress Notes (Signed)
The pastor of her church came and got me, telling me the patient and her husband had many questions about where things were in treatment.  I thanked him profusely for intervening  as they weren't bring up the questions themselves.    They had questions about which meds she would go home on - I reviewed her current meds and the purpose of each.  Told them I wouldn't definitely know until the time of discharge.  But for the most part she is responding well with these meds.    They have concerns about what to do at home if her heart beat increases- told them we hope to have her in controlled A fib on discharge.  She will see her cardiology more frequently after discharge and med adjustments might be needed.  She takes her BP and P every morning,  Instructed her to call the doctor is her heart rate is consistently above 110.  There are likely treatments that can keep her out of the hospital.  They wanted to know how long she would have the perm cath - told them I don't know the time frame but she would likely have a shunt placed at some point and the cath would be removed.  I spent about 30 minutes talking with them, reassuring them, and answering their questions. I will review this again tomorrow.

## 2016-02-23 NOTE — Progress Notes (Signed)
Joan Erickson at [place] NAME: Joan Erickson    MR#:  [id number]  DATE OF BIRTH:  Aug 02, 1931  SUBJECTIVE:  Patient is Very awake and alert. Rate controlled on amiodarone and Cardizem drip Off BiPAP husband at bedside Pt is feeling comfortable, have tachycardia- on - off since last evening.  REVIEW OF SYSTEMS:   Review of Systems  Constitutional: Negative for chills, fever and weight loss.  HENT: Negative for ear discharge, ear pain and nosebleeds.   Eyes: Negative for blurred vision, pain and discharge.  Respiratory: Negative for sputum production, shortness of breath, wheezing and stridor.   Cardiovascular: Negative for chest pain, palpitations, orthopnea and PND.  Gastrointestinal: Negative for abdominal pain, diarrhea, nausea and vomiting.  Genitourinary: Negative for frequency and urgency.  Musculoskeletal: Negative for back pain and joint pain.  Neurological: Positive for weakness. Negative for sensory change, speech change and focal weakness.  Psychiatric/Behavioral: Negative for depression and hallucinations. The patient is not nervous/anxious.    Tolerating Diet:yes  DRUG ALLERGIES:  No Known Allergies  VITALS:  Blood pressure (!) 159/67, pulse 96, temperature 97.6 F (36.4 C), temperature source Oral, resp. rate 20, height 5' (1.524 m), weight 63.1 kg (139 lb 3.2 oz), SpO2 97 %.  PHYSICAL EXAMINATION:   Physical Exam  GENERAL:  83 y.o.-year-old patient lying in the bed with no acute distress.  EYES: Pupils equal, round, reactive to light and accommodation. No scleral icterus. Extraocular muscles intact.  HEENT: Head atraumatic, normocephalic. Oropharynx and nasopharynx clear.  NECK:  Supple, no jugular venous distention. No thyroid enlargement, no tenderness.  LUNGS: Normal breath sounds bilaterally, no wheezing, rales, rhonchi. No use of accessory muscles of respiration.  CARDIOVASCULAR: Irregularly irregular No murmurs,  rubs, or gallops.  ABDOMEN: Soft, nontender, nondistended. Bowel sounds present. No organomegaly or mass.  EXTREMITIES: No bleeding noticed on the right groin area at the dialysis catheter site  No cyanosis, clubbing or edema b/l.    NEUROLOGIC: Cranial nerves II through XII are intact. No focal Motor or sensory deficits b/l.   PSYCHIATRIC:  patient is alert and oriented x 3.  SKIN: No obvious rash, lesion, or ulcer.  LABORATORY PANEL:  CBC  Recent Labs Lab 02/23/16 0602  WBC 9.2  HGB 8.1*  HCT 24.2*  PLT 254    Chemistries   Recent Labs Lab 02/18/16 0612  02/23/16 0602  NA 129*  < > 132*  K 4.3  < > 3.9  CL 97*  < > 95*  CO2 24  < > 31  GLUCOSE 180*  < > 146*  BUN 34*  < > 22*  CREATININE 3.86*  < > 3.34*  CALCIUM 7.9*  < > 7.8*  MG 2.1  --   --   < > = values in this interval not displayed. Cardiac Enzymes  Recent Labs Lab 02/18/16 1152  TROPONINI 0.06*   RADIOLOGY:  Dg Chest Port 1 View  Result Date: 02/23/2016 CLINICAL DATA:  83 year old female with shortness of breath. Respiratory failure. Acute renal failure. Initial encounter. EXAM: PORTABLE CHEST 1 VIEW COMPARISON:  02/18/2016 and earlier. FINDINGS: Portable AP upright view at at 0536 hours. A right IJ approach dual lumen dialysis type catheter has been placed. No pneumothorax. Stable cardiomegaly and mediastinal contours. Continued veiling and confluent bibasilar opacity. Stable pulmonary vascularity without overt edema. IMPRESSION: 1. Right IJ dual-lumen catheter placed.  No pneumothorax. 2. Bilateral pleural effusions and lower lobe collapse or consolidation. Electronically Signed  By: Joan Erickson M.D.   On: 02/23/2016 07:28   ASSESSMENT AND PLAN:  Joan Erickson  is a 83 y.o. female with a known history of A. fib, hypertension, hyperlipidemia, CKD stage 4 and asthma. The patient was treated for pneumonia with antibiotics a month ago by her primary care doctor. She has been feeling increasingly weak and shortness  of breath on exertion for the past few days. Started to have a palpitation today and feels cold. Her PCP told her that her heart rate was fast and sent patient to ED.   *Acute hypoxic respiratory failure with hypercapnia secondary to sedation for procedure and poor inspiratory effort Clinically improved status post hemodialysis  Of BiPAP Improved now, will check for O2 requirement on discharge. Several doses of flumazenil was given following the procedure after permacath placement on 03/18/2016.   pulmonology signed off   * Acute on chrnicA. fib with RVR with bradycardia- sick sinus syndrome Cardizem and amiodarone drip discontinued Patient is asymptomatic  -Resume Coumadin - ultrasound with no evidence of DVT and upper extremity  -Pauses were noticed while patient was on amiodarone drip, discontinued Continue Cardizem CD 360 mg by mouth  Oral amio and metoprolol as per cardio.  *SVT-resolved -Status post rapid response and adenosine  * Pneumonia - Ruled out - normal procalcitonin - stopped Abx - ID c/s as blood c/s growing GPR - likely false +  repeat cx negative.  *Elevated troponin, due to  A. fib and renal failure, neg serial troponins and cardiology following, hold Coumadin.   DVT prophylaxis   On coumadin now.  *Acute renal failure /Hyponatremia: Na improved from 125-131--134  nephro following.    Patient had permacath placement 02/27/2016  *Diabetes: sliding scale. Hold Tradjenta  *Insomnia Benadryl as needed   home health PT  Case discussed with Care Management/Social Worker. Management plans discussed with the patient,  daughter and they are in agreement.   CODE STATUS: full  DVT Prophylaxis: heparin  TOTAL TIME TAKING CARE OF THIS PATIENT: 57minutes.  >50% time spent on counselling and coordination of care Pt still had few SVT and A fib episodes, continue to monitor.  POSSIBLE D/C IN 1 DAYS, DEPENDING ON CLINICAL CONDITION.  Note: This dictation  was prepared with Dragon dictation along with smaller phrase technology. Any transcriptional errors that result from this process are unintentional.  Joan Erickson M.D on 02/23/2016 at 1:06 PM  Between 7am to 6pm - Pager - [phone]  After 6pm go to [email or web address] - password [email or web address] [facility]  Office  [phone]  CC: Primary care physician; Einar Pheasant, MD

## 2016-02-23 NOTE — Plan of Care (Signed)
Problem: Health Behavior/Discharge Planning: Goal: Ability to manage health-related needs will improve Outcome: Progressing See progress note.  Reviewed meds and home interventions

## 2016-02-23 NOTE — Progress Notes (Signed)
Pt's daughter is concerned with why Citalopram has been discontinued.

## 2016-02-23 NOTE — Progress Notes (Signed)
Pt heartrate up nonsustaining. Was notified by CCMD. Monitored and assessed pt throughout the night. Metropolol 25mg  po given early at 62.

## 2016-02-23 NOTE — Progress Notes (Signed)
ANTICOAGULATION CONSULT NOTE - Follow Up Consult  Pharmacy Consult for warfarin  Indication: atrial fibrillation   No Known Allergies  Patient Measurements: Height: 5' (152.4 cm) Weight:  (BEDSCALE INOP.) IBW/kg (Calculated) : 45.5  Vital Signs: Temp: 97.6 F (36.4 C) (02/04 0732) Temp Source: Oral (02/04 0732) BP: 153/73 (02/04 0732) Pulse Rate: 138 (02/04 0732)  Labs:  Recent Labs  02/21/16 0548 02/22/16 0617 02/23/16 0602  HGB 8.0* 7.8* 8.1*  HCT 23.1* 23.0* 24.2*  PLT 267 241 254  LABPROT 16.2* 15.7* 17.6*  INR 1.29 1.24 1.43  CREATININE 3.00*  --  3.34*    Estimated Creatinine Clearance: 10.8 mL/min (by C-G formula based on SCr of 3.34 mg/dL (H)).   Medical History: Past Medical History:  Diagnosis Date  . Asthma   . Atrial fibrillation ([place])   . GERD (gastroesophageal reflux disease)   . History of colon polyps   . Hypercholesterolemia   . Hypertension   . Hypogammaglobulinemia (H[facility])    IgA  . Warfarin anticoagulation   . Xanthogranulomatous pyelonephritis    s/p right nephrectomy    Medications:  Prescriptions Prior to Admission  Medication Sig Dispense Refill Last Dose  . acetaminophen (TYLENOL) 500 MG tablet Take 500 mg by mouth every 6 (six) hours as needed.   prn at prn  . ADVAIR DISKUS 250-50 MCG/DOSE AEPB INHALE 1 PUFF BY MOUTH TWICE A DAY 60 each 5 02/10/2016 at 2100  . chlorthalidone (HYGROTON) 25 MG tablet Take 0.5 tablets (12.5 mg total) by mouth daily. 30 tablet 2 01/29/2016 at 0900  . Cholecalciferol (VITAMIN D) 2000 UNITS tablet Take 2,000 Units by mouth daily.   02/10/2016 at 0900  . citalopram (CELEXA) 10 MG tablet Take 1 tablet (10 mg total) by mouth daily. 90 tablet 1 01/20/2016 at 0900  . diltiazem (TIAZAC) 360 MG 24 hr capsule Take 1 capsule (360 mg total) by mouth daily. 90 capsule 1 01/25/2016 at 0900  . finasteride (PROSCAR) 5 MG tablet    01/27/2016 at 0900  . fluticasone (FLONASE) 50 MCG/ACT nasal spray USE 2 SPRAYS IN EACH  NOSTRIL ONCE A DAY 16 g 5 02/10/2016 at Unknown time  . montelukast (SINGULAIR) 10 MG tablet Take 1 tablet (10 mg total) by mouth daily. 90 tablet 1 02/10/2016 at 0900  . pravastatin (PRAVACHOL) 20 MG tablet TAKE 1 TABLET (20 MG TOTAL) BY MOUTH DAILY. 30 tablet 8 02/10/2016 at 2100  . PROAIR HFA 108 (90 Base) MCG/ACT inhaler INHALE 2 PUFF EVERY SIX HOURS AS NEEDED 8.5 Inhaler 2 02/10/2016 at 0900  . TRADJENTA 5 MG TABS tablet TAKE 1 TABLET (5 MG TOTAL) BY MOUTH DAILY. 30 tablet 11 02/12/2016 at 0900  . warfarin (COUMADIN) 1 MG tablet Take 1 mg by mouth at bedtime. Pt takes 0.5 t in addition to the 5 mg dose  4 02/10/2016 at 2100  . warfarin (COUMADIN) 5 MG tablet Take 5 mg by mouth at bedtime. Pt takes 5 mg dose along with 0.5 of a 1 mg tab qhs   02/10/2016 at 2100  . Blood Glucose Monitoring Suppl (ONE TOUCH ULTRA SYSTEM KIT) W/DEVICE KIT 1 kit by Does not apply route once. 1 each 0 Taking  . eplerenone (INSPRA) 25 MG tablet TAKE 1 TABLET (25 MG TOTAL) BY MOUTH ONCE DAILY FOR 14 DAYS.  0 Taking  . fluticasone (FLONASE) 50 MCG/ACT nasal spray USE 2 SPRAYS IN EACH NOSTRIL ONCE A DAY 16 g 1 Taking  . ONE TOUCH ULTRA TEST test  strip CHECK BLOOD SUGAR 2 TIMES A DAY DX: E11.9 100 each 3 Taking   Scheduled:  . amiodarone  200 mg Oral Daily  . arformoterol  15 mcg Nebulization BID  . budesonide (PULMICORT) nebulizer solution  0.25 mg Nebulization BID  . diltiazem  240 mg Oral Daily  . epoetin (EPOGEN/PROCRIT) injection  10,000 Units Intravenous Q T,Th,Sa-HD  . feeding supplement (ENSURE ENLIVE)  237 mL Oral BID BM  . heparin subcutaneous  5,000 Units Subcutaneous Q8H  . insulin aspart  0-5 Units Subcutaneous QHS  . insulin aspart  0-9 Units Subcutaneous TID WC  . metoprolol tartrate  25 mg Oral Q12H  . pravastatin  20 mg Oral q1800  . sodium chloride flush  3 mL Intravenous Q12H  . Warfarin - Pharmacist Dosing Inpatient   Does not apply q1800    Assessment: Pharmacy consulted to dose and monitor  warfarin therapy in this 83 year old woman with atrial fibrillation. Warfarin was held since 1/29 and patient is now s/p permcath placement. Home warfarin dose is 5.43m  daily.   2/2  INR   1.29   Warfarin 5 mg 2/3  INR   1.24   5 mg 2/4  INR   1.43    Goal of Therapy:  INR 2-3 Monitor platelets by anticoagulation protocol: Yes   Plan:  INR subtherapeutic at 1.29. Patient had warfarin held since the 1/28. Will start patient with a load dose of warfarin 524mtoday. Suspect that warfarin will need to be decreased from home dose as patient started on amiodarone in the hospital. Patient is currently receiving DVT ppx with heparin SQ at this time till INR comes up.   2/3:  Will give Warfarin 5 mg again tonight. F/u INR in am.  2/4: Will order Warfarin 5 mg x 1 tonight. F/u INR in am.    MeNoralee SpacePharmD  02/23/2016 11:04 AM

## 2016-02-24 LAB — PROTIME-INR
INR: 1.81
PROTHROMBIN TIME: 21.2 s — AB (ref 11.4–15.2)

## 2016-02-24 LAB — GLUCOSE, CAPILLARY
GLUCOSE-CAPILLARY: 143 mg/dL — AB (ref 65–99)
GLUCOSE-CAPILLARY: 122 mg/dL — AB (ref 65–99)
Glucose-Capillary: 162 mg/dL — ABNORMAL HIGH (ref 65–99)
Glucose-Capillary: 140 mg/dL — ABNORMAL HIGH (ref 65–99)

## 2016-02-24 MED ORDER — METOPROLOL TARTRATE 25 MG PO TABS: 25.0000 mg | ORAL_TABLET | Freq: Two times a day (BID) | ORAL | 0 refills | Status: AC

## 2016-02-24 MED ORDER — AMIODARONE HCL 200 MG PO TABS: 200.0000 mg | ORAL_TABLET | Freq: Every day | ORAL | 0 refills | Status: AC

## 2016-02-24 MED ORDER — WARFARIN SODIUM 5 MG PO TABS
5.0000 mg | ORAL_TABLET | Freq: Once | ORAL | Status: AC
  Administered 2016-02-24: 5 mg via ORAL
  Filled 2016-02-24: qty 1

## 2016-02-24 MED ORDER — ARFORMOTEROL TARTRATE 15 MCG/2ML IN NEBU: 15.0000 ug | INHALATION_SOLUTION | Freq: Two times a day (BID) | RESPIRATORY_TRACT | 0 refills | Status: AC

## 2016-02-24 NOTE — Care Management (Signed)
No agency preference for  02 and home health.  Home health SN called to [facility].  O2 call to [facility] and will obtain the portable 02 concentrator.  Notified Cheryl with [facility] of discharge.

## 2016-02-24 NOTE — Care Management Important Message (Signed)
Important Message  Patient Details  Name: Joan Erickson MRN: [id number] Date of Birth: Feb 05, 1931   Medicare Important Message Given:  Yes    Katrina Stack, RN 02/24/2016, 11:34 AM

## 2016-02-24 NOTE — Progress Notes (Signed)
Physical Therapy Treatment Patient Details Name: Joan Erickson MRN: [id number] DOB: 20-Oct-1931 Today's Date: 02/24/2016    History of Present Illness 83 y.o. female with a known history of A. fib, hypertension, hyperlipidemia, CKD stage 4 and asthma. The patient was treated for pneumonia with antibiotics a month ago. She has had increasingly weakness and shortness of breath on exertion.  Erickson had temp fem cath and has been having dialysis t/o the admission.    Erickson Comments    Erickson demonstrates good motivation with therapist on this date. She is able to ambulate a full lap around RN station. Prior to ambulation Erickson placed on room air. SaO2 slowly drops to 86% and does not recover. Erickson placed on 4L/min supplemental O2 and SaO2 recovers to 95% at rest. During ambulation on 4L/min O2 SaO2 remains at or above 90% for the first 110'. However slightly past halfway SaO2 drops to 87% and does not recover with standing rest break until O2 increased to 6L/min. Even then Erickson requires and addition 1.5-2 minutes for SaO2 to recover to 91%. Erickson left on 6L/min for rest of ambulation distance. SaO2 intermittently drops to 88% but recovers to 90-91% with standing rest break and pursed lip breathing. Erickson with increased RR and accessory muscle use when SaO2 drops below 90%. RN notified of SaO2 readings at rest and with exertion. Erickson will benefit from skilled Erickson services to address deficits in strength, balance, and mobility in order to return to full function at home.    Follow Up Recommendations  Home health Erickson     Equipment Recommendations  Other (comment) (Home O2)    Recommendations for Other Services       Precautions / Restrictions Precautions Precautions: Fall Restrictions Weight Bearing Restrictions: No    Mobility  Bed Mobility               General bed mobility comments: Received and left upright in recliner. Not assessed on this date  Transfers Overall transfer level: Independent Equipment used:  None             General transfer comment: Erickson demonstrates good speed/sequencing as well as confidence with transfers. Good stability in standing  Ambulation/Gait Ambulation/Gait assistance: Min guard Ambulation Distance (Feet): 220 Feet Assistive device: None Gait Pattern/deviations: Decreased step length - right;Decreased step length - left Gait velocity: Decreased but functional for limited community mobility   General Gait Details: Erickson able to ambulate a full lap around RN station. Prior to ambulation Erickson placed on room air. SaO2 slowly drops to 86% and does not recover. Erickson placed on 4L/min supplemental O2 and SaO2 recovers to 95% at rest. During ambulation on 4L/min O2 SaO2 remains at or above 90% for the first 110'. However slightly past halfway SaO2 drops to 87% and does not recover with standing rest break until O2 increased to 6L/min. Even then Erickson requires and addition 1.5-2 minutes for SaO2 to recover to 91%. Erickson left on 6L/min for rest of ambulation distance. SaO2 intermittently drops to 88% but recovers to 90-91% with standing rest break and pursed lip breathing. Erickson with increased RR and accessory muscle use when SaO2 drops below 90%   Stairs            Wheelchair Mobility    Modified Rankin (Stroke Patients Only)       Balance  Cognition Arousal/Alertness: Awake/alert Behavior During Therapy: WFL for tasks assessed/performed Overall Cognitive Status: Within Functional Limits for tasks assessed                      Exercises      General Comments        Pertinent Vitals/Pain Pain Assessment: No/denies pain    Home Living                      Prior Function            Erickson Goals (current goals can now be found in the care plan section) Acute Rehab Erickson Goals Patient Stated Goal: go home Erickson Goal Formulation: With patient Time For Goal Achievement: 03/06/16 Potential to Achieve Goals:  Good Progress towards Erickson goals: Progressing toward goals    Frequency    Min 2X/week      Erickson Plan Current plan remains appropriate    Co-evaluation             End of Session Equipment Utilized During Treatment: Gait belt;Oxygen Activity Tolerance: Patient tolerated treatment well Patient left: in chair;with call bell/phone within reach;with family/visitor present     Time: [id number] Erickson Time Calculation (min) (ACUTE ONLY): 16 min  Charges:  $Therapeutic Exercise: 8-22 mins                    G Codes:      Joan Erickson, Joan Erickson   Joan Erickson 02/24/2016, 5:23 PM

## 2016-02-24 NOTE — Progress Notes (Signed)
ANTICOAGULATION CONSULT NOTE - Follow Up Consult  Pharmacy Consult for warfarin  Indication: atrial fibrillation   No Known Allergies  Patient Measurements: Height: 5' (152.4 cm) Weight:  (BEDSCALE INOP.) IBW/kg (Calculated) : 45.5  Vital Signs: Temp: 98 F (36.7 C) (02/05 1227) Temp Source: Oral (02/05 1227) BP: 156/59 (02/05 1227) Pulse Rate: 68 (02/05 1227)  Labs:  Recent Labs  02/22/16 0617 02/23/16 0602 02/24/16 0428  HGB 7.8* 8.1*  --   HCT 23.0* 24.2*  --   PLT 241 254  --   LABPROT 15.7* 17.6* 21.2*  INR 1.24 1.43 1.81  CREATININE  --  3.34*  --      Estimated Creatinine Clearance: 10.8 mL/min (by C-G formula based on SCr of 3.34 mg/dL (H)).   Medical History: Past Medical History:  Diagnosis Date  . Asthma   . Atrial fibrillation ([place])   . GERD (gastroesophageal reflux disease)   . History of colon polyps   . Hypercholesterolemia   . Hypertension   . Hypogammaglobulinemia ([facility])    IgA  . Warfarin anticoagulation   . Xanthogranulomatous pyelonephritis    s/p right nephrectomy    Medications:  Prescriptions Prior to Admission  Medication Sig Dispense Refill Last Dose  . acetaminophen (TYLENOL) 500 MG tablet Take 500 mg by mouth every 6 (six) hours as needed.   prn at prn  . ADVAIR DISKUS 250-50 MCG/DOSE AEPB INHALE 1 PUFF BY MOUTH TWICE A DAY 60 each 5 02/10/2016 at 2100  . chlorthalidone (HYGROTON) 25 MG tablet Take 0.5 tablets (12.5 mg total) by mouth daily. 30 tablet 2 02/09/2016 at 0900  . Cholecalciferol (VITAMIN D) 2000 UNITS tablet Take 2,000 Units by mouth daily.   01/25/2016 at 0900  . citalopram (CELEXA) 10 MG tablet Take 1 tablet (10 mg total) by mouth daily. 90 tablet 1 02/03/2016 at 0900  . diltiazem (TIAZAC) 360 MG 24 hr capsule Take 1 capsule (360 mg total) by mouth daily. 90 capsule 1 01/20/2016 at 0900  . finasteride (PROSCAR) 5 MG tablet    01/27/2016 at 0900  . fluticasone (FLONASE) 50 MCG/ACT nasal spray USE 2 SPRAYS IN EACH NOSTRIL  ONCE A DAY 16 g 5 02/10/2016 at Unknown time  . montelukast (SINGULAIR) 10 MG tablet Take 1 tablet (10 mg total) by mouth daily. 90 tablet 1 01/30/2016 at 0900  . pravastatin (PRAVACHOL) 20 MG tablet TAKE 1 TABLET (20 MG TOTAL) BY MOUTH DAILY. 30 tablet 8 02/10/2016 at 2100  . PROAIR HFA 108 (90 Base) MCG/ACT inhaler INHALE 2 PUFF EVERY SIX HOURS AS NEEDED 8.5 Inhaler 2 02/10/2016 at 0900  . TRADJENTA 5 MG TABS tablet TAKE 1 TABLET (5 MG TOTAL) BY MOUTH DAILY. 30 tablet 11 02/12/2016 at 0900  . warfarin (COUMADIN) 1 MG tablet Take 1 mg by mouth at bedtime. Pt takes 0.5 t in addition to the 5 mg dose  4 02/10/2016 at 2100  . warfarin (COUMADIN) 5 MG tablet Take 5 mg by mouth at bedtime. Pt takes 5 mg dose along with 0.5 of a 1 mg tab qhs   02/10/2016 at 2100  . Blood Glucose Monitoring Suppl (ONE TOUCH ULTRA SYSTEM KIT) W/DEVICE KIT 1 kit by Does not apply route once. 1 each 0 Taking  . eplerenone (INSPRA) 25 MG tablet TAKE 1 TABLET (25 MG TOTAL) BY MOUTH ONCE DAILY FOR 14 DAYS.  0 Taking  . fluticasone (FLONASE) 50 MCG/ACT nasal spray USE 2 SPRAYS IN EACH NOSTRIL ONCE A DAY 16 g  1 Taking  . ONE TOUCH ULTRA TEST test strip CHECK BLOOD SUGAR 2 TIMES A DAY DX: E11.9 100 each 3 Taking   Scheduled:  . amiodarone  200 mg Oral Daily  . arformoterol  15 mcg Nebulization BID  . budesonide (PULMICORT) nebulizer solution  0.25 mg Nebulization BID  . diltiazem  240 mg Oral Daily  . epoetin (EPOGEN/PROCRIT) injection  10,000 Units Intravenous Q T,Th,Sa-HD  . feeding supplement (ENSURE ENLIVE)  237 mL Oral BID BM  . heparin subcutaneous  5,000 Units Subcutaneous Q8H  . insulin aspart  0-5 Units Subcutaneous QHS  . insulin aspart  0-9 Units Subcutaneous TID WC  . metoprolol tartrate  25 mg Oral Q12H  . pravastatin  20 mg Oral q1800  . sodium chloride flush  3 mL Intravenous Q12H  . Warfarin - Pharmacist Dosing Inpatient   Does not apply q1800    Assessment: Pharmacy consulted to dose and monitor warfarin  therapy in this 83 year old woman with atrial fibrillation. Warfarin was held since 1/29 and patient is now s/p permcath placement. Home warfarin dose is 5.73m  daily.   2/2  INR   1.29   Warfarin 5 mg 2/3  INR   1.24   5 mg 2/4  INR   1.43; 5 mg 2/5: INR 1.81    Goal of Therapy:  INR 2-3 Monitor platelets by anticoagulation protocol: Yes   Plan:  INR subtherapeutic at 1.29. Patient had warfarin held since the 1/28. Will start patient with a load dose of warfarin 546mtoday. Suspect that warfarin will need to be decreased from home dose as patient started on amiodarone in the hospital. Patient is currently receiving DVT ppx with heparin SQ at this time till INR comes up.   2/3:  Will give Warfarin 5 mg again tonight. F/u INR in am.  2/4: Will order Warfarin 5 mg x 1 tonight. F/u INR in am.   2/5: Will give warfarin 5 mg PO x 1 again today.   JaLarene BeachPharmD  02/24/2016 12:53 PM

## 2016-02-24 NOTE — Plan of Care (Signed)
Problem: Respiratory: Goal: Respiratory symptoms related to disease process will be avoided Outcome: Progressing Continued need for oxygen, but much improved.  Problem: Coping: Goal: Ability to cope with  disease diagnosis and complications will improve Outcome: Progressing Both patient and husband are anxious about discharge.  Many questions about plan of care and how to deal with problems once they are home.  Her transfers to ICU are likely source of anxiety

## 2016-02-24 NOTE — Progress Notes (Signed)
O2 sats excellent on [place] 5L.  O2 reduced to 3L and then 2L.  Sats between 94 - 97%.  On room air she dropped to 88 %.  Returned her to 2L [place]

## 2016-02-24 NOTE — Progress Notes (Signed)
While sitting in a chair she can maintain O2 sats in low 90's at [place] 2L.  Ambulating we had to increase her oxygen to 6L to maintain O2 sats or 89-90.

## 2016-02-24 NOTE — Care Management (Addendum)
It appears that patient will possibly require home 02.  have requested documentation of home 02 assessment.  Patient may also benefit from home health nursing .  heads up referral to [facility] for SN and possibly PT.

## 2016-02-24 NOTE — Care Management Note (Signed)
Case Management Note  Patient Details  Name: ANNELIZ THORPE MRN: [id number] Date of Birth: 1931/11/29  Action/Plan: Discussed during progression that patient had weaned from 5 liters to 2 liters but this was done at rest.  Asked to see what sats were on exertion in regards to liter flow and found that patient was requiring 6 liters on exertion and satting 89-90%  Discussed with primary nurse, attending and patient.  Discharge will be cancelled.  Notified [facility] and [facility]  Expected Discharge Date:  02/24/16               Expected Discharge Plan:  [place]  In-House Referral:     Discharge planning Services  CM Consult  Post Acute Care Choice:  Durable Medical Equipment Choice offered to:  Patient, Spouse  DME Arranged:  Oxygen DME Agency:  [facility]  HH Arranged:  RN [place] Agency:  [facility]  Status of Service:  Completed, signed off  If discussed at [place] of Stay Meetings, dates discussed:    Additional Comments:  Katrina Stack, RN 02/24/2016, 3:08 PM

## 2016-02-24 NOTE — Progress Notes (Signed)
[place] [place] [facility] NAME: Joan Erickson    MR#:  [id number]  DATE OF BIRTH:  [birth year]  SUBJECTIVE:  Patient is Very awake and alert. Rate controlled on amiodarone and Cardizem drip Off BiPAP husband at bedside Pt is feeling comfortable, HR is under control. She had significant hypoxia on ambulation and required 6 ltr oxygen. Noted to have some pleural effusion on Xray chest. Pt feels much better, no complains.  REVIEW OF SYSTEMS:   Review of Systems  Constitutional: Negative for chills, fever and weight loss.  HENT: Negative for ear discharge, ear pain and nosebleeds.   Eyes: Negative for blurred vision, pain and discharge.  Respiratory: Negative for sputum production, shortness of breath, wheezing and stridor.   Cardiovascular: Negative for chest pain, palpitations, orthopnea and PND.  Gastrointestinal: Negative for abdominal pain, diarrhea, nausea and vomiting.  Genitourinary: Negative for frequency and urgency.  Musculoskeletal: Negative for back pain and joint pain.  Neurological: Positive for weakness. Negative for sensory change, speech change and focal weakness.  Psychiatric/Behavioral: Negative for depression and hallucinations. The patient is not nervous/anxious.    Tolerating Diet:yes  DRUG ALLERGIES:  No Known Allergies  VITALS:  Blood pressure (!) 156/59, pulse 68, temperature 98 F (36.7 C), temperature source Oral, resp. rate 18, height 5' (1.524 m), weight 63.1 kg (139 lb 3.2 oz), SpO2 92 %.  PHYSICAL EXAMINATION:   Physical Exam  GENERAL:  83 y.o.-year-old patient lying in the bed with no acute distress.  EYES: Pupils equal, round, reactive to light and accommodation. No scleral icterus. Extraocular muscles intact.  HEENT: Head atraumatic, normocephalic. Oropharynx and nasopharynx clear.  NECK:  Supple, no jugular venous distention. No thyroid enlargement, no tenderness.  LUNGS: Normal breath sounds  bilaterally, no wheezing, rales, rhonchi. No use of accessory muscles of respiration.  CARDIOVASCULAR: Irregularly irregular No murmurs, rubs, or gallops.  ABDOMEN: Soft, nontender, nondistended. Bowel sounds present. No organomegaly or mass.  EXTREMITIES: No bleeding noticed on the right groin area at the dialysis catheter site  No cyanosis, clubbing or edema b/l.    NEUROLOGIC: Cranial nerves II through XII are intact. No focal Motor or sensory deficits b/l.   PSYCHIATRIC:  patient is alert and oriented x 3.  SKIN: No obvious rash, lesion, or ulcer.  LABORATORY PANEL:  CBC  Recent Labs Lab 02/23/16 0602  WBC 9.2  HGB 8.1*  HCT 24.2*  PLT 254    Chemistries   Recent Labs Lab 02/18/16 0612  02/23/16 0602  NA 129*  < > 132*  K 4.3  < > 3.9  CL 97*  < > 95*  CO2 24  < > 31  GLUCOSE 180*  < > 146*  BUN 34*  < > 22*  CREATININE 3.86*  < > 3.34*  CALCIUM 7.9*  < > 7.8*  MG 2.1  --   --   < > = values in this interval not displayed. Cardiac Enzymes  Recent Labs Lab 02/18/16 1152  TROPONINI 0.06*   RADIOLOGY:  Dg Chest Port 1 View  Result Date: 02/23/2016 CLINICAL DATA:  83 year old female with shortness of breath. Respiratory failure. Acute renal failure. Initial encounter. EXAM: PORTABLE CHEST 1 VIEW COMPARISON:  02/18/2016 and earlier. FINDINGS: Portable AP upright view at at 0536 hours. A right IJ approach dual lumen dialysis type catheter has been placed. No pneumothorax. Stable cardiomegaly and mediastinal contours. Continued veiling and confluent bibasilar opacity. Stable pulmonary vascularity without overt edema.  IMPRESSION: 1. Right IJ dual-lumen catheter placed.  No pneumothorax. 2. Bilateral pleural effusions and lower lobe collapse or consolidation. Electronically Signed   By: Genevie Ann M.D.   On: 02/23/2016 07:28   ASSESSMENT AND PLAN:  Joan Erickson  is a 83 y.o. female with a known history of A. fib, hypertension, hyperlipidemia, CKD stage 4 and asthma. The patient  was treated for pneumonia with antibiotics a month ago by her primary care doctor. She has been feeling increasingly weak and shortness of breath on exertion for the past few days. Started to have a palpitation today and feels cold. Her PCP told her that her heart rate was fast and sent patient to ED.   *Acute hypoxic respiratory failure with hypercapnia secondary to sedation for procedure and poor inspiratory effort Clinically improved status post hemodialysis  Of BiPAP Improved now, Need O2 requirement on discharge. Several doses of flumazenil was given following the procedure after permacath placement on 02/24/2016.   pulmonology signed off   required 6 ltr oxygen via nasal canula on walking, so will currently not discharge.  * Acute on chrnicA. fib with RVR with bradycardia- sick sinus syndrome Cardizem and amiodarone drip discontinued Patient is asymptomatic  -Resume Coumadin- dosing per pharmacy. - ultrasound with no evidence of DVT and upper extremity  -Pauses were noticed while patient was on amiodarone drip, discontinued Continue Cardizem CD 240 mg by mouth  Oral amio and metoprolol as per cardio.  *SVT-resolved -Status post rapid response and adenosine  * Pneumonia - Ruled out - normal procalcitonin - stopped Abx - ID c/s as blood c/s growing GPR - likely false +  repeat cx negative.  *Elevated troponin, due to  A. fib and renal failure, neg serial troponins and cardiology following, hold Coumadin.   DVT prophylaxis   On coumadin now.  *Acute renal failure /Hyponatremia: Na improved from 125-131--134  nephro following.    Now ESRD, permacath placed, started on HD.   Patient had permacath placement 03/18/2016  *Diabetes: sliding scale. Hold Tradjenta  *Insomnia Benadryl as needed   home health PT  Case discussed with Care Management/Social Worker. Management plans discussed with the patient,  daughter and they are in agreement.   CODE STATUS: full  DVT  Prophylaxis: heparin  TOTAL TIME TAKING CARE OF THIS PATIENT: 36minutes.  >50% time spent on counselling and coordination of care Now stable, but requires high oxygen, monitor one more day, HD tomorrow.  POSSIBLE D/C IN 1 DAYS, DEPENDING ON CLINICAL CONDITION.  Note: This dictation was prepared with Dragon dictation along with smaller phrase technology. Any transcriptional errors that result from this process are unintentional.  Vaughan Basta M.D on 02/24/2016 at 6:29 PM  Between 7am to 6pm - Pager - [phone]  After 6pm go to [email or web address] - password [email or web address] [place]  Office  [phone]  CC: Primary care physician; Einar Pheasant, MD

## 2016-02-24 NOTE — Plan of Care (Signed)
Problem: Food- and Nutrition-Related Knowledge Deficit (NB-1.1) Goal: Nutrition education Formal process to instruct or train a patient/client in a skill or to impart knowledge to help patients/clients voluntarily manage or modify food choices and eating behavior to maintain or improve health. Outcome: Completed/Met Date Met: 02/24/16 Nutrition Education Note  RD consulted for Renal Education. Provided Choose-A-Meal Booklet to patient/family. Reviewed food groups and provided written recommended serving sizes specifically determined for patient's current nutritional status.   Explained why diet restrictions are needed and provided lists of foods to limit/avoid that are high potassium, sodium, and phosphorus. Provided specific recommendations on safer alternatives of these foods. Strongly encouraged compliance of this diet.   Discussed importance of protein intake at each meal and snack. Provided examples of how to maximize protein intake throughout the day. Discussed need for fluid restriction with dialysis, importance of minimizing weight gain between HD treatments, and renal-friendly beverage options.  Encouraged pt to discuss specific diet questions/concerns with RD at HD outpatient facility. Teach back method used.  Expect fair compliance.  Body mass index is 27.19 kg/m. Pt meets criteria for overweight based on current BMI.  Current diet order is renal/carb modified, patient is consuming approximately 100% of meals at this time. Labs and medications reviewed. No further nutrition interventions warranted at this time. RD contact information provided. If additional nutrition issues arise, please re-consult RD.  Satira Anis. Krysta Bloomfield, MS, RD LDN Inpatient Clinical Dietitian Pager [phone]

## 2016-02-25 ENCOUNTER — Inpatient Hospital Stay: Payer: Medicare Other

## 2016-02-25 DIAGNOSIS — N179 Acute kidney failure, unspecified: Principal | ICD-10-CM

## 2016-02-25 DIAGNOSIS — J9601 Acute respiratory failure with hypoxia: Secondary | ICD-10-CM

## 2016-02-25 DIAGNOSIS — J9602 Acute respiratory failure with hypercapnia: Secondary | ICD-10-CM

## 2016-02-25 DIAGNOSIS — R4189 Other symptoms and signs involving cognitive functions and awareness: Secondary | ICD-10-CM

## 2016-02-25 LAB — GLUCOSE, CAPILLARY
GLUCOSE-CAPILLARY: 154 mg/dL — AB (ref 65–99)
GLUCOSE-CAPILLARY: 84 mg/dL (ref 65–99)
GLUCOSE-CAPILLARY: 81 mg/dL (ref 65–99)
GLUCOSE-CAPILLARY: 130 mg/dL — AB (ref 65–99)
Glucose-Capillary: 162 mg/dL — ABNORMAL HIGH (ref 65–99)

## 2016-02-25 LAB — CBC
HCT: 21.8 % — ABNORMAL LOW (ref 35.0–47.0)
Hemoglobin: 7.2 g/dL — ABNORMAL LOW (ref 12.0–16.0)
MCH: 27.4 pg (ref 26.0–34.0)
MCHC: 32.8 g/dL (ref 32.0–36.0)
MCV: 83.5 fL (ref 80.0–100.0)
PLATELETS: 221 10*3/uL (ref 150–440)
RBC: 2.61 MIL/uL — AB (ref 3.80–5.20)
RDW: 15.7 % — ABNORMAL HIGH (ref 11.5–14.5)
WBC: 5.3 10*3/uL (ref 3.6–11.0)

## 2016-02-25 LAB — PHOSPHORUS
Phosphorus: 5.4 mg/dL — ABNORMAL HIGH (ref 2.5–4.6)
Phosphorus: 3.5 mg/dL (ref 2.5–4.6)

## 2016-02-25 LAB — BLOOD GAS, ARTERIAL
Acid-Base Excess: 2.4 mmol/L — ABNORMAL HIGH (ref 0.0–2.0)
Bicarbonate: 27.3 mmol/L (ref 20.0–28.0)
FIO2: 100
FIO2: 0.6
MECHANICAL RATE: 18
O2 Saturation: 95.2 %
O2 Saturation: 99.7 %
PCO2 ART: 43 mmHg (ref 32.0–48.0)
PEEP: 5 cmH2O
PO2 ART: 80 mmHg — AB (ref 83.0–108.0)
Patient temperature: 37
Patient temperature: 37
RATE: 18 resp/min
VT: 450 mL
pCO2 arterial: >120 mmHg (ref 32.0–48.0)
pH, Arterial: 7.01 — CL (ref 7.350–7.450)
pH, Arterial: 7.41 (ref 7.350–7.450)
pO2, Arterial: 207 mmHg — ABNORMAL HIGH (ref 83.0–108.0)

## 2016-02-25 LAB — BASIC METABOLIC PANEL
Anion gap: 12 (ref 5–15)
BUN: 55 mg/dL — AB (ref 6–20)
CO2: 27 mmol/L (ref 22–32)
CREATININE: 5.75 mg/dL — AB (ref 0.44–1.00)
Calcium: 8.3 mg/dL — ABNORMAL LOW (ref 8.9–10.3)
Chloride: 92 mmol/L — ABNORMAL LOW (ref 101–111)
GFR calc Af Amer: 7 mL/min — ABNORMAL LOW (ref >60)
GFR, EST NON AFRICAN AMERICAN: 6 mL/min — AB (ref >60)
Glucose, Bld: 161 mg/dL — ABNORMAL HIGH (ref 65–99)
Potassium: 4.5 mmol/L (ref 3.5–5.1)
SODIUM: 131 mmol/L — AB (ref 135–145)

## 2016-02-25 LAB — PROTIME-INR
INR: 2.28
PROTHROMBIN TIME: 25.5 s — AB (ref 11.4–15.2)

## 2016-02-25 LAB — MAGNESIUM
Magnesium: 2.3 mg/dL (ref 1.7–2.4)
Magnesium: 2 mg/dL (ref 1.7–2.4)

## 2016-02-25 LAB — TRIGLYCERIDES: Triglycerides: 125 mg/dL (ref <150)

## 2016-02-25 MED ORDER — PROPOFOL 1000 MG/100ML IV EMUL
5.0000 ug/kg/min | INTRAVENOUS | Status: DC
  Administered 2016-02-25: 07:00:00 via INTRAVENOUS
  Administered 2016-02-26: 15 ug/kg/min via INTRAVENOUS
  Administered 2016-02-27: 10 ug/kg/min via INTRAVENOUS
  Filled 2016-02-25 (×3): qty 100

## 2016-02-25 MED ORDER — WARFARIN SODIUM 5 MG PO TABS
5.0000 mg | ORAL_TABLET | Freq: Once | ORAL | Status: AC
  Administered 2016-02-25: 5 mg via ORAL
  Filled 2016-02-25: qty 1

## 2016-02-25 MED ORDER — ORAL CARE MOUTH RINSE
15.0000 mL | OROMUCOSAL | Status: DC
  Administered 2016-02-26 – 2016-03-02 (×49): 15 mL via OROMUCOSAL

## 2016-02-25 MED ORDER — FENTANYL CITRATE (PF) 100 MCG/2ML IJ SOLN
INTRAMUSCULAR | Status: AC
  Administered 2016-02-25: 100 ug
  Filled 2016-02-25: qty 2

## 2016-02-25 MED ORDER — SENNOSIDES-DOCUSATE SODIUM 8.6-50 MG PO TABS
1.0000 | ORAL_TABLET | Freq: Two times a day (BID) | ORAL | Status: DC
  Administered 2016-02-25 – 2016-02-27 (×5): 1 via ORAL
  Filled 2016-02-25 (×4): qty 1

## 2016-02-25 MED ORDER — PROPOFOL 1000 MG/100ML IV EMUL
INTRAVENOUS | Status: AC
  Filled 2016-02-25: qty 100

## 2016-02-25 MED ORDER — VITAL HIGH PROTEIN PO LIQD
1000.0000 mL | ORAL | Status: DC
  Administered 2016-02-25 – 2016-02-28 (×3): 1000 mL

## 2016-02-25 MED ORDER — CITALOPRAM HYDROBROMIDE 20 MG PO TABS
10.0000 mg | ORAL_TABLET | Freq: Every day | ORAL | Status: DC
  Administered 2016-02-26 – 2016-03-01 (×5): 10 mg
  Filled 2016-02-25 (×5): qty 1

## 2016-02-25 MED ORDER — FAMOTIDINE 20 MG PO TABS
20.0000 mg | ORAL_TABLET | Freq: Every day | ORAL | Status: DC
  Administered 2016-02-25 – 2016-02-27 (×3): 20 mg via ORAL
  Filled 2016-02-25 (×4): qty 1

## 2016-02-25 MED ORDER — PRO-STAT SUGAR FREE PO LIQD
30.0000 mL | Freq: Every day | ORAL | Status: DC
  Administered 2016-02-25 – 2016-02-26 (×2): 30 mL
  Administered 2016-02-27: 10:00:00
  Administered 2016-02-28 – 2016-03-01 (×3): 30 mL

## 2016-02-25 MED ORDER — FENTANYL CITRATE (PF) 100 MCG/2ML IJ SOLN
100.0000 ug | Freq: Once | INTRAMUSCULAR | Status: AC
  Administered 2016-02-25: 100 ug via INTRAVENOUS

## 2016-02-25 MED ORDER — MIDAZOLAM HCL 2 MG/2ML IJ SOLN
2.0000 mg | INTRAMUSCULAR | Status: DC | PRN
  Administered 2016-02-25 – 2016-02-29 (×4): 2 mg via INTRAVENOUS
  Filled 2016-02-25 (×4): qty 2

## 2016-02-25 MED ORDER — FENTANYL CITRATE (PF) 100 MCG/2ML IJ SOLN
50.0000 ug | INTRAMUSCULAR | Status: DC | PRN
  Administered 2016-02-28 – 2016-02-29 (×3): 50 ug via INTRAVENOUS
  Filled 2016-02-25 (×4): qty 2

## 2016-02-25 MED ORDER — DEXTROSE 5 % IV SOLN
0.0000 ug/min | INTRAVENOUS | Status: DC
  Administered 2016-02-25: 10 ug/min via INTRAVENOUS
  Filled 2016-02-25 (×3): qty 16

## 2016-02-25 MED ORDER — CHLORHEXIDINE GLUCONATE 0.12% ORAL RINSE (MEDLINE KIT)
15.0000 mL | Freq: Two times a day (BID) | OROMUCOSAL | Status: DC
  Administered 2016-02-26 – 2016-03-02 (×12): 15 mL via OROMUCOSAL

## 2016-02-25 MED ORDER — CITALOPRAM HYDROBROMIDE 10 MG/5ML PO SOLN: 10.0000 mg | Freq: Every day | ORAL | Status: DC

## 2016-02-25 MED ORDER — MIDAZOLAM HCL 2 MG/2ML IJ SOLN
2.0000 mg | Freq: Once | INTRAMUSCULAR | Status: AC
  Administered 2016-02-25: 2 mg via INTRAVENOUS

## 2016-02-25 MED ORDER — DILTIAZEM HCL 90 MG PO TABS
120.0000 mg | ORAL_TABLET | Freq: Two times a day (BID) | ORAL | Status: DC
  Administered 2016-02-25 – 2016-02-28 (×6): 120 mg via ORAL
  Filled 2016-02-25 (×6): qty 1

## 2016-02-25 MED ORDER — DILTIAZEM HCL 90 MG PO TABS
120.0000 mg | ORAL_TABLET | Freq: Once | ORAL | Status: AC
  Administered 2016-02-25: 16:00:00 120 mg via ORAL
  Filled 2016-02-25: qty 1

## 2016-02-25 MED ORDER — VECURONIUM BROMIDE 10 MG IV SOLR
10.0000 mg | Freq: Once | INTRAVENOUS | Status: AC
  Administered 2016-02-25: 10 mg via INTRAVENOUS

## 2016-02-25 NOTE — Progress Notes (Signed)
Post hd vitals 

## 2016-02-25 NOTE — Progress Notes (Signed)
Pre hd info 

## 2016-02-25 NOTE — Progress Notes (Signed)
ANTICOAGULATION CONSULT NOTE - Follow Up Consult  Pharmacy Consult for warfarin  Indication: atrial fibrillation   No Known Allergies  Patient Measurements: Height: 5' (152.4 cm) Weight:  (BEDSCALE INOP.) IBW/kg (Calculated) : 45.5  Vital Signs: Temp: 97.5 F (36.4 C) (02/06 1325) Temp Source: Oral (02/06 1325) BP: 126/108 (02/06 1403) Pulse Rate: 114 (02/06 1403)  Labs:  Recent Labs  02/23/16 0602 02/24/16 0428 02/25/16 0453 02/25/16 0908  HGB 8.1*  --   --  7.2*  HCT 24.2*  --   --  21.8*  PLT 254  --   --  221  LABPROT 17.6* 21.2* 25.5*  --   INR 1.43 1.81 2.28  --   CREATININE 3.34*  --   --  5.75*     Estimated Creatinine Clearance: 6.3 mL/min (by C-G formula based on SCr of 5.75 mg/dL (H)).   Medical History: Past Medical History:  Diagnosis Date  . Asthma   . Atrial fibrillation ([place])   . GERD (gastroesophageal reflux disease)   . History of colon polyps   . Hypercholesterolemia   . Hypertension   . Hypogammaglobulinemia ([facility])    IgA  . Warfarin anticoagulation   . Xanthogranulomatous pyelonephritis    s/p right nephrectomy    Medications:  Prescriptions Prior to Admission  Medication Sig Dispense Refill Last Dose  . acetaminophen (TYLENOL) 500 MG tablet Take 500 mg by mouth every 6 (six) hours as needed.   prn at prn  . ADVAIR DISKUS 250-50 MCG/DOSE AEPB INHALE 1 PUFF BY MOUTH TWICE A DAY 60 each 5 02/10/2016 at 2100  . chlorthalidone (HYGROTON) 25 MG tablet Take 0.5 tablets (12.5 mg total) by mouth daily. 30 tablet 2 02/01/2016 at 0900  . Cholecalciferol (VITAMIN D) 2000 UNITS tablet Take 2,000 Units by mouth daily.   02/01/2016 at 0900  . citalopram (CELEXA) 10 MG tablet Take 1 tablet (10 mg total) by mouth daily. 90 tablet 1 02/09/2016 at 0900  . diltiazem (TIAZAC) 360 MG 24 hr capsule Take 1 capsule (360 mg total) by mouth daily. 90 capsule 1 02/15/2016 at 0900  . finasteride (PROSCAR) 5 MG tablet    01/20/2016 at 0900  . fluticasone (FLONASE) 50  MCG/ACT nasal spray USE 2 SPRAYS IN EACH NOSTRIL ONCE A DAY 16 g 5 02/10/2016 at Unknown time  . montelukast (SINGULAIR) 10 MG tablet Take 1 tablet (10 mg total) by mouth daily. 90 tablet 1 01/23/2016 at 0900  . pravastatin (PRAVACHOL) 20 MG tablet TAKE 1 TABLET (20 MG TOTAL) BY MOUTH DAILY. 30 tablet 8 02/10/2016 at 2100  . PROAIR HFA 108 (90 Base) MCG/ACT inhaler INHALE 2 PUFF EVERY SIX HOURS AS NEEDED 8.5 Inhaler 2 02/10/2016 at 0900  . TRADJENTA 5 MG TABS tablet TAKE 1 TABLET (5 MG TOTAL) BY MOUTH DAILY. 30 tablet 11 01/22/2016 at 0900  . warfarin (COUMADIN) 1 MG tablet Take 1 mg by mouth at bedtime. Pt takes 0.5 t in addition to the 5 mg dose  4 02/10/2016 at 2100  . warfarin (COUMADIN) 5 MG tablet Take 5 mg by mouth at bedtime. Pt takes 5 mg dose along with 0.5 of a 1 mg tab qhs   02/10/2016 at 2100  . Blood Glucose Monitoring Suppl (ONE TOUCH ULTRA SYSTEM KIT) W/DEVICE KIT 1 kit by Does not apply route once. 1 each 0 Taking  . eplerenone (INSPRA) 25 MG tablet TAKE 1 TABLET (25 MG TOTAL) BY MOUTH ONCE DAILY FOR 14 DAYS.  0 Taking  .  fluticasone (FLONASE) 50 MCG/ACT nasal spray USE 2 SPRAYS IN EACH NOSTRIL ONCE A DAY 16 g 1 Taking  . ONE TOUCH ULTRA TEST test strip CHECK BLOOD SUGAR 2 TIMES A DAY DX: E11.9 100 each 3 Taking   Scheduled:  . amiodarone  200 mg Oral Daily  . arformoterol  15 mcg Nebulization BID  . budesonide (PULMICORT) nebulizer solution  0.25 mg Nebulization BID  . diltiazem  120 mg Oral Q12H  . epoetin (EPOGEN/PROCRIT) injection  10,000 Units Intravenous Q T,Th,Sa-HD  . famotidine  20 mg Oral Daily  . feeding supplement (PRO-STAT SUGAR FREE 64)  30 mL Per Tube Daily  . insulin aspart  0-5 Units Subcutaneous QHS  . insulin aspart  0-9 Units Subcutaneous TID WC  . metoprolol tartrate  25 mg Oral Q12H  . pravastatin  20 mg Oral q1800  . senna-docusate  1 tablet Oral BID  . sodium chloride flush  3 mL Intravenous Q12H  . Warfarin - Pharmacist Dosing Inpatient   Does not apply  q1800    Assessment: Pharmacy consulted to dose and monitor warfarin therapy in this 83 year old woman with atrial fibrillation. Warfarin was held since 1/29 and patient is now s/p permcath placement. Home warfarin dose is 5.77m  daily.   2/2  INR   1.29   Warfarin 5 mg 2/3  INR   1.24      5 mg 2/4  INR   1.43      5 mg 2/5: INR    1.81     520m 2/6 INR     2.28          Goal of Therapy:  INR 2-3 Monitor platelets by anticoagulation protocol: Yes   Plan:  INR subtherapeutic at 1.29. Patient had warfarin held since the 1/28. Will start patient with a load dose of warfarin 33m33moday. Suspect that warfarin will need to be decreased from home dose as patient started on amiodarone in the hospital. Patient is currently receiving DVT ppx with heparin SQ at this time till INR comes up.   2/6 Will give warfarin 33mg72m x1. F/U INR in the AM.   KellLoree FeearmD  02/25/2016 2:05 PM

## 2016-02-25 NOTE — Progress Notes (Signed)
Chaplain responded to a rapid response at 6:17AM. Patient was in 243 but she was transferred to ICU. In less than 20 minutes patient coded. There was a code blue to ICU 17 where patient was transferred. The medical team was able to revive the patient. There was no family member for the patient. Chaplain provided emotional support to staff.

## 2016-02-25 NOTE — Progress Notes (Signed)
[facility]  ROUNDING NOTE   Subjective:   Acute respiratory distress last night. Intubated and found to be in flash pulmonary edema.   Seen and examined on hemodialysis. UF goal of 3 litres.   Daughter at bedside.     HEMODIALYSIS FLOWSHEET:  Blood Flow Rate (mL/min): 400 mL/min Arterial Pressure (mmHg): -150 mmHg Venous Pressure (mmHg): 110 mmHg Transmembrane Pressure (mmHg): 40 mmHg Ultrafiltration Rate (mL/min): 800 mL/min Dialysate Flow Rate (mL/min): 600 ml/min Conductivity: Machine : 15 Conductivity: Machine : 15 Dialysis Fluid Bolus: Normal Saline Bolus Amount (mL): 250 mL (prime) Dialysate Change:  (3k) Intra-Hemodialysis Comments: 1056. no changes    Objective:  Vital signs in last 24 hours:  Temp:  [97.4 F (36.3 C)-98 F (36.7 C)] 97.4 F (36.3 C) (02/06 1128) Pulse Rate:  [65-103] 103 (02/06 1140) Resp:  [12-19] 14 (02/06 1140) BP: (73-163)/(46-78) 107/69 (02/06 1140) SpO2:  [88 %-100 %] 97 % (02/06 1110) FiO2 (%):  [60 %] 60 % (02/06 0704)  Weight change:  Filed Weights   02/16/16 1728 02/19/16 0955 02/19/16 1416  Weight: 63.1 kg (139 lb 1.8 oz) 63.6 kg (140 lb 3.4 oz) 63.1 kg (139 lb 3.2 oz)    Intake/Output: I/O last 3 completed shifts: In: 720 [P.O.:720] Out: 100 [Urine:100]   Intake/Output this shift:  No intake/output data recorded.  Physical Exam: General: Critically ill  Head: ETT  Eyes: Anicteric, PERRL  Neck: Supple, trachea midline  Lungs:  PRVC , bilateral crackles  Heart: Regular rate and rhythm  Abdomen:  Soft, nontender,   Extremities: 1+ peripheral edema.  Neurologic: Nonfocal, moving all four extremities  Skin: No lesions  Access: RIJ permcath    Basic Metabolic Panel:  Recent Labs Lab 02/19/16 0513 02/21/16 0548 02/22/16 0617 02/23/16 0602 02/25/16 0908  NA  --  134*  --  132* 131*  K  --  4.3  --  3.9 4.5  CL  --  99*  --  95* 92*  CO2  --  29  --  31 27  GLUCOSE  --  127*  --  146* 161*   BUN  --  21*  --  22* 55*  CREATININE  --  3.00*  --  3.34* 5.75*  CALCIUM  --  7.8*  --  7.8* 8.3*  PHOS 4.7*  --  5.0*  --   --     Liver Function Tests: No results for input(s): AST, ALT, ALKPHOS, BILITOT, PROT, ALBUMIN in the last 168 hours. No results for input(s): LIPASE, AMYLASE in the last 168 hours. No results for input(s): AMMONIA in the last 168 hours.  CBC:  Recent Labs Lab 03/15/2016 0512 02/21/16 0548 02/22/16 0617 02/23/16 0602 02/25/16 0908  WBC 5.5 6.4 7.7 9.2 5.3  HGB 7.4* 8.0* 7.8* 8.1* 7.2*  HCT 22.0* 23.1* 23.0* 24.2* 21.8*  MCV 82.7 82.9 84.0 83.0 83.5  PLT 267 267 241 254 221    Cardiac Enzymes: No results for input(s): CKTOTAL, CKMB, CKMBINDEX, TROPONINI in the last 168 hours.  BNP: Invalid input(s): POCBNP  CBG:  Recent Labs Lab 02/24/16 1229 02/24/16 1710 02/24/16 2108 02/25/16 0612 02/25/16 0639  GLUCAP 162* 140* 122* 162* 154*    Microbiology: Results for orders placed or performed during the hospital encounter of 02/07/2016  Blood culture (routine x 2)     Status: Abnormal   Collection Time: 02/01/2016 12:31 PM  Result Value Ref Range Status   Specimen Description BLOOD RIGHT [facility]  Final  Special Requests   Final    BOTTLES DRAWN AEROBIC AND ANAEROBIC AER 11ML ANA 8ML   Culture  Setup Time   Final    GRAM POSITIVE COCCI AEROBIC BOTTLE ONLY CRITICAL RESULT CALLED TO, READ BACK BY AND VERIFIED WITH:  KAREN HAYES AT [id number] 02/07/2016 SDR    Culture (A)  Final    FACKLAMIA HOMINIS Standardized susceptibility testing for this organism is not available. Performed at [facility], [place] [address] [place], [place] [address]    Report Status 02/17/2016 FINAL  Final  Blood Culture ID Panel (Reflexed)     Status: None   Collection Time: 01/31/2016 12:31 PM  Result Value Ref Range Status   Enterococcus species NOT DETECTED NOT DETECTED Final   Listeria monocytogenes NOT DETECTED NOT DETECTED Final   Staphylococcus species NOT DETECTED  NOT DETECTED Final   Staphylococcus aureus NOT DETECTED NOT DETECTED Final   Streptococcus species NOT DETECTED NOT DETECTED Final   Streptococcus agalactiae NOT DETECTED NOT DETECTED Final   Streptococcus pneumoniae NOT DETECTED NOT DETECTED Final   Streptococcus pyogenes NOT DETECTED NOT DETECTED Final   Acinetobacter baumannii NOT DETECTED NOT DETECTED Final   Enterobacteriaceae species NOT DETECTED NOT DETECTED Final   Enterobacter cloacae complex NOT DETECTED NOT DETECTED Final   Escherichia coli NOT DETECTED NOT DETECTED Final   Klebsiella oxytoca NOT DETECTED NOT DETECTED Final   Klebsiella pneumoniae NOT DETECTED NOT DETECTED Final   Proteus species NOT DETECTED NOT DETECTED Final   Serratia marcescens NOT DETECTED NOT DETECTED Final   Haemophilus influenzae NOT DETECTED NOT DETECTED Final   Neisseria meningitidis NOT DETECTED NOT DETECTED Final   Pseudomonas aeruginosa NOT DETECTED NOT DETECTED Final   Candida albicans NOT DETECTED NOT DETECTED Final   Candida glabrata NOT DETECTED NOT DETECTED Final   Candida krusei NOT DETECTED NOT DETECTED Final   Candida parapsilosis NOT DETECTED NOT DETECTED Final   Candida tropicalis NOT DETECTED NOT DETECTED Final  Blood culture (routine x 2)     Status: None   Collection Time: 02/19/2016 12:32 PM  Result Value Ref Range Status   Specimen Description BLOOD LEFT HAND  Final   Special Requests   Final    BOTTLES DRAWN AEROBIC AND ANAEROBIC AER 13ML ANA 13ML   Culture NO GROWTH 5 DAYS  Final   Report Status 02/16/2016 FINAL  Final  MRSA PCR Screening     Status: None   Collection Time: 01/26/2016 10:32 PM  Result Value Ref Range Status   MRSA by PCR NEGATIVE NEGATIVE Final    Comment:        The GeneXpert MRSA Assay (FDA approved for NASAL specimens only), is one component of a comprehensive MRSA colonization surveillance program. It is not intended to diagnose MRSA infection nor to guide or monitor treatment for MRSA infections.    Culture, expectorated sputum-assessment     Status: None   Collection Time: 02/19/2016  3:24 PM  Result Value Ref Range Status   Specimen Description SPUTUM  Final   Special Requests Normal  Final   Sputum evaluation   Final    Sputum specimen not acceptable for testing.  Please recollect.   RESULT CALLED TO, READ BACK BY AND VERIFIED WITH: Champ Mungo 02/06/2016 1608 KLW    Report Status 01/28/2016 FINAL  Final  Culture, blood (single) w Reflex to ID Panel     Status: None   Collection Time: 02/17/16  2:45 PM  Result Value Ref Range Status  Specimen Description BLOOD RIGHT ANTECUBITAL  Final   Special Requests   Final    BOTTLES DRAWN AEROBIC AND ANAEROBIC ANA [place] AER [place]   Culture NO GROWTH 5 DAYS  Final   Report Status 02/22/2016 FINAL  Final  Culture, blood (Routine X 2) w Reflex to ID Panel     Status: None   Collection Time: 02/18/16  6:12 AM  Result Value Ref Range Status   Specimen Description BLOOD R HAND  Final   Special Requests BOTTLES DRAWN AEROBIC AND ANAEROBIC 1ML  Final   Culture NO GROWTH 5 DAYS  Final   Report Status 02/23/2016 FINAL  Final  Culture, blood (Routine X 2) w Reflex to ID Panel     Status: None   Collection Time: 02/18/16  6:12 AM  Result Value Ref Range Status   Specimen Description BLOOD LEFT HAND  Final   Special Requests BOTTLES DRAWN AEROBIC AND ANAEROBIC 4ML  Final   Culture NO GROWTH 5 DAYS  Final   Report Status 02/23/2016 FINAL  Final    Coagulation Studies:  Recent Labs  02/23/16 0602 02/24/16 0428 02/25/16 0453  LABPROT 17.6* 21.2* 25.5*  INR 1.43 1.81 2.28    Urinalysis: No results for input(s): COLORURINE, LABSPEC, PHURINE, GLUCOSEU, HGBUR, BILIRUBINUR, KETONESUR, PROTEINUR, UROBILINOGEN, NITRITE, LEUKOCYTESUR in the last 72 hours.  Invalid input(s): APPERANCEUR    Imaging: Dg Abd 1 View  Result Date: 02/25/2016 CLINICAL DATA:  Status post orogastric tube placement EXAM: ABDOMEN - 1 VIEW COMPARISON:  Portable chest  x-ray of today's date FINDINGS: The orogastric tube tip and proximal port lie in the region of the gastric cardia. There is patchy interstitial density at the left lung base. The bowel gas pattern is unremarkable. There surgical clips in the left upper quadrant of the abdomen. IMPRESSION: The orogastric tube tip in proximal port lie in the region of the gastric cardia. Advancement by 5-10 cm would be useful. Electronically Signed   By: David  Martinique M.D.   On: 02/25/2016 09:16   Dg Chest Port 1 View  Result Date: 02/25/2016 CLINICAL DATA:  Chronic renal insufficiency stage IV with superimposed acute renal failure requiring dialysis. , respiratory failure, history of asthma, diabetes EXAM: PORTABLE CHEST 1 VIEW COMPARISON:  Portable chest x-ray of February 25, 2016 at 6:29 a.m. FINDINGS: The endotracheal tube tip has been and VATS and now all lies approximately 2.5 cm above the carina. The lungs are well-expanded. The interstitial markings are mildly prominent. The cardiac silhouette is enlarged. There is are small bilateral pleural effusions. The pulmonary vascularity is mildly engorged centrally. The esophagogastric tube proximal port is visible in projects in the region of the gastric cardia. The dual-lumen dialysis catheter tip projects at the cavoatrial junction. The left subclavian venous catheter tip projects over the midportion of the SVC. External pacemaker defibrillator pads are present. There is aortic arch calcification. IMPRESSION: Interval advancement of the endotracheal tube such that the tip now lies 2.5 cm above the carina. Mild pulmonary interstitial edema and small bilateral pleural effusions. Thoracic aortic atherosclerosis. Electronically Signed   By: David  Martinique M.D.   On: 02/25/2016 09:19   Dg Chest Port 1 View  Result Date: 02/25/2016 CLINICAL DATA:  Acute renal failure, intubated patient, history of asthma, diabetes EXAM: PORTABLE CHEST 1 VIEW COMPARISON:  Portable chest x-ray of  February 23, 2016 FINDINGS: The lungs are mildly hyperinflated. There is a persistent right pleural effusion layering posteriorly. There is increased density in the left  lower lobe with improved density in the right lower lobe. External pacemaker defibrillator pads are present. The cardiac silhouette is top-normal in size. The pulmonary vascularity remains mildly engorged but has improved since yesterday's study. The endotracheal tube tip lies above the level of the clavicular heads. This is approximately 7.5 cm above the carina. The large caliber dual-lumen dialysis catheter tip projects at or just above the cavoatrial junction. IMPRESSION: Decreased pulmonary interstitial edema today. Persistent left lower lobe atelectasis and moderate size right pleural effusion. Thoracic aortic atherosclerosis. High positioning of the endotracheal tube. Advancement by 4 cm is recommended. These results will be called to the ordering clinician or representative by the Radiologist Assistant, and communication documented in the PACS or zVision Dashboard. Electronically Signed   By: David  Martinique M.D.   On: 02/25/2016 08:15     Medications:   . sodium chloride    . feeding supplement (VITAL HIGH PROTEIN)    . norepinephrine (LEVOPHED) Adult infusion    . propofol (DIPRIVAN) infusion     . amiodarone  200 mg Oral Daily  . arformoterol  15 mcg Nebulization BID  . budesonide (PULMICORT) nebulizer solution  0.25 mg Nebulization BID  . diltiazem  240 mg Oral Daily  . epoetin (EPOGEN/PROCRIT) injection  10,000 Units Intravenous Q T,Th,Sa-HD  . famotidine  20 mg Oral Daily  . feeding supplement (ENSURE ENLIVE)  237 mL Oral BID BM  . feeding supplement (PRO-STAT SUGAR FREE 64)  30 mL Per Tube Daily  . insulin aspart  0-5 Units Subcutaneous QHS  . insulin aspart  0-9 Units Subcutaneous TID WC  . metoprolol tartrate  25 mg Oral Q12H  . pravastatin  20 mg Oral q1800  . senna-docusate  1 tablet Oral BID  . sodium chloride  flush  3 mL Intravenous Q12H  . Warfarin - Pharmacist Dosing Inpatient   Does not apply q1800   sodium chloride, sodium chloride, albuterol, bisacodyl, guaiFENesin-dextromethorphan, nitroGLYCERIN, [DISCONTINUED] ondansetron **OR** ondansetron (ZOFRAN) IV, senna-docusate, traZODone  Assessment/ Plan:  Joan Erickson is a 83 y.o. 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 ESRD: seen and examined on hemodialysis. Tolerating treatment well. UF goal of 3 litres.  With flash pulmonary edema. Will most likely need daily dialysis until fluid is removed.   2. Hypertension: hypotensive this morning, most likely due to sedatives.   3. Anemia of chronic kidney disease: hemogloibn 7.2  - epo with TTS treatments.   4. Secondary Hyperparathyroidism: phos and calcium at goal. Not currently on binders.   5. Hyponatremia: secondary to chronic kidney disease.    LOS: [place], [place] 2/6/201811:52 AM

## 2016-02-25 NOTE — Care Management (Signed)
Update emailed to [facility] Joan Erickson that patient is in ICU. Message also sent to [place] with [facility] home health to update.

## 2016-02-25 NOTE — Progress Notes (Signed)
Called by RN taking care of patient, patient is short of breath and with low saturation. Has decreased responsiveness.Patient transferred to ICU and will be intubated electively and put on ventilator. Intensivist consult and critical care team for further management.

## 2016-02-25 NOTE — Progress Notes (Signed)
Nutrition Follow-up  DOCUMENTATION CODES:   Not applicable  INTERVENTION:  1) Discussed nutrition poc during ICU rounds with MD Ashby Dawes and received verbal order to initiate EN 2) EN: recommend starting Vital High Protein at goal rate of 45 ml/hr with Prostat daily providing 110 g of protein, 1180 kcals, 907 mL of free water. PEPuP initiated. Minimal calories from diprivan at present. Continue to assess   NUTRITION DIAGNOSIS:   Inadequate oral intake related to acute illness as evidenced by per patient/family report.  Being addressed via TF  GOAL:   Patient will meet greater than or equal to 90% of their needs  MONITOR:   PO intake, Supplement acceptance, Labs, Weight trends  REASON FOR ASSESSMENT:   LOS    ASSESSMENT:   83 yo female admitted with sepsis with pneumonia, acute on CKD with hyperkalemia and hyponatremia, afib with RVR. 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, type 2 DM  Pt s/p rapid response yesterday, cardiac arrest, transferred to ICU  Patient is currently intubated on ventilator support MV: 8 L/min Temp (24hrs), Avg:97.8 F (36.6 C), Min:97.4 F (36.3 C), Max:98 F (36.7 C)  Propofol: 3 ml/hr (79 kcals)  Receiving HD today OG tube in stomach Labs: sodium 131, potassium wdl, Creatinine 5.75, BUN 55 Meds: diprivan (3 ml/hr)  Diet Order:  Diet - low sodium heart healthy Diet NPO time specified  Skin:  Reviewed, no issues  Last BM:  02/21/16  Height:   Ht Readings from Last 1 Encounters:  01/24/2016 5' (1.524 m)    Weight:   Wt Readings from Last 1 Encounters:  02/19/16 139 lb 3.2 oz (63.1 kg)   Filed Weights   02/16/16 1728 02/19/16 0955 02/19/16 1416  Weight: 139 lb 1.8 oz (63.1 kg) 140 lb 3.4 oz (63.6 kg) 139 lb 3.2 oz (63.1 kg)    Ideal Body Weight:  45.45 kg  BMI:  Body mass index is 27.19 kg/m.  Estimated Nutritional Needs:   Kcal:  [id number] kcals  Protein:   95-126 g  Fluid:  1000 mL plus UOP  EDUCATION NEEDS:   Education needs addressed  Kerman Passey MS, [place], LDN [phone] Pager  [phone] Weekend/On-Call Pager

## 2016-02-25 NOTE — Progress Notes (Signed)
Start of hd 

## 2016-02-25 NOTE — Progress Notes (Signed)
[facility] Progess Note    ASSESSMENT/PLAN   Interim history:  58 F with history of CAF, asthma, CKD admitted 02/13/2016 with SOB and atrial flutter. Underwent temporary HD cath insertion 01/26. Transferred to ICU/SDU 02/01 with atrial flutter and rapid ventricular response. Underwent tunneled catheter placement 02/01. She was hypercarbic after that procedure and was transiently on BiPAP. Transferred to 2A on 2/3. She has been maintained on cardizem drip, Followed by nephrology, undergoing ultrafiltration and her oxygen had been weaned down to 2 L., She is being planned for discharge oxygen 2 L at rest, with 6 L when ambulating. However, early this morning. 2/6, the patient was found to be very short of breath with diminished responsiveness, RR was called. She was transferred to ICU where she was quickly intubated.    PULMONARY A:Acute hypoxic respiratory failure/respiratory distress. -Intubated 02/24/58 -Review of chest x-ray images. 2/6 shows diffuse pulmonary edema, increased from previous damage. This appears secondary to volume overload. -Vent settings PRvC/18/450/5/60% -ABG 7.41/43/207/27.3-compensated P:   -Continue vent support today, we'll wean as tolerated, starting tomorrow. -Discussed with nephrology, will undergo dialysis today for fluid removal.  CARDIOVASCULAR A: Acute on chronicA. fib with RVR with bradycardia- sick sinus syndrome P:  -Cardizem 240 mg by mouth daily, amiodarone by mouth. -Metoprolol by mouth daily. -Continue anticoagulation for atrial fibrillation.  RENAL A:  End-stage renal disease, history of right nephrectomy. P:   -Discussed with nephro, continue with dialysis before fluid removal.  GASTROINTESTINAL A:  -Start tube feeds.  HEMATOLOGIC A: Anemia-chronic  INFECTIOUS A:  -- P:    Micro/culture results:  BCx2 -- UC -- Sputum--  Antibiotics: --  ENDOCRINE A: Sliding-scale insulin  NEUROLOGIC A:   -Sedation.   MAJOR EVENTS/TEST RESULTS:   Best Practices  DVT Prophylaxis: Coumadin GI Prophylaxis: famotidine.    ---------------------------------------   ----------------------------------------   Name: Joan Erickson MRN: [id number] DOB: [birth year]    ADMISSION DATE:  02/18/2016    SUBJECTIVE:   Pt currently on the ventilator, can not provide history or review of systems.     VITAL SIGNS: Temp:  [97.8 F (36.6 C)-98 F (36.7 C)] 97.8 F (36.6 C) (02/06 0657) Pulse Rate:  [65-99] 66 (02/06 0657) Resp:  [13-18] 18 (02/06 0700) BP: (73-163)/(46-78) 74/50 (02/06 0700) SpO2:  [88 %-100 %] 100 % (02/06 0704) FiO2 (%):  [60 %] 60 % (02/06 0704) HEMODYNAMICS:   VENTILATOR SETTINGS: Vent Mode: PRVC FiO2 (%):  [60 %] 60 % Set Rate:  [18 bmp] 18 bmp Vt Set:  [450 mL] 450 mL PEEP:  [5 cmH20] 5 cmH20 INTAKE / OUTPUT:  Intake/Output Summary (Last 24 hours) at 02/25/16 0905 Last data filed at 02/24/16 1949  Gross per 24 hour  Intake              720 ml  Output                0 ml  Net              720 ml    PHYSICAL EXAMINATION: Physical Examination:   VS: BP (!) 74/50   Pulse 66   Temp 97.8 F (36.6 C) (Oral)   Resp 18   Ht 5' (1.524 m)   Wt 63.1 kg (139 lb 3.2 oz)   SpO2 100%   BMI 27.19 kg/m   General Appearance: No distress , on vent.  Neuro:without focal findings, mental status reduced, unresponsive.  HEENT: PERRLA, EOM intact. Pulmonary: Bilateral  crepitations.  CardiovascularNormal S1,S2.  No m/r/g.   Abdomen: Benign, Soft, non-tender. Renal:  No costovertebral tenderness  GU:  Not performed at this time. Endocrine: No evident thyromegaly. Skin:   warm, no rashes, no ecchymosis  Extremities: normal, no cyanosis, clubbing.   LABS:   LABORATORY PANEL:   CBC  Recent Labs Lab 02/23/16 0602  WBC 9.2  HGB 8.1*  HCT 24.2*  PLT 254    Chemistries   Recent Labs Lab 02/22/16 0617 02/23/16 0602  NA  --  132*  K  --  3.9  CL   --  95*  CO2  --  31  GLUCOSE  --  146*  BUN  --  22*  CREATININE  --  3.34*  CALCIUM  --  7.8*  PHOS 5.0*  --      Recent Labs Lab 02/24/16 0732 02/24/16 1229 02/24/16 1710 02/24/16 2108 02/25/16 0612 02/25/16 0639  GLUCAP 143* 162* 140* 122* 162* 154*    Recent Labs Lab 03/07/2016 1435  PHART 7.24*  PCO2ART 78*  PO2ART 73*   No results for input(s): AST, ALT, ALKPHOS, BILITOT, ALBUMIN in the last 168 hours.  Cardiac Enzymes  Recent Labs Lab 02/18/16 1152  TROPONINI 0.06*    RADIOLOGY:  Dg Chest Port 1 View  Result Date: 02/25/2016 CLINICAL DATA:  Acute renal failure, intubated patient, history of asthma, diabetes EXAM: PORTABLE CHEST 1 VIEW COMPARISON:  Portable chest x-ray of February 23, 2016 FINDINGS: The lungs are mildly hyperinflated. There is a persistent right pleural effusion layering posteriorly. There is increased density in the left lower lobe with improved density in the right lower lobe. External pacemaker defibrillator pads are present. The cardiac silhouette is top-normal in size. The pulmonary vascularity remains mildly engorged but has improved since yesterday's study. The endotracheal tube tip lies above the level of the clavicular heads. This is approximately 7.5 cm above the carina. The large caliber dual-lumen dialysis catheter tip projects at or just above the cavoatrial junction. IMPRESSION: Decreased pulmonary interstitial edema today. Persistent left lower lobe atelectasis and moderate size right pleural effusion. Thoracic aortic atherosclerosis. High positioning of the endotracheal tube. Advancement by 4 cm is recommended. These results will be called to the ordering clinician or representative by the Radiologist Assistant, and communication documented in the PACS or zVision Dashboard. Electronically Signed   By: David  Martinique M.D.   On: 02/25/2016 08:15       --Marda Stalker, MD.  ICU Pager: [phone] [facility] Office Number: [phone]  Patricia Pesa, M.D.  Vilinda Boehringer, M.D.  Merton Border, M.D  02/25/2016    Critical Care Attestation.  I have personally obtained a history, examined the patient, evaluated laboratory and imaging results, formulated the assessment and plan and placed orders. The Patient requires high complexity decision making for assessment and support, frequent evaluation and titration of therapies, application of advanced monitoring technologies and extensive interpretation of multiple databases. The patient has critical illness that could lead imminently to failure of 1 or more organ systems and requires the highest level of physician preparedness to intervene.  Critical Care Time devoted to patient care services described in this note is 55 minutes and is exclusive of time spent in procedures.

## 2016-02-25 NOTE — Progress Notes (Signed)
This note also relates to the following rows which could not be included: SpO2 - Cannot attach notes to unvalidated device data End Tidal CO2 (EtCO2) - Cannot attach notes to unvalidated device data  End of hd

## 2016-02-25 NOTE — Progress Notes (Signed)
Joan Erickson at [place] NAME: Joan Erickson    MR#:  [id number]  DATE OF BIRTH:  25-Apr-1931  SUBJECTIVE:  Patient is Very awake and alert. Rate controlled on amiodarone and Cardizem drip Off BiPAP husband at bedside  She had significant hypoxia on ambulation and required 6 ltr oxygen. Noted to have some pleural effusion on Xray chest.  Overnight she had worsening on the respiratory status and noted severe hypoxia on the morning so electively intubated in ICU and noted to have flash pulmonary edema, hemodialysis was done in ICU today and patient remains on ventilator.  REVIEW OF SYSTEMS:   Review of Systems  Unable to perform ROS: Intubated   Tolerating Diet:yes  DRUG ALLERGIES:  No Known Allergies  VITALS:  Blood pressure (!) 126/108, pulse (!) 114, temperature 97.5 F (36.4 C), temperature source Oral, resp. rate 18, height 5' (1.524 m), weight 63.1 kg (139 lb 3.2 oz), SpO2 100 %.  PHYSICAL EXAMINATION:   Physical Exam  GENERAL:  83 y.o.-year-old patient lying in the bed with no acute distress.  EYES: Pupils equal, round, reactive to light and accommodation. No scleral icterus.  HEENT: Head atraumatic, normocephalic. Oropharynx and nasopharynx clear.  NECK:  Supple, no jugular venous distention. No thyroid enlargement, no tenderness.  LUNGS: Normal breath sounds bilaterally, no wheezing, Some crepitations. No use of accessory muscles of respiration. ETT in place, on vent support. CARDIOVASCULAR: Irregularly irregular No murmurs, rubs, or gallops.  ABDOMEN: Soft, nontender, nondistended. Bowel sounds present. No organomegaly or mass.  EXTREMITIES: No cyanosis, clubbing or edema b/l.    NEUROLOGIC: Patient is sedated currently on ventilator support.   PSYCHIATRIC:  patient is sedated.  SKIN: No obvious rash, lesion, or ulcer.  LABORATORY PANEL:  CBC  Recent Labs Lab 02/25/16 0908  WBC 5.3  HGB 7.2*  HCT 21.8*  PLT 221     Chemistries   Recent Labs Lab 02/25/16 0908 02/25/16 1037  NA 131*  --   K 4.5  --   CL 92*  --   CO2 27  --   GLUCOSE 161*  --   BUN 55*  --   CREATININE 5.75*  --   CALCIUM 8.3*  --   MG  --  2.3   Cardiac Enzymes No results for input(s): TROPONINI in the last 168 hours. RADIOLOGY:  Dg Abd 1 View  Result Date: 02/25/2016 CLINICAL DATA:  Status post orogastric tube placement EXAM: ABDOMEN - 1 VIEW COMPARISON:  Portable chest x-ray of today's date FINDINGS: The orogastric tube tip and proximal port lie in the region of the gastric cardia. There is patchy interstitial density at the left lung base. The bowel gas pattern is unremarkable. There surgical clips in the left upper quadrant of the abdomen. IMPRESSION: The orogastric tube tip in proximal port lie in the region of the gastric cardia. Advancement by 5-10 cm would be useful. Electronically Signed   By: Joan Erickson M.D.   On: 02/25/2016 09:16   Dg Chest Port 1 View  Result Date: 02/25/2016 CLINICAL DATA:  Chronic renal insufficiency stage IV with superimposed acute renal failure requiring dialysis. , respiratory failure, history of asthma, diabetes EXAM: PORTABLE CHEST 1 VIEW COMPARISON:  Portable chest x-ray of February 25, 2016 at 6:29 a.m. FINDINGS: The endotracheal tube tip has been and VATS and now all lies approximately 2.5 cm above the carina. The lungs are well-expanded. The interstitial markings are mildly prominent. The cardiac silhouette  is enlarged. There is are small bilateral pleural effusions. The pulmonary vascularity is mildly engorged centrally. The esophagogastric tube proximal port is visible in projects in the region of the gastric cardia. The dual-lumen dialysis catheter tip projects at the cavoatrial junction. The left subclavian venous catheter tip projects over the midportion of the SVC. External pacemaker defibrillator pads are present. There is aortic arch calcification. IMPRESSION: Interval advancement of  the endotracheal tube such that the tip now lies 2.5 cm above the carina. Mild pulmonary interstitial edema and small bilateral pleural effusions. Thoracic aortic atherosclerosis. Electronically Signed   By: Joan Erickson M.D.   On: 02/25/2016 09:19   Dg Chest Port 1 View  Result Date: 02/25/2016 CLINICAL DATA:  Acute renal failure, intubated patient, history of asthma, diabetes EXAM: PORTABLE CHEST 1 VIEW COMPARISON:  Portable chest x-ray of February 23, 2016 FINDINGS: The lungs are mildly hyperinflated. There is a persistent right pleural effusion layering posteriorly. There is increased density in the left lower lobe with improved density in the right lower lobe. External pacemaker defibrillator pads are present. The cardiac silhouette is top-normal in size. The pulmonary vascularity remains mildly engorged but has improved since yesterday's study. The endotracheal tube tip lies above the level of the clavicular heads. This is approximately 7.5 cm above the carina. The large caliber dual-lumen dialysis catheter tip projects at or just above the cavoatrial junction. IMPRESSION: Decreased pulmonary interstitial edema today. Persistent left lower lobe atelectasis and moderate size right pleural effusion. Thoracic aortic atherosclerosis. High positioning of the endotracheal tube. Advancement by 4 cm is recommended. These results will be called to the ordering clinician or representative by the Radiologist Assistant, and communication documented in the PACS or zVision Dashboard. Electronically Signed   By: Joan Erickson M.D.   On: 02/25/2016 08:15   ASSESSMENT AND PLAN:  Joan Erickson  is a 83 y.o. female with a known history of A. fib, hypertension, hyperlipidemia, CKD stage 4 and asthma. The patient was treated for pneumonia with antibiotics a month ago by her primary care doctor. She has been feeling increasingly weak and shortness of breath on exertion for the past few days. Started to have a palpitation today  and feels cold. Her PCP told her that her heart rate was fast and sent patient to ED.   *Acute hypoxic respiratory failure with hypercapnia secondary to sedation ( on 02/21/2016) for procedure and poor inspiratory effort   Again on 02/25/16 - due to flash pulmonary edema. Initially Clinically improved status post hemodialysis   required 6 ltr oxygen via nasal canula on walking on 02/24/16, so held discharge.   On 02/25/16 early morning- intubated, nephrology is planning to do hemodialysis to remove some fluid and then we will try to wean her off.  * Acute on chrnicA. fib with RVR with bradycardia- sick sinus syndrome Cardizem and amiodarone drip discontinued -Resume Coumadin- dosing per pharmacy. - ultrasound with no evidence of DVT and upper extremity  -Pauses were noticed while patient was on amiodarone drip, discontinued Continue Cardizem CD 240 mg by mouth  Oral amio and metoprolol as per cardio.  *SVT-resolved -Status post rapid response and adenosine  * Pneumonia - Ruled out - normal procalcitonin - stopped Abx - ID c/s as blood c/s growing GPR - likely false +  repeat cx negative.  *Elevated troponin, due to  A. fib and renal failure, neg serial troponins and cardiology following, hold Coumadin.   * DVT prophylaxis   On coumadin now.  *  Acute renal failure /Hyponatremia: Na improved from 125-131--134  nephro following.    Now ESRD, permacath placed, started on HD.   Patient had permacath placement 02/28/2016  *Diabetes: sliding scale. Hold Tradjenta  *Insomnia Benadryl as needed   home health PT  Case discussed with Care Management/Social Worker. Management plans discussed with the patient,  daughter and they are in agreement.   CODE STATUS: full  DVT Prophylaxis: heparin  TOTAL CRITICAL CARE TIME TAKING CARE OF THIS PATIENT: 58minutes.  >50% time spent on counselling and coordination of care   POSSIBLE D/C IN 2-3 DAYS, DEPENDING ON CLINICAL CONDITION.  Note:  This dictation was prepared with Dragon dictation along with smaller phrase technology. Any transcriptional errors that result from this process are unintentional.  Vaughan Basta M.D on 02/25/2016 at 3:03 PM  Between 7am to 6pm - Pager - [phone]  After 6pm go to [email or web address] - password [email or web address] [facility]  Office  [phone]  CC: Primary care physician; Einar Pheasant, MD

## 2016-02-25 NOTE — Progress Notes (Signed)
Pre hd assessment  

## 2016-02-25 NOTE — Progress Notes (Signed)
Patient placed on bear hugger for temp of 94.0.

## 2016-02-25 NOTE — Progress Notes (Signed)
When RN went to assess patient and found patient unresponsive with oxygen saturations in the 80's. Patient initially placed on 6L, oxygen saturations came up to early 90's then starting dropping back into the 80's. Patient placed on nonrebreather, respiratory and MD called. MD placed orders for xray, and ABG. Rapid response called. Patient moved to CCU. Report given to Tess, Therapist, sports. Family updated, and escorted to CCU.

## 2016-02-25 NOTE — Progress Notes (Signed)
PT Cancellation Note  Patient Details Name: WILHELMINA VITRANO MRN: [id number] DOB: [birth year]   Cancelled Treatment:    Reason Eval/Treat Not Completed: Medical issues which prohibited therapy. Chart reviewed. Rapid response called this AM secondary to respiratory distress. Pt transferred to CCU and intubated. Due to decline in status will complete current order. Will need new order to re-evaluate pt once appropriate.  Lyndel Safe Lakeyn Dokken PT, DPT   Ivery Michalski 02/25/2016, 8:35 AM

## 2016-02-25 NOTE — Procedures (Signed)
Central Venous Catheter Placement: Indication: Patient receiving vesicant or irritant drug.; Patient receiving intravenous therapy for longer than 5 days.; Patient has limited or no vascular access.   Consent:obtained.   Risks and benefits explained in detail including risk of infection, bleeding, respiratory failure and death..   Hand washing performed prior to starting the procedure.   Procedure: An active timeout was performed and correct patient, name, & ID confirmed.  After explaining risk and benefits, patient was positioned correctly for central venous access. Patient was prepped using strict sterile technique including chlorohexadine preps, sterile drape, sterile gown and sterile gloves.  The area was prepped, draped and anesthetized in the usual sterile manner. Patient comfort was obtained.  A triple lumen catheter was placed in left subclavian Vein There was good blood return, however the guidewire became lodged in one of the lumens, therefore the lumen was removed with the guidewire. A second kit was opened, another guidewire was placed easily, and the catheter replaced over the guidewire. catheter caps were placed on lumens, catheter flushed easily, the line was secured and a sterile dressing and BIO-PATCH applied.     Number of Attempts: 1 Complications:none Estimated Blood Loss: none Chest Radiograph indicated and ordered.   Operator: Marda Stalker, M.D.   Marda Stalker, M.D.  Pulmonary & Critical Care Medicine

## 2016-02-25 NOTE — Progress Notes (Signed)
Per Dr. Enid Derry orders the ETT was advanced by 3 cm to 24 at the lip. A chest xray confirmed proper placement.

## 2016-02-25 NOTE — Significant Event (Signed)
Rapid Response Event Note  Overview:  Rapid called 0619 RRT RN arrived 0621 Respiratory Event  Initial Focused Assessment:  Pt was agonal breathing on non-rebreather.   Interventions:  Stat ABG obtained and CXR ordered. Pt immediately transferred to ICU. Pt intubated by Hinton Dyer NP.  Plan of Care (if not transferred):  Event Summary: Name of Physician Notified: Dr. Margaretmary Eddy at 1330    at    Outcome: Stayed in room and stabalized  Event End Time: Why

## 2016-02-25 NOTE — Progress Notes (Signed)
[place] for constipation prevention, electrolytes, and glucose management    No Known Allergies  Patient Measurements: Height: 5' (152.4 cm) Weight:  (BEDSCALE INOP.) IBW/kg (Calculated) : 45.5   Assessment: Pharmacy consulted for constipation prevention, electrolytes, and glucose management in this 83yo F with acute respiratory failure.    Plan:  1. Constipation prevention: Patient with last bowel movement on 2/5. Patient started on senna-docusate 1 tablet daily.  2. Electrolytes: No replacements at this time.  3. Glucose management: Patient currently on SSI. No further adjustments warranted at this time.   Vital Signs: Temp: 97.5 F (36.4 C) (02/06 1325) Temp Source: Oral (02/06 1325) BP: 127/72 (02/06 1329) Pulse Rate: 114 (02/06 1329) Intake/Output from previous day: 02/05 0701 - 02/06 0700 In: 720 [P.O.:720] Out: 100 [Urine:100] Intake/Output from this shift: Total I/O In: -  Out: 2500 [Other:2500]  Labs:  Recent Labs  02/23/16 0602 02/25/16 0908 02/25/16 1037  WBC 9.2 5.3  --   HGB 8.1* 7.2*  --   HCT 24.2* 21.8*  --   PLT 254 221  --   CREATININE 3.34* 5.75*  --   MG  --   --  2.3  PHOS  --   --  5.4*   Estimated Creatinine Clearance: 6.3 mL/min (by C-G formula based on SCr of 5.75 mg/dL (H)).   Microbiology: Recent Results (from the past 720 hour(s))  Blood culture (routine x 2)     Status: Abnormal   Collection Time: 02/19/2016 12:31 PM  Result Value Ref Range Status   Specimen Description BLOOD RIGHT AC  Final   Special Requests   Final    BOTTLES DRAWN AEROBIC AND ANAEROBIC AER 11ML ANA 8ML   Culture  Setup Time   Final    GRAM POSITIVE COCCI AEROBIC BOTTLE ONLY CRITICAL RESULT CALLED TO, READ BACK BY AND VERIFIED WITH:  [place] AT [id number] 01/21/2016 SDR    Culture (A)  Final    FACKLAMIA HOMINIS Standardized susceptibility testing for this organism is not available. Performed at [facility], [place] [address], [place], [place] [address]    Report Status 02/17/2016 FINAL  Final  Blood Culture ID Panel (Reflexed)     Status: None   Collection Time: 02/10/2016 12:31 PM  Result Value Ref Range Status   Enterococcus species NOT DETECTED NOT DETECTED Final   Listeria monocytogenes NOT DETECTED NOT DETECTED Final   Staphylococcus species NOT DETECTED NOT DETECTED Final   Staphylococcus aureus NOT DETECTED NOT DETECTED Final   Streptococcus species NOT DETECTED NOT DETECTED Final   Streptococcus agalactiae NOT DETECTED NOT DETECTED Final   Streptococcus pneumoniae NOT DETECTED NOT DETECTED Final   Streptococcus pyogenes NOT DETECTED NOT DETECTED Final   Acinetobacter baumannii NOT DETECTED NOT DETECTED Final   Enterobacteriaceae species NOT DETECTED NOT DETECTED Final   Enterobacter cloacae complex NOT DETECTED NOT DETECTED Final   Escherichia coli NOT DETECTED NOT DETECTED Final   Klebsiella oxytoca NOT DETECTED NOT DETECTED Final   Klebsiella pneumoniae NOT DETECTED NOT DETECTED Final   Proteus species NOT DETECTED NOT DETECTED Final   Serratia marcescens NOT DETECTED NOT DETECTED Final   Haemophilus influenzae NOT DETECTED NOT DETECTED Final   Neisseria meningitidis NOT DETECTED NOT DETECTED Final   Pseudomonas aeruginosa NOT DETECTED NOT DETECTED Final   Candida albicans NOT DETECTED NOT DETECTED Final   Candida glabrata NOT DETECTED NOT DETECTED Final   Candida krusei NOT DETECTED NOT DETECTED Final  Candida parapsilosis NOT DETECTED NOT DETECTED Final   Candida tropicalis NOT DETECTED NOT DETECTED Final  Blood culture (routine x 2)     Status: None   Collection Time: 01/28/2016 12:32 PM  Result Value Ref Range Status   Specimen Description BLOOD LEFT HAND  Final   Special Requests   Final    BOTTLES DRAWN AEROBIC AND ANAEROBIC AER 13ML ANA 13ML   Culture NO GROWTH 5 DAYS  Final   Report Status 02/16/2016 FINAL  Final  MRSA PCR Screening     Status: None    Collection Time: 02/07/2016 10:32 PM  Result Value Ref Range Status   MRSA by PCR NEGATIVE NEGATIVE Final    Comment:        The GeneXpert MRSA Assay (FDA approved for NASAL specimens only), is one component of a comprehensive MRSA colonization surveillance program. It is not intended to diagnose MRSA infection nor to guide or monitor treatment for MRSA infections.   Culture, expectorated sputum-assessment     Status: None   Collection Time: 02/07/2016  3:24 PM  Result Value Ref Range Status   Specimen Description SPUTUM  Final   Special Requests Normal  Final   Sputum evaluation   Final    Sputum specimen not acceptable for testing.  Please recollect.   RESULT CALLED TO, READ BACK BY AND VERIFIED WITH: Champ Mungo 02/01/2016 1608 KLW    Report Status 02/16/2016 FINAL  Final  Culture, blood (single) w Reflex to ID Panel     Status: None   Collection Time: 02/17/16  2:45 PM  Result Value Ref Range Status   Specimen Description BLOOD RIGHT ANTECUBITAL  Final   Special Requests   Final    BOTTLES DRAWN AEROBIC AND ANAEROBIC ANA 8CC AER [place]   Culture NO GROWTH 5 DAYS  Final   Report Status 02/22/2016 FINAL  Final  Culture, blood (Routine X 2) w Reflex to ID Panel     Status: None   Collection Time: 02/18/16  6:12 AM  Result Value Ref Range Status   Specimen Description BLOOD R HAND  Final   Special Requests BOTTLES DRAWN AEROBIC AND ANAEROBIC 1ML  Final   Culture NO GROWTH 5 DAYS  Final   Report Status 02/23/2016 FINAL  Final  Culture, blood (Routine X 2) w Reflex to ID Panel     Status: None   Collection Time: 02/18/16  6:12 AM  Result Value Ref Range Status   Specimen Description BLOOD LEFT HAND  Final   Special Requests BOTTLES DRAWN AEROBIC AND ANAEROBIC 4ML  Final   Culture NO GROWTH 5 DAYS  Final   Report Status 02/23/2016 FINAL  Final    Medical History: Past Medical History:  Diagnosis Date  . Asthma   . Atrial fibrillation ([place])   . GERD (gastroesophageal  reflux disease)   . History of colon polyps   . Hypercholesterolemia   . Hypertension   . Hypogammaglobulinemia ([facility])    IgA  . Warfarin anticoagulation   . Xanthogranulomatous pyelonephritis    s/p right nephrectomy     Loree Fee, PharmD 02/25/2016,1:49 PM

## 2016-02-26 ENCOUNTER — Inpatient Hospital Stay: Payer: Medicare Other

## 2016-02-26 DIAGNOSIS — E8779 Other fluid overload: Secondary | ICD-10-CM

## 2016-02-26 DIAGNOSIS — J810 Acute pulmonary edema: Secondary | ICD-10-CM

## 2016-02-26 LAB — MAGNESIUM
Magnesium: 2.2 mg/dL (ref 1.7–2.4)
Magnesium: 1.9 mg/dL (ref 1.7–2.4)

## 2016-02-26 LAB — BASIC METABOLIC PANEL
ANION GAP: 7 (ref 5–15)
BUN: 53 mg/dL — ABNORMAL HIGH (ref 6–20)
CHLORIDE: 98 mmol/L — AB (ref 101–111)
CO2: 29 mmol/L (ref 22–32)
Calcium: 7.8 mg/dL — ABNORMAL LOW (ref 8.9–10.3)
Creatinine, Ser: 4.8 mg/dL — ABNORMAL HIGH (ref 0.44–1.00)
GFR calc non Af Amer: 8 mL/min — ABNORMAL LOW (ref >60)
GFR, EST AFRICAN AMERICAN: 9 mL/min — AB (ref >60)
GLUCOSE: 140 mg/dL — AB (ref 65–99)
POTASSIUM: 3.7 mmol/L (ref 3.5–5.1)
Sodium: 134 mmol/L — ABNORMAL LOW (ref 135–145)

## 2016-02-26 LAB — BLOOD GAS, ARTERIAL
ACID-BASE EXCESS: 8.5 mmol/L — AB (ref 0.0–2.0)
BICARBONATE: 31.8 mmol/L — AB (ref 20.0–28.0)
FIO2: 0.3
LHR: 18 {breaths}/min
MECHVT: 450 mL
O2 SAT: 97.8 %
PATIENT TEMPERATURE: 37
PCO2 ART: 38 mmHg (ref 32.0–48.0)
PEEP: 5 cmH2O
PO2 ART: 89 mmHg (ref 83.0–108.0)
pH, Arterial: 7.53 — ABNORMAL HIGH (ref 7.350–7.450)

## 2016-02-26 LAB — POTASSIUM: Potassium: 3.5 mmol/L (ref 3.5–5.1)

## 2016-02-26 LAB — GLUCOSE, CAPILLARY
Glucose-Capillary: 116 mg/dL — ABNORMAL HIGH (ref 65–99)
Glucose-Capillary: 126 mg/dL — ABNORMAL HIGH (ref 65–99)
Glucose-Capillary: 127 mg/dL — ABNORMAL HIGH (ref 65–99)
Glucose-Capillary: 144 mg/dL — ABNORMAL HIGH (ref 65–99)
Glucose-Capillary: 169 mg/dL — ABNORMAL HIGH (ref 65–99)
Glucose-Capillary: 183 mg/dL — ABNORMAL HIGH (ref 65–99)
Glucose-Capillary: 226 mg/dL — ABNORMAL HIGH (ref 65–99)

## 2016-02-26 LAB — ABO/RH: ABO/RH(D): O POS

## 2016-02-26 LAB — PREPARE RBC (CROSSMATCH)

## 2016-02-26 LAB — CBC
HEMATOCRIT: 19.8 % — AB (ref 35.0–47.0)
HEMOGLOBIN: 6.5 g/dL — AB (ref 12.0–16.0)
MCH: 27.3 pg (ref 26.0–34.0)
MCHC: 33 g/dL (ref 32.0–36.0)
MCV: 82.9 fL (ref 80.0–100.0)
Platelets: 231 10*3/uL (ref 150–440)
RBC: 2.38 MIL/uL — AB (ref 3.80–5.20)
RDW: 16.1 % — AB (ref 11.5–14.5)
WBC: 9 10*3/uL (ref 3.6–11.0)

## 2016-02-26 LAB — PROTIME-INR
INR: 4.09 — AB
PROTHROMBIN TIME: 40.7 s — AB (ref 11.4–15.2)

## 2016-02-26 LAB — PHOSPHORUS
Phosphorus: 2.2 mg/dL — ABNORMAL LOW (ref 2.5–4.6)
Phosphorus: 1.3 mg/dL — ABNORMAL LOW (ref 2.5–4.6)

## 2016-02-26 MED ORDER — POTASSIUM PHOSPHATES 15 MMOLE/5ML IV SOLN
30.0000 mmol | Freq: Once | INTRAVENOUS | Status: DC
  Filled 2016-02-26: qty 10

## 2016-02-26 MED ORDER — LORAZEPAM 2 MG/ML IJ SOLN
1.0000 mg | Freq: Once | INTRAMUSCULAR | Status: AC
  Administered 2016-02-26: 1 mg via INTRAVENOUS
  Filled 2016-02-26: qty 1

## 2016-02-26 MED ORDER — METOPROLOL TARTRATE 5 MG/5ML IV SOLN
2.5000 mg | INTRAVENOUS | Status: DC | PRN
  Administered 2016-02-29: 5 mg via INTRAVENOUS
  Filled 2016-02-26: qty 5

## 2016-02-26 MED ORDER — FUROSEMIDE 10 MG/ML IJ SOLN
10.0000 mg | Freq: Once | INTRAMUSCULAR | Status: DC
  Filled 2016-02-26: qty 2

## 2016-02-26 MED ORDER — SODIUM CHLORIDE 0.9 % IV SOLN
Freq: Once | INTRAVENOUS | Status: AC
  Administered 2016-02-26: 18:00:00 via INTRAVENOUS

## 2016-02-26 MED ORDER — METOPROLOL TARTRATE 5 MG/5ML IV SOLN
5.0000 mg | Freq: Once | INTRAVENOUS | Status: AC
  Administered 2016-02-26: 5 mg via INTRAVENOUS
  Filled 2016-02-26: qty 5

## 2016-02-26 MED ORDER — POTASSIUM PHOSPHATES 15 MMOLE/5ML IV SOLN
20.0000 mmol | Freq: Once | INTRAVENOUS | Status: AC
  Administered 2016-02-26: 20 mmol via INTRAVENOUS
  Filled 2016-02-26: qty 6.67

## 2016-02-26 NOTE — Progress Notes (Signed)
  End of hd 

## 2016-02-26 NOTE — Progress Notes (Signed)
Post hd assessment 

## 2016-02-26 NOTE — Progress Notes (Signed)
[facility]  ROUNDING NOTE   Subjective:   Hemodialysis yesterday. Uf of 2.5 litres. Extra treatment today due to respiratory failure and pulmonary edema.   Seen and examined on hemodialysis.   Daughter at bedside.     HEMODIALYSIS FLOWSHEET:  Blood Flow Rate (mL/min): 350 mL/min Arterial Pressure (mmHg): -140 mmHg Venous Pressure (mmHg): 100 mmHg Transmembrane Pressure (mmHg): 40 mmHg Ultrafiltration Rate (mL/min): 800 mL/min Dialysate Flow Rate (mL/min): 600 ml/min Conductivity: Machine : 14 Conductivity: Machine : 14 Dialysis Fluid Bolus: Normal Saline Bolus Amount (mL): 250 mL (prime) Dialysate Change:  (3k) Intra-Hemodialysis Comments: 612. alert, bfr decreased d/t ap alarm    Objective:  Vital signs in last 24 hours:  Temp:  [97.4 F (36.3 C)-99.2 F (37.3 C)] 98.5 F (36.9 C) (02/07 1005) Pulse Rate:  [68-136] 81 (02/07 1055) Resp:  [0-25] 18 (02/07 1055) BP: (80-171)/(45-108) 86/59 (02/07 1055) SpO2:  [87 %-100 %] 100 % (02/07 1055) FiO2 (%):  [30 %-60 %] 30 % (02/07 0810) Weight:  [61.9 kg (136 lb 7.4 oz)] 61.9 kg (136 lb 7.4 oz) (02/07 0108)  Weight change:  Filed Weights   02/19/16 0955 02/19/16 1416 02/26/16 0108  Weight: 63.6 kg (140 lb 3.4 oz) 63.1 kg (139 lb 3.2 oz) 61.9 kg (136 lb 7.4 oz)    Intake/Output: I/O last 3 completed shifts: In: 764.3 [P.O.:240; NG/GT:524.3] Out: 2500 [Other:2500]   Intake/Output this shift:  No intake/output data recorded.  Physical Exam: General: Critically ill  Head: ETT  Eyes: Anicteric, PERRL  Neck: Supple, trachea midline  Lungs:  PRVC 30% , bilateral crackles  Heart: Regular rate and rhythm  Abdomen:  Soft, nontender,   Extremities: 1+ peripheral edema.  Neurologic: Nonfocal, moving all four extremities  Skin: No lesions  Access: RIJ permcath    Basic Metabolic Panel:  Recent Labs Lab 02/21/16 0548 02/22/16 0617 02/23/16 0602 02/25/16 0908 02/25/16 1037 02/25/16 1743  02/26/16 0455  NA 134*  --  132* 131*  --   --  134*  K 4.3  --  3.9 4.5  --   --  3.7  CL 99*  --  95* 92*  --   --  98*  CO2 29  --  31 27  --   --  29  GLUCOSE 127*  --  146* 161*  --   --  140*  BUN 21*  --  22* 55*  --   --  53*  CREATININE 3.00*  --  3.34* 5.75*  --   --  4.80*  CALCIUM 7.8*  --  7.8* 8.3*  --   --  7.8*  MG  --   --   --   --  2.3 2.0 2.2  PHOS  --  5.0*  --   --  5.4* 3.5 2.2*    Liver Function Tests: No results for input(s): AST, ALT, ALKPHOS, BILITOT, PROT, ALBUMIN in the last 168 hours. No results for input(s): LIPASE, AMYLASE in the last 168 hours. No results for input(s): AMMONIA in the last 168 hours.  CBC:  Recent Labs Lab 02/21/16 0548 02/22/16 0617 02/23/16 0602 02/25/16 0908 02/26/16 0455  WBC 6.4 7.7 9.2 5.3 9.0  HGB 8.0* 7.8* 8.1* 7.2* 6.5*  HCT 23.1* 23.0* 24.2* 21.8* 19.8*  MCV 82.9 84.0 83.0 83.5 82.9  PLT 267 241 254 221 231    Cardiac Enzymes: No results for input(s): CKTOTAL, CKMB, CKMBINDEX, TROPONINI in the last 168 hours.  BNP: Invalid input(s):  POCBNP  CBG:  Recent Labs Lab 02/25/16 1627 02/25/16 2005 02/26/16 0008 02/26/16 0401 02/26/16 0800  GLUCAP 81 130* 126* 144* 127*    Microbiology: Results for orders placed or performed during the hospital encounter of 02/16/2016  Blood culture (routine x 2)     Status: Abnormal   Collection Time: 02/12/2016 12:31 PM  Result Value Ref Range Status   Specimen Description BLOOD RIGHT AC  Final   Special Requests   Final    BOTTLES DRAWN AEROBIC AND ANAEROBIC AER 11ML ANA 8ML   Culture  Setup Time   Final    GRAM POSITIVE COCCI AEROBIC BOTTLE ONLY CRITICAL RESULT CALLED TO, READ BACK BY AND VERIFIED WITH:  [place] AT 3329 02/16/2016 SDR    Culture (A)  Final    FACKLAMIA HOMINIS Standardized susceptibility testing for this organism is not available. Performed at [facility], [place] [address] [place], [place] [address]    Report Status 02/17/2016 FINAL  Final   Blood Culture ID Panel (Reflexed)     Status: None   Collection Time: 02/19/2016 12:31 PM  Result Value Ref Range Status   Enterococcus species NOT DETECTED NOT DETECTED Final   Listeria monocytogenes NOT DETECTED NOT DETECTED Final   Staphylococcus species NOT DETECTED NOT DETECTED Final   Staphylococcus aureus NOT DETECTED NOT DETECTED Final   Streptococcus species NOT DETECTED NOT DETECTED Final   Streptococcus agalactiae NOT DETECTED NOT DETECTED Final   Streptococcus pneumoniae NOT DETECTED NOT DETECTED Final   Streptococcus pyogenes NOT DETECTED NOT DETECTED Final   Acinetobacter baumannii NOT DETECTED NOT DETECTED Final   Enterobacteriaceae species NOT DETECTED NOT DETECTED Final   Enterobacter cloacae complex NOT DETECTED NOT DETECTED Final   Escherichia coli NOT DETECTED NOT DETECTED Final   Klebsiella oxytoca NOT DETECTED NOT DETECTED Final   Klebsiella pneumoniae NOT DETECTED NOT DETECTED Final   Proteus species NOT DETECTED NOT DETECTED Final   Serratia marcescens NOT DETECTED NOT DETECTED Final   Haemophilus influenzae NOT DETECTED NOT DETECTED Final   Neisseria meningitidis NOT DETECTED NOT DETECTED Final   Pseudomonas aeruginosa NOT DETECTED NOT DETECTED Final   Candida albicans NOT DETECTED NOT DETECTED Final   Candida glabrata NOT DETECTED NOT DETECTED Final   Candida krusei NOT DETECTED NOT DETECTED Final   Candida parapsilosis NOT DETECTED NOT DETECTED Final   Candida tropicalis NOT DETECTED NOT DETECTED Final  Blood culture (routine x 2)     Status: None   Collection Time: 02/01/2016 12:32 PM  Result Value Ref Range Status   Specimen Description BLOOD LEFT HAND  Final   Special Requests   Final    BOTTLES DRAWN AEROBIC AND ANAEROBIC AER 13ML ANA 13ML   Culture NO GROWTH 5 DAYS  Final   Report Status 02/16/2016 FINAL  Final  MRSA PCR Screening     Status: None   Collection Time: 02/05/2016 10:32 PM  Result Value Ref Range Status   MRSA by PCR NEGATIVE NEGATIVE  Final    Comment:        The GeneXpert MRSA Assay (FDA approved for NASAL specimens only), is one component of a comprehensive MRSA colonization surveillance program. It is not intended to diagnose MRSA infection nor to guide or monitor treatment for MRSA infections.   Culture, expectorated sputum-assessment     Status: None   Collection Time: 02/10/2016  3:24 PM  Result Value Ref Range Status   Specimen Description SPUTUM  Final   Special Requests  Normal  Final   Sputum evaluation   Final    Sputum specimen not acceptable for testing.  Please recollect.   RESULT CALLED TO, READ BACK BY AND VERIFIED WITH: TARA [facility] 02/16/2016 1608 KLW    Report Status 02/07/2016 FINAL  Final  Culture, blood (single) w Reflex to ID Panel     Status: None   Collection Time: 02/17/16  2:45 PM  Result Value Ref Range Status   Specimen Description BLOOD RIGHT ANTECUBITAL  Final   Special Requests   Final    BOTTLES DRAWN AEROBIC AND ANAEROBIC ANA 8CC AER [place]   Culture NO GROWTH 5 DAYS  Final   Report Status 02/22/2016 FINAL  Final  Culture, blood (Routine X 2) w Reflex to ID Panel     Status: None   Collection Time: 02/18/16  6:12 AM  Result Value Ref Range Status   Specimen Description BLOOD R HAND  Final   Special Requests BOTTLES DRAWN AEROBIC AND ANAEROBIC 1ML  Final   Culture NO GROWTH 5 DAYS  Final   Report Status 02/23/2016 FINAL  Final  Culture, blood (Routine X 2) w Reflex to ID Panel     Status: None   Collection Time: 02/18/16  6:12 AM  Result Value Ref Range Status   Specimen Description BLOOD LEFT HAND  Final   Special Requests BOTTLES DRAWN AEROBIC AND ANAEROBIC 4ML  Final   Culture NO GROWTH 5 DAYS  Final   Report Status 02/23/2016 FINAL  Final    Coagulation Studies:  Recent Labs  02/24/16 0428 02/25/16 0453 02/26/16 0455  LABPROT 21.2* 25.5* 40.7*  INR 1.81 2.28 4.09*    Urinalysis: No results for input(s): COLORURINE, LABSPEC, PHURINE, GLUCOSEU, HGBUR,  BILIRUBINUR, KETONESUR, PROTEINUR, UROBILINOGEN, NITRITE, LEUKOCYTESUR in the last 72 hours.  Invalid input(s): APPERANCEUR    Imaging: Dg Chest 1 View  Result Date: 02/26/2016 CLINICAL DATA:  Acute respiratory failure. EXAM: CHEST 1 VIEW COMPARISON:  02/25/2016 FINDINGS: Endotracheal tube, central venous catheters and NG tube all appear in good position. Chronic cardiomegaly. Small bilateral pleural effusions. Pulmonary vascularity is normal. IMPRESSION: No significant change.  Small bilateral pleural effusions. Electronically Signed   By: Lorriane Shire M.D.   On: 02/26/2016 07:20   Dg Abd 1 View  Result Date: 02/25/2016 CLINICAL DATA:  Status post orogastric tube placement EXAM: ABDOMEN - 1 VIEW COMPARISON:  Portable chest x-ray of today's date FINDINGS: The orogastric tube tip and proximal port lie in the region of the gastric cardia. There is patchy interstitial density at the left lung base. The bowel gas pattern is unremarkable. There surgical clips in the left upper quadrant of the abdomen. IMPRESSION: The orogastric tube tip in proximal port lie in the region of the gastric cardia. Advancement by 5-10 cm would be useful. Electronically Signed   By: David  Martinique M.D.   On: 02/25/2016 09:16   Dg Chest Port 1 View  Result Date: 02/25/2016 CLINICAL DATA:  Chronic renal insufficiency stage IV with superimposed acute renal failure requiring dialysis. , respiratory failure, history of asthma, diabetes EXAM: PORTABLE CHEST 1 VIEW COMPARISON:  Portable chest x-ray of February 25, 2016 at 6:29 a.m. FINDINGS: The endotracheal tube tip has been and VATS and now all lies approximately 2.5 cm above the carina. The lungs are well-expanded. The interstitial markings are mildly prominent. The cardiac silhouette is enlarged. There is are small bilateral pleural effusions. The pulmonary vascularity is mildly engorged centrally. The esophagogastric tube proximal port is  visible in projects in the region of the  gastric cardia. The dual-lumen dialysis catheter tip projects at the cavoatrial junction. The left subclavian venous catheter tip projects over the midportion of the SVC. External pacemaker defibrillator pads are present. There is aortic arch calcification. IMPRESSION: Interval advancement of the endotracheal tube such that the tip now lies 2.5 cm above the carina. Mild pulmonary interstitial edema and small bilateral pleural effusions. Thoracic aortic atherosclerosis. Electronically Signed   By: David  Martinique M.D.   On: 02/25/2016 09:19   Dg Chest Port 1 View  Result Date: 02/25/2016 CLINICAL DATA:  Acute renal failure, intubated patient, history of asthma, diabetes EXAM: PORTABLE CHEST 1 VIEW COMPARISON:  Portable chest x-ray of February 23, 2016 FINDINGS: The lungs are mildly hyperinflated. There is a persistent right pleural effusion layering posteriorly. There is increased density in the left lower lobe with improved density in the right lower lobe. External pacemaker defibrillator pads are present. The cardiac silhouette is top-normal in size. The pulmonary vascularity remains mildly engorged but has improved since yesterday's study. The endotracheal tube tip lies above the level of the clavicular heads. This is approximately 7.5 cm above the carina. The large caliber dual-lumen dialysis catheter tip projects at or just above the cavoatrial junction. IMPRESSION: Decreased pulmonary interstitial edema today. Persistent left lower lobe atelectasis and moderate size right pleural effusion. Thoracic aortic atherosclerosis. High positioning of the endotracheal tube. Advancement by 4 cm is recommended. These results will be called to the ordering clinician or representative by the Radiologist Assistant, and communication documented in the PACS or zVision Dashboard. Electronically Signed   By: David  Martinique M.D.   On: 02/25/2016 08:15     Medications:   . sodium chloride    . feeding supplement (VITAL HIGH  PROTEIN) 1,000 mL (02/26/16 0600)  . norepinephrine (LEVOPHED) Adult infusion Stopped (02/25/16 1405)  . propofol (DIPRIVAN) infusion 15 mcg/kg/min (02/26/16 0645)   . sodium chloride   Intravenous Once  . amiodarone  200 mg Oral Daily  . arformoterol  15 mcg Nebulization BID  . budesonide (PULMICORT) nebulizer solution  0.25 mg Nebulization BID  . chlorhexidine gluconate (MEDLINE KIT)  15 mL Mouth Rinse BID  . citalopram  10 mg Per Tube Daily  . diltiazem  120 mg Oral Q12H  . epoetin (EPOGEN/PROCRIT) injection  10,000 Units Intravenous Q T,Th,Sa-HD  . famotidine  20 mg Oral Daily  . feeding supplement (PRO-STAT SUGAR FREE 64)  30 mL Per Tube Daily  . insulin aspart  0-5 Units Subcutaneous QHS  . insulin aspart  0-9 Units Subcutaneous TID WC  . mouth rinse  15 mL Mouth Rinse 10 times per day  . metoprolol tartrate  25 mg Oral Q12H  . pravastatin  20 mg Oral q1800  . senna-docusate  1 tablet Oral BID  . sodium chloride flush  3 mL Intravenous Q12H  . Warfarin - Pharmacist Dosing Inpatient   Does not apply q1800   sodium chloride, sodium chloride, albuterol, bisacodyl, fentaNYL (SUBLIMAZE) injection, guaiFENesin-dextromethorphan, midazolam, nitroGLYCERIN, [DISCONTINUED] ondansetron **OR** ondansetron (ZOFRAN) IV, senna-docusate  Assessment/ Plan:  Ms. Joan Erickson is a 83 y.o. 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 ESRD: seen and examined on hemodialysis. Tolerating treatment well. UF goal of 2.5 litres.  With flash pulmonary edema. Will most likely  need daily dialysis until fluid is removed.   2. Hypertension: off vasopressors. Monitor blood pressure.   3. Anemia of chronic kidney disease: hemogloibn 6.5 - PRBC transfusion ordered for today.  -  epo with TTS treatments.   4. Secondary Hyperparathyroidism: phos and calcium at goal. Not currently on binders.   5. Hyponatremia: secondary to chronic kidney disease.    LOS: [place], [place] 2/7/201811:08 AM

## 2016-02-26 NOTE — Progress Notes (Signed)
Pre hd assessment  

## 2016-02-26 NOTE — Progress Notes (Signed)
ANTICOAGULATION CONSULT NOTE - Follow Up Consult  Pharmacy Consult for warfarin  Indication: atrial fibrillation   No Known Allergies  Patient Measurements: Height: 5' (152.4 cm) Weight: 136 lb 7.4 oz (61.9 kg) IBW/kg (Calculated) : 45.5  Vital Signs: Temp: 97.6 F (36.4 C) (02/07 1310) Temp Source: Axillary (02/07 1310) BP: 97/52 (02/07 1315) Pulse Rate: 69 (02/07 1315)  Labs:  Recent Labs  02/24/16 0428 02/25/16 0453 02/25/16 0908 02/26/16 0455  HGB  --   --  7.2* 6.5*  HCT  --   --  21.8* 19.8*  PLT  --   --  221 231  LABPROT 21.2* 25.5*  --  40.7*  INR 1.81 2.28  --  4.09*  CREATININE  --   --  5.75* 4.80*     Estimated Creatinine Clearance: 7.4 mL/min (by C-G formula based on SCr of 4.8 mg/dL (H)).   Medical History: Past Medical History:  Diagnosis Date  . Asthma   . Atrial fibrillation ([place])   . GERD (gastroesophageal reflux disease)   . History of colon polyps   . Hypercholesterolemia   . Hypertension   . Hypogammaglobulinemia (H[facility])    IgA  . Warfarin anticoagulation   . Xanthogranulomatous pyelonephritis    s/p right nephrectomy    Medications:  Prescriptions Prior to Admission  Medication Sig Dispense Refill Last Dose  . acetaminophen (TYLENOL) 500 MG tablet Take 500 mg by mouth every 6 (six) hours as needed.   prn at prn  . ADVAIR DISKUS 250-50 MCG/DOSE AEPB INHALE 1 PUFF BY MOUTH TWICE A DAY 60 each 5 02/10/2016 at 2100  . chlorthalidone (HYGROTON) 25 MG tablet Take 0.5 tablets (12.5 mg total) by mouth daily. 30 tablet 2 02/01/2016 at 0900  . Cholecalciferol (VITAMIN D) 2000 UNITS tablet Take 2,000 Units by mouth daily.   01/29/2016 at 0900  . citalopram (CELEXA) 10 MG tablet Take 1 tablet (10 mg total) by mouth daily. 90 tablet 1 01/28/2016 at 0900  . diltiazem (TIAZAC) 360 MG 24 hr capsule Take 1 capsule (360 mg total) by mouth daily. 90 capsule 1 02/09/2016 at 0900  . finasteride (PROSCAR) 5 MG tablet    01/24/2016 at 0900  . fluticasone  (FLONASE) 50 MCG/ACT nasal spray USE 2 SPRAYS IN EACH NOSTRIL ONCE A DAY 16 g 5 02/10/2016 at Unknown time  . montelukast (SINGULAIR) 10 MG tablet Take 1 tablet (10 mg total) by mouth daily. 90 tablet 1 01/29/2016 at 0900  . pravastatin (PRAVACHOL) 20 MG tablet TAKE 1 TABLET (20 MG TOTAL) BY MOUTH DAILY. 30 tablet 8 02/10/2016 at 2100  . PROAIR HFA 108 (90 Base) MCG/ACT inhaler INHALE 2 PUFF EVERY SIX HOURS AS NEEDED 8.5 Inhaler 2 02/10/2016 at 0900  . TRADJENTA 5 MG TABS tablet TAKE 1 TABLET (5 MG TOTAL) BY MOUTH DAILY. 30 tablet 11 02/04/2016 at 0900  . warfarin (COUMADIN) 1 MG tablet Take 1 mg by mouth at bedtime. Pt takes 0.5 t in addition to the 5 mg dose  4 02/10/2016 at 2100  . warfarin (COUMADIN) 5 MG tablet Take 5 mg by mouth at bedtime. Pt takes 5 mg dose along with 0.5 of a 1 mg tab qhs   02/10/2016 at 2100  . Blood Glucose Monitoring Suppl (ONE TOUCH ULTRA SYSTEM KIT) W/DEVICE KIT 1 kit by Does not apply route once. 1 each 0 Taking  . eplerenone (INSPRA) 25 MG tablet TAKE 1 TABLET (25 MG TOTAL) BY MOUTH ONCE DAILY FOR 14 DAYS.  0 Taking  . fluticasone (FLONASE) 50 MCG/ACT nasal spray USE 2 SPRAYS IN EACH NOSTRIL ONCE A DAY 16 g 1 Taking  . ONE TOUCH ULTRA TEST test strip CHECK BLOOD SUGAR 2 TIMES A DAY DX: E11.9 100 each 3 Taking   Scheduled:  . sodium chloride   Intravenous Once  . amiodarone  200 mg Oral Daily  . arformoterol  15 mcg Nebulization BID  . budesonide (PULMICORT) nebulizer solution  0.25 mg Nebulization BID  . chlorhexidine gluconate (MEDLINE KIT)  15 mL Mouth Rinse BID  . citalopram  10 mg Per Tube Daily  . diltiazem  120 mg Oral Q12H  . epoetin (EPOGEN/PROCRIT) injection  10,000 Units Intravenous Q T,Th,Sa-HD  . famotidine  20 mg Oral Daily  . feeding supplement (PRO-STAT SUGAR FREE 64)  30 mL Per Tube Daily  . furosemide  10 mg Intravenous Once  . insulin aspart  0-5 Units Subcutaneous QHS  . insulin aspart  0-9 Units Subcutaneous TID WC  . mouth rinse  15 mL Mouth  Rinse 10 times per day  . metoprolol tartrate  25 mg Oral Q12H  . pravastatin  20 mg Oral q1800  . senna-docusate  1 tablet Oral BID  . sodium chloride flush  3 mL Intravenous Q12H  . Warfarin - Pharmacist Dosing Inpatient   Does not apply q1800    Assessment: Pharmacy consulted to dose and monitor warfarin therapy in this 83 year old woman with atrial fibrillation. Warfarin was held since 1/29 and patient is now s/p permcath placement. Home warfarin dose is 5.77m  daily.   2/2  INR   1.29   Warfarin 5 mg 2/3  INR   1.24      5 mg 2/4  INR   1.43      5 mg 2/5: INR    1.81     570m 2/6 INR     2.28     8m4m  2/7 INR     4.09   Goal of Therapy:  INR 2-3 Monitor platelets by anticoagulation protocol: Yes   Plan:  INR subtherapeutic at 1.29. Patient had warfarin held since the 1/28. Will start patient with a load dose of warfarin 8mg97mday. Suspect that warfarin will need to be decreased from home dose as patient started on amiodarone in the hospital. Patient is currently receiving DVT ppx with heparin SQ at this time till INR comes up.   2/7 Hgb 6.5 and INR 4.09. Will hold warfarin tonight and reassess.   KellLoree FeearmD  02/26/2016 2:09 PM

## 2016-02-26 NOTE — Progress Notes (Signed)
Start of hd 

## 2016-02-26 NOTE — Progress Notes (Signed)
I have reviewed and agree with [facility] student Stacey's charting.

## 2016-02-26 NOTE — Care Management (Signed)
Email received from [facility]/dialysis liaison Alda Lea that patient is set for T, TH, Sat dialysis with [place]. Patient back to ICU today- Cheryl notified by [facility].

## 2016-02-26 NOTE — Progress Notes (Signed)
This note also relates to the following rows which could not be included: Pulse Rate - Cannot attach notes to unvalidated device data Resp - Cannot attach notes to unvalidated device data SpO2 - Cannot attach notes to unvalidated device data End Tidal CO2 (EtCO2) - Cannot attach notes to unvalidated device data  Post hd vitals

## 2016-02-26 NOTE — Progress Notes (Signed)
[facility] for electrolyte, constipation, glucose management   No Known Allergies  Patient Measurements: Height: 5' (152.4 cm) Weight: 136 lb 7.4 oz (61.9 kg) IBW/kg (Calculated) : 45.5   Vital Signs: Temp: 99.1 F (37.3 C) (02/07 1845) Temp Source: Oral (02/07 1845) BP: 87/52 (02/07 1900) Pulse Rate: 82 (02/07 1900) Intake/Output from previous day: 02/06 0701 - 02/07 0700 In: 524.3 [NG/GT:524.3] Out: 2500  Intake/Output from this shift: No intake/output data recorded.  Labs:  Recent Labs  02/25/16 0908  02/25/16 1743 02/26/16 0455 02/26/16 1821  WBC 5.3  --   --  9.0  --   HGB 7.2*  --   --  6.5*  --   HCT 21.8*  --   --  19.8*  --   PLT 221  --   --  231  --   CREATININE 5.75*  --   --  4.80*  --   MG  --   < > 2.0 2.2 1.9  PHOS  --   < > 3.5 2.2* 1.3*  < > = values in this interval not displayed. Estimated Creatinine Clearance: 7.4 mL/min (by C-G formula based on SCr of 4.8 mg/dL (H)).   Microbiology: Recent Results (from the past 720 hour(s))  Blood culture (routine x 2)     Status: Abnormal   Collection Time: 02/09/2016 12:31 PM  Result Value Ref Range Status   Specimen Description BLOOD RIGHT AC  Final   Special Requests   Final    BOTTLES DRAWN AEROBIC AND ANAEROBIC AER 11ML ANA 8ML   Culture  Setup Time   Final    GRAM POSITIVE COCCI AEROBIC BOTTLE ONLY CRITICAL RESULT CALLED TO, READ BACK BY AND VERIFIED WITH:  [place] AT [id number] 02/02/2016 SDR    Culture (A)  Final    FACKLAMIA HOMINIS Standardized susceptibility testing for this organism is not available. Performed at [facility], [place] [address] [place],  [address]    Report Status 02/17/2016 FINAL  Final  Blood Culture ID Panel (Reflexed)     Status: None   Collection Time: 02/16/2016 12:31 PM  Result Value Ref Range Status   Enterococcus species NOT DETECTED NOT DETECTED Final   Listeria monocytogenes NOT DETECTED NOT DETECTED Final    Staphylococcus species NOT DETECTED NOT DETECTED Final   Staphylococcus aureus NOT DETECTED NOT DETECTED Final   Streptococcus species NOT DETECTED NOT DETECTED Final   Streptococcus agalactiae NOT DETECTED NOT DETECTED Final   Streptococcus pneumoniae NOT DETECTED NOT DETECTED Final   Streptococcus pyogenes NOT DETECTED NOT DETECTED Final   Acinetobacter baumannii NOT DETECTED NOT DETECTED Final   Enterobacteriaceae species NOT DETECTED NOT DETECTED Final   Enterobacter cloacae complex NOT DETECTED NOT DETECTED Final   Escherichia coli NOT DETECTED NOT DETECTED Final   Klebsiella oxytoca NOT DETECTED NOT DETECTED Final   Klebsiella pneumoniae NOT DETECTED NOT DETECTED Final   Proteus species NOT DETECTED NOT DETECTED Final   Serratia marcescens NOT DETECTED NOT DETECTED Final   Haemophilus influenzae NOT DETECTED NOT DETECTED Final   Neisseria meningitidis NOT DETECTED NOT DETECTED Final   Pseudomonas aeruginosa NOT DETECTED NOT DETECTED Final   Candida albicans NOT DETECTED NOT DETECTED Final   Candida glabrata NOT DETECTED NOT DETECTED Final   Candida krusei NOT DETECTED NOT DETECTED Final   Candida parapsilosis NOT DETECTED NOT DETECTED Final   Candida tropicalis NOT DETECTED NOT DETECTED Final  Blood culture (routine x  2)     Status: None   Collection Time: 02/10/2016 12:32 PM  Result Value Ref Range Status   Specimen Description BLOOD LEFT HAND  Final   Special Requests   Final    BOTTLES DRAWN AEROBIC AND ANAEROBIC AER 13ML ANA 13ML   Culture NO GROWTH 5 DAYS  Final   Report Status 02/16/2016 FINAL  Final  MRSA PCR Screening     Status: None   Collection Time: 01/31/2016 10:32 PM  Result Value Ref Range Status   MRSA by PCR NEGATIVE NEGATIVE Final    Comment:        The GeneXpert MRSA Assay (FDA approved for NASAL specimens only), is one component of a comprehensive MRSA colonization surveillance program. It is not intended to diagnose MRSA infection nor to guide  or monitor treatment for MRSA infections.   Culture, expectorated sputum-assessment     Status: None   Collection Time: 01/23/2016  3:24 PM  Result Value Ref Range Status   Specimen Description SPUTUM  Final   Special Requests Normal  Final   Sputum evaluation   Final    Sputum specimen not acceptable for testing.  Please recollect.   RESULT CALLED TO, READ BACK BY AND VERIFIED WITH: Champ Mungo 01/21/2016 1608 KLW    Report Status 02/04/2016 FINAL  Final  Culture, blood (single) w Reflex to ID Panel     Status: None   Collection Time: 02/17/16  2:45 PM  Result Value Ref Range Status   Specimen Description BLOOD RIGHT ANTECUBITAL  Final   Special Requests   Final    BOTTLES DRAWN AEROBIC AND ANAEROBIC ANA 8CC AER [place]   Culture NO GROWTH 5 DAYS  Final   Report Status 02/22/2016 FINAL  Final  Culture, blood (Routine X 2) w Reflex to ID Panel     Status: None   Collection Time: 02/18/16  6:12 AM  Result Value Ref Range Status   Specimen Description BLOOD R HAND  Final   Special Requests BOTTLES DRAWN AEROBIC AND ANAEROBIC 1ML  Final   Culture NO GROWTH 5 DAYS  Final   Report Status 02/23/2016 FINAL  Final  Culture, blood (Routine X 2) w Reflex to ID Panel     Status: None   Collection Time: 02/18/16  6:12 AM  Result Value Ref Range Status   Specimen Description BLOOD LEFT HAND  Final   Special Requests BOTTLES DRAWN AEROBIC AND ANAEROBIC 4ML  Final   Culture NO GROWTH 5 DAYS  Final   Report Status 02/23/2016 FINAL  Final    Medical History: Past Medical History:  Diagnosis Date  . Asthma   . Atrial fibrillation ([place])   . GERD (gastroesophageal reflux disease)   . History of colon polyps   . Hypercholesterolemia   . Hypertension   . Hypogammaglobulinemia (H[facility])    IgA  . Warfarin anticoagulation   . Xanthogranulomatous pyelonephritis    s/p right nephrectomy   Assessment: 84yoF with CKD on HD. Patient also on warfarin therapy for Afib.   Plan:  1. Electrolytes:  Phosphorus 1.3. Will replace with Potassium Phos 33mmol (due to patient have elevated Phos on admission). Will recheck in the AM and give additional replace if needed.   2. Constipation: LBM today. Receiving senna-docusate 1 tab BID.   3. Glucose management: Patient at goal. Will reassess in the AM.   Pernell Dupre, PharmD 02/26/2016,7:21 PM

## 2016-02-26 NOTE — Care Management (Addendum)
Transferred back to icu due to respiratory failure and need for intubation.  Joan Erickson with [facility]

## 2016-02-26 NOTE — Progress Notes (Signed)
[place] [facility] [place] NAME: Joan Erickson    MR#:  [id number]  DATE OF BIRTH:  Aug 11, 1931  SUBJECTIVE:  Patient is Very awake and alert. Rate controlled on amiodarone and Cardizem drip Initially (03/01/2016) had respi failure after getting anesthesia meds while having permacath, but gradually improved. Also had A fib RVR, improved with amio drip and changed to oral. She had significant hypoxia on ambulation and required 6 ltr oxygen. Noted to have some pleural effusion on Xray chest.  Overnight (02/25/16) she had worsening on the respiratory status and noted severe hypoxia on the morning so electively intubated in ICU and noted to have flash pulmonary edema, hemodialysis was done in ICU  and patient remains on ventilator.  receiving second HD again, to take more fluids out. Hb noted to be low.  REVIEW OF SYSTEMS:   Review of Systems  Unable to perform ROS: Intubated   Tolerating Diet:yes  DRUG ALLERGIES:  No Known Allergies  VITALS:  Blood pressure (P) 110/61, pulse (P) 86, temperature (P) 97.6 F (36.4 C), temperature source (P) Axillary, resp. rate 18, height 5' (1.524 m), weight 61.9 kg (136 lb 7.4 oz), SpO2 (P) 100 %.  PHYSICAL EXAMINATION:   Physical Exam  GENERAL:  83 y.o.-year-old patient lying in the bed with no acute distress.  EYES: Pupils equal, round, reactive to light and accommodation. No scleral icterus.  HEENT: Head atraumatic, normocephalic. Oropharynx and nasopharynx clear.  NECK:  Supple, no jugular venous distention. No thyroid enlargement, no tenderness.  LUNGS: Normal breath sounds bilaterally, no wheezing, Some crepitations. No use of accessory muscles of respiration. ETT in place, on vent support. CARDIOVASCULAR: Irregularly irregular No murmurs, rubs, or gallops.  ABDOMEN: Soft, nontender, nondistended. Bowel sounds present. No organomegaly or mass.  EXTREMITIES: No cyanosis, clubbing or edema b/l.     NEUROLOGIC: Patient is currently on ventilator support.  Off sedation today, able to communicate with me by writing down on a paper, completely alert and oriented. Moves all 4 limbs. PSYCHIATRIC:  patient is  Alert and oriented.  SKIN: No obvious rash, lesion, or ulcer.  LABORATORY PANEL:  CBC  Recent Labs Lab 02/26/16 0455  WBC 9.0  HGB 6.5*  HCT 19.8*  PLT 231    Chemistries   Recent Labs Lab 02/26/16 0455  NA 134*  K 3.7  CL 98*  CO2 29  GLUCOSE 140*  BUN 53*  CREATININE 4.80*  CALCIUM 7.8*  MG 2.2   Cardiac Enzymes No results for input(s): TROPONINI in the last 168 hours. RADIOLOGY:  Dg Chest 1 View  Result Date: 02/26/2016 CLINICAL DATA:  Acute respiratory failure. EXAM: CHEST 1 VIEW COMPARISON:  02/25/2016 FINDINGS: Endotracheal tube, central venous catheters and NG tube all appear in good position. Chronic cardiomegaly. Small bilateral pleural effusions. Pulmonary vascularity is normal. IMPRESSION: No significant change.  Small bilateral pleural effusions. Electronically Signed   By: Lorriane Shire M.D.   On: 02/26/2016 07:20   Dg Abd 1 View  Result Date: 02/25/2016 CLINICAL DATA:  Status post orogastric tube placement EXAM: ABDOMEN - 1 VIEW COMPARISON:  Portable chest x-ray of today's date FINDINGS: The orogastric tube tip and proximal port lie in the region of the gastric cardia. There is patchy interstitial density at the left lung base. The bowel gas pattern is unremarkable. There surgical clips in the left upper quadrant of the abdomen. IMPRESSION: The orogastric tube tip in proximal port lie in the region of the  gastric cardia. Advancement by 5-10 cm would be useful. Electronically Signed   By: David  Martinique M.D.   On: 02/25/2016 09:16   Dg Chest Port 1 View  Result Date: 02/25/2016 CLINICAL DATA:  Chronic renal insufficiency stage IV with superimposed acute renal failure requiring dialysis. , respiratory failure, history of asthma, diabetes EXAM: PORTABLE CHEST  1 VIEW COMPARISON:  Portable chest x-ray of February 25, 2016 at 6:29 a.m. FINDINGS: The endotracheal tube tip has been and VATS and now all lies approximately 2.5 cm above the carina. The lungs are well-expanded. The interstitial markings are mildly prominent. The cardiac silhouette is enlarged. There is are small bilateral pleural effusions. The pulmonary vascularity is mildly engorged centrally. The esophagogastric tube proximal port is visible in projects in the region of the gastric cardia. The dual-lumen dialysis catheter tip projects at the cavoatrial junction. The left subclavian venous catheter tip projects over the midportion of the SVC. External pacemaker defibrillator pads are present. There is aortic arch calcification. IMPRESSION: Interval advancement of the endotracheal tube such that the tip now lies 2.5 cm above the carina. Mild pulmonary interstitial edema and small bilateral pleural effusions. Thoracic aortic atherosclerosis. Electronically Signed   By: David  Martinique M.D.   On: 02/25/2016 09:19   Dg Chest Port 1 View  Result Date: 02/25/2016 CLINICAL DATA:  Acute renal failure, intubated patient, history of asthma, diabetes EXAM: PORTABLE CHEST 1 VIEW COMPARISON:  Portable chest x-ray of February 23, 2016 FINDINGS: The lungs are mildly hyperinflated. There is a persistent right pleural effusion layering posteriorly. There is increased density in the left lower lobe with improved density in the right lower lobe. External pacemaker defibrillator pads are present. The cardiac silhouette is top-normal in size. The pulmonary vascularity remains mildly engorged but has improved since yesterday's study. The endotracheal tube tip lies above the level of the clavicular heads. This is approximately 7.5 cm above the carina. The large caliber dual-lumen dialysis catheter tip projects at or just above the cavoatrial junction. IMPRESSION: Decreased pulmonary interstitial edema today. Persistent left lower lobe  atelectasis and moderate size right pleural effusion. Thoracic aortic atherosclerosis. High positioning of the endotracheal tube. Advancement by 4 cm is recommended. These results will be called to the ordering clinician or representative by the Radiologist Assistant, and communication documented in the PACS or zVision Dashboard. Electronically Signed   By: David  Martinique M.D.   On: 02/25/2016 08:15   ASSESSMENT AND PLAN:  Joan Erickson  is a 83 y.o. female with a known history of A. fib, hypertension, hyperlipidemia, CKD stage 4 and asthma. The patient was treated for pneumonia with antibiotics a month ago by her primary care doctor. She has been feeling increasingly weak and shortness of breath on exertion for the past few days. Started to have a palpitation today and feels cold. Her PCP told her that her heart rate was fast and sent patient to ED.   *Acute hypoxic respiratory failure with hypercapnia secondary to sedation ( on 02/24/2016) for procedure and poor inspiratory effort   Again on 02/25/16 - due to flash pulmonary edema and fluid overload. Initially Clinically improved status post hemodialysis   required 6 ltr oxygen via nasal canula on walking on 02/24/16, so held discharge.   On 02/25/16 early morning- intubated, nephrology did hemodialysis to remove some fluid , again HD today.   Plan is to wean off vent , if she can tolerate. Currently off sedation.  * Acute on chrnicA. fib with RVR with  bradycardia- sick sinus syndrome Cardizem and amiodarone drip discontinued -Resume Coumadin- dosing per pharmacy. - ultrasound with no evidence of DVT and upper extremity  -Pauses were noticed while patient was on amiodarone drip, IV discontinued Continue Cardizem CD 240 mg by mouth  Oral amio and metoprolol as per cardio.   INR is high so hold coumadin today.  *SVT-resolved -Status post rapid response and adenosine  * ac on ch anemia- due to CKD   Need Blood transfusion as Hb 6.5 (02/26/16)   Give Lasix  one time dose with transfusion.  * Pneumonia - Ruled out - normal procalcitonin - stopped Abx - ID c/s as blood c/s growing GPR - likely false +  repeat cx negative.  *Elevated troponin, due to  A. fib and renal failure, neg serial troponins and cardiology following.   * DVT prophylaxis   On coumadin now.  *Acute renal failure /Hyponatremia: Na improved from 125-131--134  nephro following.    Now ESRD, permacath placed, started on HD.   Patient had permacath placement 03/01/2016  *Diabetes: sliding scale. Hold Tradjenta  *Insomnia Benadryl as needed   home health PT  Case discussed with Care Management/Social Worker. Management plans discussed with the patient,  daughter and they are in agreement.   CODE STATUS: full  DVT Prophylaxis: heparin  TOTAL CRITICAL CARE TIME TAKING CARE OF THIS PATIENT: 2minutes.  >50% time spent on counselling and coordination of care   POSSIBLE D/C IN 2-3 DAYS, DEPENDING ON CLINICAL CONDITION.  Note: This dictation was prepared with Dragon dictation along with smaller phrase technology. Any transcriptional errors that result from this process are unintentional.  Vaughan Basta M.D on 02/26/2016 at 1:12 PM  Between 7am to 6pm - Pager - [phone]  After 6pm go to [email or web address] - password [email or web address]  Office  [phone]  CC: Primary care physician; Einar Pheasant, MD

## 2016-02-26 NOTE — Progress Notes (Signed)
[facility] Progess Note    ASSESSMENT/PLAN   Summary:  7 F with history of CAF, asthma, CKD admitted 02/12/2016 with SOB and atrial flutter. Admitted with renal failure, necessitating dialysis. Course has been, complicated by atrial fibrillation with RVR, and volume overload with pulmonary edema and volume overload requiring intubation.  PULMONARY A:Acute hypoxic respiratory failure/respiratory distress respiratory parameters are improved since doing dialysis.. -Intubated 02/25/16 -Review of chest x-ray images. 2/7 shows continued pulmonary edema, improved from previous This appears secondary to volume overload. -Vent settings PRvC/18/450/5/60% -ABG 7.53/38/89/31.8. Consistent with metabolic and respiratory alkalosis P:   -Continue vent support today, we'll wean as tolerated and extubate if tolerated.   CARDIOVASCULAR A: Acute on chronicA. fib with RVR with bradycardia- sick sinus syndrome P:  -Cardizem 240 mg by mouth daily, amiodarone by mouth. -Metoprolol by mouth daily. -Continue anticoagulation for atrial fibrillation.  RENAL A:  End-stage renal disease, history of right nephrectomy. P:   -Dialysis per nephro  GASTROINTESTINAL A:  -Continue tube feeds.  HEMATOLOGIC A: Anemia-chronic. Hemoglobin 6.5 today, INR 4.09  -We'll transfuse 1 unit -Hold Coumadin.  INFECTIOUS A:  -- P:    Micro/culture results:  BCx2 -- UC -- Sputum--  Antibiotics: --  ENDOCRINE A: Sliding-scale insulin, blood sugar appears adequately controlled.  NEUROLOGIC A:  -Sedation.   MAJOR EVENTS/TEST RESULTS:   Best Practices  DVT Prophylaxis: Coumadin GI Prophylaxis: famotidine.    ---------------------------------------   ----------------------------------------   Name: Joan Erickson MRN: [id number] DOB: [birth year]    ADMISSION DATE:  01/30/2016    SUBJECTIVE:   Pt currently on the ventilator, can not provide history or review of systems.      VITAL SIGNS: Temp:  [94 F (34.4 C)-99.2 F (37.3 C)] 98.8 F (37.1 C) (02/07 0500) Pulse Rate:  [62-136] 88 (02/07 0600) Resp:  [0-31] 18 (02/07 0600) BP: (85-137)/(45-108) 106/68 (02/07 0600) SpO2:  [85 %-100 %] 97 % (02/07 0600) FiO2 (%):  [30 %-60 %] 30 % (02/07 0431) Weight:  [61.9 kg (136 lb 7.4 oz)] 61.9 kg (136 lb 7.4 oz) (02/07 0108) HEMODYNAMICS:   VENTILATOR SETTINGS: Vent Mode: PRVC FiO2 (%):  [30 %-60 %] 30 % Set Rate:  [18 bmp] 18 bmp Vt Set:  [450 mL] 450 mL PEEP:  [5 cmH20] 5 cmH20 INTAKE / OUTPUT:  Intake/Output Summary (Last 24 hours) at 02/26/16 0818 Last data filed at 02/26/16 0600  Gross per 24 hour  Intake           524.25 ml  Output             2500 ml  Net         -1975.75 ml    PHYSICAL EXAMINATION: Physical Examination:   VS: BP 106/68   Pulse 88   Temp 98.8 F (37.1 C) (Oral)   Resp 18   Ht 5' (1.524 m)   Wt 61.9 kg (136 lb 7.4 oz)   SpO2 97%   BMI 26.65 kg/m   General Appearance: No distress , on vent.  Neuro:without focal findings, mental status reduced, unresponsive.  HEENT: PERRLA, EOM intact. Pulmonary: Bilateral crepitations.  CardiovascularNormal S1,S2.  No m/r/g.   Abdomen: Benign, Soft, non-tender. Renal:  No costovertebral tenderness  GU:  Not performed at this time. Endocrine: No evident thyromegaly. Skin:   warm, no rashes, no ecchymosis  Extremities: normal, no cyanosis, clubbing.   LABS:   LABORATORY PANEL:   CBC  Recent Labs Lab 02/26/16 0455  WBC  9.0  HGB 6.5*  HCT 19.8*  PLT 231    Chemistries   Recent Labs Lab 02/26/16 0455  NA 134*  K 3.7  CL 98*  CO2 29  GLUCOSE 140*  BUN 53*  CREATININE 4.80*  CALCIUM 7.8*  MG 2.2  PHOS 2.2*     Recent Labs Lab 02/25/16 1330 02/25/16 1627 02/25/16 2005 02/26/16 0008 02/26/16 0401 02/26/16 0800  GLUCAP 84 81 130* 126* 144* 127*    Recent Labs Lab 02/25/16 0630 02/25/16 1008 02/26/16 0430  PHART 7.01* 7.41 7.53*  PCO2ART  >120* 43 38  PO2ART 80* 207* 89   No results for input(s): AST, ALT, ALKPHOS, BILITOT, ALBUMIN in the last 168 hours.  Cardiac Enzymes No results for input(s): TROPONINI in the last 168 hours.  RADIOLOGY:  Dg Chest 1 View  Result Date: 02/26/2016 CLINICAL DATA:  Acute respiratory failure. EXAM: CHEST 1 VIEW COMPARISON:  02/25/2016 FINDINGS: Endotracheal tube, central venous catheters and NG tube all appear in good position. Chronic cardiomegaly. Small bilateral pleural effusions. Pulmonary vascularity is normal. IMPRESSION: No significant change.  Small bilateral pleural effusions. Electronically Signed   By: Lorriane Shire M.D.   On: 02/26/2016 07:20   Dg Abd 1 View  Result Date: 02/25/2016 CLINICAL DATA:  Status post orogastric tube placement EXAM: ABDOMEN - 1 VIEW COMPARISON:  Portable chest x-ray of today's date FINDINGS: The orogastric tube tip and proximal port lie in the region of the gastric cardia. There is patchy interstitial density at the left lung base. The bowel gas pattern is unremarkable. There surgical clips in the left upper quadrant of the abdomen. IMPRESSION: The orogastric tube tip in proximal port lie in the region of the gastric cardia. Advancement by 5-10 cm would be useful. Electronically Signed   By: David  Martinique M.D.   On: 02/25/2016 09:16   Dg Chest Port 1 View  Result Date: 02/25/2016 CLINICAL DATA:  Chronic renal insufficiency stage IV with superimposed acute renal failure requiring dialysis. , respiratory failure, history of asthma, diabetes EXAM: PORTABLE CHEST 1 VIEW COMPARISON:  Portable chest x-ray of February 25, 2016 at 6:29 a.m. FINDINGS: The endotracheal tube tip has been and VATS and now all lies approximately 2.5 cm above the carina. The lungs are well-expanded. The interstitial markings are mildly prominent. The cardiac silhouette is enlarged. There is are small bilateral pleural effusions. The pulmonary vascularity is mildly engorged centrally. The  esophagogastric tube proximal port is visible in projects in the region of the gastric cardia. The dual-lumen dialysis catheter tip projects at the cavoatrial junction. The left subclavian venous catheter tip projects over the midportion of the SVC. External pacemaker defibrillator pads are present. There is aortic arch calcification. IMPRESSION: Interval advancement of the endotracheal tube such that the tip now lies 2.5 cm above the carina. Mild pulmonary interstitial edema and small bilateral pleural effusions. Thoracic aortic atherosclerosis. Electronically Signed   By: David  Martinique M.D.   On: 02/25/2016 09:19   Dg Chest Port 1 View  Result Date: 02/25/2016 CLINICAL DATA:  Acute renal failure, intubated patient, history of asthma, diabetes EXAM: PORTABLE CHEST 1 VIEW COMPARISON:  Portable chest x-ray of February 23, 2016 FINDINGS: The lungs are mildly hyperinflated. There is a persistent right pleural effusion layering posteriorly. There is increased density in the left lower lobe with improved density in the right lower lobe. External pacemaker defibrillator pads are present. The cardiac silhouette is top-normal in size. The pulmonary vascularity remains mildly engorged but has  improved since yesterday's study. The endotracheal tube tip lies above the level of the clavicular heads. This is approximately 7.5 cm above the carina. The large caliber dual-lumen dialysis catheter tip projects at or just above the cavoatrial junction. IMPRESSION: Decreased pulmonary interstitial edema today. Persistent left lower lobe atelectasis and moderate size right pleural effusion. Thoracic aortic atherosclerosis. High positioning of the endotracheal tube. Advancement by 4 cm is recommended. These results will be called to the ordering clinician or representative by the Radiologist Assistant, and communication documented in the PACS or zVision Dashboard. Electronically Signed   By: David  Martinique M.D.   On: 02/25/2016 08:15        --Marda Stalker, MD.  ICU Pager: [phone] [facility] Office Number: [phone]  Patricia Pesa, M.D.  Vilinda Boehringer, M.D.  Merton Border, M.D  02/26/2016    Critical Care Attestation.  I have personally obtained a history, examined the patient, evaluated laboratory and imaging results, formulated the assessment and plan and placed orders. The Patient requires high complexity decision making for assessment and support, frequent evaluation and titration of therapies, application of advanced monitoring technologies and extensive interpretation of multiple databases. The patient has critical illness that could lead imminently to failure of 1 or more organ systems and requires the highest level of physician preparedness to intervene.  Critical Care Time devoted to patient care services described in this note is 55 minutes and is exclusive of time spent in procedures.

## 2016-02-26 NOTE — Progress Notes (Signed)
[place] for electrolyte, constipation, glucose management   No Known Allergies  Patient Measurements: Height: 5' (152.4 cm) Weight: 136 lb 7.4 oz (61.9 kg) IBW/kg (Calculated) : 45.5   Vital Signs: Temp: 97.6 F (36.4 C) (02/07 1310) Temp Source: Axillary (02/07 1310) BP: 97/52 (02/07 1315) Pulse Rate: 69 (02/07 1315) Intake/Output from previous day: 02/06 0701 - 02/07 0700 In: 524.3 [NG/GT:524.3] Out: 2500  Intake/Output from this shift: Total I/O In: -  Out: 2000 [Other:2000]  Labs:  Recent Labs  02/25/16 0908 02/25/16 1037 02/25/16 1743 02/26/16 0455  WBC 5.3  --   --  9.0  HGB 7.2*  --   --  6.5*  HCT 21.8*  --   --  19.8*  PLT 221  --   --  231  CREATININE 5.75*  --   --  4.80*  MG  --  2.3 2.0 2.2  PHOS  --  5.4* 3.5 2.2*   Estimated Creatinine Clearance: 7.4 mL/min (by C-G formula based on SCr of 4.8 mg/dL (H)).   Microbiology: Recent Results (from the past 720 hour(s))  Blood culture (routine x 2)     Status: Abnormal   Collection Time: 02/17/2016 12:31 PM  Result Value Ref Range Status   Specimen Description BLOOD RIGHT AC  Final   Special Requests   Final    BOTTLES DRAWN AEROBIC AND ANAEROBIC AER 11ML ANA 8ML   Culture  Setup Time   Final    GRAM POSITIVE COCCI AEROBIC BOTTLE ONLY CRITICAL RESULT CALLED TO, READ BACK BY AND VERIFIED WITH:  [place] AT [id number] 01/26/2016 SDR    Culture (A)  Final    FACKLAMIA HOMINIS Standardized susceptibility testing for this organism is not available. Performed at [facility], [place] [address], [place], [place] [address]    Report Status 02/17/2016 FINAL  Final  Blood Culture ID Panel (Reflexed)     Status: None   Collection Time: 02/03/2016 12:31 PM  Result Value Ref Range Status   Enterococcus species NOT DETECTED NOT DETECTED Final   Listeria monocytogenes NOT DETECTED NOT DETECTED Final   Staphylococcus species NOT DETECTED NOT DETECTED Final   Staphylococcus aureus NOT DETECTED NOT DETECTED Final   Streptococcus species NOT DETECTED NOT DETECTED Final   Streptococcus agalactiae NOT DETECTED NOT DETECTED Final   Streptococcus pneumoniae NOT DETECTED NOT DETECTED Final   Streptococcus pyogenes NOT DETECTED NOT DETECTED Final   Acinetobacter baumannii NOT DETECTED NOT DETECTED Final   Enterobacteriaceae species NOT DETECTED NOT DETECTED Final   Enterobacter cloacae complex NOT DETECTED NOT DETECTED Final   Escherichia coli NOT DETECTED NOT DETECTED Final   Klebsiella oxytoca NOT DETECTED NOT DETECTED Final   Klebsiella pneumoniae NOT DETECTED NOT DETECTED Final   Proteus species NOT DETECTED NOT DETECTED Final   Serratia marcescens NOT DETECTED NOT DETECTED Final   Haemophilus influenzae NOT DETECTED NOT DETECTED Final   Neisseria meningitidis NOT DETECTED NOT DETECTED Final   Pseudomonas aeruginosa NOT DETECTED NOT DETECTED Final   Candida albicans NOT DETECTED NOT DETECTED Final   Candida glabrata NOT DETECTED NOT DETECTED Final   Candida krusei NOT DETECTED NOT DETECTED Final   Candida parapsilosis NOT DETECTED NOT DETECTED Final   Candida tropicalis NOT DETECTED NOT DETECTED Final  Blood culture (routine x 2)     Status: None   Collection Time: 01/31/2016 12:32 PM  Result Value Ref Range Status   Specimen Description BLOOD LEFT HAND  Final   Special Requests   Final    BOTTLES DRAWN AEROBIC AND ANAEROBIC AER 13ML ANA 13ML   Culture NO GROWTH 5 DAYS  Final   Report Status 02/16/2016 FINAL  Final  MRSA PCR Screening     Status: None   Collection Time: 01/30/2016 10:32 PM  Result Value Ref Range Status   MRSA by PCR NEGATIVE NEGATIVE Final    Comment:        The GeneXpert MRSA Assay (FDA approved for NASAL specimens only), is one component of a comprehensive MRSA colonization surveillance program. It is not intended to diagnose MRSA infection nor to guide or monitor treatment for MRSA infections.   Culture,  expectorated sputum-assessment     Status: None   Collection Time: 02/12/2016  3:24 PM  Result Value Ref Range Status   Specimen Description SPUTUM  Final   Special Requests Normal  Final   Sputum evaluation   Final    Sputum specimen not acceptable for testing.  Please recollect.   RESULT CALLED TO, READ BACK BY AND VERIFIED WITH: Champ Mungo 01/28/2016 1608 KLW    Report Status 01/25/2016 FINAL  Final  Culture, blood (single) w Reflex to ID Panel     Status: None   Collection Time: 02/17/16  2:45 PM  Result Value Ref Range Status   Specimen Description BLOOD RIGHT ANTECUBITAL  Final   Special Requests   Final    BOTTLES DRAWN AEROBIC AND ANAEROBIC ANA 8CC AER Stem   Culture NO GROWTH 5 DAYS  Final   Report Status 02/22/2016 FINAL  Final  Culture, blood (Routine X 2) w Reflex to ID Panel     Status: None   Collection Time: 02/18/16  6:12 AM  Result Value Ref Range Status   Specimen Description BLOOD R HAND  Final   Special Requests BOTTLES DRAWN AEROBIC AND ANAEROBIC 1ML  Final   Culture NO GROWTH 5 DAYS  Final   Report Status 02/23/2016 FINAL  Final  Culture, blood (Routine X 2) w Reflex to ID Panel     Status: None   Collection Time: 02/18/16  6:12 AM  Result Value Ref Range Status   Specimen Description BLOOD LEFT HAND  Final   Special Requests BOTTLES DRAWN AEROBIC AND ANAEROBIC 4ML  Final   Culture NO GROWTH 5 DAYS  Final   Report Status 02/23/2016 FINAL  Final    Medical History: Past Medical History:  Diagnosis Date  . Asthma   . Atrial fibrillation ([place])   . GERD (gastroesophageal reflux disease)   . History of colon polyps   . Hypercholesterolemia   . Hypertension   . Hypogammaglobulinemia ([facility])    IgA  . Warfarin anticoagulation   . Xanthogranulomatous pyelonephritis    s/p right nephrectomy   Assessment: 84yoF with CKD on HD. Patient also on warfarin therapy for Afib.   Plan:  1. Electrolytes: Phosphorus 2.2- slightly low. At this time will not replace  electrolytes and recheck in the AM due to patient having elevated phosphorus on admission.   2. Constipation: LBM today. Receiving senna-docusate 1 tab BID.   3. Glucose management: Patient at goal. Will reassess in the AM.   Loree Fee, PharmD 02/26/2016,2:12 PM

## 2016-02-26 NOTE — Progress Notes (Signed)
Pre hd info 

## 2016-02-27 ENCOUNTER — Inpatient Hospital Stay: Payer: Medicare Other

## 2016-02-27 LAB — RENAL FUNCTION PANEL
ALBUMIN: 2.2 g/dL — AB (ref 3.5–5.0)
ANION GAP: 7 (ref 5–15)
BUN: 21 mg/dL — ABNORMAL HIGH (ref 6–20)
CALCIUM: 7.7 mg/dL — AB (ref 8.9–10.3)
CO2: 32 mmol/L (ref 22–32)
CREATININE: 1.99 mg/dL — AB (ref 0.44–1.00)
Chloride: 98 mmol/L — ABNORMAL LOW (ref 101–111)
GFR, EST AFRICAN AMERICAN: 26 mL/min — AB (ref >60)
GFR, EST NON AFRICAN AMERICAN: 22 mL/min — AB (ref >60)
Glucose, Bld: 121 mg/dL — ABNORMAL HIGH (ref 65–99)
PHOSPHORUS: 1.5 mg/dL — AB (ref 2.5–4.6)
Potassium: 3.4 mmol/L — ABNORMAL LOW (ref 3.5–5.1)
SODIUM: 137 mmol/L (ref 135–145)

## 2016-02-27 LAB — GLUCOSE, CAPILLARY
GLUCOSE-CAPILLARY: 123 mg/dL — AB (ref 65–99)
GLUCOSE-CAPILLARY: 142 mg/dL — AB (ref 65–99)
GLUCOSE-CAPILLARY: 213 mg/dL — AB (ref 65–99)
Glucose-Capillary: 155 mg/dL — ABNORMAL HIGH (ref 65–99)
Glucose-Capillary: 168 mg/dL — ABNORMAL HIGH (ref 65–99)
Glucose-Capillary: 183 mg/dL — ABNORMAL HIGH (ref 65–99)
Glucose-Capillary: 156 mg/dL — ABNORMAL HIGH (ref 65–99)

## 2016-02-27 LAB — CBC
HEMATOCRIT: 24.1 % — AB (ref 35.0–47.0)
HEMOGLOBIN: 8 g/dL — AB (ref 12.0–16.0)
MCH: 28.4 pg (ref 26.0–34.0)
MCHC: 33 g/dL (ref 32.0–36.0)
MCV: 86.3 fL (ref 80.0–100.0)
Platelets: 209 10*3/uL (ref 150–440)
RBC: 2.8 MIL/uL — AB (ref 3.80–5.20)
RDW: 17.1 % — ABNORMAL HIGH (ref 11.5–14.5)
WBC: 10.5 10*3/uL (ref 3.6–11.0)

## 2016-02-27 LAB — BASIC METABOLIC PANEL
ANION GAP: 6 (ref 5–15)
BUN: 43 mg/dL — ABNORMAL HIGH (ref 6–20)
CHLORIDE: 100 mmol/L — AB (ref 101–111)
CO2: 28 mmol/L (ref 22–32)
CREATININE: 3.73 mg/dL — AB (ref 0.44–1.00)
Calcium: 7.4 mg/dL — ABNORMAL LOW (ref 8.9–10.3)
GFR calc non Af Amer: 10 mL/min — ABNORMAL LOW (ref >60)
GFR, EST AFRICAN AMERICAN: 12 mL/min — AB (ref >60)
Glucose, Bld: 171 mg/dL — ABNORMAL HIGH (ref 65–99)
POTASSIUM: 3.4 mmol/L — AB (ref 3.5–5.1)
Sodium: 134 mmol/L — ABNORMAL LOW (ref 135–145)

## 2016-02-27 LAB — MAGNESIUM: MAGNESIUM: 1.9 mg/dL (ref 1.7–2.4)

## 2016-02-27 LAB — TYPE AND SCREEN
BLOOD PRODUCT EXPIRATION DATE: 201802081306
ISSUE DATE / TIME: 201802071755
Unit Type and Rh: 5100

## 2016-02-27 LAB — PHOSPHORUS: PHOSPHORUS: 1.9 mg/dL — AB (ref 2.5–4.6)

## 2016-02-27 LAB — PROTIME-INR
INR: 4.11 — AB
PROTHROMBIN TIME: 40.9 s — AB (ref 11.4–15.2)

## 2016-02-27 MED ORDER — POTASSIUM PHOSPHATES 15 MMOLE/5ML IV SOLN
20.0000 mmol | Freq: Once | INTRAVENOUS | Status: DC
  Filled 2016-02-27: qty 6.67

## 2016-02-27 MED ORDER — POTASSIUM PHOSPHATES 15 MMOLE/5ML IV SOLN
15.0000 mmol | Freq: Once | INTRAVENOUS | Status: AC
  Administered 2016-02-27: 15 mmol via INTRAVENOUS
  Filled 2016-02-27: qty 5

## 2016-02-27 NOTE — Progress Notes (Signed)
Joan Erickson: Joan Erickson    MR#:  [id number]  DATE OF BIRTH:  1932-01-07  SUBJECTIVE:  Patient is Very awake and alert. remains on ventilator. receiving second HD again, to take more fluids out. Family at bedside REVIEW OF SYSTEMS:   Review of Systems  Unable to perform ROS: Intubated   Tolerating Diet:  DRUG ALLERGIES:  No Known Allergies  VITALS:  Blood pressure (!) 135/119, pulse 71, temperature 99.1 F (37.3 C), resp. rate (!) 6, height 5' (1.524 m), weight 63.1 kg (139 lb 1.8 oz), SpO2 100 %.  PHYSICAL EXAMINATION:   Physical Exam  GENERAL:  83 y.o.-year-old patient lying in the bed with no acute distress.  EYES: Pupils equal, round, reactive to light and accommodation. No scleral icterus.  HEENT: Head atraumatic, normocephalic. Oropharynx and nasopharynx clear. ETT in place NECK:  Supple, no jugular venous distention. No thyroid enlargement, no tenderness.  LUNGS: Normal breath sounds bilaterally, no wheezing, Some crepitations. No use of accessory muscles of respiration. ETT in place, on vent support. CARDIOVASCULAR: Irregularly irregular No murmurs, rubs, or gallops.  ABDOMEN: Soft, nontender, nondistended. Bowel sounds present. No organomegaly or mass.  EXTREMITIES: No cyanosis, clubbing or edema b/l.    NEUROLOGIC: Patient is currently on ventilator support.  Off sedation today, able to communicate with me by writing down on a paper, completely alert and oriented. Moves all 4 limbs. PSYCHIATRIC:  patient is  Alert and oriented.  SKIN: No obvious rash, lesion, or ulcer.  LABORATORY PANEL:  CBC  Recent Labs Lab 02/27/16 0536  WBC 10.5  HGB 8.0*  HCT 24.1*  PLT 209    Chemistries   Recent Labs Lab 02/27/16 0536  NA 134*  K 3.4*  CL 100*  CO2 28  GLUCOSE 171*  BUN 43*  CREATININE 3.73*  CALCIUM 7.4*  MG 1.9   Cardiac Enzymes No results for input(s): TROPONINI in the last 168  hours. RADIOLOGY:  Dg Chest 1 View  Result Date: 02/27/2016 CLINICAL DATA:  Shortness of Breath EXAM: CHEST 1 VIEW COMPARISON:  02/26/2016 FINDINGS: Cardiac shadow remains enlarged. An endotracheal tube, nasogastric catheter and jugular dialysis catheter are again seen and stable. Left subclavian central line is again noted and stable. Bilateral pleural effusions are seen. No focal confluent infiltrate is noted. IMPRESSION: Stable small bilateral pleural effusions. Tubes and lines as described. Electronically Signed   By: Inez Catalina M.D.   On: 02/27/2016 07:07   Dg Chest 1 View  Result Date: 02/26/2016 CLINICAL DATA:  Acute respiratory failure. EXAM: CHEST 1 VIEW COMPARISON:  02/25/2016 FINDINGS: Endotracheal tube, central venous catheters and NG tube all appear in good position. Chronic cardiomegaly. Small bilateral pleural effusions. Pulmonary vascularity is normal. IMPRESSION: No significant change.  Small bilateral pleural effusions. Electronically Signed   By: Lorriane Shire M.D.   On: 02/26/2016 07:20   ASSESSMENT AND PLAN:  Joan Erickson  is a 83 y.o. female with a known history of A. fib, hypertension, hyperlipidemia, CKD stage 4 and asthma. The patient was treated for pneumonia with antibiotics a month ago by her primary care doctor. She has been feeling increasingly weak and shortness of breath on exertion for the past few days. Started to have a palpitation today and feels cold. Her PCP told her that her heart rate was fast and sent patient to ED.   *Acute hypoxic respiratory failure with hypercapnia secondary to sedation ( on 03/05/2016) for procedure  and poor inspiratory effort   Again on 02/25/16 - due to flash pulmonary edema and fluid overload. Initially Clinically improved status post hemodialysis   required 6 ltr oxygen via nasal canula on walking on 02/24/16, so held discharge.   On 02/25/16 early morning- intubated, nephrology did hemodialysis to remove some fluid , again HD today.   Plan  is to wean off vent after HD, if she can tolerate. Currently off sedation.  * Acute on chrnicA. fib with RVR with bradycardia- sick sinus syndrome Cardizem and amiodarone drip discontinued -Resume Coumadin- dosing per pharmacy. - ultrasound with no evidence of DVT and upper extremity  -Pauses were noticed while patient was on amiodarone drip, IV discontinued Continue Cardizem CD 240 mg by mouth  Oral amio and metoprolol as per cardio.   INR is high 4.11 so hold coumadin today.  *SVT-resolved -Status post rapid response and adenosine  * ac on ch anemia- due to CKD   Need Blood transfusion as Hb 6.5 (02/26/16) s/p 1 PRBC and now Hb 8.0   Give Lasix one time dose with transfusion.  * Pneumonia - Ruled out - normal procalcitonin - stopped Abx - ID c/s as blood c/s growing GPR - likely false +  repeat cx negative.  *Elevated troponin, due to  A. fib and renal failure, neg serial troponins and cardiology following.   * DVT prophylaxis   On coumadin now.  *Acute renal failure /Hyponatremia: Na improved from 125-131--134  nephro following.    Now ESRD, permacath placed, started on HD.   Patient had permacath placement 03/02/2016  *Diabetes: sliding scale. Hold Tradjenta  *Insomnia Benadryl as needed   home health PT  Case discussed with Care Management/Social Worker. Management plans discussed with the patient,  daughter and they are in agreement.   CODE STATUS: full  DVT Prophylaxis: heparin  TOTAL CRITICAL CARE TIME TAKING CARE OF THIS PATIENT: 74minutes.  >50% time spent on counselling and coordination of care   POSSIBLE D/C IN 2-3 DAYS, DEPENDING ON CLINICAL CONDITION.  Note: This dictation was prepared with Dragon dictation along with smaller phrase technology. Any transcriptional errors that result from this process are unintentional.  Max Sane M.D on 02/27/2016 at 5:16 PM  Between 7am to 6pm - Pager - [phone]  After 6pm go to [email or web address] - password  [facility]  Office  [phone]  CC: Primary care physician; Einar Pheasant, MD

## 2016-02-27 NOTE — Progress Notes (Signed)
Pre hd info 

## 2016-02-27 NOTE — Progress Notes (Signed)
Post hd assessment 

## 2016-02-27 NOTE — Progress Notes (Signed)
  End of hd 

## 2016-02-27 NOTE — Care Management (Signed)
Chancy Hurter with [facility] [employer] per request

## 2016-02-27 NOTE — Progress Notes (Signed)
Start of hd 

## 2016-02-27 NOTE — Progress Notes (Signed)
[facility] Progess Note    ASSESSMENT/PLAN   Summary:  71 F with history of CAF, asthma, CKD admitted 02/13/2016 with SOB and atrial flutter. Admitted with renal failure, necessitating dialysis. Course has been, complicated by atrial fibrillation with RVR, and volume overload with pulmonary edema and volume overload requiring intubation.  PULMONARY A:Acute hypoxic respiratory failure/respiratory distress respiratory parameters are improved since doing dialysis- pt failed weaning trial yesterday due to tachypnea, tachycardia, inadequate tidal volumes.  -Intubated 02/25/16 -Review of chest x-ray images. 2/8 shows improved pulmonary edema -Vent settings PRvC/18/450/5/30%  P:   -Continue vent support today, will attempt weaning trial again today after HD with fluid removal.    CARDIOVASCULAR A: Acute on chronicA. fib with RVR with bradycardia- sick sinus syndrome P:  -Cardizem 240 mg by mouth daily, metoprolol PO scheduled, and IV PRN, amiodarone by mouth. -Continue anticoagulation for atrial fibrillation.  RENAL A:  End-stage renal disease, history of right nephrectomy. P:   -Dialysis per nephro  GASTROINTESTINAL A:  -Continue tube feeds.  HEMATOLOGIC A: Anemia-chronic. Hemoglobin 8 today, INR 4.11  -Hold Coumadin.  INFECTIOUS A:  -- P:    Micro/culture results:  BCx2 -- UC -- Sputum--  Antibiotics: --  ENDOCRINE A: Sliding-scale insulin, blood sugar appears adequately controlled.  NEUROLOGIC A:  -Sedation.   MAJOR EVENTS/TEST RESULTS:   Best Practices  DVT Prophylaxis: Coumadin GI Prophylaxis: famotidine.    ---------------------------------------   ----------------------------------------   Name: Joan Erickson MRN: [id number] DOB: 1931-10-22    ADMISSION DATE:  02/05/2016    SUBJECTIVE:   Pt currently on the ventilator, can not provide history or review of systems.     VITAL SIGNS: Temp:  [97.6 F (36.4  C)-99.8 F (37.7 C)] 98.8 F (37.1 C) (02/08 0400) Pulse Rate:  [69-140] 71 (02/08 0600) Resp:  [9-29] 16 (02/08 0600) BP: (75-171)/(45-81) 110/56 (02/08 0600) SpO2:  [98 %-100 %] 100 % (02/08 0600) FiO2 (%):  [30 %] 30 % (02/08 0350) Weight:  [63.1 kg (139 lb 1.8 oz)] 63.1 kg (139 lb 1.8 oz) (02/08 0451) HEMODYNAMICS:   VENTILATOR SETTINGS: Vent Mode: PRVC FiO2 (%):  [30 %] 30 % Set Rate:  [18 bmp] 18 bmp Vt Set:  [450 mL] 450 mL PEEP:  [5 cmH20] 5 cmH20 Pressure Support:  [5 cmH20] 5 cmH20 INTAKE / OUTPUT:  Intake/Output Summary (Last 24 hours) at 02/27/16 [id number] Last data filed at 02/27/16 0500  Gross per 24 hour  Intake          1549.46 ml  Output             2910 ml  Net         -1360.54 ml    PHYSICAL EXAMINATION: Physical Examination:   VS: BP (!) 110/56   Pulse 71   Temp 98.8 F (37.1 C) (Oral)   Resp 16   Ht 5' (1.524 m)   Wt 63.1 kg (139 lb 1.8 oz)   SpO2 100%   BMI 27.17 kg/m   General Appearance: No distress , on vent.  Neuro:without focal findings, mental status reduced, unresponsive.  HEENT: PERRLA, EOM intact. Pulmonary: Bilateral crepitations.  CardiovascularNormal S1,S2.  No m/r/g.   Abdomen: Benign, Soft, non-tender. Renal:  No costovertebral tenderness  GU:  Not performed at this time. Endocrine: No evident thyromegaly. Skin:   warm, no rashes, no ecchymosis  Extremities: normal, no cyanosis, clubbing.   LABS:   LABORATORY PANEL:   CBC  Recent Labs Lab  02/27/16 0536  WBC 10.5  HGB 8.0*  HCT 24.1*  PLT 209    Chemistries   Recent Labs Lab 02/27/16 0536  NA 134*  K 3.4*  CL 100*  CO2 28  GLUCOSE 171*  BUN 43*  CREATININE 3.73*  CALCIUM 7.4*  MG 1.9  PHOS 1.9*     Recent Labs Lab 02/26/16 1221 02/26/16 1658 02/26/16 1956 02/26/16 2336 02/27/16 0400 02/27/16 0658  GLUCAP 116* 226* 169* 183* 155* 168*    Recent Labs Lab 02/25/16 0630 02/25/16 1008 02/26/16 0430  PHART 7.01* 7.41 7.53*  PCO2ART >120*  43 38  PO2ART 80* 207* 89   No results for input(s): AST, ALT, ALKPHOS, BILITOT, ALBUMIN in the last 168 hours.  Cardiac Enzymes No results for input(s): TROPONINI in the last 168 hours.  RADIOLOGY:  Dg Chest 1 View  Result Date: 02/27/2016 CLINICAL DATA:  Shortness of Breath EXAM: CHEST 1 VIEW COMPARISON:  02/26/2016 FINDINGS: Cardiac shadow remains enlarged. An endotracheal tube, nasogastric catheter and jugular dialysis catheter are again seen and stable. Left subclavian central line is again noted and stable. Bilateral pleural effusions are seen. No focal confluent infiltrate is noted. IMPRESSION: Stable small bilateral pleural effusions. Tubes and lines as described. Electronically Signed   By: Inez Catalina M.D.   On: 02/27/2016 07:07   Dg Chest 1 View  Result Date: 02/26/2016 CLINICAL DATA:  Acute respiratory failure. EXAM: CHEST 1 VIEW COMPARISON:  02/25/2016 FINDINGS: Endotracheal tube, central venous catheters and NG tube all appear in good position. Chronic cardiomegaly. Small bilateral pleural effusions. Pulmonary vascularity is normal. IMPRESSION: No significant change.  Small bilateral pleural effusions. Electronically Signed   By: Lorriane Shire M.D.   On: 02/26/2016 07:20   Dg Abd 1 View  Result Date: 02/25/2016 CLINICAL DATA:  Status post orogastric tube placement EXAM: ABDOMEN - 1 VIEW COMPARISON:  Portable chest x-ray of today's date FINDINGS: The orogastric tube tip and proximal port lie in the region of the gastric cardia. There is patchy interstitial density at the left lung base. The bowel gas pattern is unremarkable. There surgical clips in the left upper quadrant of the abdomen. IMPRESSION: The orogastric tube tip in proximal port lie in the region of the gastric cardia. Advancement by 5-10 cm would be useful. Electronically Signed   By: David  Martinique M.D.   On: 02/25/2016 09:16   Dg Chest Port 1 View  Result Date: 02/25/2016 CLINICAL DATA:  Chronic renal insufficiency  stage IV with superimposed acute renal failure requiring dialysis. , respiratory failure, history of asthma, diabetes EXAM: PORTABLE CHEST 1 VIEW COMPARISON:  Portable chest x-ray of February 25, 2016 at 6:29 a.m. FINDINGS: The endotracheal tube tip has been and VATS and now all lies approximately 2.5 cm above the carina. The lungs are well-expanded. The interstitial markings are mildly prominent. The cardiac silhouette is enlarged. There is are small bilateral pleural effusions. The pulmonary vascularity is mildly engorged centrally. The esophagogastric tube proximal port is visible in projects in the region of the gastric cardia. The dual-lumen dialysis catheter tip projects at the cavoatrial junction. The left subclavian venous catheter tip projects over the midportion of the SVC. External pacemaker defibrillator pads are present. There is aortic arch calcification. IMPRESSION: Interval advancement of the endotracheal tube such that the tip now lies 2.5 cm above the carina. Mild pulmonary interstitial edema and small bilateral pleural effusions. Thoracic aortic atherosclerosis. Electronically Signed   By: David  Martinique M.D.   On: 02/25/2016  09:19       --Marda Stalker, MD.  ICU Pager: [phone] [facility] Office Number: [phone]  Patricia Pesa, M.D.  Vilinda Boehringer, M.D.  Merton Border, M.D  02/27/2016    Critical Care Attestation.  I have personally obtained a history, examined the patient, evaluated laboratory and imaging results, formulated the assessment and plan and placed orders. The Patient requires high complexity decision making for assessment and support, frequent evaluation and titration of therapies, application of advanced monitoring technologies and extensive interpretation of multiple databases. The patient has critical illness that could lead imminently to failure of 1 or more organ systems and requires the highest level of physician preparedness to  intervene.  Critical Care Time devoted to patient care services described in this note is 35 minutes and is exclusive of time spent in procedures.

## 2016-02-27 NOTE — Progress Notes (Signed)
[facility]  ROUNDING NOTE   Subjective:   Hemodialysis last two days.  Uf of 4.5 litres total  Hemodialysis for today.   Husband at bedside.   Continues mechanical ventilation.   Objective:  Vital signs in last 24 hours:  Temp:  [97.6 F (36.4 C)-99.8 F (37.7 C)] 98.8 F (37.1 C) (02/08 0400) Pulse Rate:  [69-140] 71 (02/08 0600) Resp:  [9-29] 16 (02/08 0600) BP: (75-127)/(45-69) 110/56 (02/08 0600) SpO2:  [98 %-100 %] 99 % (02/08 0849) FiO2 (%):  [30 %] 30 % (02/08 0849) Weight:  [63.1 kg (139 lb 1.8 oz)] 63.1 kg (139 lb 1.8 oz) (02/08 0451)  Weight change: 1.2 kg (2 lb 10.3 oz) Filed Weights   02/19/16 1416 02/26/16 0108 02/27/16 0451  Weight: 63.1 kg (139 lb 3.2 oz) 61.9 kg (136 lb 7.4 oz) 63.1 kg (139 lb 1.8 oz)    Intake/Output: I/O last 3 completed shifts: In: 2073.7 [I.V.:201.5; Blood:268; NG/GT:1604.3] Out: 2910 [Urine:910; Other:2000]   Intake/Output this shift:  No intake/output data recorded.  Physical Exam: General: Critically ill  Head: ETT  Eyes: Anicteric, PERRL  Neck: Supple, trachea midline  Lungs:  PRVC 30% , bilateral crackles  Heart: Regular rate and rhythm  Abdomen:  Soft, nontender,   Extremities: 1+ peripheral edema.  Neurologic: Nonfocal, moving all four extremities  Skin: No lesions  Access: RIJ permcath    Basic Metabolic Panel:  Recent Labs Lab 02/21/16 0548  02/23/16 0602 02/25/16 0908 02/25/16 1037 02/25/16 1743 02/26/16 0455 02/26/16 1821 02/27/16 0536  NA 134*  --  132* 131*  --   --  134*  --  134*  K 4.3  --  3.9 4.5  --   --  3.7 3.5 3.4*  CL 99*  --  95* 92*  --   --  98*  --  100*  CO2 29  --  31 27  --   --  29  --  28  GLUCOSE 127*  --  146* 161*  --   --  140*  --  171*  BUN 21*  --  22* 55*  --   --  53*  --  43*  CREATININE 3.00*  --  3.34* 5.75*  --   --  4.80*  --  3.73*  CALCIUM 7.8*  --  7.8* 8.3*  --   --  7.8*  --  7.4*  MG  --   --   --   --  2.3 2.0 2.2 1.9 1.9  PHOS  --   < >   --   --  5.4* 3.5 2.2* 1.3* 1.9*  < > = values in this interval not displayed.  Liver Function Tests: No results for input(s): AST, ALT, ALKPHOS, BILITOT, PROT, ALBUMIN in the last 168 hours. No results for input(s): LIPASE, AMYLASE in the last 168 hours. No results for input(s): AMMONIA in the last 168 hours.  CBC:  Recent Labs Lab 02/22/16 0617 02/23/16 0602 02/25/16 0908 02/26/16 0455 02/27/16 0536  WBC 7.7 9.2 5.3 9.0 10.5  HGB 7.8* 8.1* 7.2* 6.5* 8.0*  HCT 23.0* 24.2* 21.8* 19.8* 24.1*  MCV 84.0 83.0 83.5 82.9 86.3  PLT 241 254 221 231 209    Cardiac Enzymes: No results for input(s): CKTOTAL, CKMB, CKMBINDEX, TROPONINI in the last 168 hours.  BNP: Invalid input(s): POCBNP  CBG:  Recent Labs Lab 02/26/16 1658 02/26/16 1956 02/26/16 2336 02/27/16 0400 02/27/16 0658  GLUCAP 226* 169* 183* 155*  168*    Microbiology: Results for orders placed or performed during the hospital encounter of 01/24/2016  Blood culture (routine x 2)     Status: Abnormal   Collection Time: 01/21/2016 12:31 PM  Result Value Ref Range Status   Specimen Description BLOOD RIGHT AC  Final   Special Requests   Final    BOTTLES DRAWN AEROBIC AND ANAEROBIC AER 11ML ANA 8ML   Culture  Setup Time   Final    GRAM POSITIVE COCCI AEROBIC BOTTLE ONLY CRITICAL RESULT CALLED TO, READ BACK BY AND VERIFIED WITH:  [place] AT 9702 01/27/2016 SDR    Culture (A)  Final    FACKLAMIA HOMINIS Standardized susceptibility testing for this organism is not available. Performed at [facility], [place] [address], [place], [place] [address]    Report Status 02/17/2016 FINAL  Final  Blood Culture ID Panel (Reflexed)     Status: None   Collection Time: 01/25/2016 12:31 PM  Result Value Ref Range Status   Enterococcus species NOT DETECTED NOT DETECTED Final   Listeria monocytogenes NOT DETECTED NOT DETECTED Final   Staphylococcus species NOT DETECTED NOT DETECTED Final   Staphylococcus aureus NOT DETECTED  NOT DETECTED Final   Streptococcus species NOT DETECTED NOT DETECTED Final   Streptococcus agalactiae NOT DETECTED NOT DETECTED Final   Streptococcus pneumoniae NOT DETECTED NOT DETECTED Final   Streptococcus pyogenes NOT DETECTED NOT DETECTED Final   Acinetobacter baumannii NOT DETECTED NOT DETECTED Final   Enterobacteriaceae species NOT DETECTED NOT DETECTED Final   Enterobacter cloacae complex NOT DETECTED NOT DETECTED Final   Escherichia coli NOT DETECTED NOT DETECTED Final   Klebsiella oxytoca NOT DETECTED NOT DETECTED Final   Klebsiella pneumoniae NOT DETECTED NOT DETECTED Final   Proteus species NOT DETECTED NOT DETECTED Final   Serratia marcescens NOT DETECTED NOT DETECTED Final   Haemophilus influenzae NOT DETECTED NOT DETECTED Final   Neisseria meningitidis NOT DETECTED NOT DETECTED Final   Pseudomonas aeruginosa NOT DETECTED NOT DETECTED Final   Candida albicans NOT DETECTED NOT DETECTED Final   Candida glabrata NOT DETECTED NOT DETECTED Final   Candida krusei NOT DETECTED NOT DETECTED Final   Candida parapsilosis NOT DETECTED NOT DETECTED Final   Candida tropicalis NOT DETECTED NOT DETECTED Final  Blood culture (routine x 2)     Status: None   Collection Time: 02/05/2016 12:32 PM  Result Value Ref Range Status   Specimen Description BLOOD LEFT HAND  Final   Special Requests   Final    BOTTLES DRAWN AEROBIC AND ANAEROBIC AER 13ML ANA 13ML   Culture NO GROWTH 5 DAYS  Final   Report Status 02/16/2016 FINAL  Final  MRSA PCR Screening     Status: None   Collection Time: 01/22/2016 10:32 PM  Result Value Ref Range Status   MRSA by PCR NEGATIVE NEGATIVE Final    Comment:        The GeneXpert MRSA Assay (FDA approved for NASAL specimens only), is one component of a comprehensive MRSA colonization surveillance program. It is not intended to diagnose MRSA infection nor to guide or monitor treatment for MRSA infections.   Culture, expectorated sputum-assessment     Status:  None   Collection Time: 01/20/2016  3:24 PM  Result Value Ref Range Status   Specimen Description SPUTUM  Final   Special Requests Normal  Final   Sputum evaluation   Final    Sputum specimen not acceptable for testing.  Please recollect.  RESULT CALLED TO, READ BACK BY AND VERIFIED WITH: Joan Erickson [facility] 02/08/2016 1608 KLW    Report Status 01/25/2016 FINAL  Final  Culture, blood (single) w Reflex to ID Panel     Status: None   Collection Time: 02/17/16  2:45 PM  Result Value Ref Range Status   Specimen Description BLOOD RIGHT ANTECUBITAL  Final   Special Requests   Final    BOTTLES DRAWN AEROBIC AND ANAEROBIC ANA 8CC AER [place]   Culture NO GROWTH 5 DAYS  Final   Report Status 02/22/2016 FINAL  Final  Culture, blood (Routine X 2) w Reflex to ID Panel     Status: None   Collection Time: 02/18/16  6:12 AM  Result Value Ref Range Status   Specimen Description BLOOD R HAND  Final   Special Requests BOTTLES DRAWN AEROBIC AND ANAEROBIC 1ML  Final   Culture NO GROWTH 5 DAYS  Final   Report Status 02/23/2016 FINAL  Final  Culture, blood (Routine X 2) w Reflex to ID Panel     Status: None   Collection Time: 02/18/16  6:12 AM  Result Value Ref Range Status   Specimen Description BLOOD LEFT HAND  Final   Special Requests BOTTLES DRAWN AEROBIC AND ANAEROBIC 4ML  Final   Culture NO GROWTH 5 DAYS  Final   Report Status 02/23/2016 FINAL  Final    Coagulation Studies:  Recent Labs  02/25/16 0453 02/26/16 0455 02/27/16 0536  LABPROT 25.5* 40.7* 40.9*  INR 2.28 4.09* 4.11*    Urinalysis: No results for input(s): COLORURINE, LABSPEC, PHURINE, GLUCOSEU, HGBUR, BILIRUBINUR, KETONESUR, PROTEINUR, UROBILINOGEN, NITRITE, LEUKOCYTESUR in the last 72 hours.  Invalid input(s): APPERANCEUR    Imaging: Dg Chest 1 View  Result Date: 02/27/2016 CLINICAL DATA:  Shortness of Breath EXAM: CHEST 1 VIEW COMPARISON:  02/26/2016 FINDINGS: Cardiac shadow remains enlarged. An endotracheal tube, nasogastric  catheter and jugular dialysis catheter are again seen and stable. Left subclavian central line is again noted and stable. Bilateral pleural effusions are seen. No focal confluent infiltrate is noted. IMPRESSION: Stable small bilateral pleural effusions. Tubes and lines as described. Electronically Signed   By: Inez Catalina M.D.   On: 02/27/2016 07:07   Dg Chest 1 View  Result Date: 02/26/2016 CLINICAL DATA:  Acute respiratory failure. EXAM: CHEST 1 VIEW COMPARISON:  02/25/2016 FINDINGS: Endotracheal tube, central venous catheters and NG tube all appear in good position. Chronic cardiomegaly. Small bilateral pleural effusions. Pulmonary vascularity is normal. IMPRESSION: No significant change.  Small bilateral pleural effusions. Electronically Signed   By: Lorriane Shire M.D.   On: 02/26/2016 07:20     Medications:   . sodium chloride    . feeding supplement (VITAL HIGH PROTEIN) 1,000 mL (02/27/16 0600)  . norepinephrine (LEVOPHED) Adult infusion Stopped (02/25/16 1405)  . propofol (DIPRIVAN) infusion 10.037 mcg/kg/min (02/27/16 0600)   . amiodarone  200 mg Oral Daily  . arformoterol  15 mcg Nebulization BID  . budesonide (PULMICORT) nebulizer solution  0.25 mg Nebulization BID  . chlorhexidine gluconate (MEDLINE KIT)  15 mL Mouth Rinse BID  . citalopram  10 mg Per Tube Daily  . diltiazem  120 mg Oral Q12H  . epoetin (EPOGEN/PROCRIT) injection  10,000 Units Intravenous Q T,Th,Sa-HD  . famotidine  20 mg Oral Daily  . feeding supplement (PRO-STAT SUGAR FREE 64)  30 mL Per Tube Daily  . furosemide  10 mg Intravenous Once  . insulin aspart  0-5 Units Subcutaneous QHS  . insulin aspart  0-9 Units Subcutaneous TID WC  . mouth rinse  15 mL Mouth Rinse 10 times per day  . metoprolol tartrate  25 mg Oral Q12H  . potassium phosphate IVPB (mmol)  15 mmol Intravenous Once  . pravastatin  20 mg Oral q1800  . senna-docusate  1 tablet Oral BID  . sodium chloride flush  3 mL Intravenous Q12H  .  Warfarin - Pharmacist Dosing Inpatient   Does not apply q1800   sodium chloride, sodium chloride, albuterol, bisacodyl, fentaNYL (SUBLIMAZE) injection, guaiFENesin-dextromethorphan, metoprolol, midazolam, nitroGLYCERIN, [DISCONTINUED] ondansetron **OR** ondansetron (ZOFRAN) IV, senna-docusate  Assessment/ Plan:  Joan Erickson is a 83 y.o. 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 ESRD: with flash pulmonary edema. - Dialysis later today. Evaluate daily for dialysis neeed.   2. Hypertension: off vasopressors. Monitor blood pressure.   3. Anemia of chronic kidney disease: status post PRBC transfusion 2/7  - epo with TTS treatments.   4. Secondary Hyperparathyroidism: phos and calcium at goal. Not currently on binders.   5. Hyponatremia: secondary to chronic kidney disease.    LOS: [place], [place] 2/8/201810:26 AM

## 2016-02-27 NOTE — Progress Notes (Signed)
Post hd vitals 

## 2016-02-27 NOTE — Progress Notes (Signed)
Pre hd assessment  

## 2016-02-28 ENCOUNTER — Inpatient Hospital Stay: Payer: Medicare Other

## 2016-02-28 DIAGNOSIS — J9383 Other pneumothorax: Secondary | ICD-10-CM

## 2016-02-28 LAB — CBC
HEMATOCRIT: 29.3 % — AB (ref 35.0–47.0)
HEMOGLOBIN: 9.4 g/dL — AB (ref 12.0–16.0)
MCH: 28 pg (ref 26.0–34.0)
MCHC: 32 g/dL (ref 32.0–36.0)
MCV: 87.4 fL (ref 80.0–100.0)
Platelets: 271 10*3/uL (ref 150–440)
RBC: 3.36 MIL/uL — ABNORMAL LOW (ref 3.80–5.20)
RDW: 17 % — ABNORMAL HIGH (ref 11.5–14.5)
WBC: 16.6 10*3/uL — ABNORMAL HIGH (ref 3.6–11.0)

## 2016-02-28 LAB — BASIC METABOLIC PANEL
Anion gap: 6 (ref 5–15)
BUN: 33 mg/dL — AB (ref 6–20)
CHLORIDE: 98 mmol/L — AB (ref 101–111)
CO2: 33 mmol/L — AB (ref 22–32)
CREATININE: 3.03 mg/dL — AB (ref 0.44–1.00)
Calcium: 7.8 mg/dL — ABNORMAL LOW (ref 8.9–10.3)
GFR calc non Af Amer: 13 mL/min — ABNORMAL LOW (ref >60)
GFR, EST AFRICAN AMERICAN: 15 mL/min — AB (ref >60)
GLUCOSE: 137 mg/dL — AB (ref 65–99)
Potassium: 3.9 mmol/L (ref 3.5–5.1)
Sodium: 137 mmol/L (ref 135–145)

## 2016-02-28 LAB — PROCALCITONIN: Procalcitonin: 2.48 ng/mL

## 2016-02-28 LAB — GLUCOSE, CAPILLARY
GLUCOSE-CAPILLARY: 125 mg/dL — AB (ref 65–99)
GLUCOSE-CAPILLARY: 123 mg/dL — AB (ref 65–99)
Glucose-Capillary: 142 mg/dL — ABNORMAL HIGH (ref 65–99)
Glucose-Capillary: 155 mg/dL — ABNORMAL HIGH (ref 65–99)
Glucose-Capillary: 126 mg/dL — ABNORMAL HIGH (ref 65–99)

## 2016-02-28 LAB — TRIGLYCERIDES: TRIGLYCERIDES: 64 mg/dL (ref <150)

## 2016-02-28 LAB — PROTIME-INR
INR: 2.45
Prothrombin Time: 27 seconds — ABNORMAL HIGH (ref 11.4–15.2)

## 2016-02-28 LAB — PHOSPHORUS: Phosphorus: 3.1 mg/dL (ref 2.5–4.6)

## 2016-02-28 MED ORDER — FAMOTIDINE 20 MG PO TABS
20.0000 mg | ORAL_TABLET | Freq: Every day | ORAL | Status: DC
  Administered 2016-02-28 – 2016-03-02 (×4): 20 mg
  Filled 2016-02-28 (×3): qty 1

## 2016-02-28 MED ORDER — LIP MEDEX EX OINT: TOPICAL_OINTMENT | CUTANEOUS | Status: DC | PRN

## 2016-02-28 MED ORDER — METOPROLOL TARTRATE 25 MG PO TABS
25.0000 mg | ORAL_TABLET | Freq: Two times a day (BID) | ORAL | Status: DC
  Administered 2016-02-28 – 2016-03-01 (×4): 25 mg
  Filled 2016-02-28 (×4): qty 1

## 2016-02-28 MED ORDER — SENNOSIDES-DOCUSATE SODIUM 8.6-50 MG PO TABS
1.0000 | ORAL_TABLET | Freq: Two times a day (BID) | ORAL | Status: DC
  Administered 2016-02-28: 1

## 2016-02-28 MED ORDER — SENNOSIDES-DOCUSATE SODIUM 8.6-50 MG PO TABS
2.0000 | ORAL_TABLET | Freq: Two times a day (BID) | ORAL | Status: DC
  Administered 2016-02-28 – 2016-03-02 (×6): 2
  Filled 2016-02-28 (×6): qty 2

## 2016-02-28 MED ORDER — DILTIAZEM 12 MG/ML ORAL SUSPENSION
120.0000 mg | Freq: Two times a day (BID) | ORAL | Status: DC
  Administered 2016-02-28 – 2016-03-01 (×4): 120 mg via ORAL
  Filled 2016-02-28 (×9): qty 12

## 2016-02-28 MED ORDER — PIPERACILLIN-TAZOBACTAM 3.375 G IVPB: 3.3750 g | Freq: Two times a day (BID) | INTRAVENOUS | Status: DC

## 2016-02-28 MED ORDER — PRAVASTATIN SODIUM 40 MG PO TABS
20.0000 mg | ORAL_TABLET | Freq: Every day | ORAL | Status: DC
  Administered 2016-02-28 – 2016-03-01 (×3): 20 mg
  Filled 2016-02-28 (×3): qty 1

## 2016-02-28 MED ORDER — INSULIN ASPART 100 UNIT/ML ~~LOC~~ SOLN
2.0000 [IU] | SUBCUTANEOUS | Status: DC
  Administered 2016-02-28: 6 [IU] via SUBCUTANEOUS
  Administered 2016-02-28 (×2): 2 [IU] via SUBCUTANEOUS
  Administered 2016-02-28: 4 [IU] via SUBCUTANEOUS
  Administered 2016-02-28 – 2016-02-29 (×4): 2 [IU] via SUBCUTANEOUS
  Administered 2016-02-29: 4 [IU] via SUBCUTANEOUS
  Filled 2016-02-28 (×5): qty 2
  Filled 2016-02-28: qty 6
  Filled 2016-02-28: qty 4
  Filled 2016-02-28: qty 2
  Filled 2016-02-28: qty 4

## 2016-02-28 MED ORDER — INSULIN GLARGINE 100 UNIT/ML ~~LOC~~ SOLN
10.0000 [IU] | Freq: Every day | SUBCUTANEOUS | Status: DC
  Administered 2016-02-28 – 2016-03-01 (×3): 10 [IU] via SUBCUTANEOUS
  Filled 2016-02-28 (×4): qty 0.1

## 2016-02-28 MED ORDER — AMIODARONE HCL 200 MG PO TABS
200.0000 mg | ORAL_TABLET | Freq: Every day | ORAL | Status: DC
  Administered 2016-02-28 – 2016-03-01 (×3): 200 mg
  Filled 2016-02-28 (×2): qty 1

## 2016-02-28 MED ORDER — BLISTEX MEDICATED EX OINT
TOPICAL_OINTMENT | CUTANEOUS | Status: DC | PRN
  Filled 2016-02-28: qty 6.3

## 2016-02-28 MED ORDER — PIPERACILLIN-TAZOBACTAM 3.375 G IVPB
3.3750 g | Freq: Two times a day (BID) | INTRAVENOUS | Status: DC
  Administered 2016-02-28 – 2016-03-02 (×6): 3.375 g via INTRAVENOUS
  Filled 2016-02-28 (×6): qty 50

## 2016-02-28 MED ORDER — WARFARIN SODIUM 2.5 MG PO TABS
2.5000 mg | ORAL_TABLET | Freq: Every day | ORAL | Status: DC
  Administered 2016-02-28: 2.5 mg via ORAL
  Filled 2016-02-28: qty 1

## 2016-02-28 NOTE — Progress Notes (Signed)
[facility] NAME: Joan Erickson    MR#:  [id number]  DATE OF BIRTH:  January 29, 1931  SUBJECTIVE:  Patient is Very awake and alert. remains on ventilator. receiving second HD again, to take more fluids out. Family at bedside REVIEW OF SYSTEMS:   Review of Systems  Unable to perform ROS: Intubated   Tolerating Diet:  DRUG ALLERGIES:  No Known Allergies  VITALS:  Blood pressure 120/65, pulse (!) 105, temperature 97.8 F (36.6 C), temperature source Axillary, resp. rate 17, height 5' (1.524 m), weight 60 kg (132 lb 4.4 oz), SpO2 99 %.  PHYSICAL EXAMINATION:   Physical Exam  GENERAL:  83 y.o.-year-old patient lying in the bed with no acute distress.  EYES: Pupils equal, round, reactive to light and accommodation. No scleral icterus.  HEENT: Head atraumatic, normocephalic. Oropharynx and nasopharynx clear. ETT in place NECK:  Supple, no jugular venous distention. No thyroid enlargement, no tenderness.  LUNGS: Normal breath sounds bilaterally, no wheezing, Some crepitations. No use of accessory muscles of respiration. ETT in place, on vent support. CARDIOVASCULAR: Irregularly irregular No murmurs, rubs, or gallops.  ABDOMEN: Soft, nontender, nondistended. Bowel sounds present. No organomegaly or mass.  EXTREMITIES: No cyanosis, clubbing or edema b/l.    NEUROLOGIC: Patient is currently on ventilator support.  Off sedation today, able to communicate with me by writing down on a paper, completely alert and oriented. Moves all 4 limbs. PSYCHIATRIC:  patient is  Alert and oriented.  SKIN: No obvious rash, lesion, or ulcer.  LABORATORY PANEL:  CBC  Recent Labs Lab 02/28/16 0440  WBC 16.6*  HGB 9.4*  HCT 29.3*  PLT 271    Chemistries   Recent Labs Lab 02/27/16 0536  02/28/16 0440  NA 134*  < > 137  K 3.4*  < > 3.9  CL 100*  < > 98*  CO2 28  < > 33*  GLUCOSE 171*  < > 137*  BUN 43*  < > 33*  CREATININE 3.73*  < >  3.03*  CALCIUM 7.4*  < > 7.8*  MG 1.9  --   --   < > = values in this interval not displayed. Cardiac Enzymes No results for input(s): TROPONINI in the last 168 hours. RADIOLOGY:  Dg Chest 1 View  Result Date: 02/28/2016 CLINICAL DATA:  Post chest tube EXAM: CHEST 1 VIEW COMPARISON:  02/28/2016 FINDINGS: Interval placement of small bore chest tube on the left. Decreasing left apical pneumothorax, now small, less than 5%. Cardiomegaly with vascular congestion. Bilateral lower lobe opacities and layering effusions, increased since prior study. Remainder of the support devices are stable. IMPRESSION: Interval placement of left chest tube with near complete resolution of the left pneumothorax. Increasing bibasilar opacities and layering effusions. Electronically Signed   By: Rolm Baptise M.D.   On: 02/28/2016 12:02   Dg Chest 1 View  Result Date: 02/28/2016 CLINICAL DATA:  Anticipating ETT removal. EXAM: CHEST 1 VIEW COMPARISON:  February 27, 2016 FINDINGS: The ETT is in good position. Other support apparatus is stable. A left apical pneumothorax measures 3 cm today versus 1.8 cm yesterday, a little larger in the interval. No evidence of tension. No right-sided pneumothorax. Small effusions remain, particularly on the left with underlying atelectasis. The cardiomediastinal silhouette is stable. No overt edema. IMPRESSION: 1. The left apical pneumothorax is a little larger in the interval measuring 3 cm at the apex today versus 1.8 cm yesterday. 2. Support apparatus  is stable. 3. Continued cardiomegaly. 4. Small effusions, particularly on the left with underlying atelectasis. These results will be called to the ordering clinician or representative by the Radiologist Assistant, and communication documented in the PACS or zVision Dashboard. Electronically Signed   By: Dorise Bullion III M.D   On: 02/28/2016 08:29   Dg Chest 1 View  Result Date: 02/27/2016 CLINICAL DATA:  Shortness of Breath EXAM: CHEST 1 VIEW  COMPARISON:  02/26/2016 FINDINGS: Cardiac shadow remains enlarged. An endotracheal tube, nasogastric catheter and jugular dialysis catheter are again seen and stable. Left subclavian central line is again noted and stable. Bilateral pleural effusions are seen. No focal confluent infiltrate is noted. IMPRESSION: Stable small bilateral pleural effusions. Tubes and lines as described. Electronically Signed   By: Inez Catalina M.D.   On: 02/27/2016 07:07   ASSESSMENT AND PLAN:  Joan Erickson  is a 83 y.o. female with a known history of A. fib, hypertension, hyperlipidemia, CKD stage 4 and asthma. The patient was treated for pneumonia with antibiotics a month ago by her primary care doctor. She has been feeling increasingly weak and shortness of breath on exertion for the past few days. Started to have a palpitation today and feels cold. Her PCP told her that her heart rate was fast and sent patient to ED.   *Acute hypoxic respiratory failure with hypercapnia secondary to sedation (on 03/09/2016) for procedure and poor inspiratory effort   Again on 02/25/16 - due to flash pulmonary edema and fluid overload. Initially Clinically improved status post hemodialysis   required 6 ltr oxygen via nasal canula on walking on 02/24/16, so held discharge.   On 02/25/16 early morning- intubated, nephrology did hemodialysis to remove some fluid , again HD today.   Plan is to wean off vent after HD, if she can tolerate. Currently off sedation. - She is unable to get off vent since 6th. And now requiring chest tube - d/w CCM APP Hinton Dyer) and transferred service to them.  * Left Pneumothorax - requiring chest tube placement by CCM earlier today  * Acute on chrnicA. fib with RVR with bradycardia- sick sinus syndrome Cardizem and amiodarone drip discontinued -Resume Coumadin- dosing per pharmacy. - ultrasound with no evidence of DVT and upper extremity  -Pauses were noticed while patient was on amiodarone drip, IV  discontinued Continue Cardizem CD 240 mg by mouth  Oral amio and metoprolol as per cardio.   INR is 2.45 - consider restarting coumadin today if CCM is ok.  *SVT-resolved -Status post rapid response and adenosine  * ac on ch anemia- due to CKD   Need Blood transfusion as Hb 6.5 (02/26/16) s/p 1 PRBC and now Hb 8.0   Give Lasix one time dose with transfusion.  * Pneumonia - Ruled out - normal procalcitonin - stopped Abx early on - ID c/s as blood c/s growing GPR - likely false +  repeat cx negative. - someone has checked procalcitonin and is high, consider restarting Abx if concern for bacterial infection  *Elevated troponin, due to  A. fib and renal failure, neg serial troponins and cardiology following.   * DVT prophylaxis   On coumadin now.  *Acute renal failure /Hyponatremia: Na improved from 125-131--134  nephro following.    Now ESRD, permacath placed, started on HD.   Patient had permacath placement 02/25/2016  *Diabetes: sliding scale. Hold Tradjenta  *Insomnia Benadryl as needed   home health PT  Case discussed with Care Management/Social Worker. Management plans discussed  with the patient, CCM, Nursing and they are in agreement.  Will sign off at this time. Transfer service to CCM (d/w Hinton Dyer who is in agreement). Please call me directly during rounding hrs if patient needs service transferred back.   CODE STATUS: full  DVT Prophylaxis: heparin  TOTAL CRITICAL CARE TIME TAKING CARE OF THIS PATIENT: 53minutes.   >50% time spent on counselling and coordination of care   POSSIBLE D/C IN 2-3 DAYS, DEPENDING ON CLINICAL CONDITION.  Note: This dictation was prepared with Dragon dictation along with smaller phrase technology. Any transcriptional errors that result from this process are unintentional.  Max Sane M.D on 02/28/2016 at 12:49 PM  Between 7am to 6pm - Pager - [phone]  After 6pm go to [email or web address] - password [email or web address] [facility]  Office  [phone]  CC: Primary care physician; Einar Pheasant, MD

## 2016-02-28 NOTE — Progress Notes (Signed)
[facility] Progess Note    ASSESSMENT/PLAN   Summary:  5 F with history of CAF, asthma, CKD admitted 02/15/2016 with SOB and atrial flutter. Admitted with renal failure, necessitating dialysis. Course has been, complicated by atrial fibrillation with RVR, and volume overload with pulmonary edema and volume overload requiring intubation.  PULMONARY A:Acute hypoxic respiratory failure/respiratory distress respiratory parameters are improved since doing dialysis- pt failed weaning trial x2 attempts due to tachypnea, tachycardia, inadequate tidal volumes.  -Apical Pneumothorax noted on CXR 02/8 -Intubated 02/25/16 -Vent settings PRVC/18/450/5/30% P:   Full vent support will attempt SBT again 02/10 Maintain O2 sats >92% Repeat CXR post chest tube placement   Chest tube placement today 02/9 Bibasilar atelectasis with minimal/slow progress on weaning, will start empiric abx.   CARDIOVASCULAR A: Acute on chronicA. fib with RVR with bradycardia- sick sinus syndrome P:  -Cardizem 240 mg per tube, metoprolol PO scheduled and IV PRN, amiodarone per tube  -Continue coumadin for atrial fibrillation.  RENAL A:  End-stage renal disease, history of right nephrectomy--Xanthogranulomatous pyelonephritis. P:   -Trend BMP's -Replace electrolytes as indicated -Nephrology consulted appreciate input  -Per Nephrology pt to receive dialysis on scheduled days T-TH-Sat  GASTROINTESTINAL A: No acute issues Hx: GERD  P: Continue tube feedings  Pepcid for PUD prophylaxis   HEMATOLOGIC A: Anemia-chronic. Hemoglobin 9.4 today, INR 2.75 P: Trend CBC Monitor for s/sx of bleeding  Coumadin dosing per pharmacy SCD's for VTE prophylaxis   INFECTIOUS A: Leukocytosis Hypogammaglobulinemia  P:   Trend WBC and monitor fever curve Trend PCT's Follow cultures Start empiric abx due to slow progress on weaning.    Micro/culture results: BC x2 01/23>>facklamia hominis  BCx2  01/30 x2>>negative   Antibiotics: Zosyn 2/9>>  ENDOCRINE A: Hyperglycemia  P: CBG's q4hrs SSI   NEUROLOGIC A: No acute issues P: Maintain RASS goal 0 to -1 Propofol gtt and prn fentanyl to maintain RASS goal WUA daily   MAJOR EVENTS/TEST RESULTS:   Best Practices  DVT Prophylaxis: Coumadin GI Prophylaxis: famotidine.    ---------------------------------------   ----------------------------------------   Name: Joan Erickson MRN: [id number] DOB: March 03, 1931    ADMISSION DATE:  01/23/2016    SUBJECTIVE:  Attempted SBT today, however pt became tachypneic with use of accessory muscles, therefore placed pt back on full vent support.  VITAL SIGNS: Temp:  [97.8 F (36.6 C)-99.6 F (37.6 C)] 97.8 F (36.6 C) (02/09 0800) Pulse Rate:  [67-111] 96 (02/09 0600) Resp:  [0-26] 19 (02/09 0600) BP: (87-147)/(46-119) 87/54 (02/09 0600) SpO2:  [89 %-100 %] 100 % (02/09 0810) FiO2 (%):  [30 %] 30 % (02/09 0810) Weight:  [60 kg (132 lb 4.4 oz)] 60 kg (132 lb 4.4 oz) (02/09 0442) HEMODYNAMICS:   VENTILATOR SETTINGS: Vent Mode: PRVC FiO2 (%):  [30 %] 30 % Set Rate:  [10 bmp-18 bmp] 10 bmp Vt Set:  [450 mL] 450 mL PEEP:  [5 cmH20] 5 cmH20 Pressure Support:  [5 cmH20-15 cmH20] 5 cmH20 INTAKE / OUTPUT:  Intake/Output Summary (Last 24 hours) at 02/28/16 0922 Last data filed at 02/28/16 0800  Gross per 24 hour  Intake           1162.4 ml  Output             1710 ml  Net           -547.6 ml    PHYSICAL EXAMINATION: Physical Examination:   VS: BP (!) 87/54   Pulse 96   Temp  97.8 F (36.6 C) (Axillary)   Resp 19   Ht 5' (1.524 m)   Wt 60 kg (132 lb 4.4 oz)   SpO2 100%   BMI 25.83 kg/m   General Appearance: well developed, well nourished AA female  Neuro: alert following commands, PERRLA  HEENT: supple, mild JVD Pulmonary: faint crackles bilateral bases, tachypneic with use of accessory muscles  Cardiovascular: irregular, irregular, no M/R/G Abdomen: Benign,  Soft, non-tender, non-distended  Skin: warm, no rashes, no ecchymosis  Extremities: normal, no cyanosis, clubbing.   LABS:   LABORATORY PANEL:   CBC  Recent Labs Lab 02/28/16 0440  WBC 16.6*  HGB 9.4*  HCT 29.3*  PLT 271    Chemistries   Recent Labs Lab 02/27/16 0536  02/28/16 0440  NA 134*  < > 137  K 3.4*  < > 3.9  CL 100*  < > 98*  CO2 28  < > 33*  GLUCOSE 171*  < > 137*  BUN 43*  < > 33*  CREATININE 3.73*  < > 3.03*  CALCIUM 7.4*  < > 7.8*  MG 1.9  --   --   PHOS 1.9*  < > 3.1  < > = values in this interval not displayed.   Recent Labs Lab 02/27/16 1659 02/27/16 2000 02/27/16 2130 02/27/16 2358 02/28/16 0354 02/28/16 0734  GLUCAP 123* 142* 156* 213* 125* 123*    Recent Labs Lab 02/25/16 0630 02/25/16 1008 02/26/16 0430  PHART 7.01* 7.41 7.53*  PCO2ART >120* 43 38  PO2ART 80* 207* 89    Recent Labs Lab 02/27/16 0930  ALBUMIN 2.2*    Cardiac Enzymes No results for input(s): TROPONINI in the last 168 hours.  RADIOLOGY:  Dg Chest 1 View  Result Date: 02/28/2016 CLINICAL DATA:  Anticipating ETT removal. EXAM: CHEST 1 VIEW COMPARISON:  February 27, 2016 FINDINGS: The ETT is in good position. Other support apparatus is stable. A left apical pneumothorax measures 3 cm today versus 1.8 cm yesterday, a little larger in the interval. No evidence of tension. No right-sided pneumothorax. Small effusions remain, particularly on the left with underlying atelectasis. The cardiomediastinal silhouette is stable. No overt edema. IMPRESSION: 1. The left apical pneumothorax is a little larger in the interval measuring 3 cm at the apex today versus 1.8 cm yesterday. 2. Support apparatus is stable. 3. Continued cardiomegaly. 4. Small effusions, particularly on the left with underlying atelectasis. These results will be called to the ordering clinician or representative by the Radiologist Assistant, and communication documented in the PACS or zVision Dashboard.  Electronically Signed   By: Dorise Bullion III M.D   On: 02/28/2016 08:29   Dg Chest 1 View  Result Date: 02/27/2016 CLINICAL DATA:  Shortness of Breath EXAM: CHEST 1 VIEW COMPARISON:  02/26/2016 FINDINGS: Cardiac shadow remains enlarged. An endotracheal tube, nasogastric catheter and jugular dialysis catheter are again seen and stable. Left subclavian central line is again noted and stable. Bilateral pleural effusions are seen. No focal confluent infiltrate is noted. IMPRESSION: Stable small bilateral pleural effusions. Tubes and lines as described. Electronically Signed   By: Inez Catalina M.D.   On: 02/27/2016 07:07    Update-Pts daughter updated about plan of care and questions answered 02/28/16  Marda Stalker, [place] Pager [phone] (please enter 7 digits) [place] Pager [phone] (please enter 7 digits)

## 2016-02-28 NOTE — Progress Notes (Signed)
[facility]  ROUNDING NOTE   Subjective:   Daughter and husband at bedside.  Three continuous days of hemodialysis. UF of 1.6 litres yesterday.   Developed pneumothorax  Objective:  Vital signs in last 24 hours:  Temp:  [97.8 F (36.6 C)-99.6 F (37.6 C)] 97.8 F (36.6 C) (02/09 0800) Pulse Rate:  [67-114] 114 (02/09 1000) Resp:  [6-26] 17 (02/09 1000) BP: (87-147)/(46-119) 122/65 (02/09 1000) SpO2:  [89 %-100 %] 99 % (02/09 1000) FiO2 (%):  [30 %] 30 % (02/09 1035) Weight:  [60 kg (132 lb 4.4 oz)] 60 kg (132 lb 4.4 oz) (02/09 0442)  Weight change: -3.1 kg (-6 lb 13.3 oz) Filed Weights   02/26/16 0108 02/27/16 0451 02/28/16 0442  Weight: 61.9 kg (136 lb 7.4 oz) 63.1 kg (139 lb 1.8 oz) 60 kg (132 lb 4.4 oz)    Intake/Output: I/O last 3 completed shifts: In: 2422.7 [I.V.:282.7; NG/GT:1890; IV Piggyback:250] Out: 2229 [Urine:170; Other:1600]   Intake/Output this shift:  Total I/O In: 67.6 [I.V.:7.6; NG/GT:60] Out: -   Physical Exam: General: Critically ill  Head: ETT  Eyes: Anicteric, PERRL  Neck: Supple, trachea midline  Lungs:  PRVC 30% , clear  Heart: Regular rate and rhythm  Abdomen:  Soft, nontender,   Extremities: 1+ peripheral edema.  Neurologic: Nonfocal, moving all four extremities  Skin: No lesions  Access: RIJ permcath    Basic Metabolic Panel:  Recent Labs Lab 02/25/16 0908 02/25/16 1037 02/25/16 1743 02/26/16 0455 02/26/16 1821 02/27/16 0536 02/27/16 0930 02/28/16 0440  NA 131*  --   --  134*  --  134* 137 137  K 4.5  --   --  3.7 3.5 3.4* 3.4* 3.9  CL 92*  --   --  98*  --  100* 98* 98*  CO2 27  --   --  29  --  28 32 33*  GLUCOSE 161*  --   --  140*  --  171* 121* 137*  BUN 55*  --   --  53*  --  43* 21* 33*  CREATININE 5.75*  --   --  4.80*  --  3.73* 1.99* 3.03*  CALCIUM 8.3*  --   --  7.8*  --  7.4* 7.7* 7.8*  MG  --  2.3 2.0 2.2 1.9 1.9  --   --   PHOS  --  5.4* 3.5 2.2* 1.3* 1.9* 1.5* 3.1    Liver Function  Tests:  Recent Labs Lab 02/27/16 0930  ALBUMIN 2.2*   No results for input(s): LIPASE, AMYLASE in the last 168 hours. No results for input(s): AMMONIA in the last 168 hours.  CBC:  Recent Labs Lab 02/23/16 0602 02/25/16 0908 02/26/16 0455 02/27/16 0536 02/28/16 0440  WBC 9.2 5.3 9.0 10.5 16.6*  HGB 8.1* 7.2* 6.5* 8.0* 9.4*  HCT 24.2* 21.8* 19.8* 24.1* 29.3*  MCV 83.0 83.5 82.9 86.3 87.4  PLT 254 221 231 209 271    Cardiac Enzymes: No results for input(s): CKTOTAL, CKMB, CKMBINDEX, TROPONINI in the last 168 hours.  BNP: Invalid input(s): POCBNP  CBG:  Recent Labs Lab 02/27/16 2130 02/27/16 2358 02/28/16 0354 02/28/16 0734 02/28/16 1140  GLUCAP 156* 213* 125* 54* 142*    Microbiology: Results for orders placed or performed during the hospital encounter of 02/05/2016  Blood culture (routine x 2)     Status: Abnormal   Collection Time: 01/30/2016 12:31 PM  Result Value Ref Range Status   Specimen Description BLOOD  RIGHT AC  Final   Special Requests   Final    BOTTLES DRAWN AEROBIC AND ANAEROBIC AER 11ML ANA 8ML   Culture  Setup Time   Final    GRAM POSITIVE COCCI AEROBIC BOTTLE ONLY CRITICAL RESULT CALLED TO, READ BACK BY AND VERIFIED WITH:  KAREN HAYES AT 0923 02/15/2016 SDR    Culture (A)  Final    FACKLAMIA HOMINIS Standardized susceptibility testing for this organism is not available. Performed at [facility], [place] [address] [place], [place] [address]    Report Status 02/17/2016 FINAL  Final  Blood Culture ID Panel (Reflexed)     Status: None   Collection Time: 02/03/2016 12:31 PM  Result Value Ref Range Status   Enterococcus species NOT DETECTED NOT DETECTED Final   Listeria monocytogenes NOT DETECTED NOT DETECTED Final   Staphylococcus species NOT DETECTED NOT DETECTED Final   Staphylococcus aureus NOT DETECTED NOT DETECTED Final   Streptococcus species NOT DETECTED NOT DETECTED Final   Streptococcus agalactiae NOT DETECTED NOT DETECTED Final    Streptococcus pneumoniae NOT DETECTED NOT DETECTED Final   Streptococcus pyogenes NOT DETECTED NOT DETECTED Final   Acinetobacter baumannii NOT DETECTED NOT DETECTED Final   Enterobacteriaceae species NOT DETECTED NOT DETECTED Final   Enterobacter cloacae complex NOT DETECTED NOT DETECTED Final   Escherichia coli NOT DETECTED NOT DETECTED Final   Klebsiella oxytoca NOT DETECTED NOT DETECTED Final   Klebsiella pneumoniae NOT DETECTED NOT DETECTED Final   Proteus species NOT DETECTED NOT DETECTED Final   Serratia marcescens NOT DETECTED NOT DETECTED Final   Haemophilus influenzae NOT DETECTED NOT DETECTED Final   Neisseria meningitidis NOT DETECTED NOT DETECTED Final   Pseudomonas aeruginosa NOT DETECTED NOT DETECTED Final   Candida albicans NOT DETECTED NOT DETECTED Final   Candida glabrata NOT DETECTED NOT DETECTED Final   Candida krusei NOT DETECTED NOT DETECTED Final   Candida parapsilosis NOT DETECTED NOT DETECTED Final   Candida tropicalis NOT DETECTED NOT DETECTED Final  Blood culture (routine x 2)     Status: None   Collection Time: 02/18/2016 12:32 PM  Result Value Ref Range Status   Specimen Description BLOOD LEFT HAND  Final   Special Requests   Final    BOTTLES DRAWN AEROBIC AND ANAEROBIC AER 13ML ANA 13ML   Culture NO GROWTH 5 DAYS  Final   Report Status 02/16/2016 FINAL  Final  MRSA PCR Screening     Status: None   Collection Time: 01/27/2016 10:32 PM  Result Value Ref Range Status   MRSA by PCR NEGATIVE NEGATIVE Final    Comment:        The GeneXpert MRSA Assay (FDA approved for NASAL specimens only), is one component of a comprehensive MRSA colonization surveillance program. It is not intended to diagnose MRSA infection nor to guide or monitor treatment for MRSA infections.   Culture, expectorated sputum-assessment     Status: None   Collection Time: 01/21/2016  3:24 PM  Result Value Ref Range Status   Specimen Description SPUTUM  Final   Special Requests  Normal  Final   Sputum evaluation   Final    Sputum specimen not acceptable for testing.  Please recollect.   RESULT CALLED TO, READ BACK BY AND VERIFIED WITH: Champ Mungo 01/21/2016 1608 KLW    Report Status 02/09/2016 FINAL  Final  Culture, blood (single) w Reflex to ID Panel     Status: None   Collection Time: 02/17/16  2:45 PM  Result Value Ref Range Status   Specimen Description BLOOD RIGHT ANTECUBITAL  Final   Special Requests   Final    BOTTLES DRAWN AEROBIC AND ANAEROBIC ANA 8CC AER [place]   Culture NO GROWTH 5 DAYS  Final   Report Status 02/22/2016 FINAL  Final  Culture, blood (Routine X 2) w Reflex to ID Panel     Status: None   Collection Time: 02/18/16  6:12 AM  Result Value Ref Range Status   Specimen Description BLOOD R HAND  Final   Special Requests BOTTLES DRAWN AEROBIC AND ANAEROBIC 1ML  Final   Culture NO GROWTH 5 DAYS  Final   Report Status 02/23/2016 FINAL  Final  Culture, blood (Routine X 2) w Reflex to ID Panel     Status: None   Collection Time: 02/18/16  6:12 AM  Result Value Ref Range Status   Specimen Description BLOOD LEFT HAND  Final   Special Requests BOTTLES DRAWN AEROBIC AND ANAEROBIC 4ML  Final   Culture NO GROWTH 5 DAYS  Final   Report Status 02/23/2016 FINAL  Final    Coagulation Studies:  Recent Labs  02/26/16 0455 02/27/16 0536 02/28/16 0440  LABPROT 40.7* 40.9* 27.0*  INR 4.09* 4.11* 2.45    Urinalysis: No results for input(s): COLORURINE, LABSPEC, PHURINE, GLUCOSEU, HGBUR, BILIRUBINUR, KETONESUR, PROTEINUR, UROBILINOGEN, NITRITE, LEUKOCYTESUR in the last 72 hours.  Invalid input(s): APPERANCEUR    Imaging: Dg Chest 1 View  Result Date: 02/28/2016 CLINICAL DATA:  Anticipating ETT removal. EXAM: CHEST 1 VIEW COMPARISON:  February 27, 2016 FINDINGS: The ETT is in good position. Other support apparatus is stable. A left apical pneumothorax measures 3 cm today versus 1.8 cm yesterday, a little larger in the interval. No evidence of  tension. No right-sided pneumothorax. Small effusions remain, particularly on the left with underlying atelectasis. The cardiomediastinal silhouette is stable. No overt edema. IMPRESSION: 1. The left apical pneumothorax is a little larger in the interval measuring 3 cm at the apex today versus 1.8 cm yesterday. 2. Support apparatus is stable. 3. Continued cardiomegaly. 4. Small effusions, particularly on the left with underlying atelectasis. These results will be called to the ordering clinician or representative by the Radiologist Assistant, and communication documented in the PACS or zVision Dashboard. Electronically Signed   By: Dorise Bullion III M.D   On: 02/28/2016 08:29   Dg Chest 1 View  Result Date: 02/27/2016 CLINICAL DATA:  Shortness of Breath EXAM: CHEST 1 VIEW COMPARISON:  02/26/2016 FINDINGS: Cardiac shadow remains enlarged. An endotracheal tube, nasogastric catheter and jugular dialysis catheter are again seen and stable. Left subclavian central line is again noted and stable. Bilateral pleural effusions are seen. No focal confluent infiltrate is noted. IMPRESSION: Stable small bilateral pleural effusions. Tubes and lines as described. Electronically Signed   By: Inez Catalina M.D.   On: 02/27/2016 07:07     Medications:   . sodium chloride    . feeding supplement (VITAL HIGH PROTEIN) 1,000 mL (02/28/16 0800)  . norepinephrine (LEVOPHED) Adult infusion Stopped (02/25/16 1405)  . propofol (DIPRIVAN) infusion Stopped (02/28/16 0820)   . amiodarone  200 mg Oral Daily  . arformoterol  15 mcg Nebulization BID  . budesonide (PULMICORT) nebulizer solution  0.25 mg Nebulization BID  . chlorhexidine gluconate (MEDLINE KIT)  15 mL Mouth Rinse BID  . citalopram  10 mg Per Tube Daily  . diltiazem  120 mg Oral Q12H  . epoetin (EPOGEN/PROCRIT) injection  10,000 Units Intravenous Q T,Th,Sa-HD  .  famotidine  20 mg Oral Daily  . feeding supplement (PRO-STAT SUGAR FREE 64)  30 mL Per Tube Daily   . insulin aspart  2-6 Units Subcutaneous Q4H  . mouth rinse  15 mL Mouth Rinse 10 times per day  . metoprolol tartrate  25 mg Oral Q12H  . pravastatin  20 mg Oral q1800  . senna-docusate  1 tablet Oral BID  . sodium chloride flush  3 mL Intravenous Q12H  . Warfarin - Pharmacist Dosing Inpatient   Does not apply q1800   sodium chloride, sodium chloride, albuterol, bisacodyl, fentaNYL (SUBLIMAZE) injection, guaiFENesin-dextromethorphan, metoprolol, midazolam, nitroGLYCERIN, [DISCONTINUED] ondansetron **OR** ondansetron (ZOFRAN) IV, senna-docusate  Assessment/ Plan:  Ms. Joan Erickson is a 83 y.o. 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 ESRD: with flash pulmonary edema. - No indication for dialysis today.  - Dialysis for tomorrow.    2. Hypertension: off vasopressors. Monitor blood pressure.   3. Anemia of chronic kidney disease: status post PRBC transfusion 2/7  - epo with TTS treatments.   4. Secondary Hyperparathyroidism: phos and calcium at goal. Not currently on binders.   5. Hyponatremia: secondary to chronic kidney disease.   6. Respiratory Failure: intubated and sedated. Now with pneumothorax - appreciate pulm input   LOS: [place], [place] 2/9/201811:56 AM

## 2016-02-28 NOTE — Progress Notes (Signed)
[place] for electrolyte, constipation, and glucose management   Pharmacy consulted for above for 83 yo female CKD patient on HD. Patient's current sedation regimen is propofol and fentanyl pushes. Patient receiving Vital High Protein at 70mL/hr. Patient also on warfarin therapy for Afib.   Plan: 1. Constipation: last documented bowel movement 2/7, will increase senna/docusate to 2 tab BID.   2. Glucose management: will order Lantus 10 units Q24hr and will continue SSI Q4hr.    No Known Allergies  Patient Measurements: Height: 5' (152.4 cm) Weight: 132 lb 4.4 oz (60 kg) IBW/kg (Calculated) : 45.5  Vital Signs: Temp: 98.7 F (37.1 C) (02/09 1200) Temp Source: Axillary (02/09 1200) BP: 98/66 (02/09 1400) Pulse Rate: 93 (02/09 1400) Intake/Output from previous day: 02/08 0701 - 02/09 0700 In: 1147.4 [I.V.:87.4; NG/GT:810; IV Piggyback:250] Out: 1710 [Urine:110] Intake/Output from this shift: Total I/O In: 502.6 [I.V.:7.6; NG/GT:495] Out: -   Labs:  Recent Labs  02/26/16 0455 02/26/16 1821 02/27/16 0536 02/27/16 0930 02/28/16 0440  WBC 9.0  --  10.5  --  16.6*  HGB 6.5*  --  8.0*  --  9.4*  HCT 19.8*  --  24.1*  --  29.3*  PLT 231  --  209  --  271  CREATININE 4.80*  --  3.73* 1.99* 3.03*  MG 2.2 1.9 1.9  --   --   PHOS 2.2* 1.3* 1.9* 1.5* 3.1  ALBUMIN  --   --   --  2.2*  --    Estimated Creatinine Clearance: 11.6 mL/min (by C-G formula based on SCr of 3.03 mg/dL (H)).   Medical History: Past Medical History:  Diagnosis Date  . Asthma   . Atrial fibrillation ([place])   . GERD (gastroesophageal reflux disease)   . History of colon polyps   . Hypercholesterolemia   . Hypertension   . Hypogammaglobulinemia ([facility])    IgA  . Warfarin anticoagulation   . Xanthogranulomatous pyelonephritis    s/p right nephrectomy    Pharmacy will continue to monitor and adjust per consult.   Alexcis Bicking L 02/28/2016,4:08 PM

## 2016-02-28 NOTE — Progress Notes (Signed)
Nutrition Follow-up  DOCUMENTATION CODES:   Not applicable  INTERVENTION:  -TF: nutritional needs reassess today; recommend continuing current TF regimen, Vital High Protein at rate of 45 ml/hr with Prostat daily. Additional kcals from diprivan at present. Meets 100% estimated calories and protein   NUTRITION DIAGNOSIS:   Inadequate oral intake related to acute illness as evidenced by per patient/family report.  Being addressed via TF  GOAL:   Patient will meet greater than or equal to 90% of their needs  MONITOR:   PO intake, Supplement acceptance, Labs, Weight trends  REASON FOR ASSESSMENT:   LOS    ASSESSMENT:   83 yo female admitted with sepsis with pneumonia, acute on CKD with hyperkalemia and hyponatremia, afib with RVR. 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, type 2 DM  Patient is currently intubated on ventilator support, plan for chest tube, currently not on seation MV: 6.8 L/min Temp (24hrs), Avg:99 F (37.2 C), Min:97.8 F (36.6 C), Max:99.6 F (37.6 C)  Tolerating Vital High Protein at rate of 45 ml/hr with Prostat daily Labs: phosphorus 3.1 (wdl) Meds: diprivan (3.8 ml/hr, 100 kcals)  Diet Order:  Diet - low sodium heart healthy Diet NPO time specified  Skin:  Reviewed, no issues  Last BM:  02/26/16  Height:   Ht Readings from Last 1 Encounters:  01/29/2016 5' (1.524 m)    Weight:   Wt Readings from Last 1 Encounters:  02/28/16 132 lb 4.4 oz (60 kg)    Ideal Body Weight:  45.45 kg  BMI:  Body mass index is 25.83 kg/m.  Estimated Nutritional Needs:   Kcal:  1223 kcals  Protein:  90-120 g  Fluid:  1000 mL plus UOP  EDUCATION NEEDS:   Education needs addressed  Kerman Passey MS, [place], LDN [phone] Pager  [phone] Weekend/On-Call Pager

## 2016-02-28 NOTE — Procedures (Signed)
PROCEDURE NOTE:  Left THORACOSTOMY TUBE PLACEMENT    Date: 02/28/2016,   [id number] MRN# [id number] [name]    Indication: Left Pneumothorax   A time-out was completed verifying correct patient, procedure, site, positioning, special catheter was available at the time of the procedure.  The patient was positioned appropriately for chest tube placement. The patient's  left chest was prepped and draped in sterile fashion at 4th IC space in MCL. 1% Lidocaine was used to anesthetize the surrounding skin area   A Cook catheter 8Fr tube was placed and was sutured securely to the skin and a sterile dressing applied. A pleurevac was attached to the chest tube and a chest x-ray obtained ensuring placement. Air bubbles were seen in the collection system.    Marda Stalker, M.D.

## 2016-02-28 NOTE — Progress Notes (Signed)
Dr. Juanell Fairly made aware of an increase in size of a Left pneumothorax and change in Orogastric CM at lips.

## 2016-02-28 NOTE — Progress Notes (Addendum)
ANTICOAGULATION CONSULT NOTE - Follow Up Consult  Pharmacy Consult for warfarin  Indication: atrial fibrillation   No Known Allergies  Patient Measurements: Height: 5' (152.4 cm) Weight: 132 lb 4.4 oz (60 kg) IBW/kg (Calculated) : 45.5  Vital Signs: Temp: 98.7 F (37.1 C) (02/09 1200) Temp Source: Axillary (02/09 1200) BP: 98/66 (02/09 1400) Pulse Rate: 93 (02/09 1400)  Labs:  Recent Labs  02/26/16 0455 02/27/16 0536 02/27/16 0930 02/28/16 0440  HGB 6.5* 8.0*  --  9.4*  HCT 19.8* 24.1*  --  29.3*  PLT 231 209  --  271  LABPROT 40.7* 40.9*  --  27.0*  INR 4.09* 4.11*  --  2.45  CREATININE 4.80* 3.73* 1.99* 3.03*     Estimated Creatinine Clearance: 11.6 mL/min (by C-G formula based on SCr of 3.03 mg/dL (H)).   Medical History: Past Medical History:  Diagnosis Date  . Asthma   . Atrial fibrillation ([place])   . GERD (gastroesophageal reflux disease)   . History of colon polyps   . Hypercholesterolemia   . Hypertension   . Hypogammaglobulinemia (H[facility])    IgA  . Warfarin anticoagulation   . Xanthogranulomatous pyelonephritis    s/p right nephrectomy    Medications:  Prescriptions Prior to Admission  Medication Sig Dispense Refill Last Dose  . acetaminophen (TYLENOL) 500 MG tablet Take 500 mg by mouth every 6 (six) hours as needed.   prn at prn  . ADVAIR DISKUS 250-50 MCG/DOSE AEPB INHALE 1 PUFF BY MOUTH TWICE A DAY 60 each 5 02/10/2016 at 2100  . chlorthalidone (HYGROTON) 25 MG tablet Take 0.5 tablets (12.5 mg total) by mouth daily. 30 tablet 2 02/18/2016 at 0900  . Cholecalciferol (VITAMIN D) 2000 UNITS tablet Take 2,000 Units by mouth daily.   01/30/2016 at 0900  . citalopram (CELEXA) 10 MG tablet Take 1 tablet (10 mg total) by mouth daily. 90 tablet 1 01/28/2016 at 0900  . diltiazem (TIAZAC) 360 MG 24 hr capsule Take 1 capsule (360 mg total) by mouth daily. 90 capsule 1 02/06/2016 at 0900  . finasteride (PROSCAR) 5 MG tablet    02/07/2016 at 0900  . fluticasone  (FLONASE) 50 MCG/ACT nasal spray USE 2 SPRAYS IN EACH NOSTRIL ONCE A DAY 16 g 5 02/10/2016 at Unknown time  . montelukast (SINGULAIR) 10 MG tablet Take 1 tablet (10 mg total) by mouth daily. 90 tablet 1 01/31/2016 at 0900  . pravastatin (PRAVACHOL) 20 MG tablet TAKE 1 TABLET (20 MG TOTAL) BY MOUTH DAILY. 30 tablet 8 02/10/2016 at 2100  . PROAIR HFA 108 (90 Base) MCG/ACT inhaler INHALE 2 PUFF EVERY SIX HOURS AS NEEDED 8.5 Inhaler 2 02/10/2016 at 0900  . TRADJENTA 5 MG TABS tablet TAKE 1 TABLET (5 MG TOTAL) BY MOUTH DAILY. 30 tablet 11 02/12/2016 at 0900  . warfarin (COUMADIN) 1 MG tablet Take 1 mg by mouth at bedtime. Pt takes 0.5 t in addition to the 5 mg dose  4 02/10/2016 at 2100  . warfarin (COUMADIN) 5 MG tablet Take 5 mg by mouth at bedtime. Pt takes 5 mg dose along with 0.5 of a 1 mg tab qhs   02/10/2016 at 2100  . Blood Glucose Monitoring Suppl (ONE TOUCH ULTRA SYSTEM KIT) W/DEVICE KIT 1 kit by Does not apply route once. 1 each 0 Taking  . eplerenone (INSPRA) 25 MG tablet TAKE 1 TABLET (25 MG TOTAL) BY MOUTH ONCE DAILY FOR 14 DAYS.  0 Taking  . fluticasone (FLONASE) 50 MCG/ACT nasal spray  USE 2 SPRAYS IN EACH NOSTRIL ONCE A DAY 16 g 1 Taking  . ONE TOUCH ULTRA TEST test strip CHECK BLOOD SUGAR 2 TIMES A DAY DX: E11.9 100 each 3 Taking   Scheduled:  . amiodarone  200 mg Per Tube Daily  . arformoterol  15 mcg Nebulization BID  . budesonide (PULMICORT) nebulizer solution  0.25 mg Nebulization BID  . chlorhexidine gluconate (MEDLINE KIT)  15 mL Mouth Rinse BID  . citalopram  10 mg Per Tube Daily  . diltiazem  120 mg Oral Q12H  . epoetin (EPOGEN/PROCRIT) injection  10,000 Units Intravenous Q T,Th,Sa-HD  . famotidine  20 mg Per Tube Daily  . feeding supplement (PRO-STAT SUGAR FREE 64)  30 mL Per Tube Daily  . insulin aspart  2-6 Units Subcutaneous Q4H  . mouth rinse  15 mL Mouth Rinse 10 times per day  . metoprolol tartrate  25 mg Per Tube Q12H  . pravastatin  20 mg Per Tube q1800  .  senna-docusate  1 tablet Per Tube BID  . sodium chloride flush  3 mL Intravenous Q12H  . Warfarin - Pharmacist Dosing Inpatient   Does not apply q1800    Assessment: Pharmacy consulted to dose and monitor warfarin therapy in this 83 year old woman with atrial fibrillation. Warfarin was held since 1/29 and patient is now s/p permcath placement. Patient receiving amiodarone 200 mg Daily. Home warfarin dose is 5.18m daily.   2/2  INR   1.29   Warfarin 5 mg 2/3  INR   1.24      5 mg 2/4  INR   1.43      5 mg 2/5: INR    1.81     552m 2/6: INR    2.28     63m78m  2/7: INR    4.09     Held  2/8: INR    4.11     Held    Goal of Therapy:  INR 2-3 Monitor platelets by anticoagulation protocol: Yes   Plan:  Will resume warfarin 2.63mg67m800. Will obtain INR with am labs.   Pharmacy will continue to monitor and adjust per consult.   Simpson,Michael L 02/28/2016 3:53 PM

## 2016-02-29 ENCOUNTER — Inpatient Hospital Stay: Payer: Medicare Other

## 2016-02-29 ENCOUNTER — Other Ambulatory Visit: Payer: Self-pay | Admitting: Internal Medicine

## 2016-02-29 DIAGNOSIS — Z992 Dependence on renal dialysis: Secondary | ICD-10-CM

## 2016-02-29 DIAGNOSIS — J90 Pleural effusion, not elsewhere classified: Secondary | ICD-10-CM

## 2016-02-29 DIAGNOSIS — L8990 Pressure ulcer of unspecified site, unspecified stage: Secondary | ICD-10-CM | POA: Insufficient documentation

## 2016-02-29 DIAGNOSIS — N186 End stage renal disease: Secondary | ICD-10-CM

## 2016-02-29 LAB — CBC WITH DIFFERENTIAL/PLATELET
BASOS PCT: 0 %
Basophils Absolute: 0 10*3/uL (ref 0–0.1)
EOS ABS: 0.1 10*3/uL (ref 0–0.7)
Eosinophils Relative: 1 %
HCT: 22.8 % — ABNORMAL LOW (ref 35.0–47.0)
Hemoglobin: 7.3 g/dL — ABNORMAL LOW (ref 12.0–16.0)
Lymphocytes Relative: 1 %
Lymphs Abs: 0.1 10*3/uL — ABNORMAL LOW (ref 1.0–3.6)
MCH: 27.8 pg (ref 26.0–34.0)
MCHC: 32.1 g/dL (ref 32.0–36.0)
MCV: 86.8 fL (ref 80.0–100.0)
MONO ABS: 1.2 10*3/uL — AB (ref 0.2–0.9)
Monocytes Relative: 12 %
NEUTROS PCT: 86 %
Neutro Abs: 9 10*3/uL — ABNORMAL HIGH (ref 1.4–6.5)
PLATELETS: 197 10*3/uL (ref 150–440)
RBC: 2.63 MIL/uL — ABNORMAL LOW (ref 3.80–5.20)
RDW: 17.6 % — ABNORMAL HIGH (ref 11.5–14.5)
WBC: 10.4 10*3/uL (ref 3.6–11.0)

## 2016-02-29 LAB — GLUCOSE, CAPILLARY
GLUCOSE-CAPILLARY: 147 mg/dL — AB (ref 65–99)
Glucose-Capillary: 134 mg/dL — ABNORMAL HIGH (ref 65–99)
Glucose-Capillary: 114 mg/dL — ABNORMAL HIGH (ref 65–99)
Glucose-Capillary: 115 mg/dL — ABNORMAL HIGH (ref 65–99)
Glucose-Capillary: 183 mg/dL — ABNORMAL HIGH (ref 65–99)
Glucose-Capillary: 113 mg/dL — ABNORMAL HIGH (ref 65–99)
Glucose-Capillary: 133 mg/dL — ABNORMAL HIGH (ref 65–99)

## 2016-02-29 LAB — RENAL FUNCTION PANEL
Albumin: 2 g/dL — ABNORMAL LOW (ref 3.5–5.0)
Anion gap: 7 (ref 5–15)
BUN: 62 mg/dL — ABNORMAL HIGH (ref 6–20)
CALCIUM: 7.9 mg/dL — AB (ref 8.9–10.3)
CO2: 32 mmol/L (ref 22–32)
CREATININE: 4.56 mg/dL — AB (ref 0.44–1.00)
Chloride: 98 mmol/L — ABNORMAL LOW (ref 101–111)
GFR calc non Af Amer: 8 mL/min — ABNORMAL LOW (ref >60)
GFR, EST AFRICAN AMERICAN: 9 mL/min — AB (ref >60)
Glucose, Bld: 133 mg/dL — ABNORMAL HIGH (ref 65–99)
Phosphorus: 3.5 mg/dL (ref 2.5–4.6)
Potassium: 3.9 mmol/L (ref 3.5–5.1)
Sodium: 137 mmol/L (ref 135–145)

## 2016-02-29 LAB — BASIC METABOLIC PANEL
Anion gap: 7 (ref 5–15)
BUN: 58 mg/dL — AB (ref 6–20)
CO2: 31 mmol/L (ref 22–32)
CREATININE: 4.29 mg/dL — AB (ref 0.44–1.00)
Calcium: 7.9 mg/dL — ABNORMAL LOW (ref 8.9–10.3)
Chloride: 99 mmol/L — ABNORMAL LOW (ref 101–111)
GFR calc Af Amer: 10 mL/min — ABNORMAL LOW (ref >60)
GFR calc non Af Amer: 9 mL/min — ABNORMAL LOW (ref >60)
Glucose, Bld: 144 mg/dL — ABNORMAL HIGH (ref 65–99)
POTASSIUM: 3.8 mmol/L (ref 3.5–5.1)
SODIUM: 137 mmol/L (ref 135–145)

## 2016-02-29 LAB — BLOOD GAS, ARTERIAL
Acid-Base Excess: 12.1 mmol/L — ABNORMAL HIGH (ref 0.0–2.0)
BICARBONATE: 37.1 mmol/L — AB (ref 20.0–28.0)
FIO2: 0.28
O2 SAT: 96.6 %
PATIENT TEMPERATURE: 37
PEEP: 5 cmH2O
PO2 ART: 81 mmHg — AB (ref 83.0–108.0)
Pressure support: 10 cmH2O
pCO2 arterial: 51 mmHg — ABNORMAL HIGH (ref 32.0–48.0)
pH, Arterial: 7.47 — ABNORMAL HIGH (ref 7.350–7.450)

## 2016-02-29 LAB — PHOSPHORUS: Phosphorus: 3.1 mg/dL (ref 2.5–4.6)

## 2016-02-29 LAB — PROTIME-INR
INR: 3
PROTHROMBIN TIME: 31.8 s — AB (ref 11.4–15.2)

## 2016-02-29 LAB — PROCALCITONIN: Procalcitonin: 2.62 ng/mL

## 2016-02-29 LAB — MAGNESIUM: Magnesium: 2.1 mg/dL (ref 1.7–2.4)

## 2016-02-29 MED ORDER — FENTANYL CITRATE (PF) 100 MCG/2ML IJ SOLN: 50.0000 ug | INTRAMUSCULAR | Status: DC | PRN

## 2016-02-29 MED ORDER — LORAZEPAM 2 MG/ML IJ SOLN
0.5000 mg | INTRAMUSCULAR | Status: DC | PRN
  Administered 2016-02-29: 1 mg via INTRAVENOUS

## 2016-02-29 MED ORDER — VECURONIUM BROMIDE 10 MG IV SOLR
10.0000 mg | INTRAVENOUS | Status: AC
  Administered 2016-02-29: 10 mg via INTRAVENOUS

## 2016-02-29 MED ORDER — STERILE WATER FOR INJECTION IJ SOLN
INTRAMUSCULAR | Status: AC
  Filled 2016-02-29: qty 10

## 2016-02-29 MED ORDER — VECURONIUM BROMIDE 10 MG IV SOLR
INTRAVENOUS | Status: AC
  Administered 2016-02-29: 10 mg via INTRAVENOUS
  Filled 2016-02-29: qty 10

## 2016-02-29 MED ORDER — MIDAZOLAM HCL 2 MG/2ML IJ SOLN
1.0000 mg | INTRAMUSCULAR | Status: DC | PRN
  Administered 2016-03-01: 1 mg via INTRAVENOUS
  Filled 2016-02-29: qty 2

## 2016-02-29 MED ORDER — VITAL HIGH PROTEIN PO LIQD
1000.0000 mL | ORAL | Status: DC
  Administered 2016-02-29: 1000 mL
  Administered 2016-03-01 (×2)
  Administered 2016-03-01: 1000 mL
  Administered 2016-03-01 – 2016-03-02 (×9)

## 2016-02-29 MED ORDER — MIDAZOLAM HCL 2 MG/2ML IJ SOLN: 1.0000 mg | INTRAMUSCULAR | Status: DC | PRN

## 2016-02-29 MED ORDER — LORAZEPAM 2 MG/ML IJ SOLN
INTRAMUSCULAR | Status: AC
  Filled 2016-02-29: qty 1

## 2016-02-29 MED ORDER — FENTANYL CITRATE (PF) 100 MCG/2ML IJ SOLN
50.0000 ug | INTRAMUSCULAR | Status: DC | PRN
  Administered 2016-02-29 – 2016-03-01 (×5): 50 ug via INTRAVENOUS
  Filled 2016-02-29 (×5): qty 2

## 2016-02-29 NOTE — Progress Notes (Signed)
[name] for electrolyte, constipation, and glucose management   Pharmacy consulted for above for 83 yo female CKD patient on HD. Patient's current sedation regimen is propofol and fentanyl pushes. Patient receiving Vital High Protein at 63mL/hr. Patient also on warfarin therapy for Afib.   Plan: 1. Constipation: last documented bowel movement 2/9. Continue current therapy.  2. Glucose management: will continue Lantus 10 units Q24hr and will continue SSI Q4hr.   3. Zosyn: Continue Zosyn 3.375 g IV q12 hours.   No Known Allergies  Patient Measurements: Height: 5' (152.4 cm) Weight: 132 lb 15 oz (60.3 kg) IBW/kg (Calculated) : 45.5  Vital Signs: Temp: 98.4 F (36.9 C) (02/10 0800) Temp Source: Axillary (02/10 0800) BP: 108/65 (02/10 0800) Pulse Rate: 101 (02/10 0800) Intake/Output from previous day: 02/09 0701 - 02/10 0700 In: 695.6 [I.V.:10.6; NG/GT:585; IV Piggyback:100] Out: 30 [Urine:30] Intake/Output from this shift: Total I/O In: 720 [NG/GT:720] Out: -   Labs:  Recent Labs  02/26/16 1821  02/27/16 0536 02/27/16 0930 02/28/16 0440 02/29/16 0500  WBC  --   --  10.5  --  16.6* 10.4  HGB  --   --  8.0*  --  9.4* 7.3*  HCT  --   --  24.1*  --  29.3* 22.8*  PLT  --   --  209  --  271 197  CREATININE  --   < > 3.73* 1.99* 3.03* 4.29*  MG 1.9  --  1.9  --   --  2.1  PHOS 1.3*  --  1.9* 1.5* 3.1 3.1  ALBUMIN  --   --   --  2.2*  --   --   < > = values in this interval not displayed. Estimated Creatinine Clearance: 8.2 mL/min (by C-G formula based on SCr of 4.29 mg/dL (H)).   Medical History: Past Medical History:  Diagnosis Date  . Asthma   . Atrial fibrillation ([place])   . GERD (gastroesophageal reflux disease)   . History of colon polyps   . Hypercholesterolemia   . Hypertension   . Hypogammaglobulinemia ([facility])    IgA  . Warfarin anticoagulation   . Xanthogranulomatous pyelonephritis    s/p right nephrectomy     Pharmacy will continue to monitor and adjust per consult.   Joan Erickson D 02/29/2016,9:37 AM

## 2016-02-29 NOTE — Discharge Summary (Signed)
Physician Discharge Summary  Patient ID: Joan Erickson MRN: [id number] DOB/AGE: [birth year] 81 y.o.  Admit date: 02/06/2016 Discharge date: 02/29/2016  Admission Diagnoses: Acute renal failure Pneumonia with sepsis.  Afib with RVR.  Acute renal failure.   Discharge Diagnoses:  Active Problems:   ARF (acute renal failure) (HCC)   Spontaneous pneumothorax New onset HD TTS.  Pleural effusions with volume overload.  Acute respiratory failure with pulmonary edema due to volume overload requiring intubation.   Discharged Condition: serious  Hospital Course:  89 F with history of CAF, asthma, CKD admitted 02/19/2016 with SOB and atrial flutter. Underwent temporary HD cath insertion 01/26. Transferred to ICU/SDU 02/01 with atrial flutter and rapid ventricular response. Underwent tunneled catheter placement 02/01. She was hypercarbic after that procedure and was transiently on BiPAP. Transferred to back to telemetry floor on 2/3. She has been maintained on cardizem drip, Followed by nephrology, undergoing ultrafiltration and her oxygen had been weaned down to 2 L., She had been planned for discharge on 2/06 on oxygen 2 L at rest, with 6 L when ambulating. However, early  Morning 2/6, the patient was found to be very short of breath with diminished responsiveness, RR was called. She was transferred to ICU where she was quickly intubated and a left subclavian central line placed She was noted to be in Afib with RVR and had florid pulmonary edema, she underwent HD over subsequent days but it was noted on both 2/7 and 2/8 that she would only tolerate weaning trial for 15-20 min and would become tachypneic.  On 2/09 it was noted that she had a moderate, 3 cm left side ptx. A left pigtail chest tube was placed with good resolution.  On 2/10 family requested transfer to [facility], she was dialyzed further this day and tolerated weaning trial much better and decision was taken to extubate.    Consults: ID,  nephrology, vascular surgery.   Significant Diagnostic Studies: CT chest.   Treatments: HD.   Discharge Exam: Blood pressure (!) 96/56, pulse 86, temperature 97.8 F (36.6 C), temperature source Axillary, resp. rate (!) 21, height 5' (1.524 m), weight 146 lb 2.6 oz (66.3 kg), SpO2 99 %.   Disposition: Transferred.   Signed: Laverle Hobby 02/29/2016, 1:44 PM

## 2016-02-29 NOTE — Progress Notes (Signed)
Patient extubated to 2LPM [place] per verbal order from Dr. Juanell Fairly.  Patient suctioned prior to extubation, extubated and placed on 2LPM [place] without complication.  Will continue to monitor.

## 2016-02-29 NOTE — Progress Notes (Signed)
ETT withdrawn 1cm to 22 at lip without complication.

## 2016-02-29 NOTE — Progress Notes (Signed)
HD COMPLETED  

## 2016-02-29 NOTE — Progress Notes (Signed)
POST DIALYSIS ASSESSMENT 

## 2016-02-29 NOTE — Progress Notes (Addendum)
eLink Physician-Brief Progress Note Patient Name: Joan Erickson DOB: Feb 11, 1931 MRN: [id number]   Date of Service  02/29/2016  HPI/Events of Note  Camera check on patient postextubation. Patient sitting up in bed. Desaturated and was placed on nonrebreather mask postextubation. Daughter also at bedside. Patient currently normotensive with atrial fibrillation on telemetry. Patient remains full CODE STATUS. Suspect some element of flash pulmonary edema with concentric left ventricular hypertrophy. Patient did undergo hemodialysis today. Chest tube remains to suction.   eICU Interventions  1. Continue close ICU monitoring 2. Low threshold to repeat intubate for any further respiratory decline  3. NIPPV if necessary to bridge to intubation/evaluation by intensivist     Intervention Category Major Interventions: Respiratory failure - evaluation and management  Tera Partridge 02/29/2016, 3:55 PM

## 2016-02-29 NOTE — Progress Notes (Signed)
[facility]  ROUNDING NOTE   Subjective:   Daughter bedside.  Chest tube placed yesterday. On Pressure support with FiO2 28%  Hemodialysis for today.   Objective:  Vital signs in last 24 hours:  Temp:  [97.9 F (36.6 C)-98.8 F (37.1 C)] 97.9 F (36.6 C) (02/10 0930) Pulse Rate:  [81-126] 104 (02/10 1000) Resp:  [0-26] 20 (02/10 1000) BP: (98-143)/(50-90) 105/71 (02/10 1000) SpO2:  [95 %-100 %] 99 % (02/10 0800) FiO2 (%):  [28 %-30 %] 28 % (02/10 0931) Weight:  [60.3 kg (132 lb 15 oz)-66.3 kg (146 lb 2.6 oz)] 66.3 kg (146 lb 2.6 oz) (02/10 0930)  Weight change: 0.3 kg (10.6 oz) Filed Weights   02/28/16 0442 02/29/16 0301 02/29/16 0930  Weight: 60 kg (132 lb 4.4 oz) 60.3 kg (132 lb 15 oz) 66.3 kg (146 lb 2.6 oz)    Intake/Output: I/O last 3 completed shifts: In: 1097.4 [I.V.:52.4; NG/GT:945; IV Piggyback:100] Out: 80 [Urine:80]   Intake/Output this shift:  Total I/O In: 887.3 [NG/GT:887.3] Out: -   Physical Exam: General: Critically ill  Head: ETT  Eyes: Anicteric, PERRL  Neck: Supple, trachea midline  Lungs:  Clear, mechanical ventilation PS FiO2 28%  Heart: Regular rate and rhythm  Abdomen:  Soft, nontender,   Extremities: 1+ peripheral edema.  Neurologic: Nonfocal, moving all four extremities  Skin: No lesions  Access: RIJ permcath    Basic Metabolic Panel:  Recent Labs Lab 02/25/16 1743 02/26/16 0455 02/26/16 1821 02/27/16 0536 02/27/16 0930 02/28/16 0440 02/29/16 0500  NA  --  134*  --  134* 137 137 137  K  --  3.7 3.5 3.4* 3.4* 3.9 3.8  CL  --  98*  --  100* 98* 98* 99*  CO2  --  29  --  28 32 33* 31  GLUCOSE  --  140*  --  171* 121* 137* 144*  BUN  --  53*  --  43* 21* 33* 58*  CREATININE  --  4.80*  --  3.73* 1.99* 3.03* 4.29*  CALCIUM  --  7.8*  --  7.4* 7.7* 7.8* 7.9*  MG 2.0 2.2 1.9 1.9  --   --  2.1  PHOS 3.5 2.2* 1.3* 1.9* 1.5* 3.1 3.1    Liver Function Tests:  Recent Labs Lab 02/27/16 0930  ALBUMIN 2.2*   No  results for input(s): LIPASE, AMYLASE in the last 168 hours. No results for input(s): AMMONIA in the last 168 hours.  CBC:  Recent Labs Lab 02/25/16 0908 02/26/16 0455 02/27/16 0536 02/28/16 0440 02/29/16 0500  WBC 5.3 9.0 10.5 16.6* 10.4  NEUTROABS  --   --   --   --  9.0*  HGB 7.2* 6.5* 8.0* 9.4* 7.3*  HCT 21.8* 19.8* 24.1* 29.3* 22.8*  MCV 83.5 82.9 86.3 87.4 86.8  PLT 221 231 209 271 197    Cardiac Enzymes: No results for input(s): CKTOTAL, CKMB, CKMBINDEX, TROPONINI in the last 168 hours.  BNP: Invalid input(s): POCBNP  CBG:  Recent Labs Lab 02/28/16 0734 02/28/16 1140 02/28/16 1541 02/28/16 1943 02/29/16 0728  GLUCAP 123* 142* 155* 126* 114*    Microbiology: Results for orders placed or performed during the hospital encounter of 02/02/2016  Blood culture (routine x 2)     Status: Abnormal   Collection Time: 02/06/2016 12:31 PM  Result Value Ref Range Status   Specimen Description BLOOD RIGHT [facility]  Final   Special Requests   Final    BOTTLES  DRAWN AEROBIC AND ANAEROBIC AER 11ML ANA 8ML   Culture  Setup Time   Final    GRAM POSITIVE COCCI AEROBIC BOTTLE ONLY CRITICAL RESULT CALLED TO, READ BACK BY AND VERIFIED WITH:  [place] AT 0141 02/12/2016 SDR    Culture (A)  Final    FACKLAMIA HOMINIS Standardized susceptibility testing for this organism is not available. Performed at [facility], [place] [address], [place], [place] [address]    Report Status 02/17/2016 FINAL  Final  Blood Culture ID Panel (Reflexed)     Status: None   Collection Time: 02/19/2016 12:31 PM  Result Value Ref Range Status   Enterococcus species NOT DETECTED NOT DETECTED Final   Listeria monocytogenes NOT DETECTED NOT DETECTED Final   Staphylococcus species NOT DETECTED NOT DETECTED Final   Staphylococcus aureus NOT DETECTED NOT DETECTED Final   Streptococcus species NOT DETECTED NOT DETECTED Final   Streptococcus agalactiae NOT DETECTED NOT DETECTED Final   Streptococcus  pneumoniae NOT DETECTED NOT DETECTED Final   Streptococcus pyogenes NOT DETECTED NOT DETECTED Final   Acinetobacter baumannii NOT DETECTED NOT DETECTED Final   Enterobacteriaceae species NOT DETECTED NOT DETECTED Final   Enterobacter cloacae complex NOT DETECTED NOT DETECTED Final   Escherichia coli NOT DETECTED NOT DETECTED Final   Klebsiella oxytoca NOT DETECTED NOT DETECTED Final   Klebsiella pneumoniae NOT DETECTED NOT DETECTED Final   Proteus species NOT DETECTED NOT DETECTED Final   Serratia marcescens NOT DETECTED NOT DETECTED Final   Haemophilus influenzae NOT DETECTED NOT DETECTED Final   Neisseria meningitidis NOT DETECTED NOT DETECTED Final   Pseudomonas aeruginosa NOT DETECTED NOT DETECTED Final   Candida albicans NOT DETECTED NOT DETECTED Final   Candida glabrata NOT DETECTED NOT DETECTED Final   Candida krusei NOT DETECTED NOT DETECTED Final   Candida parapsilosis NOT DETECTED NOT DETECTED Final   Candida tropicalis NOT DETECTED NOT DETECTED Final  Blood culture (routine x 2)     Status: None   Collection Time: 02/04/2016 12:32 PM  Result Value Ref Range Status   Specimen Description BLOOD LEFT HAND  Final   Special Requests   Final    BOTTLES DRAWN AEROBIC AND ANAEROBIC AER 13ML ANA 13ML   Culture NO GROWTH 5 DAYS  Final   Report Status 02/16/2016 FINAL  Final  MRSA PCR Screening     Status: None   Collection Time: 01/28/2016 10:32 PM  Result Value Ref Range Status   MRSA by PCR NEGATIVE NEGATIVE Final    Comment:        The GeneXpert MRSA Assay (FDA approved for NASAL specimens only), is one component of a comprehensive MRSA colonization surveillance program. It is not intended to diagnose MRSA infection nor to guide or monitor treatment for MRSA infections.   Culture, expectorated sputum-assessment     Status: None   Collection Time: 01/20/2016  3:24 PM  Result Value Ref Range Status   Specimen Description SPUTUM  Final   Special Requests Normal  Final    Sputum evaluation   Final    Sputum specimen not acceptable for testing.  Please recollect.   RESULT CALLED TO, READ BACK BY AND VERIFIED WITH: Champ Mungo 02/08/2016 1608 KLW    Report Status 02/12/2016 FINAL  Final  Culture, blood (single) w Reflex to ID Panel     Status: None   Collection Time: 02/17/16  2:45 PM  Result Value Ref Range Status   Specimen Description BLOOD RIGHT ANTECUBITAL  Final  Special Requests   Final    BOTTLES DRAWN AEROBIC AND ANAEROBIC ANA 8CC AER [place]   Culture NO GROWTH 5 DAYS  Final   Report Status 02/22/2016 FINAL  Final  Culture, blood (Routine X 2) w Reflex to ID Panel     Status: None   Collection Time: 02/18/16  6:12 AM  Result Value Ref Range Status   Specimen Description BLOOD R HAND  Final   Special Requests BOTTLES DRAWN AEROBIC AND ANAEROBIC 1ML  Final   Culture NO GROWTH 5 DAYS  Final   Report Status 02/23/2016 FINAL  Final  Culture, blood (Routine X 2) w Reflex to ID Panel     Status: None   Collection Time: 02/18/16  6:12 AM  Result Value Ref Range Status   Specimen Description BLOOD LEFT HAND  Final   Special Requests BOTTLES DRAWN AEROBIC AND ANAEROBIC 4ML  Final   Culture NO GROWTH 5 DAYS  Final   Report Status 02/23/2016 FINAL  Final  Culture, respiratory (NON-Expectorated)     Status: None (Preliminary result)   Collection Time: 02/28/16  6:00 PM  Result Value Ref Range Status   Specimen Description TRACHEAL ASPIRATE  Final   Special Requests NONE  Final   Gram Stain PENDING  Incomplete   Culture   Final    CULTURE REINCUBATED FOR BETTER GROWTH Performed at [facility], [place] [address], [place], [place] [address]    Report Status PENDING  Incomplete    Coagulation Studies:  Recent Labs  02/27/16 0536 02/28/16 0440 02/29/16 0500  LABPROT 40.9* 27.0* 31.8*  INR 4.11* 2.45 3.00    Urinalysis: No results for input(s): COLORURINE, LABSPEC, PHURINE, GLUCOSEU, HGBUR, BILIRUBINUR, KETONESUR, PROTEINUR, UROBILINOGEN,  NITRITE, LEUKOCYTESUR in the last 72 hours.  Invalid input(s): APPERANCEUR    Imaging: Dg Chest 1 View  Result Date: 02/28/2016 CLINICAL DATA:  Post chest tube EXAM: CHEST 1 VIEW COMPARISON:  02/28/2016 FINDINGS: Interval placement of small bore chest tube on the left. Decreasing left apical pneumothorax, now small, less than 5%. Cardiomegaly with vascular congestion. Bilateral lower lobe opacities and layering effusions, increased since prior study. Remainder of the support devices are stable. IMPRESSION: Interval placement of left chest tube with near complete resolution of the left pneumothorax. Increasing bibasilar opacities and layering effusions. Electronically Signed   By: Rolm Baptise M.D.   On: 02/28/2016 12:02   Dg Chest 1 View  Result Date: 02/28/2016 CLINICAL DATA:  Anticipating ETT removal. EXAM: CHEST 1 VIEW COMPARISON:  February 27, 2016 FINDINGS: The ETT is in good position. Other support apparatus is stable. A left apical pneumothorax measures 3 cm today versus 1.8 cm yesterday, a little larger in the interval. No evidence of tension. No right-sided pneumothorax. Small effusions remain, particularly on the left with underlying atelectasis. The cardiomediastinal silhouette is stable. No overt edema. IMPRESSION: 1. The left apical pneumothorax is a little larger in the interval measuring 3 cm at the apex today versus 1.8 cm yesterday. 2. Support apparatus is stable. 3. Continued cardiomegaly. 4. Small effusions, particularly on the left with underlying atelectasis. These results will be called to the ordering clinician or representative by the Radiologist Assistant, and communication documented in the PACS or zVision Dashboard. Electronically Signed   By: Dorise Bullion III M.D   On: 02/28/2016 08:29   Ct Chest Wo Contrast  Result Date: 02/28/2016 CLINICAL DATA:  Dyspnea atrial fibrillation EXAM: CT CHEST WITHOUT CONTRAST TECHNIQUE: Multidetector CT imaging of the chest  was performed  following the standard protocol without IV contrast. COMPARISON:  Chest x-ray 02/28/2016, CT chest 11/21/2014 FINDINGS: Cardiovascular: Limited evaluation without contrast. Atherosclerotic calcifications of the aorta. No aneurysm. Coronary artery calcifications. Cardiomegaly. No large pericardial effusion. Left-sided central venous catheter tip terminates in the SVC. Right-sided central venous catheter tip terminates in the proximal right atrium. Mediastinum/Nodes: Endotracheal tube is present, the tip is approximately 2.8 cm superior to the carina. Esophageal tube is within the stomach but the tip is non imaged. Enlarged thyroid with multiple masses. 3.1 cm heterogenous mass lower pole left thyroid, a mass was present on the prior CT. Limited evaluation for hilar adenopathy. Few prominent mediastinal lymph nodes. Lungs/Pleura: Small moderate bilateral pleural effusions. Tiny left apical pneumothorax. Left-sided anterior chest tube with tip positioned along the anterior, medial pleural surface. Stable nodularity in the apical portion of right upper lobe. Scattered foci of ground-glass density and centrilobular opacification within the right middle lobe, lingula, and right upper lobes suggesting respiratory infection/bronchiolitis. Upper Abdomen: Small amount of ascites in the upper abdomen. Surgical clips in the right retroperitoneum. Musculoskeletal: Small amount of subcutaneous emphysema in the left anterior chest wall. Degenerative changes. No acute or suspicious bone lesions. IMPRESSION: 1. Small moderate right greater than left pleural effusions. Scattered foci of ground-glass density and centrilobular density suggestive of respiratory infection/bronchiolitis. 2. Left-sided chest tube is in place. Tiny left apical pneumothorax. 3. Enlarged thyroid gland with multiple masses. This was noted on prior CT. Electronically Signed   By: Donavan Foil M.D.   On: 02/28/2016 22:07   Dg Chest Port 1 View  Result Date:  02/29/2016 CLINICAL DATA:  Acute respiratory failure EXAM: PORTABLE CHEST 1 VIEW COMPARISON:  Chest radiograph from one day prior. FINDINGS: Endotracheal tube tip is 3.1 cm above the carina. Enteric tube enters stomach with the tip not seen on this image. Left subclavian central venous catheter terminates in the lower third of the superior vena cava. Right internal jugular central venous catheter terminates at the cavoatrial junction. Left chest tube terminates over the left lower pleural space. Stable cardiomediastinal silhouette with mild cardiomegaly and aortic atherosclerosis. No pneumothorax. Stable small bilateral pleural effusions. No overt pulmonary edema. Stable mild bibasilar lung opacities. IMPRESSION: 1. Support structures as described.  No pneumothorax . 2. Stable cardiomegaly without overt pulmonary edema. 3. Stable small bilateral pleural effusions and mild bibasilar lung opacities, favor atelectasis. Electronically Signed   By: Ilona Sorrel M.D.   On: 02/29/2016 07:02     Medications:   . sodium chloride    . feeding supplement (VITAL HIGH PROTEIN) 1,000 mL (02/29/16 1003)  . norepinephrine (LEVOPHED) Adult infusion Stopped (02/25/16 1405)  . propofol (DIPRIVAN) infusion Stopped (02/28/16 0820)   . amiodarone  200 mg Per Tube Daily  . arformoterol  15 mcg Nebulization BID  . budesonide (PULMICORT) nebulizer solution  0.25 mg Nebulization BID  . chlorhexidine gluconate (MEDLINE KIT)  15 mL Mouth Rinse BID  . citalopram  10 mg Per Tube Daily  . diltiazem  120 mg Oral Q12H  . epoetin (EPOGEN/PROCRIT) injection  10,000 Units Intravenous Q T,Th,Sa-HD  . famotidine  20 mg Per Tube Daily  . feeding supplement (PRO-STAT SUGAR FREE 64)  30 mL Per Tube Daily  . insulin aspart  2-6 Units Subcutaneous Q4H  . insulin glargine  10 Units Subcutaneous QHS  . mouth rinse  15 mL Mouth Rinse 10 times per day  . metoprolol tartrate  25 mg Per Tube Q12H  .  piperacillin-tazobactam (ZOSYN)  IV   3.375 g Intravenous Q12H  . pravastatin  20 mg Per Tube q1800  . senna-docusate  2 tablet Per Tube BID  . sodium chloride flush  3 mL Intravenous Q12H  . Warfarin - Pharmacist Dosing Inpatient   Does not apply q1800   sodium chloride, sodium chloride, albuterol, bisacodyl, fentaNYL (SUBLIMAZE) injection, guaiFENesin-dextromethorphan, lip balm, metoprolol, midazolam, nitroGLYCERIN, [DISCONTINUED] ondansetron **OR** ondansetron (ZOFRAN) IV, senna-docusate  Assessment/ Plan:  Ms. Joan Erickson is a 83 y.o. 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 ESRD: TTS schedule - Dialysis for today. Orders prepared.   2. Hypertension with atrial fibrillation: off vasopressors. Monitor blood pressure and heart rate.   3. Anemia of chronic kidney disease: status post PRBC transfusion 2/7. Hemoglobin 7.3  - epo with TTS treatments.   4. Secondary Hyperparathyroidism: phos and calcium at goal. Not currently on binders.   5. Hyponatremia: improved with dialysis  6. Respiratory Failure: intubated and sedated. with pneumothorax and pleural effusions.  - appreciate pulm input. Chest tube placed on on 2/9.  - Weaning for today.    LOS: 18 Modesty Rudy 2/10/201810:09 AM

## 2016-02-29 NOTE — Progress Notes (Signed)
HD STARTED  

## 2016-02-29 NOTE — Progress Notes (Signed)
PRE DIALYSIS ASSESSMENT 

## 2016-02-29 NOTE — Progress Notes (Signed)
ANTICOAGULATION CONSULT NOTE - Follow Up Consult  Pharmacy Consult for warfarin  Indication: atrial fibrillation   No Known Allergies  Patient Measurements: Height: 5' (152.4 cm) Weight: 132 lb 15 oz (60.3 kg) IBW/kg (Calculated) : 45.5  Vital Signs: Temp: 98.4 F (36.9 C) (02/10 0800) Temp Source: Axillary (02/10 0800) BP: 108/65 (02/10 0800) Pulse Rate: 101 (02/10 0800)  Labs:  Recent Labs  02/27/16 0536 02/27/16 0930 02/28/16 0440 02/29/16 0500  HGB 8.0*  --  9.4* 7.3*  HCT 24.1*  --  29.3* 22.8*  PLT 209  --  271 197  LABPROT 40.9*  --  27.0* 31.8*  INR 4.11*  --  2.45 3.00  CREATININE 3.73* 1.99* 3.03* 4.29*     Estimated Creatinine Clearance: 8.2 mL/min (by C-G formula based on SCr of 4.29 mg/dL (H)).   Medical History: Past Medical History:  Diagnosis Date  . Asthma   . Atrial fibrillation ([place])   . GERD (gastroesophageal reflux disease)   . History of colon polyps   . Hypercholesterolemia   . Hypertension   . Hypogammaglobulinemia (H[facility])    IgA  . Warfarin anticoagulation   . Xanthogranulomatous pyelonephritis    s/p right nephrectomy    Medications:  Prescriptions Prior to Admission  Medication Sig Dispense Refill Last Dose  . acetaminophen (TYLENOL) 500 MG tablet Take 500 mg by mouth every 6 (six) hours as needed.   prn at prn  . ADVAIR DISKUS 250-50 MCG/DOSE AEPB INHALE 1 PUFF BY MOUTH TWICE A DAY 60 each 5 02/10/2016 at 2100  . chlorthalidone (HYGROTON) 25 MG tablet Take 0.5 tablets (12.5 mg total) by mouth daily. 30 tablet 2 01/30/2016 at 0900  . Cholecalciferol (VITAMIN D) 2000 UNITS tablet Take 2,000 Units by mouth daily.   01/28/2016 at 0900  . citalopram (CELEXA) 10 MG tablet Take 1 tablet (10 mg total) by mouth daily. 90 tablet 1 01/30/2016 at 0900  . diltiazem (TIAZAC) 360 MG 24 hr capsule Take 1 capsule (360 mg total) by mouth daily. 90 capsule 1 01/29/2016 at 0900  . finasteride (PROSCAR) 5 MG tablet    01/31/2016 at 0900  . fluticasone  (FLONASE) 50 MCG/ACT nasal spray USE 2 SPRAYS IN EACH NOSTRIL ONCE A DAY 16 g 5 02/10/2016 at Unknown time  . montelukast (SINGULAIR) 10 MG tablet Take 1 tablet (10 mg total) by mouth daily. 90 tablet 1 02/03/2016 at 0900  . pravastatin (PRAVACHOL) 20 MG tablet TAKE 1 TABLET (20 MG TOTAL) BY MOUTH DAILY. 30 tablet 8 02/10/2016 at 2100  . PROAIR HFA 108 (90 Base) MCG/ACT inhaler INHALE 2 PUFF EVERY SIX HOURS AS NEEDED 8.5 Inhaler 2 02/10/2016 at 0900  . TRADJENTA 5 MG TABS tablet TAKE 1 TABLET (5 MG TOTAL) BY MOUTH DAILY. 30 tablet 11 01/27/2016 at 0900  . warfarin (COUMADIN) 1 MG tablet Take 1 mg by mouth at bedtime. Pt takes 0.5 t in addition to the 5 mg dose  4 02/10/2016 at 2100  . warfarin (COUMADIN) 5 MG tablet Take 5 mg by mouth at bedtime. Pt takes 5 mg dose along with 0.5 of a 1 mg tab qhs   02/10/2016 at 2100  . Blood Glucose Monitoring Suppl (ONE TOUCH ULTRA SYSTEM KIT) W/DEVICE KIT 1 kit by Does not apply route once. 1 each 0 Taking  . eplerenone (INSPRA) 25 MG tablet TAKE 1 TABLET (25 MG TOTAL) BY MOUTH ONCE DAILY FOR 14 DAYS.  0 Taking  . fluticasone (FLONASE) 50 MCG/ACT nasal spray  USE 2 SPRAYS IN EACH NOSTRIL ONCE A DAY 16 g 1 Taking  . ONE TOUCH ULTRA TEST test strip CHECK BLOOD SUGAR 2 TIMES A DAY DX: E11.9 100 each 3 Taking   Scheduled:  . amiodarone  200 mg Per Tube Daily  . arformoterol  15 mcg Nebulization BID  . budesonide (PULMICORT) nebulizer solution  0.25 mg Nebulization BID  . chlorhexidine gluconate (MEDLINE KIT)  15 mL Mouth Rinse BID  . citalopram  10 mg Per Tube Daily  . diltiazem  120 mg Oral Q12H  . epoetin (EPOGEN/PROCRIT) injection  10,000 Units Intravenous Q T,Th,Sa-HD  . famotidine  20 mg Per Tube Daily  . feeding supplement (PRO-STAT SUGAR FREE 64)  30 mL Per Tube Daily  . insulin aspart  2-6 Units Subcutaneous Q4H  . insulin glargine  10 Units Subcutaneous QHS  . mouth rinse  15 mL Mouth Rinse 10 times per day  . metoprolol tartrate  25 mg Per Tube Q12H  .  piperacillin-tazobactam (ZOSYN)  IV  3.375 g Intravenous Q12H  . pravastatin  20 mg Per Tube q1800  . senna-docusate  2 tablet Per Tube BID  . sodium chloride flush  3 mL Intravenous Q12H  . warfarin  2.5 mg Oral q1800  . Warfarin - Pharmacist Dosing Inpatient   Does not apply q1800    Assessment: Pharmacy consulted to dose and monitor warfarin therapy in this 83 year old woman with atrial fibrillation. Warfarin was held since 1/29 and patient is now s/p permcath placement. Patient receiving amiodarone 200 mg Daily. Home warfarin dose is 5.28m daily.   2/2  INR   1.29   Warfarin 5 mg 2/3  INR   1.24      5 mg 2/4  INR   1.43      5 mg 2/5: INR    1.81     543m 2/6: INR    2.28     26m16m  2/7: INR    4.09     Held  2/8: INR    4.11     Held 2/9             2.45    2.5 mg 2/10           3.00   Hold     Goal of Therapy:  INR 2-3 Monitor platelets by anticoagulation protocol: Yes   Plan:  Will hold warfarin therapy today. Will follow up on INR with am labs.   Pharmacy will continue to monitor and adjust per consult.   Joan Erickson D 02/29/2016 9:32 AM

## 2016-02-29 NOTE — Progress Notes (Addendum)
Discussed with [facility] critical care, Dr. Tobie Poet. Agreeable with potential transfer.  Updated daughter and relayed information that patient's long term prognosis is poor, given new dialysis, and need for ventilatory support, and likelihood that there will not be other interventions that can be offered there. They felt that they would still like to pursue transfer.  Called [facility] bed assignment, pt was accepted and will be placed on wait list for transfer.   Updated daughter, agreed that we will still attempt to wean to extubation if tolerated.  Currently pt remains awake, alert, on vent. Does not appear to be in any distress. Placed on 5/5 trial, and has good tidal volumes of 350-300 with ET around 35-40.  Weaning trial was continued, End tidal with minimal change. ABG was performed at end of trial which appeared compensated.  Will extubate and monitor.   After 30 min of doing well, the patient developed tachypnea, her sats began to drop. In another 10 min, the patient was requiring NRB with sat of 85%, she developed reduced mental status and became decreasingly responsive, then became obtunded. Flash pulmonary edema was suspected. She was reintubated without difficulty, no edema of the airway was noted.   Marda Stalker, M.D.  02/29/2016 Additional critical care time 45 min.

## 2016-02-29 NOTE — Progress Notes (Signed)
RN and NT entered room to find patient having an increased work of breathing, rhonchi, and wheezing. Patient was on 2L [place] and recently extubated around 30 mins prior.  Patient wanted to sit up more. Patient moved to sit on side of bed with support of RN and NT. Patients heart rate increased into the 140's intermittently. Metoprolol 5mg  given. O2 began to drop. Patient was placed on a venti mask at 40%, 1 mg of Lorazepam was given. A non rebreather at 15L was placed on patient. O2 sats remained in 80%. Patient had a sudden onset of decreased mental status. Decision was made to reintubate. 2mg  Versed and Vecuronium 10 mg were given with intubation. Patient back on Ventilator. Family updated and back at beside.

## 2016-02-29 NOTE — Procedures (Signed)
Endotracheal Intubation: Patient required placement of an artificial airway secondary to resp failure.   Consent: Emergent.   Hand washing performed prior to starting the procedure.   Medications administered for sedation prior to procedure: Midazolam 2 mg IV,  Vecuronium 10 mg IV.  Procedure: A time out procedure was called and correct patient, name, & ID confirmed. Needed supplies and equipment were assembled and checked to include ETT, 10 ml syringe, Glidescope, Mac and Miller blades, suction, oxygen and bag mask valve, end tidal CO2 monitor. Patient was positioned to align the mouth and pharynx to facilitate visualization of the glottis.  Heart rate, SpO2 and blood pressure was continuously monitored during the procedure. Pre-oxygenation was conducted prior to intubation and endotracheal tube was placed through the vocal cords into the trachea.  No airway edema was noted.    The artificial airway was placed under direct visualization via glidescope route using a 7.5 ETT on the first attempt.    ETT was secured at 23 cm mark.    Placement was confirmed by auscuitation of lungs with good breath sounds bilaterally and no stomach sounds.  Condensation was noted on endotracheal tube.  Pulse ox %.  CO2 detector in place with appropriate color change.   Complications: None .   Operator: Kasa.   Chest radiograph ordered and pending.     Marda Stalker, M.D.

## 2016-02-29 NOTE — Progress Notes (Signed)
[facility] Progess Note    ASSESSMENT/PLAN   Summary:  59 F with history of CAF, asthma, CKD admitted 02/14/2016 with SOB and atrial flutter. Admitted with renal failure, necessitating dialysis. Course has been, complicated by atrial fibrillation with RVR, and volume overload with pulmonary edema and volume overload requiring intubation.  PULMONARY A:Acute hypoxic respiratory failure/respiratory distress respiratory parameters are improved since doing dialysis- pt failed daily weaning trial  due to tachypnea, tachycardia, inadequate tidal volumes.  -Apical Pneumothorax s/p left chest tube 2/9 with resolution. Chest tube in place.  -Intubated 02/25/16 -Vent settings PRVC/18/450/5/30% --CT chest reviewed, moderate right pleural effusion, small left.  P:   Full vent support will attempt SBT again 02/10 after HD.  Continue chest tube to suction.  --Discussed case with husband and daughter and reviewed CT chest with them. Explained that patient may require thoracentesis if weaning trial is unsuccessful. They will consider this.   CARDIOVASCULAR A: Acute on chronicA. fib with RVR with bradycardia- sick sinus syndrome P:  -Cardizem 240 mg per tube, metoprolol PO scheduled and IV PRN, amiodarone per tube  -Continue coumadin for atrial fibrillation.  RENAL A:  End-stage renal disease, history of right nephrectomy--Xanthogranulomatous pyelonephritis. P:   -Trend BMP's -Replace electrolytes as indicated -Nephrology consulted appreciate input  -Per Nephrology pt to receive dialysis on scheduled days T-TH-Sat  GASTROINTESTINAL A: No acute issues Hx: GERD  P: Continue tube feedings  Pepcid for PUD prophylaxis   HEMATOLOGIC A: Anemia-chronic. Hemoglobin 9.4 today, INR 2.75 P: Trend CBC Monitor for s/sx of bleeding  Coumadin dosing per pharmacy SCD's for VTE prophylaxis   INFECTIOUS A: Leukocytosis Hypogammaglobulinemia  P:   Trend WBC and monitor fever  curve Trend PCT's Follow cultures Start empiric abx due to slow progress on weaning.    Micro/culture results: BC x2 01/23>>facklamia hominis  BCx2 01/30 x2>>negative   Antibiotics: Zosyn 2/9>>  ENDOCRINE A: Hyperglycemia  P: CBG's q4hrs SSI   NEUROLOGIC A: No acute issues P: Maintain RASS goal 0 to -1 Propofol gtt and prn fentanyl to maintain RASS goal WUA daily   MAJOR EVENTS/TEST RESULTS:   Best Practices  DVT Prophylaxis: Coumadin GI Prophylaxis: famotidine.    ---------------------------------------   ----------------------------------------   Name: Joan Erickson MRN: [id number] DOB: 07/10/1931    ADMISSION DATE:  02/16/2016    SUBJECTIVE:  Attempted SBT today, however pt became tachypneic with use of accessory muscles, therefore placed pt back on full vent support.  VITAL SIGNS: Temp:  [98.4 F (36.9 C)-98.8 F (37.1 C)] 98.4 F (36.9 C) (02/10 0800) Pulse Rate:  [81-126] 101 (02/10 0800) Resp:  [0-26] 20 (02/10 0800) BP: (98-143)/(50-90) 108/65 (02/10 0800) SpO2:  [95 %-100 %] 99 % (02/10 0800) FiO2 (%):  [28 %-30 %] 28 % (02/10 0800) Weight:  [132 lb 15 oz (60.3 kg)] 132 lb 15 oz (60.3 kg) (02/10 0301) HEMODYNAMICS:   VENTILATOR SETTINGS: Vent Mode: PRVC FiO2 (%):  [28 %-30 %] 28 % Set Rate:  [10 bmp] 10 bmp Vt Set:  [450 mL] 450 mL PEEP:  [0 cmH20-5 cmH20] 5 cmH20 Pressure Support:  [10 cmH20] 10 cmH20 Plateau Pressure:  [20 cmH20] 20 cmH20 INTAKE / OUTPUT:  Intake/Output Summary (Last 24 hours) at 02/29/16 0814 Last data filed at 02/29/16 0800  Gross per 24 hour  Intake             1348 ml  Output  30 ml  Net             1318 ml    PHYSICAL EXAMINATION: Physical Examination:   VS: BP 108/65 (BP Location: Right Arm)   Pulse (!) 101   Temp 98.4 F (36.9 C) (Axillary)   Resp 20   Ht 5' (1.524 m)   Wt 132 lb 15 oz (60.3 kg)   SpO2 99%   BMI 25.96 kg/m   General Appearance: well developed, well nourished AA  female  Neuro: alert following commands, PERRLA  HEENT: supple, mild JVD Pulmonary: faint crackles bilateral bases, tachypneic with use of accessory muscles  Cardiovascular: irregular, irregular, no M/R/G Abdomen: Benign, Soft, non-tender, non-distended  Skin: warm, no rashes, no ecchymosis  Extremities: normal, no cyanosis, clubbing.   LABS:   LABORATORY PANEL:   CBC  Recent Labs Lab 02/29/16 0500  WBC 10.4  HGB 7.3*  HCT 22.8*  PLT 197    Chemistries   Recent Labs Lab 02/29/16 0500  NA 137  K 3.8  CL 99*  CO2 31  GLUCOSE 144*  BUN 58*  CREATININE 4.29*  CALCIUM 7.9*  MG 2.1  PHOS 3.1     Recent Labs Lab 02/28/16 0354 02/28/16 0734 02/28/16 1140 02/28/16 1541 02/28/16 1943 02/29/16 0728  GLUCAP 125* 123* 142* 155* 126* 114*    Recent Labs Lab 02/25/16 0630 02/25/16 1008 02/26/16 0430  PHART 7.01* 7.41 7.53*  PCO2ART >120* 43 38  PO2ART 80* 207* 89    Recent Labs Lab 02/27/16 0930  ALBUMIN 2.2*    Cardiac Enzymes No results for input(s): TROPONINI in the last 168 hours.  RADIOLOGY:  Dg Chest 1 View  Result Date: 02/28/2016 CLINICAL DATA:  Post chest tube EXAM: CHEST 1 VIEW COMPARISON:  02/28/2016 FINDINGS: Interval placement of small bore chest tube on the left. Decreasing left apical pneumothorax, now small, less than 5%. Cardiomegaly with vascular congestion. Bilateral lower lobe opacities and layering effusions, increased since prior study. Remainder of the support devices are stable. IMPRESSION: Interval placement of left chest tube with near complete resolution of the left pneumothorax. Increasing bibasilar opacities and layering effusions. Electronically Signed   By: Rolm Baptise M.D.   On: 02/28/2016 12:02   Dg Chest 1 View  Result Date: 02/28/2016 CLINICAL DATA:  Anticipating ETT removal. EXAM: CHEST 1 VIEW COMPARISON:  February 27, 2016 FINDINGS: The ETT is in good position. Other support apparatus is stable. A left apical  pneumothorax measures 3 cm today versus 1.8 cm yesterday, a little larger in the interval. No evidence of tension. No right-sided pneumothorax. Small effusions remain, particularly on the left with underlying atelectasis. The cardiomediastinal silhouette is stable. No overt edema. IMPRESSION: 1. The left apical pneumothorax is a little larger in the interval measuring 3 cm at the apex today versus 1.8 cm yesterday. 2. Support apparatus is stable. 3. Continued cardiomegaly. 4. Small effusions, particularly on the left with underlying atelectasis. These results will be called to the ordering clinician or representative by the Radiologist Assistant, and communication documented in the PACS or zVision Dashboard. Electronically Signed   By: Dorise Bullion III M.D   On: 02/28/2016 08:29   Ct Chest Wo Contrast  Result Date: 02/28/2016 CLINICAL DATA:  Dyspnea atrial fibrillation EXAM: CT CHEST WITHOUT CONTRAST TECHNIQUE: Multidetector CT imaging of the chest was performed following the standard protocol without IV contrast. COMPARISON:  Chest x-ray 02/28/2016, CT chest 11/21/2014 FINDINGS: Cardiovascular: Limited evaluation without contrast. Atherosclerotic calcifications of the aorta.  No aneurysm. Coronary artery calcifications. Cardiomegaly. No large pericardial effusion. Left-sided central venous catheter tip terminates in the SVC. Right-sided central venous catheter tip terminates in the proximal right atrium. Mediastinum/Nodes: Endotracheal tube is present, the tip is approximately 2.8 cm superior to the carina. Esophageal tube is within the stomach but the tip is non imaged. Enlarged thyroid with multiple masses. 3.1 cm heterogenous mass lower pole left thyroid, a mass was present on the prior CT. Limited evaluation for hilar adenopathy. Few prominent mediastinal lymph nodes. Lungs/Pleura: Small moderate bilateral pleural effusions. Tiny left apical pneumothorax. Left-sided anterior chest tube with tip positioned  along the anterior, medial pleural surface. Stable nodularity in the apical portion of right upper lobe. Scattered foci of ground-glass density and centrilobular opacification within the right middle lobe, lingula, and right upper lobes suggesting respiratory infection/bronchiolitis. Upper Abdomen: Small amount of ascites in the upper abdomen. Surgical clips in the right retroperitoneum. Musculoskeletal: Small amount of subcutaneous emphysema in the left anterior chest wall. Degenerative changes. No acute or suspicious bone lesions. IMPRESSION: 1. Small moderate right greater than left pleural effusions. Scattered foci of ground-glass density and centrilobular density suggestive of respiratory infection/bronchiolitis. 2. Left-sided chest tube is in place. Tiny left apical pneumothorax. 3. Enlarged thyroid gland with multiple masses. This was noted on prior CT. Electronically Signed   By: Donavan Foil M.D.   On: 02/28/2016 22:07   Dg Chest Port 1 View  Result Date: 02/29/2016 CLINICAL DATA:  Acute respiratory failure EXAM: PORTABLE CHEST 1 VIEW COMPARISON:  Chest radiograph from one day prior. FINDINGS: Endotracheal tube tip is 3.1 cm above the carina. Enteric tube enters stomach with the tip not seen on this image. Left subclavian central venous catheter terminates in the lower third of the superior vena cava. Right internal jugular central venous catheter terminates at the cavoatrial junction. Left chest tube terminates over the left lower pleural space. Stable cardiomediastinal silhouette with mild cardiomegaly and aortic atherosclerosis. No pneumothorax. Stable small bilateral pleural effusions. No overt pulmonary edema. Stable mild bibasilar lung opacities. IMPRESSION: 1. Support structures as described.  No pneumothorax . 2. Stable cardiomegaly without overt pulmonary edema. 3. Stable small bilateral pleural effusions and mild bibasilar lung opacities, favor atelectasis. Electronically Signed   By: Ilona Sorrel M.D.   On: 02/29/2016 07:02    Update-Pts daughter and husband at bedside updated about plan of care and questions answered 02/29/16; call placed to [facility] for potential transfer as requested by family, waiting for call back.  Marda Stalker, M.D [place] Pager [phone] (please enter 7 digits)  [place].  I have personally obtained a history, examined the patient, evaluated laboratory and imaging results, formulated the assessment and plan and placed orders. The Patient requires high complexity decision making for assessment and support, frequent evaluation and titration of therapies, application of advanced monitoring technologies and extensive interpretation of multiple databases. The patient has critical illness that could lead imminently to failure of 1 or more organ systems and requires the highest level of physician preparedness to intervene.  Critical Care Time devoted to patient care services described in this note is 45 minutes and is exclusive of time spent in procedures.

## 2016-02-29 NOTE — Progress Notes (Signed)
Patient began to deteriorate shortly after extubation.  Patient placed on 40% VM with minimal improvement, sats remained in the low 80's.  Patient placed on 100% NRB with minimal improvement, sats in mid 80's.  Dr. Juanell Fairly to room, patient to be reintubated at this time.  Patient intubated with 7.5 at 23 at lip without complication

## 2016-03-01 ENCOUNTER — Inpatient Hospital Stay: Payer: Medicare Other

## 2016-03-01 ENCOUNTER — Other Ambulatory Visit: Payer: Self-pay | Admitting: Internal Medicine

## 2016-03-01 LAB — BASIC METABOLIC PANEL
ANION GAP: 7 (ref 5–15)
BUN: 42 mg/dL — ABNORMAL HIGH (ref 6–20)
CALCIUM: 8.2 mg/dL — AB (ref 8.9–10.3)
CO2: 33 mmol/L — AB (ref 22–32)
CREATININE: 3.45 mg/dL — AB (ref 0.44–1.00)
Chloride: 101 mmol/L (ref 101–111)
GFR calc Af Amer: 13 mL/min — ABNORMAL LOW (ref >60)
GFR calc non Af Amer: 11 mL/min — ABNORMAL LOW (ref >60)
GLUCOSE: 115 mg/dL — AB (ref 65–99)
Potassium: 3.9 mmol/L (ref 3.5–5.1)
Sodium: 141 mmol/L (ref 135–145)

## 2016-03-01 LAB — GLUCOSE, CAPILLARY
GLUCOSE-CAPILLARY: 109 mg/dL — AB (ref 65–99)
GLUCOSE-CAPILLARY: 151 mg/dL — AB (ref 65–99)
GLUCOSE-CAPILLARY: 133 mg/dL — AB (ref 65–99)
Glucose-Capillary: 111 mg/dL — ABNORMAL HIGH (ref 65–99)
Glucose-Capillary: 123 mg/dL — ABNORMAL HIGH (ref 65–99)
Glucose-Capillary: 138 mg/dL — ABNORMAL HIGH (ref 65–99)

## 2016-03-01 LAB — CBC
HEMATOCRIT: 22 % — AB (ref 35.0–47.0)
Hemoglobin: 7.4 g/dL — ABNORMAL LOW (ref 12.0–16.0)
MCH: 28.5 pg (ref 26.0–34.0)
MCHC: 33.6 g/dL (ref 32.0–36.0)
MCV: 84.9 fL (ref 80.0–100.0)
Platelets: 201 10*3/uL (ref 150–440)
RBC: 2.59 MIL/uL — ABNORMAL LOW (ref 3.80–5.20)
RDW: 17.6 % — AB (ref 11.5–14.5)
WBC: 10.3 10*3/uL (ref 3.6–11.0)

## 2016-03-01 LAB — PROTIME-INR
INR: 2.79
Prothrombin Time: 30 seconds — ABNORMAL HIGH (ref 11.4–15.2)

## 2016-03-01 LAB — PROCALCITONIN: Procalcitonin: 6.29 ng/mL

## 2016-03-01 NOTE — Progress Notes (Signed)
[facility] Progess Note    ASSESSMENT/PLAN   Summary:  62 F with history of CAF, asthma, CKD admitted 01/23/2016 with SOB and atrial flutter. Admitted with renal failure, necessitating dialysis. Course has been complicated by atrial fibrillation with RVR, and volume overload with pulmonary edema and volume overload requiring intubation. Pt extubated 2/10; reintubated within 1 hour.   PULMONARY A:-Intubated 02/25/16; Extubated 2/10, reintubated within 1 hour, likely flash pulm edema. -Continued right-sided moderate pleural effusion,--Apical Pneumothorax s/p left chest tube 2/9 with resolution. CXR 2/11 image reviewed, no residual PTX noted. Chest tube in place, no air drainage noted.  -Vent settings PRVC/14/450/5/45% --Pseudomonas in sputum, suspect Ventilator associated; continue abx. CT chest did not show evidence of pneumonia, possible tracheitis?   P:   --Full vent support. No further weaning at this time, Will likely need thora before can be weaned again.  --Continue chest tube to suction, can DC once extubated.  -- The patient will likely need to have right pleural effusion drained for her best chance of successful extubation. Currently she is on Coumadin, this will be discontinued and she can have a drainage procedure  performed after transfer to [facility]. If transfer is delayed then we can consider performing the procedure here via interventional radiology.     CARDIOVASCULAR A: Acute on chronicA. fib with RVR with bradycardia- sick sinus syndrome P:  -Cardizem 240 mg per tube, metoprolol PO scheduled and IV PRN, amiodarone per tube  -Continue coumadin for atrial fibrillation-- discontinue.   RENAL A:  End-stage renal disease, history of right nephrectomy--Xanthogranulomatous pyelonephritis. P:   -Trend BMP's -Replace electrolytes as indicated -D/w nephrology.  -Continue HD T-TH-Sat  GASTROINTESTINAL A: No acute issues Hx: GERD  P: Continue tube  feedings  Pepcid for PUD prophylaxis   HEMATOLOGIC A: Anemia-chronic. Hemoglobin 9.4 today, INR 2.75 P: Trend CBC Monitor for s/sx of bleeding  Coumadin-- discontinue.    INFECTIOUS A: Leukocytosis Hypogammaglobulinemia  Pseudomonas in sputum from 2/9 corresponding to increase in procalcitonin.  ?Ventilator associated tracheitis.  P:   Trend WBC and monitor fever curve Trend PCT's Continue Zosyn, await pseudomonas sensitivities.    Micro/culture results: BC x2 01/23>>facklamia hominis  BCx2 01/30 x2>>negative  Resp Cx 2/09>> abundant pseudomonas.   Antibiotics: Zosyn 2/9>>  ENDOCRINE A: Hyperglycemia  P: CBG's q4hrs SSI   NEUROLOGIC A: No acute issues P: Maintain RASS goal 0 to -1 Propofol gtt and prn fentanyl to maintain RASS goal WUA daily   MAJOR EVENTS/TEST RESULTS:   Best Practices  DVT Prophylaxis: Coumadin-- held.  GI Prophylaxis: famotidine.    ---------------------------------------   ----------------------------------------   Name: Joan Erickson MRN: [id number] DOB: 1931-11-17    ADMISSION DATE:  02/08/2016    SUBJECTIVE:  Pt awake, alert, appears comfotable on current vent settings.   VITAL SIGNS: Temp:  [97.7 F (36.5 C)-99 F (37.2 C)] 98.6 F (37 C) (02/11 0400) Pulse Rate:  [68-107] 95 (02/11 0900) Resp:  [0-25] 17 (02/11 0900) BP: (83-126)/(43-87) 113/58 (02/11 0900) SpO2:  [91 %-100 %] 99 % (02/11 0900) FiO2 (%):  [28 %-100 %] 45 % (02/11 0849) Weight:  [136 lb 14.5 oz (62.1 kg)-144 lb 11.2 oz (65.6 kg)] 136 lb 14.5 oz (62.1 kg) (02/11 0452) HEMODYNAMICS:   VENTILATOR SETTINGS: Vent Mode: PRVC FiO2 (%):  [28 %-100 %] 45 % Set Rate:  [14 bmp-18 bmp] 14 bmp Vt Set:  [450 mL] 450 mL PEEP:  [5 cmH20] 5 cmH20 Pressure Support:  [5 cmH20] 5  cmH20 Plateau Pressure:  [18 cmH20] 18 cmH20 INTAKE / OUTPUT:  Intake/Output Summary (Last 24 hours) at 03/01/16 0958 Last data filed at 03/01/16 0900  Gross per 24 hour  Intake            879.08 ml  Output              428 ml  Net           451.08 ml    PHYSICAL EXAMINATION: Physical Examination:   VS: BP (!) 113/58   Pulse 95   Temp 98.6 F (37 C) (Axillary)   Resp 17   Ht 5' (1.524 m)   Wt 136 lb 14.5 oz (62.1 kg)   SpO2 99%   BMI 26.74 kg/m   General Appearance: well developed, well nourished AA female  Neuro: alert following commands, PERRLA  HEENT: supple, mild JVD Pulmonary: faint crackles bilateral bases, tachypneic with use of accessory muscles  Cardiovascular: irregular, irregular, no M/R/G Abdomen: Benign, Soft, non-tender, non-distended  Skin: warm, no rashes, no ecchymosis  Extremities: normal, no cyanosis, clubbing.   LABS:   LABORATORY PANEL:   CBC  Recent Labs Lab 03/01/16 0600  WBC 10.3  HGB 7.4*  HCT 22.0*  PLT 201    Chemistries   Recent Labs Lab 02/29/16 0500 02/29/16 0918 03/01/16 0600  NA 137 137 141  K 3.8 3.9 3.9  CL 99* 98* 101  CO2 31 32 33*  GLUCOSE 144* 133* 115*  BUN 58* 62* 42*  CREATININE 4.29* 4.56* 3.45*  CALCIUM 7.9* 7.9* 8.2*  MG 2.1  --   --   PHOS 3.1 3.5  --      Recent Labs Lab 02/29/16 1205 02/29/16 1638 02/29/16 2023 02/29/16 2323 03/01/16 0343 03/01/16 0735  GLUCAP 115* 183* 113* 133* 111* 109*    Recent Labs Lab 02/25/16 1008 02/26/16 0430 02/29/16 1450  PHART 7.41 7.53* 7.47*  PCO2ART 43 38 51*  PO2ART 207* 89 81*    Recent Labs Lab 02/27/16 0930 02/29/16 0918  ALBUMIN 2.2* 2.0*    Cardiac Enzymes No results for input(s): TROPONINI in the last 168 hours.  RADIOLOGY:  Dg Chest 1 View  Result Date: 03/01/2016 CLINICAL DATA:  Shortness of Breath EXAM: CHEST 1 VIEW COMPARISON:  02/29/2016 FINDINGS: Support devices are stable. Right small bore chest tube remains in place. No pneumothorax. Cardiomegaly. Mild vascular congestion. Left lower lobe atelectasis or infiltrate with small left effusion, similar prior study. Small right effusion also noted, stable.  IMPRESSION: Left lower lobe atelectasis or infiltrate, stable. Small bilateral effusions. Cardiomegaly, vascular congestion. Electronically Signed   By: Rolm Baptise M.D.   On: 03/01/2016 07:24   Dg Chest 1 View  Result Date: 02/29/2016 CLINICAL DATA:  Hypoxia, shortness of breath, asthma EXAM: CHEST 1 VIEW COMPARISON:  02/29/2016 FINDINGS: Endotracheal tube terminates 1.5 cm above the carina. Lungs are essentially clear. No focal consolidation. Left chest drain. No pneumothorax is seen. Right IJ dual lumen catheter terminates in the upper right atrium. Left subclavian venous catheter terminates in the mid SVC. Mild cardiomegaly. IMPRESSION: Endotracheal tube terminates 1.5 cm above the carina. Left chest drain.  No pneumothorax is seen. Additional support apparatus as above. Electronically Signed   By: Julian Hy M.D.   On: 02/29/2016 16:46   Dg Chest 1 View  Result Date: 02/28/2016 CLINICAL DATA:  Post chest tube EXAM: CHEST 1 VIEW COMPARISON:  02/28/2016 FINDINGS: Interval placement of small bore chest tube on the left. Decreasing  left apical pneumothorax, now small, less than 5%. Cardiomegaly with vascular congestion. Bilateral lower lobe opacities and layering effusions, increased since prior study. Remainder of the support devices are stable. IMPRESSION: Interval placement of left chest tube with near complete resolution of the left pneumothorax. Increasing bibasilar opacities and layering effusions. Electronically Signed   By: Rolm Baptise M.D.   On: 02/28/2016 12:02   Dg Abd 1 View  Result Date: 02/29/2016 CLINICAL DATA:  Advancement of OG tube EXAM: ABDOMEN - 1 VIEW COMPARISON:  Film from earlier in the same day FINDINGS: The orogastric catheter is now seen in the distal stomach and possibly extending into the proximal small bowel. Postsurgical changes are seen. No obstructive changes are noted. IMPRESSION: Orogastric catheter is been advanced as described. Electronically Signed   By: Inez Catalina M.D.   On: 02/29/2016 19:45   Dg Abd 1 View  Result Date: 02/29/2016 CLINICAL DATA:  Evaluate OG tube EXAM: ABDOMEN - 1 VIEW COMPARISON:  None. FINDINGS: The side port of the OG tube is just below the GE junction with the tip in the gastric cardia. Left pleural effusion. No other interval changes. IMPRESSION: The side port of the OG tube is just below the GE junction with the tip in the gastric cardia. Recommend advancement. Electronically Signed   By: Dorise Bullion III M.D   On: 02/29/2016 18:43   Ct Chest Wo Contrast  Result Date: 02/28/2016 CLINICAL DATA:  Dyspnea atrial fibrillation EXAM: CT CHEST WITHOUT CONTRAST TECHNIQUE: Multidetector CT imaging of the chest was performed following the standard protocol without IV contrast. COMPARISON:  Chest x-ray 02/28/2016, CT chest 11/21/2014 FINDINGS: Cardiovascular: Limited evaluation without contrast. Atherosclerotic calcifications of the aorta. No aneurysm. Coronary artery calcifications. Cardiomegaly. No large pericardial effusion. Left-sided central venous catheter tip terminates in the SVC. Right-sided central venous catheter tip terminates in the proximal right atrium. Mediastinum/Nodes: Endotracheal tube is present, the tip is approximately 2.8 cm superior to the carina. Esophageal tube is within the stomach but the tip is non imaged. Enlarged thyroid with multiple masses. 3.1 cm heterogenous mass lower pole left thyroid, a mass was present on the prior CT. Limited evaluation for hilar adenopathy. Few prominent mediastinal lymph nodes. Lungs/Pleura: Small moderate bilateral pleural effusions. Tiny left apical pneumothorax. Left-sided anterior chest tube with tip positioned along the anterior, medial pleural surface. Stable nodularity in the apical portion of right upper lobe. Scattered foci of ground-glass density and centrilobular opacification within the right middle lobe, lingula, and right upper lobes suggesting respiratory  infection/bronchiolitis. Upper Abdomen: Small amount of ascites in the upper abdomen. Surgical clips in the right retroperitoneum. Musculoskeletal: Small amount of subcutaneous emphysema in the left anterior chest wall. Degenerative changes. No acute or suspicious bone lesions. IMPRESSION: 1. Small moderate right greater than left pleural effusions. Scattered foci of ground-glass density and centrilobular density suggestive of respiratory infection/bronchiolitis. 2. Left-sided chest tube is in place. Tiny left apical pneumothorax. 3. Enlarged thyroid gland with multiple masses. This was noted on prior CT. Electronically Signed   By: Donavan Foil M.D.   On: 02/28/2016 22:07   Dg Chest Port 1 View  Result Date: 02/29/2016 CLINICAL DATA:  Acute respiratory failure EXAM: PORTABLE CHEST 1 VIEW COMPARISON:  Chest radiograph from one day prior. FINDINGS: Endotracheal tube tip is 3.1 cm above the carina. Enteric tube enters stomach with the tip not seen on this image. Left subclavian central venous catheter terminates in the lower third of the superior vena cava. Right  internal jugular central venous catheter terminates at the cavoatrial junction. Left chest tube terminates over the left lower pleural space. Stable cardiomediastinal silhouette with mild cardiomegaly and aortic atherosclerosis. No pneumothorax. Stable small bilateral pleural effusions. No overt pulmonary edema. Stable mild bibasilar lung opacities. IMPRESSION: 1. Support structures as described.  No pneumothorax . 2. Stable cardiomegaly without overt pulmonary edema. 3. Stable small bilateral pleural effusions and mild bibasilar lung opacities, favor atelectasis. Electronically Signed   By: Ilona Sorrel M.D.   On: 02/29/2016 07:02    Update-Pts daughter and husband at bedside updated about plan of care and questions answered 02/29/16; call placed to [facility] for potential transfer as requested by family, waiting for call back.  Marda Stalker,  M.D [place] Pager [phone] (please enter 7 digits)  [place].  I have personally obtained a history, examined the patient, evaluated laboratory and imaging results, formulated the assessment and plan and placed orders. The Patient requires high complexity decision making for assessment and support, frequent evaluation and titration of therapies, application of advanced monitoring technologies and extensive interpretation of multiple databases. The patient has critical illness that could lead imminently to failure of 1 or more organ systems and requires the highest level of physician preparedness to intervene.  Critical Care Time devoted to patient care services described in this note is 45 minutes and is exclusive of time spent in procedures.

## 2016-03-01 NOTE — Progress Notes (Signed)
[facility]  ROUNDING NOTE   Subjective:   Daughter bedside.  Extubated yesterday but reintubated overnight.   Hemodialysis yesterday. UF of 374m. Some hypotension  Objective:  Vital signs in last 24 hours:  Temp:  [97.7 F (36.5 C)-99 F (37.2 C)] 98.6 F (37 C) (02/11 0400) Pulse Rate:  [68-112] 112 (02/11 1046) Resp:  [0-21] 15 (02/11 1000) BP: (83-126)/(43-87) 112/68 (02/11 1000) SpO2:  [91 %-100 %] 100 % (02/11 1011) FiO2 (%):  [28 %-100 %] 40 % (02/11 1011) Weight:  [62.1 kg (136 lb 14.5 oz)-65.6 kg (144 lb 11.2 oz)] 62.1 kg (136 lb 14.5 oz) (02/11 0452)  Weight change: 6 kg (13 lb 3.6 oz) Filed Weights   02/29/16 0930 02/29/16 1700 03/01/16 0452  Weight: 66.3 kg (146 lb 2.6 oz) 65.6 kg (144 lb 11.2 oz) 62.1 kg (136 lb 14.5 oz)    Intake/Output: I/O last 3 completed shifts: In: 1592.1 [I.V.:123; NG/GT:1319.1; IV Piggyback:150] Out: 428 [Urine:90; Other:338]   Intake/Output this shift:  Total I/O In: 90 [I.V.:30; NG/GT:60] Out: -   Physical Exam: General: Critically ill  Head: ETT, OGT  Eyes: Anicteric, PERRL  Neck: Supple, trachea midline  Lungs:  Clear, mechanical ventilation PRVC FiO2 50%, left sided chest tube  Heart: irregular  Abdomen:  Soft, nontender,   Extremities: 1+ peripheral and dependent edema.  Neurologic: Intubated, sedated  Skin: No lesions  Access: RIJ permcath    Basic Metabolic Panel:  Recent Labs Lab 02/25/16 1743 02/26/16 0455 02/26/16 1821 02/27/16 0536 02/27/16 0930 02/28/16 0440 02/29/16 0500 02/29/16 0918 03/01/16 0600  NA  --  134*  --  134* 137 137 137 137 141  K  --  3.7 3.5 3.4* 3.4* 3.9 3.8 3.9 3.9  CL  --  98*  --  100* 98* 98* 99* 98* 101  CO2  --  29  --  28 32 33* 31 32 33*  GLUCOSE  --  140*  --  171* 121* 137* 144* 133* 115*  BUN  --  53*  --  43* 21* 33* 58* 62* 42*  CREATININE  --  4.80*  --  3.73* 1.99* 3.03* 4.29* 4.56* 3.45*  CALCIUM  --  7.8*  --  7.4* 7.7* 7.8* 7.9* 7.9* 8.2*  MG  2.0 2.2 1.9 1.9  --   --  2.1  --   --   PHOS 3.5 2.2* 1.3* 1.9* 1.5* 3.1 3.1 3.5  --     Liver Function Tests:  Recent Labs Lab 02/27/16 0930 02/29/16 0918  ALBUMIN 2.2* 2.0*   No results for input(s): LIPASE, AMYLASE in the last 168 hours. No results for input(s): AMMONIA in the last 168 hours.  CBC:  Recent Labs Lab 02/26/16 0455 02/27/16 0536 02/28/16 0440 02/29/16 0500 03/01/16 0600  WBC 9.0 10.5 16.6* 10.4 10.3  NEUTROABS  --   --   --  9.0*  --   HGB 6.5* 8.0* 9.4* 7.3* 7.4*  HCT 19.8* 24.1* 29.3* 22.8* 22.0*  MCV 82.9 86.3 87.4 86.8 84.9  PLT 231 209 271 197 201    Cardiac Enzymes: No results for input(s): CKTOTAL, CKMB, CKMBINDEX, TROPONINI in the last 168 hours.  BNP: Invalid input(s): POCBNP  CBG:  Recent Labs Lab 02/29/16 1638 02/29/16 2023 02/29/16 2323 03/01/16 0343 03/01/16 0735  GLUCAP 183* 113* 133* 111* 109*    Microbiology: Results for orders placed or performed during the hospital encounter of 01/20/2016  Blood culture (routine x 2)  Status: Abnormal   Collection Time: 01/22/2016 12:31 PM  Result Value Ref Range Status   Specimen Description BLOOD RIGHT AC  Final   Special Requests   Final    BOTTLES DRAWN AEROBIC AND ANAEROBIC AER 11ML ANA 8ML   Culture  Setup Time   Final    GRAM POSITIVE COCCI AEROBIC BOTTLE ONLY CRITICAL RESULT CALLED TO, READ BACK BY AND VERIFIED WITH:  [place] AT 9628 02/16/2016 SDR    Culture (A)  Final    FACKLAMIA HOMINIS Standardized susceptibility testing for this organism is not available. Performed at [facility], [place] [address] [place], [place] [address]    Report Status 02/17/2016 FINAL  Final  Blood Culture ID Panel (Reflexed)     Status: None   Collection Time: 01/20/2016 12:31 PM  Result Value Ref Range Status   Enterococcus species NOT DETECTED NOT DETECTED Final   Listeria monocytogenes NOT DETECTED NOT DETECTED Final   Staphylococcus species NOT DETECTED NOT DETECTED Final    Staphylococcus aureus NOT DETECTED NOT DETECTED Final   Streptococcus species NOT DETECTED NOT DETECTED Final   Streptococcus agalactiae NOT DETECTED NOT DETECTED Final   Streptococcus pneumoniae NOT DETECTED NOT DETECTED Final   Streptococcus pyogenes NOT DETECTED NOT DETECTED Final   Acinetobacter baumannii NOT DETECTED NOT DETECTED Final   Enterobacteriaceae species NOT DETECTED NOT DETECTED Final   Enterobacter cloacae complex NOT DETECTED NOT DETECTED Final   Escherichia coli NOT DETECTED NOT DETECTED Final   Klebsiella oxytoca NOT DETECTED NOT DETECTED Final   Klebsiella pneumoniae NOT DETECTED NOT DETECTED Final   Proteus species NOT DETECTED NOT DETECTED Final   Serratia marcescens NOT DETECTED NOT DETECTED Final   Haemophilus influenzae NOT DETECTED NOT DETECTED Final   Neisseria meningitidis NOT DETECTED NOT DETECTED Final   Pseudomonas aeruginosa NOT DETECTED NOT DETECTED Final   Candida albicans NOT DETECTED NOT DETECTED Final   Candida glabrata NOT DETECTED NOT DETECTED Final   Candida krusei NOT DETECTED NOT DETECTED Final   Candida parapsilosis NOT DETECTED NOT DETECTED Final   Candida tropicalis NOT DETECTED NOT DETECTED Final  Blood culture (routine x 2)     Status: None   Collection Time: 02/05/2016 12:32 PM  Result Value Ref Range Status   Specimen Description BLOOD LEFT HAND  Final   Special Requests   Final    BOTTLES DRAWN AEROBIC AND ANAEROBIC AER 13ML ANA 13ML   Culture NO GROWTH 5 DAYS  Final   Report Status 02/16/2016 FINAL  Final  MRSA PCR Screening     Status: None   Collection Time: 02/01/2016 10:32 PM  Result Value Ref Range Status   MRSA by PCR NEGATIVE NEGATIVE Final    Comment:        The GeneXpert MRSA Assay (FDA approved for NASAL specimens only), is one component of a comprehensive MRSA colonization surveillance program. It is not intended to diagnose MRSA infection nor to guide or monitor treatment for MRSA infections.   Culture,  expectorated sputum-assessment     Status: None   Collection Time: 02/03/2016  3:24 PM  Result Value Ref Range Status   Specimen Description SPUTUM  Final   Special Requests Normal  Final   Sputum evaluation   Final    Sputum specimen not acceptable for testing.  Please recollect.   RESULT CALLED TO, READ BACK BY AND VERIFIED WITH: Champ Mungo 01/28/2016 1608 KLW    Report Status 02/06/2016 FINAL  Final  Culture, blood (  single) w Reflex to ID Panel     Status: None   Collection Time: 02/17/16  2:45 PM  Result Value Ref Range Status   Specimen Description BLOOD RIGHT ANTECUBITAL  Final   Special Requests   Final    BOTTLES DRAWN AEROBIC AND ANAEROBIC ANA 8CC AER [place]   Culture NO GROWTH 5 DAYS  Final   Report Status 02/22/2016 FINAL  Final  Culture, blood (Routine X 2) w Reflex to ID Panel     Status: None   Collection Time: 02/18/16  6:12 AM  Result Value Ref Range Status   Specimen Description BLOOD R HAND  Final   Special Requests BOTTLES DRAWN AEROBIC AND ANAEROBIC 1ML  Final   Culture NO GROWTH 5 DAYS  Final   Report Status 02/23/2016 FINAL  Final  Culture, blood (Routine X 2) w Reflex to ID Panel     Status: None   Collection Time: 02/18/16  6:12 AM  Result Value Ref Range Status   Specimen Description BLOOD LEFT HAND  Final   Special Requests BOTTLES DRAWN AEROBIC AND ANAEROBIC 4ML  Final   Culture NO GROWTH 5 DAYS  Final   Report Status 02/23/2016 FINAL  Final  Culture, respiratory (NON-Expectorated)     Status: None (Preliminary result)   Collection Time: 02/28/16  6:00 PM  Result Value Ref Range Status   Specimen Description TRACHEAL ASPIRATE  Final   Special Requests NONE  Final   Gram Stain   Final    ABUNDANT WBC PRESENT,BOTH PMN AND MONONUCLEAR ABUNDANT GRAM NEGATIVE RODS RARE BUDDING YEAST SEEN    Culture   Final    ABUNDANT PSEUDOMONAS AERUGINOSA REPEATING SENSITIVITIES Performed at [facility], [place] [address] [place], [place] [address]    Report  Status PENDING  Incomplete    Coagulation Studies:  Recent Labs  02/28/16 0440 02/29/16 0500 03/01/16 0600  LABPROT 27.0* 31.8* 30.0*  INR 2.45 3.00 2.79    Urinalysis: No results for input(s): COLORURINE, LABSPEC, PHURINE, GLUCOSEU, HGBUR, BILIRUBINUR, KETONESUR, PROTEINUR, UROBILINOGEN, NITRITE, LEUKOCYTESUR in the last 72 hours.  Invalid input(s): APPERANCEUR    Imaging: Dg Chest 1 View  Result Date: 03/01/2016 CLINICAL DATA:  Shortness of Breath EXAM: CHEST 1 VIEW COMPARISON:  02/29/2016 FINDINGS: Support devices are stable. Right small bore chest tube remains in place. No pneumothorax. Cardiomegaly. Mild vascular congestion. Left lower lobe atelectasis or infiltrate with small left effusion, similar prior study. Small right effusion also noted, stable. IMPRESSION: Left lower lobe atelectasis or infiltrate, stable. Small bilateral effusions. Cardiomegaly, vascular congestion. Electronically Signed   By: Rolm Baptise M.D.   On: 03/01/2016 07:24   Dg Chest 1 View  Result Date: 02/29/2016 CLINICAL DATA:  Hypoxia, shortness of breath, asthma EXAM: CHEST 1 VIEW COMPARISON:  02/29/2016 FINDINGS: Endotracheal tube terminates 1.5 cm above the carina. Lungs are essentially clear. No focal consolidation. Left chest drain. No pneumothorax is seen. Right IJ dual lumen catheter terminates in the upper right atrium. Left subclavian venous catheter terminates in the mid SVC. Mild cardiomegaly. IMPRESSION: Endotracheal tube terminates 1.5 cm above the carina. Left chest drain.  No pneumothorax is seen. Additional support apparatus as above. Electronically Signed   By: Julian Hy M.D.   On: 02/29/2016 16:46   Dg Chest 1 View  Result Date: 02/28/2016 CLINICAL DATA:  Post chest tube EXAM: CHEST 1 VIEW COMPARISON:  02/28/2016 FINDINGS: Interval placement of small bore chest tube on the left. Decreasing left apical pneumothorax, now  small, less than 5%. Cardiomegaly with vascular congestion.  Bilateral lower lobe opacities and layering effusions, increased since prior study. Remainder of the support devices are stable. IMPRESSION: Interval placement of left chest tube with near complete resolution of the left pneumothorax. Increasing bibasilar opacities and layering effusions. Electronically Signed   By: Rolm Baptise M.D.   On: 02/28/2016 12:02   Dg Abd 1 View  Result Date: 02/29/2016 CLINICAL DATA:  Advancement of OG tube EXAM: ABDOMEN - 1 VIEW COMPARISON:  Film from earlier in the same day FINDINGS: The orogastric catheter is now seen in the distal stomach and possibly extending into the proximal small bowel. Postsurgical changes are seen. No obstructive changes are noted. IMPRESSION: Orogastric catheter is been advanced as described. Electronically Signed   By: Inez Catalina M.D.   On: 02/29/2016 19:45   Dg Abd 1 View  Result Date: 02/29/2016 CLINICAL DATA:  Evaluate OG tube EXAM: ABDOMEN - 1 VIEW COMPARISON:  None. FINDINGS: The side port of the OG tube is just below the GE junction with the tip in the gastric cardia. Left pleural effusion. No other interval changes. IMPRESSION: The side port of the OG tube is just below the GE junction with the tip in the gastric cardia. Recommend advancement. Electronically Signed   By: Dorise Bullion III M.D   On: 02/29/2016 18:43   Ct Chest Wo Contrast  Result Date: 02/28/2016 CLINICAL DATA:  Dyspnea atrial fibrillation EXAM: CT CHEST WITHOUT CONTRAST TECHNIQUE: Multidetector CT imaging of the chest was performed following the standard protocol without IV contrast. COMPARISON:  Chest x-ray 02/28/2016, CT chest 11/21/2014 FINDINGS: Cardiovascular: Limited evaluation without contrast. Atherosclerotic calcifications of the aorta. No aneurysm. Coronary artery calcifications. Cardiomegaly. No large pericardial effusion. Left-sided central venous catheter tip terminates in the SVC. Right-sided central venous catheter tip terminates in the proximal right  atrium. Mediastinum/Nodes: Endotracheal tube is present, the tip is approximately 2.8 cm superior to the carina. Esophageal tube is within the stomach but the tip is non imaged. Enlarged thyroid with multiple masses. 3.1 cm heterogenous mass lower pole left thyroid, a mass was present on the prior CT. Limited evaluation for hilar adenopathy. Few prominent mediastinal lymph nodes. Lungs/Pleura: Small moderate bilateral pleural effusions. Tiny left apical pneumothorax. Left-sided anterior chest tube with tip positioned along the anterior, medial pleural surface. Stable nodularity in the apical portion of right upper lobe. Scattered foci of ground-glass density and centrilobular opacification within the right middle lobe, lingula, and right upper lobes suggesting respiratory infection/bronchiolitis. Upper Abdomen: Small amount of ascites in the upper abdomen. Surgical clips in the right retroperitoneum. Musculoskeletal: Small amount of subcutaneous emphysema in the left anterior chest wall. Degenerative changes. No acute or suspicious bone lesions. IMPRESSION: 1. Small moderate right greater than left pleural effusions. Scattered foci of ground-glass density and centrilobular density suggestive of respiratory infection/bronchiolitis. 2. Left-sided chest tube is in place. Tiny left apical pneumothorax. 3. Enlarged thyroid gland with multiple masses. This was noted on prior CT. Electronically Signed   By: Donavan Foil M.D.   On: 02/28/2016 22:07   Dg Chest Port 1 View  Result Date: 02/29/2016 CLINICAL DATA:  Acute respiratory failure EXAM: PORTABLE CHEST 1 VIEW COMPARISON:  Chest radiograph from one day prior. FINDINGS: Endotracheal tube tip is 3.1 cm above the carina. Enteric tube enters stomach with the tip not seen on this image. Left subclavian central venous catheter terminates in the lower third of the superior vena cava. Right internal jugular central venous  catheter terminates at the cavoatrial junction.  Left chest tube terminates over the left lower pleural space. Stable cardiomediastinal silhouette with mild cardiomegaly and aortic atherosclerosis. No pneumothorax. Stable small bilateral pleural effusions. No overt pulmonary edema. Stable mild bibasilar lung opacities. IMPRESSION: 1. Support structures as described.  No pneumothorax . 2. Stable cardiomegaly without overt pulmonary edema. 3. Stable small bilateral pleural effusions and mild bibasilar lung opacities, favor atelectasis. Electronically Signed   By: Ilona Sorrel M.D.   On: 02/29/2016 07:02     Medications:   . sodium chloride    . norepinephrine (LEVOPHED) Adult infusion Stopped (02/25/16 1405)  . propofol (DIPRIVAN) infusion Stopped (02/28/16 0820)   . amiodarone  200 mg Per Tube Daily  . arformoterol  15 mcg Nebulization BID  . budesonide (PULMICORT) nebulizer solution  0.25 mg Nebulization BID  . chlorhexidine gluconate (MEDLINE KIT)  15 mL Mouth Rinse BID  . citalopram  10 mg Per Tube Daily  . diltiazem  120 mg Oral Q12H  . epoetin (EPOGEN/PROCRIT) injection  10,000 Units Intravenous Q T,Th,Sa-HD  . famotidine  20 mg Per Tube Daily  . feeding supplement (PRO-STAT SUGAR FREE 64)  30 mL Per Tube Daily  . feeding supplement (VITAL HIGH PROTEIN)  1,000 mL Per Tube Q24H  . insulin glargine  10 Units Subcutaneous QHS  . mouth rinse  15 mL Mouth Rinse 10 times per day  . metoprolol tartrate  25 mg Per Tube Q12H  . piperacillin-tazobactam (ZOSYN)  IV  3.375 g Intravenous Q12H  . pravastatin  20 mg Per Tube q1800  . senna-docusate  2 tablet Per Tube BID  . sodium chloride flush  3 mL Intravenous Q12H   sodium chloride, sodium chloride, albuterol, bisacodyl, fentaNYL (SUBLIMAZE) injection, fentaNYL (SUBLIMAZE) injection, guaiFENesin-dextromethorphan, lip balm, metoprolol, midazolam, midazolam, nitroGLYCERIN, [DISCONTINUED] ondansetron **OR** ondansetron (ZOFRAN) IV, senna-docusate  Assessment/ Plan:  Joan Erickson is a 83  y.o. 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 ESRD: TTS schedule - Monitor for dialysis need.  - Next treatment scheduled for Tuesday.  - Outpatient dialysis planning is [facility] to be followed by [facility].   2. Hypertension with atrial fibrillation: off vasopressors. Monitor blood pressure and heart rate.   3. Anemia of chronic kidney disease: status post PRBC transfusion 2/7. Hemoglobin 7.4  - epo with TTS treatments.   4. Secondary Hyperparathyroidism: phos and calcium at goal. Not currently on binders.   5. Hyponatremia: secondary to renal failure and volume. Improved with dialysis  6. Respiratory Failure: intubated and sedated. with pneumothorax and pleural effusions.  - appreciate pulm input. Chest tube placed on on 2/9.  - Weaning for today.    LOS: [place], [place] 2/11/201811:26 AM

## 2016-03-01 NOTE — Progress Notes (Signed)
RN spoke with Dr. Ashby Dawes and made MD aware that patient's blood pressure has dropped with MAP in mid 50's and heart rate has went as low as 38 but maintaining in 40-50's afibb and this has occurred since metoprolol, amiodarone and cardizem were given as scheduled this morning.  Patient continues to arouse to voice and follows commands writing on paper.  MD gave order to discontinue cardizem and metoprolol and to monitor blood pressure for now.

## 2016-03-01 NOTE — Progress Notes (Addendum)
ANTICOAGULATION CONSULT NOTE - Follow Up Consult  Pharmacy Consult for warfarin  Indication: atrial fibrillation   No Known Allergies  Patient Measurements: Height: 5' (152.4 cm) Weight: 136 lb 14.5 oz (62.1 kg) IBW/kg (Calculated) : 45.5  Vital Signs: Temp: 98.6 F (37 C) (02/11 0400) Temp Source: Axillary (02/11 0400) BP: 112/68 (02/11 1000) Pulse Rate: 101 (02/11 1000)  Labs:  Recent Labs  02/28/16 0440 02/29/16 0500 02/29/16 0918 03/01/16 0600  HGB 9.4* 7.3*  --  7.4*  HCT 29.3* 22.8*  --  22.0*  PLT 271 197  --  201  LABPROT 27.0* 31.8*  --  30.0*  INR 2.45 3.00  --  2.79  CREATININE 3.03* 4.29* 4.56* 3.45*     Estimated Creatinine Clearance: 10.3 mL/min (by C-G formula based on SCr of 3.45 mg/dL (H)).   Medical History: Past Medical History:  Diagnosis Date  . Asthma   . Atrial fibrillation ([place])   . GERD (gastroesophageal reflux disease)   . History of colon polyps   . Hypercholesterolemia   . Hypertension   . Hypogammaglobulinemia ([facility])    IgA  . Warfarin anticoagulation   . Xanthogranulomatous pyelonephritis    s/p right nephrectomy    Medications:  Prescriptions Prior to Admission  Medication Sig Dispense Refill Last Dose  . acetaminophen (TYLENOL) 500 MG tablet Take 500 mg by mouth every 6 (six) hours as needed.   prn at prn  . ADVAIR DISKUS 250-50 MCG/DOSE AEPB INHALE 1 PUFF BY MOUTH TWICE A DAY 60 each 5 02/10/2016 at 2100  . chlorthalidone (HYGROTON) 25 MG tablet Take 0.5 tablets (12.5 mg total) by mouth daily. 30 tablet 2 01/23/2016 at 0900  . Cholecalciferol (VITAMIN D) 2000 UNITS tablet Take 2,000 Units by mouth daily.   02/10/2016 at 0900  . citalopram (CELEXA) 10 MG tablet Take 1 tablet (10 mg total) by mouth daily. 90 tablet 1 02/18/2016 at 0900  . diltiazem (TIAZAC) 360 MG 24 hr capsule Take 1 capsule (360 mg total) by mouth daily. 90 capsule 1 01/24/2016 at 0900  . finasteride (PROSCAR) 5 MG tablet    01/25/2016 at 0900  . fluticasone  (FLONASE) 50 MCG/ACT nasal spray USE 2 SPRAYS IN EACH NOSTRIL ONCE A DAY 16 g 5 02/10/2016 at Unknown time  . montelukast (SINGULAIR) 10 MG tablet Take 1 tablet (10 mg total) by mouth daily. 90 tablet 1 02/17/2016 at 0900  . pravastatin (PRAVACHOL) 20 MG tablet TAKE 1 TABLET (20 MG TOTAL) BY MOUTH DAILY. 30 tablet 8 02/10/2016 at 2100  . PROAIR HFA 108 (90 Base) MCG/ACT inhaler INHALE 2 PUFF EVERY SIX HOURS AS NEEDED 8.5 Inhaler 2 02/10/2016 at 0900  . TRADJENTA 5 MG TABS tablet TAKE 1 TABLET (5 MG TOTAL) BY MOUTH DAILY. 30 tablet 11 02/05/2016 at 0900  . warfarin (COUMADIN) 1 MG tablet Take 1 mg by mouth at bedtime. Pt takes 0.5 t in addition to the 5 mg dose  4 02/10/2016 at 2100  . warfarin (COUMADIN) 5 MG tablet Take 5 mg by mouth at bedtime. Pt takes 5 mg dose along with 0.5 of a 1 mg tab qhs   02/10/2016 at 2100  . Blood Glucose Monitoring Suppl (ONE TOUCH ULTRA SYSTEM KIT) W/DEVICE KIT 1 kit by Does not apply route once. 1 each 0 Taking  . eplerenone (INSPRA) 25 MG tablet TAKE 1 TABLET (25 MG TOTAL) BY MOUTH ONCE DAILY FOR 14 DAYS.  0 Taking  . fluticasone (FLONASE) 50 MCG/ACT nasal spray  USE 2 SPRAYS IN EACH NOSTRIL ONCE A DAY 16 g 1 Taking  . ONE TOUCH ULTRA TEST test strip CHECK BLOOD SUGAR 2 TIMES A DAY DX: E11.9 100 each 3 Taking   Scheduled:  . amiodarone  200 mg Per Tube Daily  . arformoterol  15 mcg Nebulization BID  . budesonide (PULMICORT) nebulizer solution  0.25 mg Nebulization BID  . chlorhexidine gluconate (MEDLINE KIT)  15 mL Mouth Rinse BID  . citalopram  10 mg Per Tube Daily  . diltiazem  120 mg Oral Q12H  . epoetin (EPOGEN/PROCRIT) injection  10,000 Units Intravenous Q T,Th,Sa-HD  . famotidine  20 mg Per Tube Daily  . feeding supplement (PRO-STAT SUGAR FREE 64)  30 mL Per Tube Daily  . feeding supplement (VITAL HIGH PROTEIN)  1,000 mL Per Tube Q24H  . insulin glargine  10 Units Subcutaneous QHS  . mouth rinse  15 mL Mouth Rinse 10 times per day  . metoprolol tartrate  25 mg  Per Tube Q12H  . piperacillin-tazobactam (ZOSYN)  IV  3.375 g Intravenous Q12H  . pravastatin  20 mg Per Tube q1800  . senna-docusate  2 tablet Per Tube BID  . sodium chloride flush  3 mL Intravenous Q12H    Assessment: Pharmacy consulted to dose and monitor warfarin therapy in this 83 year old woman with atrial fibrillation. Warfarin was held since 1/29 and patient is now s/p permcath placement. Patient receiving amiodarone 200 mg Daily. Home warfarin dose is 5.70m daily.   2/2  INR   1.29   Warfarin 5 mg 2/3  INR   1.24      5 mg 2/4  INR   1.43      5 mg 2/5: INR    1.81     531m 2/6: INR    2.28     24m12m  2/7: INR    4.09     Held  2/8: INR    4.11     Held 2/9             2.45    2.5 mg 2/10           3.00   Hold   2/11            2.79; 3 mg      Goal of Therapy:  INR 2-3 Monitor platelets by anticoagulation protocol: Yes   Plan:  Will give warfarin 3 mg today. Will follow up on INR with am labs.   Pharmacy will continue to monitor and adjust per consult.   Demarco Bacci D 03/01/2016 10:44 AM   ADD:  coumadin was held for potential right thoracentesis upon transfer. Resp. 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 [facility]

## 2016-03-02 ENCOUNTER — Inpatient Hospital Stay: Payer: Medicare Other

## 2016-03-02 LAB — PREPARE RBC (CROSSMATCH)

## 2016-03-02 LAB — BASIC METABOLIC PANEL
ANION GAP: 7 (ref 5–15)
BUN: 59 mg/dL — ABNORMAL HIGH (ref 6–20)
CALCIUM: 8.1 mg/dL — AB (ref 8.9–10.3)
CO2: 32 mmol/L (ref 22–32)
CREATININE: 4.94 mg/dL — AB (ref 0.44–1.00)
Chloride: 100 mmol/L — ABNORMAL LOW (ref 101–111)
GFR, EST AFRICAN AMERICAN: 9 mL/min — AB (ref >60)
GFR, EST NON AFRICAN AMERICAN: 7 mL/min — AB (ref >60)
Glucose, Bld: 89 mg/dL (ref 65–99)
Potassium: 3.5 mmol/L (ref 3.5–5.1)
SODIUM: 139 mmol/L (ref 135–145)

## 2016-03-02 LAB — CULTURE, RESPIRATORY W GRAM STAIN

## 2016-03-02 LAB — BLOOD GAS, ARTERIAL
DELIVERY SYSTEMS: POSITIVE
EXPIRATORY PAP: 10
FIO2: 1
INSPIRATORY PAP: 16
PH ART: 7.03 — AB (ref 7.350–7.450)
Patient temperature: 37
pCO2 arterial: 120 mmHg (ref 32.0–48.0)
pO2, Arterial: 88 mmHg (ref 83.0–108.0)

## 2016-03-02 LAB — CULTURE, RESPIRATORY

## 2016-03-02 LAB — CBC
HCT: 22 % — ABNORMAL LOW (ref 35.0–47.0)
HEMOGLOBIN: 7 g/dL — AB (ref 12.0–16.0)
MCH: 27.3 pg (ref 26.0–34.0)
MCHC: 32 g/dL (ref 32.0–36.0)
MCV: 85.1 fL (ref 80.0–100.0)
PLATELETS: 219 10*3/uL (ref 150–440)
RBC: 2.58 MIL/uL — AB (ref 3.80–5.20)
RDW: 18 % — ABNORMAL HIGH (ref 11.5–14.5)
WBC: 8.2 10*3/uL (ref 3.6–11.0)

## 2016-03-02 LAB — GLUCOSE, CAPILLARY
GLUCOSE-CAPILLARY: 98 mg/dL (ref 65–99)
Glucose-Capillary: 82 mg/dL (ref 65–99)
Glucose-Capillary: 100 mg/dL — ABNORMAL HIGH (ref 65–99)
Glucose-Capillary: 106 mg/dL — ABNORMAL HIGH (ref 65–99)

## 2016-03-02 LAB — MAGNESIUM: MAGNESIUM: 2.2 mg/dL (ref 1.7–2.4)

## 2016-03-02 LAB — PROTIME-INR
INR: 3
PROTHROMBIN TIME: 31.8 s — AB (ref 11.4–15.2)

## 2016-03-02 MED ORDER — CEFTAZIDIME 2 G IJ SOLR
2.0000 g | Freq: Once | INTRAMUSCULAR | Status: DC
  Filled 2016-03-02: qty 2

## 2016-03-02 MED ORDER — AMIODARONE HCL 200 MG PO TABS: 400.0000 mg | ORAL_TABLET | Freq: Two times a day (BID) | ORAL | Status: DC

## 2016-03-02 MED ORDER — DEXTROSE 5 % IV SOLN: 2.0000 g | INTRAVENOUS | Status: DC

## 2016-03-02 MED ORDER — ALBUTEROL SULFATE (2.5 MG/3ML) 0.083% IN NEBU: 2.5000 mg | INHALATION_SOLUTION | RESPIRATORY_TRACT | Status: DC | PRN

## 2016-03-02 MED ORDER — WARFARIN - PHARMACIST DOSING INPATIENT
Freq: Every day | Status: DC
  Administered 2016-03-02: 18:00:00

## 2016-03-02 MED ORDER — LORAZEPAM 2 MG/ML IJ SOLN
0.5000 mg | INTRAMUSCULAR | Status: DC | PRN
  Administered 2016-03-02: 0.5 mg via INTRAVENOUS
  Filled 2016-03-02: qty 1

## 2016-03-02 MED ORDER — FENTANYL CITRATE (PF) 100 MCG/2ML IJ SOLN: 25.0000 ug | INTRAMUSCULAR | Status: DC | PRN

## 2016-03-02 MED ORDER — HYDRALAZINE HCL 20 MG/ML IJ SOLN
10.0000 mg | INTRAMUSCULAR | Status: DC | PRN
  Administered 2016-03-02: 20 mg via INTRAVENOUS
  Filled 2016-03-02: qty 2

## 2016-03-02 MED ORDER — FENTANYL CITRATE (PF) 100 MCG/2ML IJ SOLN
12.5000 ug | INTRAMUSCULAR | Status: DC | PRN
  Administered 2016-03-02: 12.5 ug via INTRAVENOUS
  Filled 2016-03-02: qty 2

## 2016-03-02 MED ORDER — CEFTAZIDIME AND DEXTROSE 2 GM/50ML IV SOLR
2.0000 g | Freq: Once | INTRAVENOUS | Status: AC
  Administered 2016-03-02: 2 g via INTRAVENOUS
  Filled 2016-03-02: qty 50

## 2016-03-02 MED ORDER — BUDESONIDE 0.25 MG/2ML IN SUSP
0.2500 mg | Freq: Once | RESPIRATORY_TRACT | Status: AC
  Administered 2016-03-02: 0.25 mg via RESPIRATORY_TRACT

## 2016-03-02 MED ORDER — AMIODARONE HCL 200 MG PO TABS
400.0000 mg | ORAL_TABLET | Freq: Two times a day (BID) | ORAL | Status: DC
Start: 2016-03-02 — End: 2016-03-02
  Administered 2016-03-02: 400 mg
  Filled 2016-03-02: qty 2

## 2016-03-02 MED ORDER — LORAZEPAM 2 MG/ML IJ SOLN
1.0000 mg | Freq: Once | INTRAMUSCULAR | Status: AC
  Administered 2016-03-02: 1 mg via INTRAVENOUS
  Filled 2016-03-02: qty 1

## 2016-03-02 MED ORDER — MORPHINE SULFATE (PF) 4 MG/ML IV SOLN
1.0000 mg | INTRAVENOUS | Status: DC | PRN
  Filled 2016-03-02: qty 1

## 2016-03-02 MED ORDER — DEXTROSE 5 % IV SOLN
2.0000 g | Freq: Once | INTRAVENOUS | Status: DC
  Filled 2016-03-02: qty 2

## 2016-03-02 MED ORDER — SODIUM CHLORIDE 0.9 % IV SOLN
Freq: Once | INTRAVENOUS | Status: AC
  Administered 2016-03-02: 16:00:00 via INTRAVENOUS

## 2016-03-02 MED ORDER — PRAVASTATIN SODIUM 40 MG PO TABS: 20.0000 mg | ORAL_TABLET | Freq: Every day | ORAL | Status: DC

## 2016-03-02 MED ORDER — METOPROLOL TARTRATE 5 MG/5ML IV SOLN
2.5000 mg | INTRAVENOUS | Status: DC | PRN
  Filled 2016-03-02: qty 5

## 2016-03-02 MED ORDER — METHYLPREDNISOLONE SODIUM SUCC 40 MG IJ SOLR
40.0000 mg | Freq: Once | INTRAMUSCULAR | Status: AC
  Administered 2016-03-02: 40 mg via INTRAVENOUS
  Filled 2016-03-02: qty 1

## 2016-03-02 MED ORDER — MORPHINE SULFATE (PF) 4 MG/ML IV SOLN
2.0000 mg | INTRAVENOUS | Status: DC | PRN
  Administered 2016-03-02 (×5): 2 mg via INTRAVENOUS
  Administered 2016-03-03 (×2): 4 mg via INTRAVENOUS
  Administered 2016-03-03: 2 mg via INTRAVENOUS
  Administered 2016-03-03: 4 mg via INTRAVENOUS
  Administered 2016-03-03: 2 mg via INTRAVENOUS
  Filled 2016-03-02 (×8): qty 1

## 2016-03-02 NOTE — Progress Notes (Signed)
ANTICOAGULATION CONSULT NOTE - Follow Up Consult  Pharmacy Consult for warfarin  Indication: atrial fibrillation   No Known Allergies  Patient Measurements: Height: 5' (152.4 cm) Weight: 138 lb 0.1 oz (62.6 kg) IBW/kg (Calculated) : 45.5  Vital Signs: Temp: 98.3 F (36.8 C) (02/12 1200) Temp Source: Oral (02/12 1200) BP: 128/80 (02/12 1500) Pulse Rate: 96 (02/12 1500)  Labs:  Recent Labs  02/29/16 0500 02/29/16 0918 03/01/16 0600 03/02/16 0537  HGB 7.3*  --  7.4* 7.0*  HCT 22.8*  --  22.0* 22.0*  PLT 197  --  201 219  LABPROT 31.8*  --  30.0* 31.8*  INR 3.00  --  2.79 3.00  CREATININE 4.29* 4.56* 3.45* 4.94*     Estimated Creatinine Clearance: 7.2 mL/min (by C-G formula based on SCr of 4.94 mg/dL (H)).   Medical History: Past Medical History:  Diagnosis Date  . Asthma   . Atrial fibrillation ([place])   . GERD (gastroesophageal reflux disease)   . History of colon polyps   . Hypercholesterolemia   . Hypertension   . Hypogammaglobulinemia ([facility])    IgA  . Warfarin anticoagulation   . Xanthogranulomatous pyelonephritis    s/p right nephrectomy    Medications:  Prescriptions Prior to Admission  Medication Sig Dispense Refill Last Dose  . acetaminophen (TYLENOL) 500 MG tablet Take 500 mg by mouth every 6 (six) hours as needed.   prn at prn  . ADVAIR DISKUS 250-50 MCG/DOSE AEPB INHALE 1 PUFF BY MOUTH TWICE A DAY 60 each 5 02/10/2016 at 2100  . Cholecalciferol (VITAMIN D) 2000 UNITS tablet Take 2,000 Units by mouth daily.   02/16/2016 at 0900  . citalopram (CELEXA) 10 MG tablet Take 1 tablet (10 mg total) by mouth daily. 90 tablet 1 02/10/2016 at 0900  . diltiazem (TIAZAC) 360 MG 24 hr capsule Take 1 capsule (360 mg total) by mouth daily. 90 capsule 1 02/07/2016 at 0900  . finasteride (PROSCAR) 5 MG tablet    01/27/2016 at 0900  . fluticasone (FLONASE) 50 MCG/ACT nasal spray USE 2 SPRAYS IN EACH NOSTRIL ONCE A DAY 16 g 5 02/10/2016 at Unknown time  . montelukast  (SINGULAIR) 10 MG tablet Take 1 tablet (10 mg total) by mouth daily. 90 tablet 1 02/06/2016 at 0900  . PROAIR HFA 108 (90 Base) MCG/ACT inhaler INHALE 2 PUFF EVERY SIX HOURS AS NEEDED 8.5 Inhaler 2 02/10/2016 at 0900  . TRADJENTA 5 MG TABS tablet TAKE 1 TABLET (5 MG TOTAL) BY MOUTH DAILY. 30 tablet 11 01/29/2016 at 0900  . warfarin (COUMADIN) 1 MG tablet Take 1 mg by mouth at bedtime. Pt takes 0.5 t in addition to the 5 mg dose  4 02/10/2016 at 2100  . warfarin (COUMADIN) 5 MG tablet Take 5 mg by mouth at bedtime. Pt takes 5 mg dose along with 0.5 of a 1 mg tab qhs   02/10/2016 at 2100  . Blood Glucose Monitoring Suppl (ONE TOUCH ULTRA SYSTEM KIT) W/DEVICE KIT 1 kit by Does not apply route once. 1 each 0 Taking  . eplerenone (INSPRA) 25 MG tablet TAKE 1 TABLET (25 MG TOTAL) BY MOUTH ONCE DAILY FOR 14 DAYS.  0 Taking  . fluticasone (FLONASE) 50 MCG/ACT nasal spray USE 2 SPRAYS IN EACH NOSTRIL ONCE A DAY 16 g 1 Taking  . ONE TOUCH ULTRA TEST test strip CHECK BLOOD SUGAR 2 TIMES A DAY DX: E11.9 100 each 3 Taking   Scheduled:  . sodium chloride   Intravenous  Once  . [START ON Mar 18, 2016] amiodarone  400 mg Oral BID  . arformoterol  15 mcg Nebulization BID  . budesonide (PULMICORT) nebulizer solution  0.25 mg Nebulization BID  . [START ON 03/04/2016] cefTAZidime (FORTAZ)  IV  2 g Intravenous Q48H  . chlorhexidine gluconate (MEDLINE KIT)  15 mL Mouth Rinse BID  . epoetin (EPOGEN/PROCRIT) injection  10,000 Units Intravenous Q T,Th,Sa-HD  . mouth rinse  15 mL Mouth Rinse 10 times per day  . [START ON 2016-03-18] pravastatin  20 mg Oral q1800  . senna-docusate  2 tablet Per Tube BID  . sodium chloride flush  3 mL Intravenous Q12H    Assessment: Pharmacy consulted to dose and monitor warfarin therapy in this 83 year old woman with atrial fibrillation. Warfarin was held since 1/29 and patient is now s/p permcath placement. Patient receiving amiodarone 200 mg Daily. Home warfarin dose is 5.66m daily.   2/2    INR   1.29    Warfarin 5 mg 2/3  INR   1.24      5 mg 2/4  INR   1.43       5 mg 2/5:  INR    1.81      548m 2/6:  INR    2.28      83m34m  2/7:  INR    4.09      Held  2/8:  INR    4.11      Held 2/9             2.45     2.5 mg 2/10           3.00    Held   2/11            2.79   Held 2/12  INR: 3.00       Goal of Therapy:  INR 2-3 Monitor platelets by anticoagulation protocol: Yes   Plan:  May resume warfarin as no plans for thoracentesis per Dr. SimAlva Garnetill hold warfarin tonight due to INR of 3 and f/u AM INR.   Pharmacy will continue to monitor and adjust per consult.   ChrNapoleon Form12/2018 3:16 PM

## 2016-03-02 NOTE — Progress Notes (Signed)
Pt. Was suctioned prior to extubation for a small amount of thick white secretions. Per Dr. Alva Garnet order, she was extubated to a 2 L nasal cannula. She developed stridor about 10 to 15 minutes after extubation. She was given a pulmicort neb treatment.At. 12:15 I placed her on bipap for the stridor. Breath sounds are improving and sa02 is 97%.

## 2016-03-02 NOTE — Progress Notes (Signed)
Dr. Alva Garnet present and speaking with patient's husband.  Patient resting on bipap, arouses to physical stimuli briefly.  Daughter at bedside.  No display of pain or acute distress.

## 2016-03-02 NOTE — Progress Notes (Signed)
Extubated to 2 L nasal cannula by Amy, RRT.  No respiratory distress, even rise and fall of chest and even breath sounds.  o2 sats 100%.  Hoarse soft voice.  Daughter and sister at bedside.  Continuing to monitor.

## 2016-03-02 NOTE — Progress Notes (Signed)
A&Ox4, coughing, no respiratory distress, o2 sats 100% on 2 L.  Stridor noted upon assessment.  RN and RRT amy called Dr. Alva Garnet and no answer therefore Thayer Headings, clerk paged MD.  MD came to bedside while waiting on return call from paging him.  MD aware of stridor and assessed patient and stated he would order something and for RRT to give neb.  RN monitoring patient closely.

## 2016-03-02 NOTE — Progress Notes (Signed)
RN went to waiting area and got patient's daughter to let her know that patient needs to be reintubated.  Daughter stated she needs to call her dad but that she does not think that they want to reintubate.  RN made Dr. Lavone Orn aware and Dr. Alva Garnet coming to ICU.o2 sats 99% on 100% fio2 bipap.  Patient arouses to voice but unable to follow commands, lethargic.

## 2016-03-02 NOTE — Progress Notes (Signed)
eLink Physician-Brief Progress Note Patient Name: Joan Erickson DOB: May 09, 1931 MRN: [id number]   Date of Service  03/02/2016  HPI/Events of Note  Prolonged desatn Increased EPAP , low Tv on Bipap - increased IPAP, FIO2 up to 100%  eICU Interventions  Obtained ABG >> acute resp acidosis Dr Alva Garnet to intubate      Intervention Category Major Interventions: Respiratory failure - evaluation and management  ALVA,RAKESH V. 03/02/2016, 5:41 PM

## 2016-03-02 NOTE — Progress Notes (Signed)
Patient taken off bipap per Dr. Alva Garnet order.  2 mg of morphine given IV prior to removing bipap.  Husband, daughter and other family at bedside.  Patient is comfort care as discussed with Hinton Dyer, NP.  Report given to [facility], RN who is now taking over patient's care.

## 2016-03-02 NOTE — Progress Notes (Signed)
  This was a complicated day as summarized in RN's notes. Pt passed SBT this AM and was extubated. She had stridor immediately post extubation but was not in great distress. She received nebulized steroids and IV methylprednisolone. BiPAP was initiated. She also underwent transfusion of one unit of PRBCs for Hgb 7.0.   Over the course of the day, she developed worsening dyspnea requiring increasing support on BiPAP. An ABG revealed severe hypercarbia and CXR revealed new onset of pulmonary edema.   Initially in response to the worsening hypercarbia, I intended to re-intubate her. However, her daughter insightfully expressed a desire to forgo re-intubation recognizing that the prognosis for a good survival was increasingly bleak.   Pt's husband arrived and I spoke with him as well. All are in agreement that our goal at this point should be comfort and dignity. To that end, she is now DNR. PRN morphine has been ordered and all other therapies have been discontinued. I spent 60 mins of critical care time making my initial assessment, providing subsequent evaluations and discussing goals of care with the family.   CCM time: 60 mins The above time includes time spent in consultation with patient and/or family members and reviewing care plan on multidisciplinary rounds  Merton Border, MD PCCM service Mobile [phone] Pager [phone]

## 2016-03-02 NOTE — Progress Notes (Addendum)
Dr. Elsworth Soho cameraed in room during end of patient's bath and clean up from stool when patient was desating down to 77% on bipap which had been turned up to 70% fio2 from 45% fio2 since first turn in bed during clean up.  Dr. Lavone Orn instructed RN to turn bipap up to 100% fio2 and o2 sats did not come up from increase in fio2 therefore MD instructed RN to call RT to come change bipap settings.  Amy RRT came to room and changed settings and patient's o2 sats came up into 90's.  Patient labored breathing, arouses to physical stimuli but does not follow commands, opens eyes only and then closes them.  Dr. Lavone Orn calling Dr. Alva Garnet to come see patient and obtaining cxray and abg STAT.  RN at bedside.

## 2016-03-02 NOTE — Progress Notes (Signed)
SLP Cancellation Note  Patient Details Name: Joan Erickson MRN: [id number] DOB: July 27, 1931   Cancelled treatment:       Reason Eval/Treat Not Completed: Medical issues which prohibited therapy;Patient not medically ready (chart reviewed; NSG consulted). NSG reported pt was experiencing stridor currently post recent extubation this morning. Due to pt's length of oral intubation(lack of swallowing) and her respiratory status currently, pt is at increased risk for aspiration and is recommended to remain NPO w/ oral care for oral hygiene and stimulation(for swallowing) until BSE can be completed. NSG agreed stating pt's status was not appropriate for BSE today. ST services will f/u tomorrow w/ pt.    Orinda Kenner, [place], CCC-SLP Kylee Umana 03/02/2016, 12:05 PM

## 2016-03-02 NOTE — Progress Notes (Signed)
Pharmacy Antibiotic Note  Joan Erickson is a 83 y.o. female with a h/o CKD recently started on HD admitted on 01/20/2016 with persistent PNA.  Pharmacy has been consulted for ceftazidime dosing due to Pseudomonas and Klebsiella in TA.  Plan: Ceftazidime 2 g iv q 48 hours.   Height: 5' (152.4 cm) Weight: 138 lb 0.1 oz (62.6 kg) IBW/kg (Calculated) : 45.5  Temp (24hrs), Avg:98.5 F (36.9 C), Min:98 F (36.7 C), Max:98.9 F (37.2 C)   Recent Labs Lab 02/27/16 0536  02/28/16 0440 02/29/16 0500 02/29/16 0918 03/01/16 0600 03/02/16 0537  WBC 10.5  --  16.6* 10.4  --  10.3 8.2  CREATININE 3.73*  < > 3.03* 4.29* 4.56* 3.45* 4.94*  < > = values in this interval not displayed.  Estimated Creatinine Clearance: 7.2 mL/min (by C-G formula based on SCr of 4.94 mg/dL (H)).    No Known Allergies  Antimicrobials this admission: Anti-infectives    Start     Dose/Rate Route Frequency Ordered Stop   03/02/16 1045  ceftAZIDime (FORTAZ) 2 gram/50 mL D5W IVPB - DUPLEX     2 g 100 mL/hr over 30 Minutes Intravenous  Once 03/02/16 1030 03/02/16 1133   03/02/16 1030  cefTAZidime (FORTAZ) 2 g in dextrose 5 % 50 mL IVPB  Status:  Discontinued     2 g 100 mL/hr over 30 Minutes Intravenous  Once 03/02/16 1024 03/02/16 1030   03/02/16 1015  cefTAZidime (FORTAZ) injection 2 g  Status:  Discontinued     2 g Intramuscular Once 03/02/16 1008 03/02/16 1024   02/29/16 0600  piperacillin-tazobactam (ZOSYN) IVPB 3.375 g  Status:  Discontinued     3.375 g 12.5 mL/hr over 240 Minutes Intravenous Every 12 hours 02/28/16 2110 02/28/16 2111   02/28/16 1800  piperacillin-tazobactam (ZOSYN) IVPB 3.375 g  Status:  Discontinued     3.375 g 12.5 mL/hr over 240 Minutes Intravenous Every 12 hours 02/28/16 1701 03/02/16 1000   03/16/2016 1130  ceFAZolin (ANCEF) IVPB 1 g/50 mL premix     1 g 100 mL/hr over 30 Minutes Intravenous  Once 02/26/2016 1126 03/16/2016 1210   02/18/16 1200  vancomycin (VANCOCIN) 500 mg in sodium  chloride 0.9 % 100 mL IVPB  Status:  Discontinued     500 mg 100 mL/hr over 60 Minutes Intravenous Every Dialysis 02/18/16 1043 02/18/16 1513   02/18/16 0230  piperacillin-tazobactam (ZOSYN) IVPB 3.375 g  Status:  Discontinued     3.375 g 12.5 mL/hr over 240 Minutes Intravenous Every 12 hours 02/18/16 0217 02/18/16 1513   02/18/16 0230  vancomycin (VANCOCIN) IVPB 1000 mg/200 mL premix  Status:  Discontinued     1,000 mg 200 mL/hr over 60 Minutes Intravenous  Once 02/18/16 0219 02/18/16 0223   02/18/16 0230  vancomycin (VANCOCIN) 1,250 mg in sodium chloride 0.9 % 250 mL IVPB     1,250 mg 166.7 mL/hr over 90 Minutes Intravenous  Once 02/18/16 0223 02/18/16 0417   02/08/2016 2300  ceFEPIme (MAXIPIME) 1 GM / 73mL IVPB premix  Status:  Discontinued     1 g 100 mL/hr over 30 Minutes Intravenous Every 24 hours 02/14/2016 2015 02/12/16 1729   02/10/2016 2030  vancomycin (VANCOCIN) IVPB 750 mg/150 ml premix     750 mg 150 mL/hr over 60 Minutes Intravenous  Once 01/26/2016 2019 02/07/2016 2323   02/10/2016 1230  cefTRIAXone (ROCEPHIN) 1 g in dextrose 5 % 50 mL IVPB  Status:  Discontinued     1 g  100 mL/hr over 30 Minutes Intravenous STAT 02/19/2016 1219 02/04/2016 1222   02/15/2016 1230  azithromycin (ZITHROMAX) 500 mg in dextrose 5 % 250 mL IVPB     500 mg 250 mL/hr over 60 Minutes Intravenous  Once 02/01/2016 1219 02/19/2016 1506   01/20/2016 1230  cefTRIAXone (ROCEPHIN) IVPB 1 g     1 g 100 mL/hr over 30 Minutes Intravenous  Once 02/09/2016 1222 02/05/2016 1332       Microbiology results: Recent Results (from the past 240 hour(s))  Culture, respiratory (NON-Expectorated)     Status: None   Collection Time: 02/28/16  6:00 PM  Result Value Ref Range Status   Specimen Description TRACHEAL ASPIRATE  Final   Special Requests NONE  Final   Gram Stain   Final    ABUNDANT WBC PRESENT,BOTH PMN AND MONONUCLEAR ABUNDANT GRAM NEGATIVE RODS RARE BUDDING YEAST SEEN Performed at [facility], [place] [address]  [place], [place] [address]    Culture   Final    ABUNDANT PSEUDOMONAS AERUGINOSA ABUNDANT KLEBSIELLA OXYTOCA    Report Status 03/02/2016 FINAL  Final   Organism ID, Bacteria KLEBSIELLA OXYTOCA  Final   Organism ID, Bacteria PSEUDOMONAS AERUGINOSA  Final      Susceptibility   Klebsiella oxytoca - MIC*    AMPICILLIN >=32 RESISTANT Resistant     CEFAZOLIN 8 SENSITIVE Sensitive     CEFEPIME <=1 SENSITIVE Sensitive     CEFTAZIDIME <=1 SENSITIVE Sensitive     CEFTRIAXONE <=1 SENSITIVE Sensitive     CIPROFLOXACIN <=0.25 SENSITIVE Sensitive     GENTAMICIN <=1 SENSITIVE Sensitive     IMIPENEM <=0.25 SENSITIVE Sensitive     TRIMETH/SULFA <=20 SENSITIVE Sensitive     AMPICILLIN/SULBACTAM 16 INTERMEDIATE Intermediate     PIP/TAZO 64 INTERMEDIATE Intermediate     Extended ESBL NEGATIVE Sensitive     * ABUNDANT KLEBSIELLA OXYTOCA   Pseudomonas aeruginosa - MIC*    CEFTAZIDIME 2 SENSITIVE Sensitive     CIPROFLOXACIN <=0.25 SENSITIVE Sensitive     GENTAMICIN <=1 SENSITIVE Sensitive     IMIPENEM 2 SENSITIVE Sensitive     PIP/TAZO <=4 SENSITIVE Sensitive     CEFEPIME <=1 SENSITIVE Sensitive     * ABUNDANT PSEUDOMONAS AERUGINOSA     Thank you for allowing pharmacy to be a part of this patient's care.  Napoleon Form 03/02/2016 2:47 PM

## 2016-03-02 NOTE — Progress Notes (Signed)
[facility]  ROUNDING NOTE   Subjective:   Daughter bedside.  Extubated yesterday but reintubated over the weekend.  Still A Fib  Getting ready for extubation this morning  Objective:  Vital signs in last 24 hours:  Temp:  [98 F (36.7 C)-98.9 F (37.2 C)] 98.7 F (37.1 C) (02/12 0753) Pulse Rate:  [45-107] 71 (02/12 0800) Resp:  [0-19] 13 (02/12 0800) BP: (70-131)/(44-96) 131/96 (02/12 0800) SpO2:  [98 %-100 %] 100 % (02/12 0800) FiO2 (%):  [28 %-40 %] 28 % (02/12 0332) Weight:  [62.6 kg (138 lb 0.1 oz)] 62.6 kg (138 lb 0.1 oz) (02/12 0500)  Weight change: -3.7 kg (-8 lb 2.5 oz) Filed Weights   02/29/16 1700 03/01/16 0452 03/02/16 0500  Weight: 65.6 kg (144 lb 11.2 oz) 62.1 kg (136 lb 14.5 oz) 62.6 kg (138 lb 0.1 oz)    Intake/Output: I/O last 3 completed shifts: In: 1264.4 [I.V.:355.3; NG/GT:759.1; IV Piggyback:150] Out: 20 [Urine:20]   Intake/Output this shift:  Total I/O In: 30 [I.V.:10; NG/GT:20] Out: -   Physical Exam: General: Critically ill  Head: ETT, OGT  Eyes: Anicteric,   Neck: Supple,   Lungs:  Coarse, mechanical ventilation FiO2 28 %, left sided chest tube  Heart: Irregular, A Fib on telemetry  Abdomen:  Soft, nontender,   Extremities: 1+ peripheral and dependent edema especially arms  Neurologic: Intubated,  Able to follow commands  Skin: No lesions  Access: RIJ permcath    Basic Metabolic Panel:  Recent Labs Lab 02/26/16 0455 02/26/16 1821 02/27/16 0536 02/27/16 0930 02/28/16 0440 02/29/16 0500 02/29/16 0918 03/01/16 0600 03/02/16 0537  NA 134*  --  134* 137 137 137 137 141 139  K 3.7 3.5 3.4* 3.4* 3.9 3.8 3.9 3.9 3.5  CL 98*  --  100* 98* 98* 99* 98* 101 100*  CO2 29  --  28 32 33* 31 32 33* 32  GLUCOSE 140*  --  171* 121* 137* 144* 133* 115* 89  BUN 53*  --  43* 21* 33* 58* 62* 42* 59*  CREATININE 4.80*  --  3.73* 1.99* 3.03* 4.29* 4.56* 3.45* 4.94*  CALCIUM 7.8*  --  7.4* 7.7* 7.8* 7.9* 7.9* 8.2* 8.1*  MG 2.2  1.9 1.9  --   --  2.1  --   --  2.2  PHOS 2.2* 1.3* 1.9* 1.5* 3.1 3.1 3.5  --   --     Liver Function Tests:  Recent Labs Lab 02/27/16 0930 02/29/16 0918  ALBUMIN 2.2* 2.0*   No results for input(s): LIPASE, AMYLASE in the last 168 hours. No results for input(s): AMMONIA in the last 168 hours.  CBC:  Recent Labs Lab 02/27/16 0536 02/28/16 0440 02/29/16 0500 03/01/16 0600 03/02/16 0537  WBC 10.5 16.6* 10.4 10.3 8.2  NEUTROABS  --   --  9.0*  --   --   HGB 8.0* 9.4* 7.3* 7.4* 7.0*  HCT 24.1* 29.3* 22.8* 22.0* 22.0*  MCV 86.3 87.4 86.8 84.9 85.1  PLT 209 271 197 201 219    Cardiac Enzymes: No results for input(s): CKTOTAL, CKMB, CKMBINDEX, TROPONINI in the last 168 hours.  BNP: Invalid input(s): POCBNP  CBG:  Recent Labs Lab 03/01/16 1623 03/01/16 1935 03/01/16 2340 03/02/16 0401 03/02/16 0740  GLUCAP 138* 133* 123* 98 65    Microbiology: Results for orders placed or performed during the hospital encounter of 02/12/2016  Blood culture (routine x 2)     Status: Abnormal   Collection  Time: 02/07/2016 12:31 PM  Result Value Ref Range Status   Specimen Description BLOOD RIGHT AC  Final   Special Requests   Final    BOTTLES DRAWN AEROBIC AND ANAEROBIC AER 11ML ANA 8ML   Culture  Setup Time   Final    GRAM POSITIVE COCCI AEROBIC BOTTLE ONLY CRITICAL RESULT CALLED TO, READ BACK BY AND VERIFIED WITH:  [place] AT 9179 02/05/2016 SDR    Culture (A)  Final    FACKLAMIA HOMINIS Standardized susceptibility testing for this organism is not available. Performed at [facility], [place] [address] [place], [place] [address]    Report Status 02/17/2016 FINAL  Final  Blood Culture ID Panel (Reflexed)     Status: None   Collection Time: 02/09/2016 12:31 PM  Result Value Ref Range Status   Enterococcus species NOT DETECTED NOT DETECTED Final   Listeria monocytogenes NOT DETECTED NOT DETECTED Final   Staphylococcus species NOT DETECTED NOT DETECTED Final    Staphylococcus aureus NOT DETECTED NOT DETECTED Final   Streptococcus species NOT DETECTED NOT DETECTED Final   Streptococcus agalactiae NOT DETECTED NOT DETECTED Final   Streptococcus pneumoniae NOT DETECTED NOT DETECTED Final   Streptococcus pyogenes NOT DETECTED NOT DETECTED Final   Acinetobacter baumannii NOT DETECTED NOT DETECTED Final   Enterobacteriaceae species NOT DETECTED NOT DETECTED Final   Enterobacter cloacae complex NOT DETECTED NOT DETECTED Final   Escherichia coli NOT DETECTED NOT DETECTED Final   Klebsiella oxytoca NOT DETECTED NOT DETECTED Final   Klebsiella pneumoniae NOT DETECTED NOT DETECTED Final   Proteus species NOT DETECTED NOT DETECTED Final   Serratia marcescens NOT DETECTED NOT DETECTED Final   Haemophilus influenzae NOT DETECTED NOT DETECTED Final   Neisseria meningitidis NOT DETECTED NOT DETECTED Final   Pseudomonas aeruginosa NOT DETECTED NOT DETECTED Final   Candida albicans NOT DETECTED NOT DETECTED Final   Candida glabrata NOT DETECTED NOT DETECTED Final   Candida krusei NOT DETECTED NOT DETECTED Final   Candida parapsilosis NOT DETECTED NOT DETECTED Final   Candida tropicalis NOT DETECTED NOT DETECTED Final  Blood culture (routine x 2)     Status: None   Collection Time: 01/25/2016 12:32 PM  Result Value Ref Range Status   Specimen Description BLOOD LEFT HAND  Final   Special Requests   Final    BOTTLES DRAWN AEROBIC AND ANAEROBIC AER 13ML ANA 13ML   Culture NO GROWTH 5 DAYS  Final   Report Status 02/16/2016 FINAL  Final  MRSA PCR Screening     Status: None   Collection Time: 01/24/2016 10:32 PM  Result Value Ref Range Status   MRSA by PCR NEGATIVE NEGATIVE Final    Comment:        The GeneXpert MRSA Assay (FDA approved for NASAL specimens only), is one component of a comprehensive MRSA colonization surveillance program. It is not intended to diagnose MRSA infection nor to guide or monitor treatment for MRSA infections.   Culture,  expectorated sputum-assessment     Status: None   Collection Time: 02/08/2016  3:24 PM  Result Value Ref Range Status   Specimen Description SPUTUM  Final   Special Requests Normal  Final   Sputum evaluation   Final    Sputum specimen not acceptable for testing.  Please recollect.   RESULT CALLED TO, READ BACK BY AND VERIFIED WITH: Champ Mungo 01/23/2016 1608 KLW    Report Status 02/06/2016 FINAL  Final  Culture, blood (single) w Reflex to ID  Panel     Status: None   Collection Time: 02/17/16  2:45 PM  Result Value Ref Range Status   Specimen Description BLOOD RIGHT ANTECUBITAL  Final   Special Requests   Final    BOTTLES DRAWN AEROBIC AND ANAEROBIC ANA 8CC AER [place]   Culture NO GROWTH 5 DAYS  Final   Report Status 02/22/2016 FINAL  Final  Culture, blood (Routine X 2) w Reflex to ID Panel     Status: None   Collection Time: 02/18/16  6:12 AM  Result Value Ref Range Status   Specimen Description BLOOD R HAND  Final   Special Requests BOTTLES DRAWN AEROBIC AND ANAEROBIC 1ML  Final   Culture NO GROWTH 5 DAYS  Final   Report Status 02/23/2016 FINAL  Final  Culture, blood (Routine X 2) w Reflex to ID Panel     Status: None   Collection Time: 02/18/16  6:12 AM  Result Value Ref Range Status   Specimen Description BLOOD LEFT HAND  Final   Special Requests BOTTLES DRAWN AEROBIC AND ANAEROBIC 4ML  Final   Culture NO GROWTH 5 DAYS  Final   Report Status 02/23/2016 FINAL  Final  Culture, respiratory (NON-Expectorated)     Status: None   Collection Time: 02/28/16  6:00 PM  Result Value Ref Range Status   Specimen Description TRACHEAL ASPIRATE  Final   Special Requests NONE  Final   Gram Stain   Final    ABUNDANT WBC PRESENT,BOTH PMN AND MONONUCLEAR ABUNDANT GRAM NEGATIVE RODS RARE BUDDING YEAST SEEN Performed at [facility], [place] [address], [place], [place] [address]    Culture   Final    ABUNDANT PSEUDOMONAS AERUGINOSA ABUNDANT KLEBSIELLA OXYTOCA    Report Status 03/02/2016  FINAL  Final   Organism ID, Bacteria KLEBSIELLA OXYTOCA  Final   Organism ID, Bacteria PSEUDOMONAS AERUGINOSA  Final      Susceptibility   Klebsiella oxytoca - MIC*    AMPICILLIN >=32 RESISTANT Resistant     CEFAZOLIN 8 SENSITIVE Sensitive     CEFEPIME <=1 SENSITIVE Sensitive     CEFTAZIDIME <=1 SENSITIVE Sensitive     CEFTRIAXONE <=1 SENSITIVE Sensitive     CIPROFLOXACIN <=0.25 SENSITIVE Sensitive     GENTAMICIN <=1 SENSITIVE Sensitive     IMIPENEM <=0.25 SENSITIVE Sensitive     TRIMETH/SULFA <=20 SENSITIVE Sensitive     AMPICILLIN/SULBACTAM 16 INTERMEDIATE Intermediate     PIP/TAZO 64 INTERMEDIATE Intermediate     Extended ESBL NEGATIVE Sensitive     * ABUNDANT KLEBSIELLA OXYTOCA   Pseudomonas aeruginosa - MIC*    CEFTAZIDIME 2 SENSITIVE Sensitive     CIPROFLOXACIN <=0.25 SENSITIVE Sensitive     GENTAMICIN <=1 SENSITIVE Sensitive     IMIPENEM 2 SENSITIVE Sensitive     PIP/TAZO <=4 SENSITIVE Sensitive     CEFEPIME <=1 SENSITIVE Sensitive     * ABUNDANT PSEUDOMONAS AERUGINOSA    Coagulation Studies:  Recent Labs  02/29/16 0500 03/01/16 0600 03/02/16 0537  LABPROT 31.8* 30.0* 31.8*  INR 3.00 2.79 3.00    Urinalysis: No results for input(s): COLORURINE, LABSPEC, PHURINE, GLUCOSEU, HGBUR, BILIRUBINUR, KETONESUR, PROTEINUR, UROBILINOGEN, NITRITE, LEUKOCYTESUR in the last 72 hours.  Invalid input(s): APPERANCEUR    Imaging: Dg Chest 1 View  Result Date: 03/02/2016 CLINICAL DATA:  Shortness of breath. EXAM: CHEST 1 VIEW COMPARISON:  03/01/2016. FINDINGS: Endotracheal tube, left subclavian line, right IJ dialysis catheter, NG tube in stable position. Stable cardiomegaly. Interim improvement of interstitial infiltrates  and basilar atelectasis. Small bilateral pleural effusions. IMPRESSION: 1. Lines and tubes in stable position. 2.  Stable cardiomegaly . 3. Interim improvement of basilar interstitial prominence and atelectasis. Small bilateral pleural effusions. Electronically  Signed   By: Marcello Moores  Register   On: 03/02/2016 06:58   Dg Chest 1 View  Result Date: 03/01/2016 CLINICAL DATA:  Shortness of Breath EXAM: CHEST 1 VIEW COMPARISON:  02/29/2016 FINDINGS: Support devices are stable. Right small bore chest tube remains in place. No pneumothorax. Cardiomegaly. Mild vascular congestion. Left lower lobe atelectasis or infiltrate with small left effusion, similar prior study. Small right effusion also noted, stable. IMPRESSION: Left lower lobe atelectasis or infiltrate, stable. Small bilateral effusions. Cardiomegaly, vascular congestion. Electronically Signed   By: Rolm Baptise M.D.   On: 03/01/2016 07:24   Dg Chest 1 View  Result Date: 02/29/2016 CLINICAL DATA:  Hypoxia, shortness of breath, asthma EXAM: CHEST 1 VIEW COMPARISON:  02/29/2016 FINDINGS: Endotracheal tube terminates 1.5 cm above the carina. Lungs are essentially clear. No focal consolidation. Left chest drain. No pneumothorax is seen. Right IJ dual lumen catheter terminates in the upper right atrium. Left subclavian venous catheter terminates in the mid SVC. Mild cardiomegaly. IMPRESSION: Endotracheal tube terminates 1.5 cm above the carina. Left chest drain.  No pneumothorax is seen. Additional support apparatus as above. Electronically Signed   By: Julian Hy M.D.   On: 02/29/2016 16:46   Dg Abd 1 View  Result Date: 02/29/2016 CLINICAL DATA:  Advancement of OG tube EXAM: ABDOMEN - 1 VIEW COMPARISON:  Film from earlier in the same day FINDINGS: The orogastric catheter is now seen in the distal stomach and possibly extending into the proximal small bowel. Postsurgical changes are seen. No obstructive changes are noted. IMPRESSION: Orogastric catheter is been advanced as described. Electronically Signed   By: Inez Catalina M.D.   On: 02/29/2016 19:45   Dg Abd 1 View  Result Date: 02/29/2016 CLINICAL DATA:  Evaluate OG tube EXAM: ABDOMEN - 1 VIEW COMPARISON:  None. FINDINGS: The side port of the OG tube is  just below the GE junction with the tip in the gastric cardia. Left pleural effusion. No other interval changes. IMPRESSION: The side port of the OG tube is just below the GE junction with the tip in the gastric cardia. Recommend advancement. Electronically Signed   By: Dorise Bullion III M.D   On: 02/29/2016 18:43     Medications:   . sodium chloride    . propofol (DIPRIVAN) infusion Stopped (02/28/16 0820)   . sodium chloride   Intravenous Once  . amiodarone  400 mg Per Tube BID  . arformoterol  15 mcg Nebulization BID  . budesonide (PULMICORT) nebulizer solution  0.25 mg Nebulization BID  . cefTAZidime-D5W  2 g Intravenous Once  . chlorhexidine gluconate (MEDLINE KIT)  15 mL Mouth Rinse BID  . epoetin (EPOGEN/PROCRIT) injection  10,000 Units Intravenous Q T,Th,Sa-HD  . famotidine  20 mg Per Tube Daily  . feeding supplement (PRO-STAT SUGAR FREE 64)  30 mL Per Tube Daily  . feeding supplement (VITAL HIGH PROTEIN)  1,000 mL Per Tube Q24H  . mouth rinse  15 mL Mouth Rinse 10 times per day  . pravastatin  20 mg Per Tube q1800  . senna-docusate  2 tablet Per Tube BID  . sodium chloride flush  3 mL Intravenous Q12H   albuterol, bisacodyl, fentaNYL (SUBLIMAZE) injection, lip balm, metoprolol, midazolam, nitroGLYCERIN, [DISCONTINUED] ondansetron **OR** ondansetron (ZOFRAN) IV  Assessment/ Plan:  Ms. DEVYN GRIFFING is a 83 y.o. 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 ESRD: TTS schedule - Monitor for dialysis need.  - Next treatment scheduled for Tuesday.  - Outpatient dialysis planning is [facility] to be followed by [facility].   2. Hypertension with atrial fibrillation:  metoprolol  3. Anemia of chronic kidney disease: status post PRBC  transfusion 2/7. Hemoglobin 7.0  - epo with TTS treatments.   4. Secondary Hyperparathyroidism: phos and calcium at goal. Not currently on binders.   5. Hyponatremia: secondary to renal failure and volume. Improved with dialysis  6. Acute Respiratory Failure:  with pneumothorax and pleural effusions.  - Chest tube placed on on 2/9.  - extubation planned for today.    LOS: [place] 2/12/201810:50 AM

## 2016-03-03 DIAGNOSIS — Z515 Encounter for palliative care: Secondary | ICD-10-CM

## 2016-03-03 LAB — TYPE AND SCREEN
ABO/RH(D): O POS
Antibody Screen: NEGATIVE
Unit division: 0

## 2016-03-03 MED ORDER — LORAZEPAM 2 MG/ML IJ SOLN
1.0000 mg | INTRAMUSCULAR | Status: DC | PRN
  Administered 2016-03-03: 2 mg via INTRAVENOUS
  Filled 2016-03-03: qty 1

## 2016-03-10 ENCOUNTER — Telehealth: Payer: Self-pay | Admitting: Internal Medicine

## 2016-03-10 NOTE — Telephone Encounter (Signed)
Received death certificate from [place].

## 2016-03-19 NOTE — Progress Notes (Signed)
Patients family (niece) concerned with patients care. She did have the password and asked what the plan of care was. I advised for them to speak with the husband and children. At this point we are making her comfortable. Family including husband has not wanted [place] to give patient any morphine or ativan. Patient appears to be resting comfortably at times with aganol respirations.

## 2016-03-19 NOTE — Progress Notes (Signed)
Report given to [facility]. Patient has not been able to follow any simple commands nor do anything purposeful. Patient was given 2 mg of morphine during my shift. Husband called and notified of patient transfer. Dr Leonidas Romberg spoke with family this am and afternoon.

## 2016-03-19 NOTE — Progress Notes (Signed)
[place] responded to a page to provide comfort and support for the family of a Pt in [place] who passed away. When [facility] arrived the sister, daughter and a neighbor of the Pt were in the [place] spoke to them and comforted them. While there, other family members arrived, [facility] stayed with them for a while until the Pt's pastor arrived. [place] provided emotional support and presence to the bereaved family. [place] also notified the Administrative Nurse that the family wanted the funeral home to be contacted.     03-15-16 1900  Clinical Encounter Type  Visited With Patient;Patient and family together  Visit Type Initial;Social support;Death  Referral From Nurse  Consult/Referral To Chaplain  Spiritual Encounters  Spiritual Needs Prayer;Emotional;Grief support

## 2016-03-19 NOTE — Discharge Summary (Addendum)
Physician Discharge Summary  Patient ID: Joan Erickson MRN: [id number] DOB/AGE: [birth year] 81 y.o.  Admit date: 02/05/2016 Discharge date: 03/04/16 Admission Diagnoses: Acute renal failure Pneumonia with sepsis.  Afib with RVR.  Acute renal failure.   Discharge Diagnoses:  Active Problems:   ARF (acute renal failure) (HCC)   Spontaneous pneumothorax New onset HD TTS.  Pleural effusions with volume overload.  Acute respiratory failure with pulmonary edema due to volume overload requiring intubation.   Discharged Condition: expired.   Hospital Course:  83 F with history of CAF, asthma, CKD admitted 02/16/2016 with SOB and atrial flutter. Underwent temporary HD cath insertion 01/26. Transferred to ICU/SDU 02/01 with atrial flutter and rapid ventricular response. Underwent tunneled catheter placement 02/01. She was hypercarbic after that procedure and was transiently on BiPAP. Transferred to back to telemetry floor on 2/3. She has been maintained on cardizem drip, Followed by nephrology, undergoing ultrafiltration and her oxygen had been weaned down to 2 L., She had been planned for discharge on 2/06 on oxygen 2 L at rest, with 6 L when ambulating. However, early  Morning 2/6, the patient was found to be very short of breath with diminished responsiveness, RR was called. She was transferred to ICU where she was quickly intubated and a left subclavian central line placed She was noted to be in Afib with RVR and had florid pulmonary edema, she underwent HD over subsequent days but it was noted on both 2/7 and 2/8 that she would only tolerate weaning trial for 15-20 min and would become tachypneic.  On 2/09 it was noted that she had a moderate, 3 cm left side ptx. A left pigtail chest tube was placed with good resolution.  CT chest on 2/9 showed moderate right pleural effusion with small left, no evidence of pneumonia, ptx resolved, otherwise unremarkable lungs.  On 2/10 family requested transfer  to [facility]. Right thoracentesis was recommended to help with extubation, but they preferred to defer that until she was transferred. She was dialyzed further on 2/10  and tolerated weaning trial much better and decision was taken to extubate.  She tolerated this well, was awake, alert and on 2L without resp distress without stridor.   After 30 min of doing well, the patient developed tachypnea, her sats began to drop. In another 10 min, the patient was requiring NRB with sat of 85%, she developed reduced mental status and became decreasingly responsive, then became obtunded. Flash pulmonary edema was suspected. She was reintubated without difficulty, no edema of the airway was noted.  On 2/11- coumadin was held for potential right thoracentesis upon transfer. Resp. 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  On 2/12 the patient was extubated once again after passing a weaning trial but failed and was at the point of requiring intubation again due to stridor. At that point family intervened and decided to make the patient comfort measures only. Orders were entered, pt made DNR.     Consults: ID, nephrology, vascular surgery.   Significant Diagnostic Studies: CT chest.   Treatments: HD.   Discharge Exam: Blood pressure (!) 96/56, pulse 86, temperature 97.8 F (36.6 C), temperature source Axillary, resp. rate (!) 21, height 5' (1.524 m), weight 146 lb 2.6 oz (66.3 kg), SpO2 99 %.   Disposition: Expired.   Signed: Laverle Hobby 03/01/2016, 10:29 AM

## 2016-03-19 NOTE — Progress Notes (Signed)
Nutrition Brief Note  Chart reviewed. Pt now transitioning to comfort care only. NPO post extubation No further nutrition interventions warranted at this time.  Please re-consult as needed.   Kerman Passey [place], Lupus, LDN [phone] Pager  [phone] Weekend/On-Call Pager

## 2016-03-19 NOTE — Progress Notes (Signed)
Pt was removed from room by funeral home about 2000.

## 2016-03-19 NOTE — Progress Notes (Signed)
Pt is comfortable. Receiving intermittent morphine. Daughter was in earlier today and is satisfied with current care and status. I will try to touch base with pt's husband today as well  Merton Border, MD PCCM service Mobile [phone] Pager [phone] 03/26/2016

## 2016-03-19 NOTE — Progress Notes (Signed)
Pt evaluated, currently off monitor, RR=12/min. Pt appears comfortable/comatose. D/w ICU charge, given paucity of available beds, will place transfer orders to floor. Family at bedside informed and agreeable. Will keep on PCCM list; DC summary updated;  expect pt to pass within a day or so.   Marda Stalker, M.D.  03-10-16

## 2016-03-19 DEATH — deceased

## 2018-02-12 IMAGING — CR DG CHEST 2V
1 series · 2 of 2 positions shown · non-contrast
Comparison: 12/26/2015

CLINICAL DATA: Shortness of breath

EXAM:
CHEST  2 VIEW

[Series 1: dg chest 2 view · 0.14mm/px · 2 of 2 slices shown]
[im 1/2]
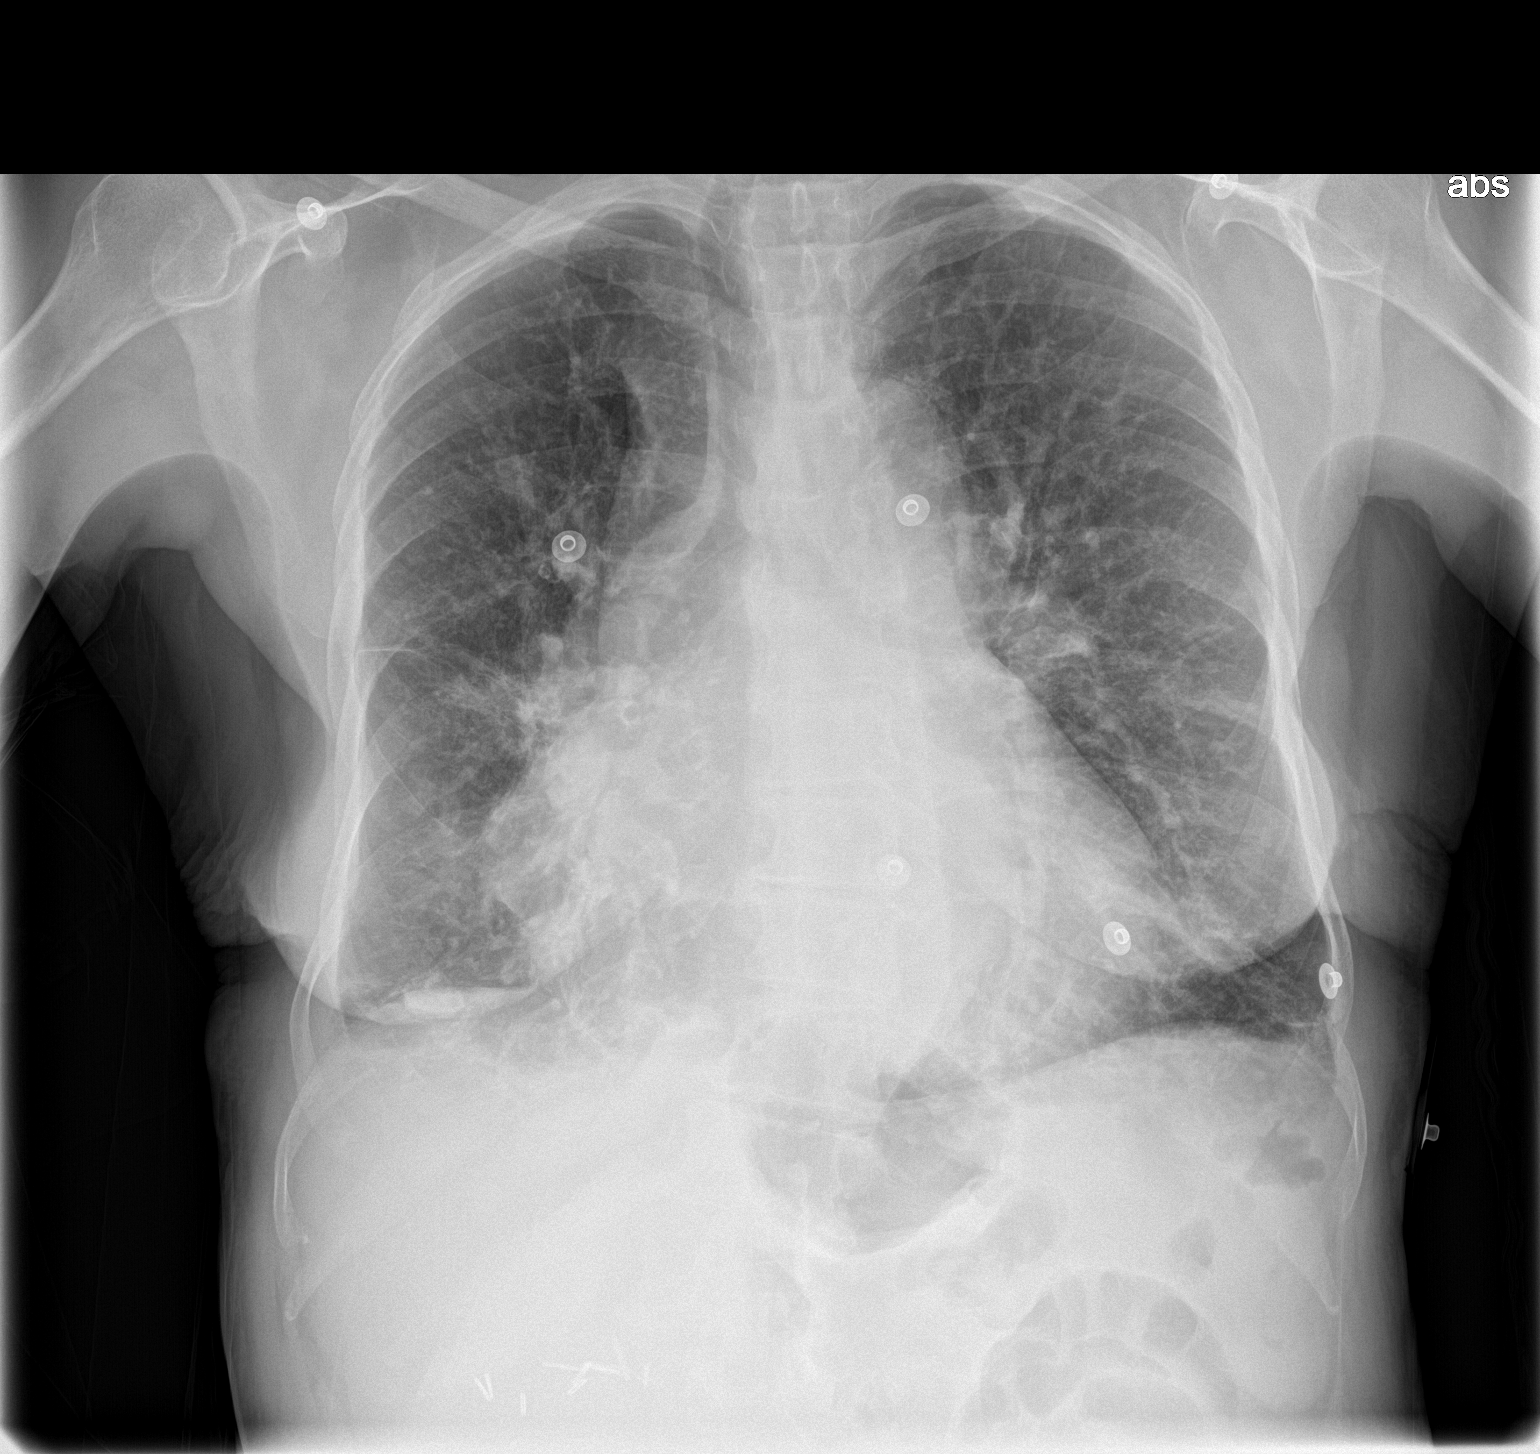
[im 2/2]
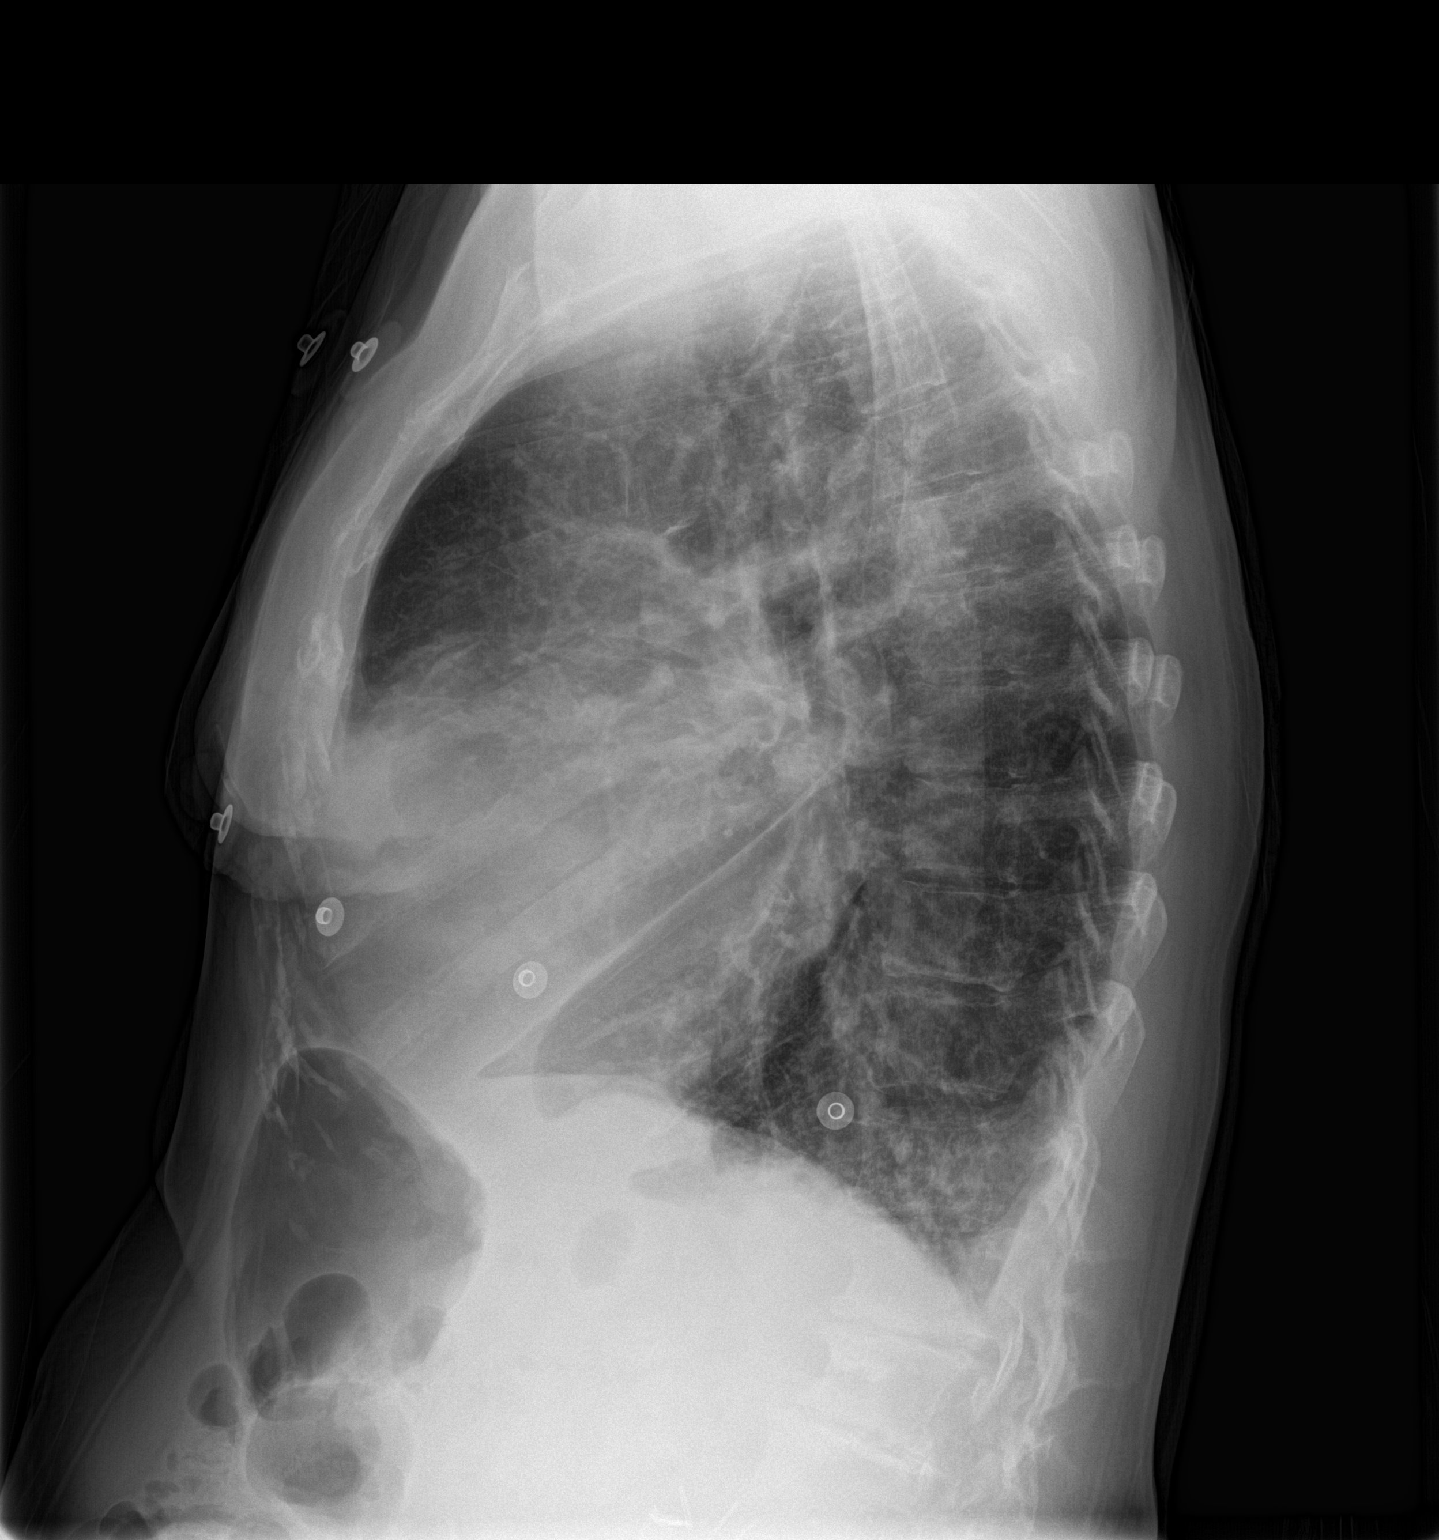

[2 of 2 positions shown; findings below may reference images not displayed]

FINDINGS: Chronic cardiomegaly. There is basilar and right middle lobe patchy
opacity with trace effusions. When accounting for rotation no
convincing vascular pedicle distention. No Kerley lines or
pneumothorax. Cholecystectomy clips.
IMPRESSION: 1. Patchy densities at the bases and right middle lobe persists.
This could reflect recurrent pneumonia or treatment failure.
Followup PA and lateral chest X-ray is recommended in 3-4 weeks
following any therapy to ensure resolution and establish baseline.
2. Cardiomegaly and trace pleural effusions.  No pulmonary edema.

## 2018-02-26 IMAGING — DX DG CHEST 1V PORT
1 series · 1 of 1 positions shown · non-contrast
Comparison: Portable chest x-ray February 23, 2016

CLINICAL DATA: Acute renal failure, intubated patient, history of
asthma, diabetes

EXAM:
PORTABLE CHEST 1 VIEW

[chest ap]
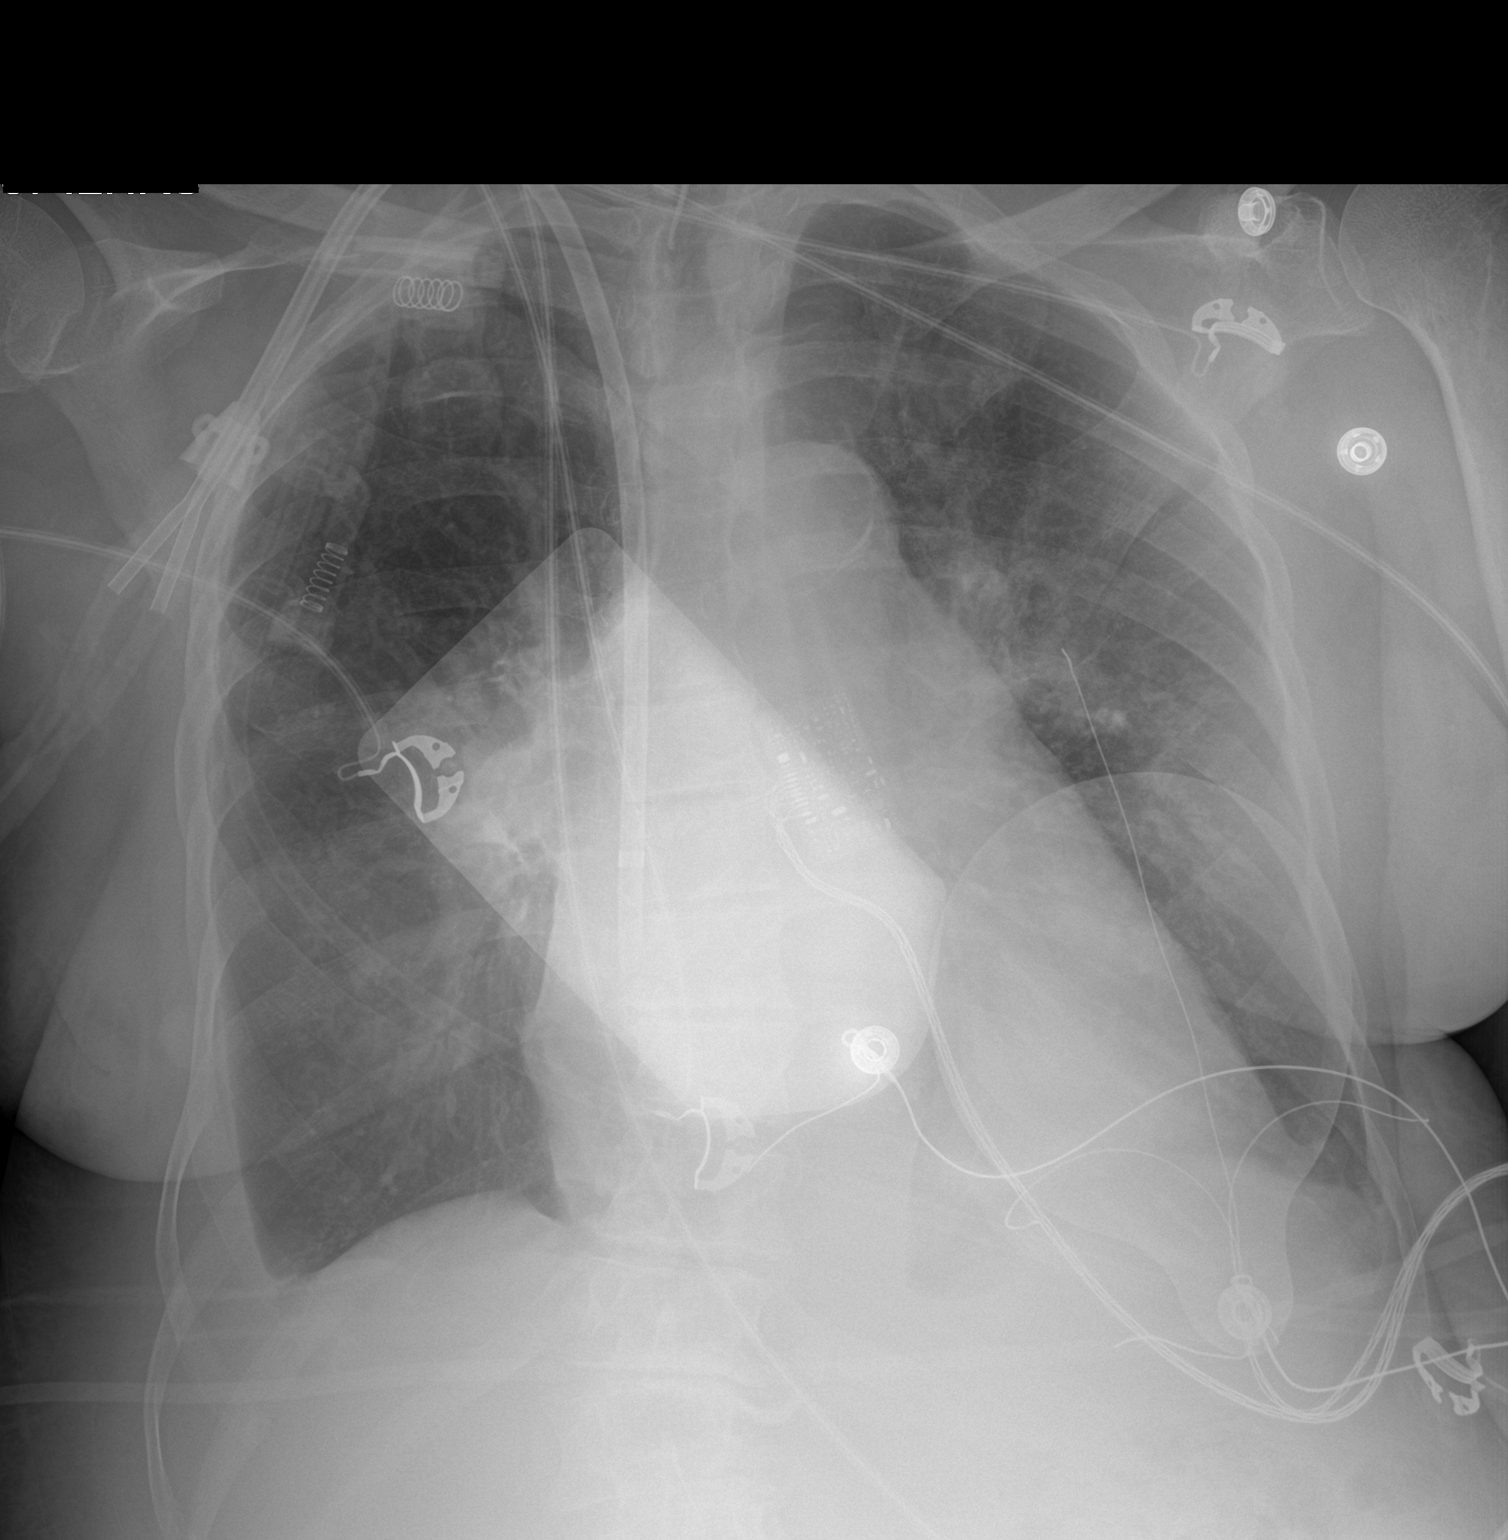

[1 of 1 positions shown; findings below may reference images not displayed]

FINDINGS: The lungs are mildly hyperinflated. There is a persistent right
pleural effusion layering posteriorly. There is increased density in
the left lower lobe with improved density in the right lower lobe.
External pacemaker defibrillator pads are present. The cardiac
silhouette is top-normal in size. The pulmonary vascularity remains
mildly engorged but has improved since yesterday's study.

The endotracheal tube tip lies above the level of the clavicular
heads. This is approximately 7.5 cm above the carina. The large
caliber dual-lumen dialysis catheter tip projects at or just above
the cavoatrial junction.
IMPRESSION: Decreased pulmonary interstitial edema today. Persistent left lower
lobe atelectasis and moderate size right pleural effusion.

Thoracic aortic atherosclerosis.

High positioning of the endotracheal tube. Advancement by 4 cm is
recommended.

These results will be called to the ordering clinician or
representative by the Radiologist Assistant, and communication
documented in the PACS or zVision Dashboard.

## 2018-02-26 IMAGING — DX DG ABDOMEN 1V
1 series · 1 of 1 positions shown · non-contrast
Comparison: Portable chest x-ray of today's date

CLINICAL DATA: Status post orogastric tube placement

EXAM:
ABDOMEN - 1 VIEW

[abdomen kub]
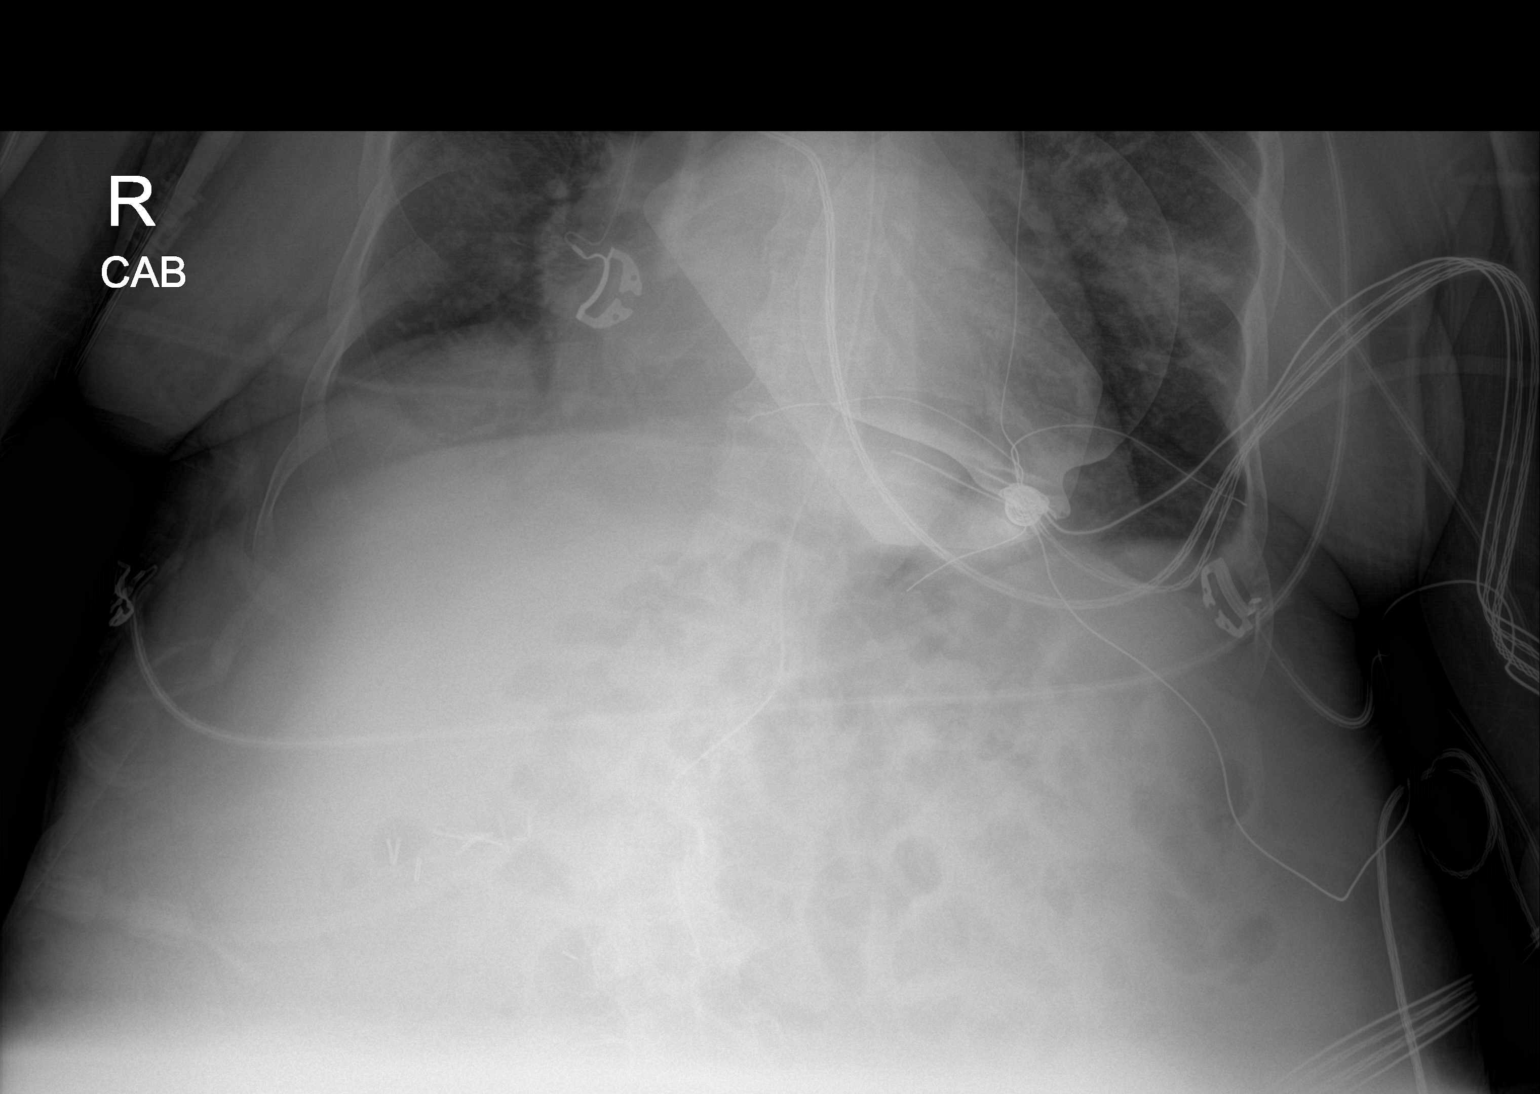

[1 of 1 positions shown; findings below may reference images not displayed]

FINDINGS: The orogastric tube tip and proximal port lie in the region of the
gastric cardia. There is patchy interstitial density at the left
lung base. The bowel gas pattern is unremarkable. There surgical
clips in the left upper quadrant of the abdomen.
IMPRESSION: The orogastric tube tip in proximal port lie in the region of the
gastric cardia. Advancement by 5-10 cm would be useful.

## 2018-02-26 IMAGING — DX DG CHEST 1V PORT
1 series · 1 of 1 positions shown · non-contrast
Comparison: Portable chest x-ray February 25, 2016 at [DATE] a.m..

CLINICAL DATA: Chronic renal insufficiency stage IV with
superimposed acute renal failure requiring dialysis. , respiratory
failure, history of asthma, diabetes

EXAM:
PORTABLE CHEST 1 VIEW

[chest ap]
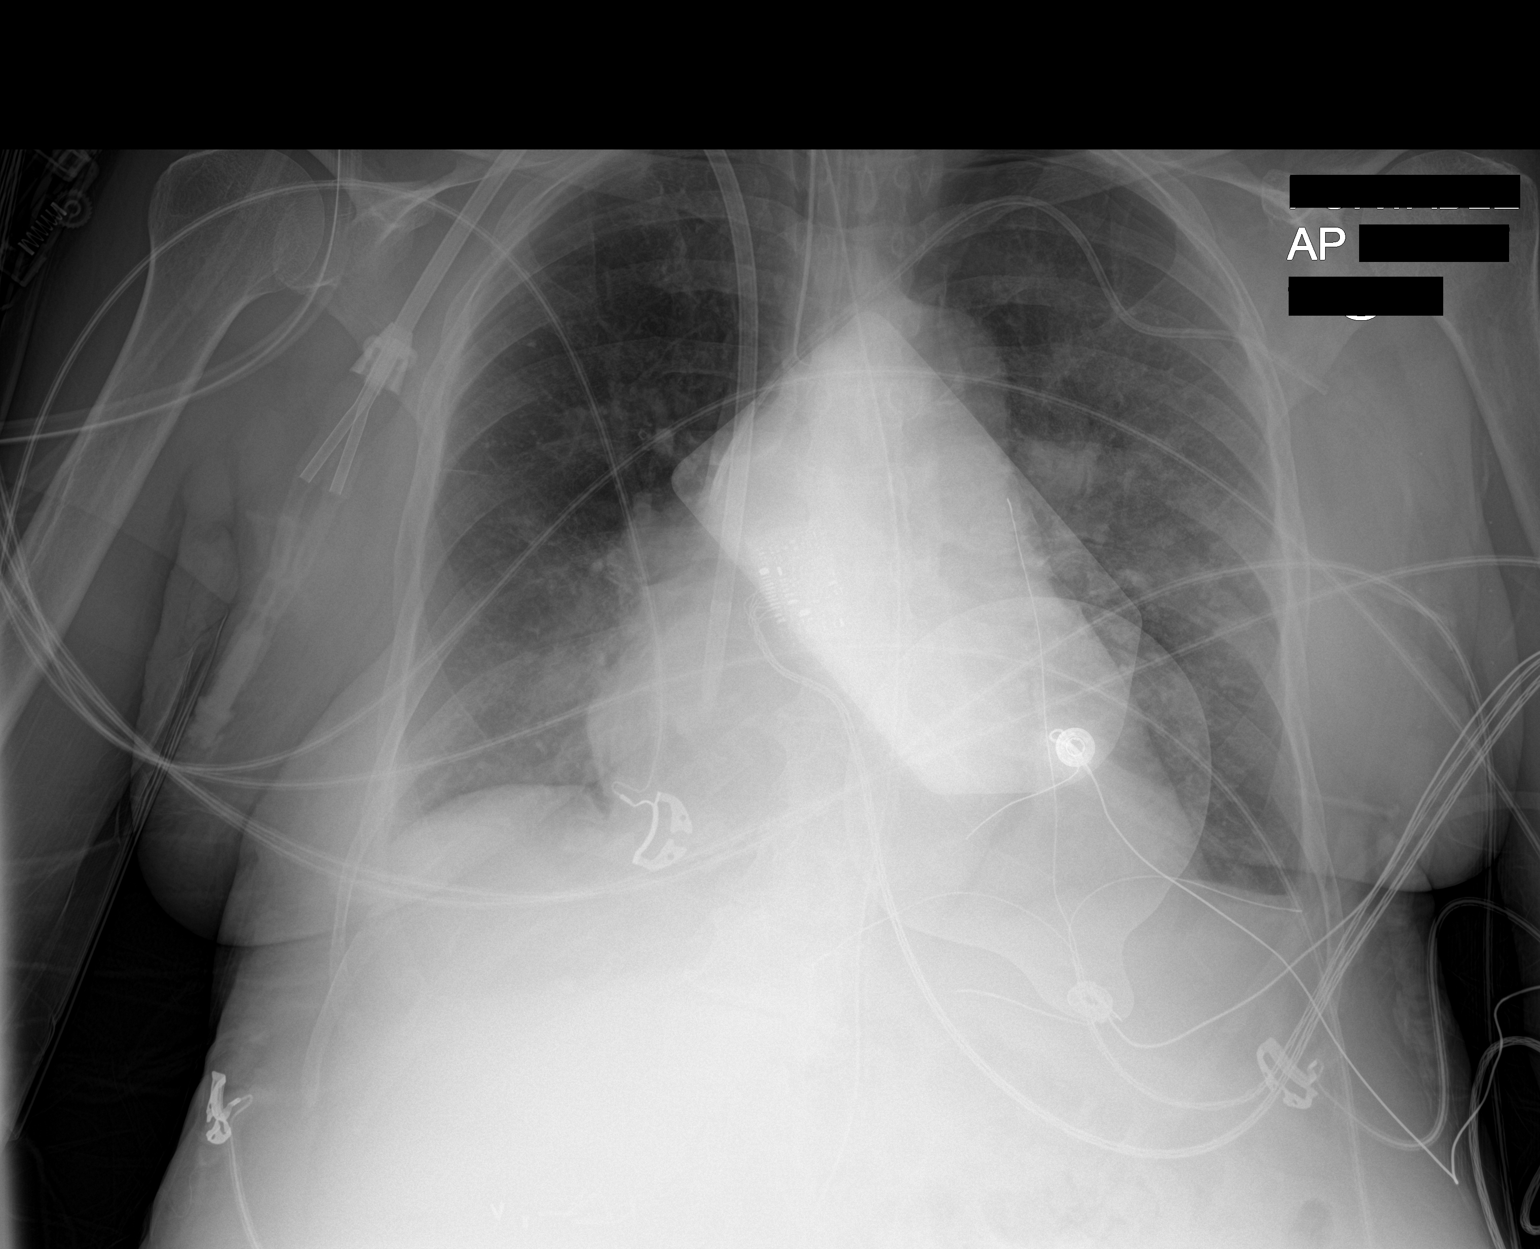

[1 of 1 positions shown; findings below may reference images not displayed]

FINDINGS: The endotracheal tube tip has been and VATS and now all lies
approximately 2.5 cm above the carina. The lungs are well-expanded.
The interstitial markings are mildly prominent. The cardiac
silhouette is enlarged. There is are small bilateral pleural
effusions. The pulmonary vascularity is mildly engorged centrally.
The esophagogastric tube proximal port is visible in projects in the
region of the gastric cardia. The dual-lumen dialysis catheter tip
projects at the cavoatrial junction. The left subclavian venous
catheter tip projects over the midportion of the SVC. External
pacemaker defibrillator pads are present. There is aortic arch
calcification.
IMPRESSION: Interval advancement of the endotracheal tube such that the tip now
lies 2.5 cm above the carina.

Mild pulmonary interstitial edema and small bilateral pleural
effusions.

Thoracic aortic atherosclerosis.

## 2018-02-27 IMAGING — DX DG CHEST 1V
1 series · 1 of 1 positions shown · non-contrast
Comparison: 02/25/2016

CLINICAL DATA: Acute respiratory failure.

EXAM:
CHEST 1 VIEW

[chest ap]
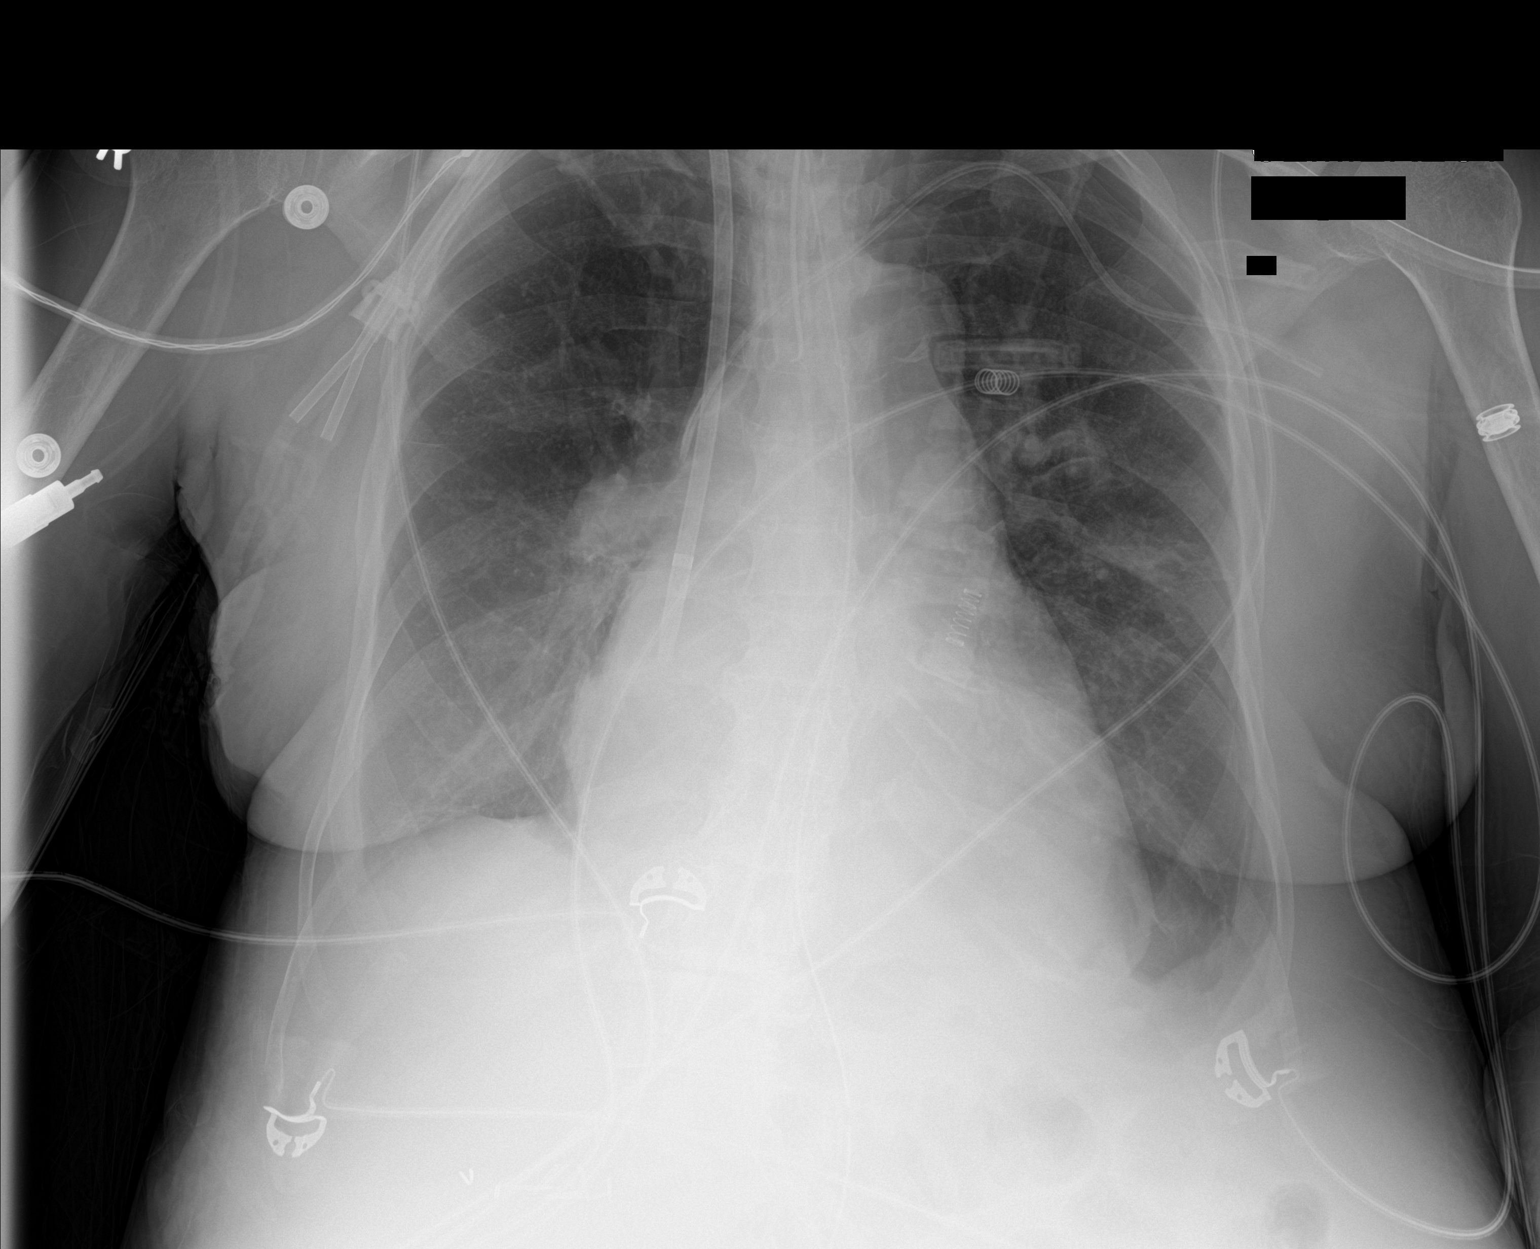

[1 of 1 positions shown; findings below may reference images not displayed]

FINDINGS: Endotracheal tube, central venous catheters and NG tube all appear
in good position. Chronic cardiomegaly. Small bilateral pleural
effusions. Pulmonary vascularity is normal.
IMPRESSION: No significant change.  Small bilateral pleural effusions.
